# Patient Record
Sex: Female | Born: 1980 | Race: White | Hispanic: No | Marital: Single | Smoking: Never smoker
Health system: Southern US, Community
[De-identification: ages and names within clinical notes are randomized; demographics above are authoritative.]

## PROBLEM LIST (undated history)

## (undated) DIAGNOSIS — N200 Calculus of kidney: Secondary | ICD-10-CM

## (undated) DIAGNOSIS — T7840XA Allergy, unspecified, initial encounter: Secondary | ICD-10-CM

## (undated) DIAGNOSIS — N644 Mastodynia: Secondary | ICD-10-CM

## (undated) DIAGNOSIS — M797 Fibromyalgia: Secondary | ICD-10-CM

## (undated) DIAGNOSIS — F32A Depression, unspecified: Secondary | ICD-10-CM

## (undated) DIAGNOSIS — I82409 Acute embolism and thrombosis of unspecified deep veins of unspecified lower extremity: Secondary | ICD-10-CM

## (undated) DIAGNOSIS — G9050 Complex regional pain syndrome I, unspecified: Secondary | ICD-10-CM

## (undated) DIAGNOSIS — K859 Acute pancreatitis without necrosis or infection, unspecified: Secondary | ICD-10-CM

## (undated) DIAGNOSIS — F329 Major depressive disorder, single episode, unspecified: Secondary | ICD-10-CM

## (undated) DIAGNOSIS — M1990 Unspecified osteoarthritis, unspecified site: Secondary | ICD-10-CM

## (undated) DIAGNOSIS — F419 Anxiety disorder, unspecified: Secondary | ICD-10-CM

## (undated) DIAGNOSIS — R87612 Low grade squamous intraepithelial lesion on cytologic smear of cervix (LGSIL): Secondary | ICD-10-CM

## (undated) DIAGNOSIS — J45909 Unspecified asthma, uncomplicated: Secondary | ICD-10-CM

## (undated) DIAGNOSIS — K863 Pseudocyst of pancreas: Secondary | ICD-10-CM

## (undated) DIAGNOSIS — G709 Myoneural disorder, unspecified: Secondary | ICD-10-CM

## (undated) DIAGNOSIS — G43909 Migraine, unspecified, not intractable, without status migrainosus: Secondary | ICD-10-CM

## (undated) HISTORY — DX: Depression, unspecified: F32.A

## (undated) HISTORY — DX: Low grade squamous intraepithelial lesion on cytologic smear of cervix (LGSIL): R87.612

## (undated) HISTORY — PX: UPPER GASTROINTESTINAL ENDOSCOPY: SHX188

## (undated) HISTORY — DX: Myoneural disorder, unspecified: G70.9

## (undated) HISTORY — PX: OTHER SURGICAL HISTORY: SHX169

## (undated) HISTORY — PX: KNEE ARTHROSCOPY: SUR90

## (undated) HISTORY — PX: ERCP: SHX60

## (undated) HISTORY — PX: UPPER ESOPHAGEAL ENDOSCOPIC ULTRASOUND (EUS): SHX6562

## (undated) HISTORY — DX: Major depressive disorder, single episode, unspecified: F32.9

## (undated) HISTORY — DX: Allergy, unspecified, initial encounter: T78.40XA

## (undated) HISTORY — DX: Anxiety disorder, unspecified: F41.9

## (undated) HISTORY — PX: GASTROSTOMY-JEJEUNOSTOMY TUBE CHANGE/PLACEMENT: SHX1705

## (undated) HISTORY — DX: Migraine, unspecified, not intractable, without status migrainosus: G43.909

## (undated) HISTORY — DX: Unspecified asthma, uncomplicated: J45.909

## (undated) HISTORY — DX: Unspecified osteoarthritis, unspecified site: M19.90

## (undated) HISTORY — DX: Acute embolism and thrombosis of unspecified deep veins of unspecified lower extremity: I82.409

## (undated) HISTORY — PX: TONSILLECTOMY: SUR1361

---

## 2005-09-04 DIAGNOSIS — R87612 Low grade squamous intraepithelial lesion on cytologic smear of cervix (LGSIL): Secondary | ICD-10-CM

## 2005-09-04 HISTORY — DX: Low grade squamous intraepithelial lesion on cytologic smear of cervix (LGSIL): R87.612

## 2007-01-03 ENCOUNTER — Ambulatory Visit: Payer: Self-pay | Admitting: Otolaryngology

## 2007-01-25 ENCOUNTER — Emergency Department: Payer: Self-pay | Admitting: Emergency Medicine

## 2007-04-30 ENCOUNTER — Ambulatory Visit: Payer: Self-pay | Admitting: Internal Medicine

## 2007-07-12 ENCOUNTER — Other Ambulatory Visit: Payer: Self-pay

## 2007-07-12 ENCOUNTER — Emergency Department: Payer: Self-pay | Admitting: Unknown Physician Specialty

## 2007-08-27 ENCOUNTER — Ambulatory Visit: Payer: Self-pay | Admitting: Orthopaedic Surgery

## 2007-09-03 ENCOUNTER — Ambulatory Visit: Payer: Self-pay | Admitting: Orthopaedic Surgery

## 2007-10-30 ENCOUNTER — Ambulatory Visit: Payer: Self-pay | Admitting: Family Medicine

## 2007-11-03 ENCOUNTER — Ambulatory Visit: Payer: Self-pay | Admitting: Family Medicine

## 2007-12-04 ENCOUNTER — Ambulatory Visit: Payer: Self-pay | Admitting: Family Medicine

## 2008-01-03 ENCOUNTER — Ambulatory Visit: Payer: Self-pay | Admitting: Family Medicine

## 2008-02-03 ENCOUNTER — Ambulatory Visit: Payer: Self-pay | Admitting: Family Medicine

## 2008-02-19 ENCOUNTER — Ambulatory Visit: Payer: Self-pay | Admitting: Family Medicine

## 2008-03-04 ENCOUNTER — Ambulatory Visit: Payer: Self-pay | Admitting: Family Medicine

## 2008-03-04 ENCOUNTER — Ambulatory Visit: Payer: Self-pay | Admitting: Internal Medicine

## 2008-03-18 ENCOUNTER — Ambulatory Visit: Payer: Self-pay | Admitting: Internal Medicine

## 2008-04-04 ENCOUNTER — Ambulatory Visit: Payer: Self-pay | Admitting: Family Medicine

## 2008-04-04 ENCOUNTER — Ambulatory Visit: Payer: Self-pay | Admitting: Internal Medicine

## 2008-05-05 ENCOUNTER — Ambulatory Visit: Payer: Self-pay | Admitting: Internal Medicine

## 2008-06-21 ENCOUNTER — Ambulatory Visit: Payer: Self-pay | Admitting: Family Medicine

## 2008-08-24 ENCOUNTER — Ambulatory Visit: Payer: Self-pay | Admitting: Podiatry

## 2008-09-09 ENCOUNTER — Encounter: Payer: Self-pay | Admitting: Podiatry

## 2008-10-05 ENCOUNTER — Encounter: Payer: Self-pay | Admitting: Podiatry

## 2008-10-05 ENCOUNTER — Ambulatory Visit: Payer: Self-pay | Admitting: Internal Medicine

## 2008-10-13 ENCOUNTER — Ambulatory Visit: Payer: Self-pay | Admitting: Internal Medicine

## 2008-10-19 ENCOUNTER — Ambulatory Visit: Payer: Self-pay | Admitting: Unknown Physician Specialty

## 2008-11-02 ENCOUNTER — Ambulatory Visit: Payer: Self-pay | Admitting: Internal Medicine

## 2008-11-03 ENCOUNTER — Ambulatory Visit: Payer: Self-pay | Admitting: Rheumatology

## 2009-01-07 ENCOUNTER — Ambulatory Visit: Payer: Self-pay | Admitting: Neurology

## 2009-02-02 ENCOUNTER — Ambulatory Visit: Payer: Self-pay | Admitting: Internal Medicine

## 2009-02-19 ENCOUNTER — Ambulatory Visit: Payer: Self-pay | Admitting: Internal Medicine

## 2009-03-03 ENCOUNTER — Ambulatory Visit: Payer: Self-pay | Admitting: Specialist

## 2009-03-04 ENCOUNTER — Ambulatory Visit: Payer: Self-pay | Admitting: Internal Medicine

## 2009-05-05 ENCOUNTER — Ambulatory Visit: Payer: Self-pay | Admitting: Internal Medicine

## 2009-06-03 ENCOUNTER — Ambulatory Visit: Payer: Self-pay | Admitting: Internal Medicine

## 2009-06-04 ENCOUNTER — Ambulatory Visit: Payer: Self-pay | Admitting: Internal Medicine

## 2009-07-05 ENCOUNTER — Ambulatory Visit: Payer: Self-pay | Admitting: Internal Medicine

## 2009-08-04 ENCOUNTER — Ambulatory Visit: Payer: Self-pay | Admitting: Internal Medicine

## 2009-09-02 ENCOUNTER — Ambulatory Visit: Payer: Self-pay | Admitting: Internal Medicine

## 2009-09-04 ENCOUNTER — Ambulatory Visit: Payer: Self-pay | Admitting: Internal Medicine

## 2009-11-30 ENCOUNTER — Ambulatory Visit: Payer: Self-pay

## 2009-12-08 ENCOUNTER — Encounter: Payer: Self-pay | Admitting: Internal Medicine

## 2010-01-02 ENCOUNTER — Encounter: Payer: Self-pay | Admitting: Internal Medicine

## 2010-01-04 ENCOUNTER — Ambulatory Visit: Payer: Self-pay | Admitting: Internal Medicine

## 2010-02-02 ENCOUNTER — Encounter: Payer: Self-pay | Admitting: Internal Medicine

## 2010-03-04 ENCOUNTER — Ambulatory Visit: Payer: Self-pay | Admitting: Internal Medicine

## 2010-03-04 ENCOUNTER — Encounter: Payer: Self-pay | Admitting: Internal Medicine

## 2010-03-16 ENCOUNTER — Ambulatory Visit: Payer: Self-pay | Admitting: Internal Medicine

## 2010-04-04 ENCOUNTER — Ambulatory Visit: Payer: Self-pay | Admitting: Internal Medicine

## 2010-04-04 ENCOUNTER — Encounter: Payer: Self-pay | Admitting: Internal Medicine

## 2010-04-21 ENCOUNTER — Ambulatory Visit: Payer: Self-pay | Admitting: Internal Medicine

## 2010-05-02 ENCOUNTER — Encounter: Payer: Self-pay | Admitting: Internal Medicine

## 2010-05-05 ENCOUNTER — Encounter: Payer: Self-pay | Admitting: Internal Medicine

## 2010-06-02 ENCOUNTER — Ambulatory Visit: Payer: Self-pay | Admitting: Internal Medicine

## 2010-08-18 ENCOUNTER — Ambulatory Visit: Payer: Self-pay | Admitting: Internal Medicine

## 2010-10-04 ENCOUNTER — Encounter: Payer: Self-pay | Admitting: Internal Medicine

## 2010-10-05 ENCOUNTER — Encounter: Payer: Self-pay | Admitting: Internal Medicine

## 2010-10-19 ENCOUNTER — Ambulatory Visit: Payer: Self-pay | Admitting: Internal Medicine

## 2010-11-03 ENCOUNTER — Ambulatory Visit: Payer: Self-pay | Admitting: Internal Medicine

## 2010-11-03 ENCOUNTER — Encounter: Payer: Self-pay | Admitting: Internal Medicine

## 2011-01-16 ENCOUNTER — Ambulatory Visit: Payer: Self-pay | Admitting: Family Medicine

## 2011-02-01 ENCOUNTER — Ambulatory Visit: Payer: Self-pay | Admitting: Family

## 2011-02-10 ENCOUNTER — Ambulatory Visit: Payer: Self-pay | Admitting: Internal Medicine

## 2011-02-13 ENCOUNTER — Ambulatory Visit: Payer: Self-pay | Admitting: Internal Medicine

## 2011-03-08 ENCOUNTER — Ambulatory Visit: Payer: Self-pay | Admitting: Family Medicine

## 2011-05-17 ENCOUNTER — Ambulatory Visit: Payer: Self-pay | Admitting: Family Medicine

## 2011-09-18 DIAGNOSIS — M545 Low back pain, unspecified: Secondary | ICD-10-CM | POA: Insufficient documentation

## 2011-09-18 DIAGNOSIS — M797 Fibromyalgia: Secondary | ICD-10-CM | POA: Insufficient documentation

## 2011-10-02 ENCOUNTER — Ambulatory Visit: Payer: Self-pay | Admitting: Internal Medicine

## 2011-10-02 LAB — URINALYSIS, COMPLETE
Bilirubin,UR: NEGATIVE
Blood: NEGATIVE
Glucose,UR: NEGATIVE mg/dL (ref 0–75)
Leukocyte Esterase: NEGATIVE
Nitrite: NEGATIVE
Protein: NEGATIVE
RBC,UR: NONE SEEN /HPF (ref 0–5)
Specific Gravity: >=1.03 (ref 1.003–1.030)

## 2011-10-04 LAB — URINE CULTURE

## 2011-11-07 ENCOUNTER — Ambulatory Visit: Payer: Self-pay | Admitting: Family Medicine

## 2011-11-30 ENCOUNTER — Ambulatory Visit: Payer: Self-pay | Admitting: Internal Medicine

## 2011-11-30 LAB — CBC WITH DIFFERENTIAL/PLATELET
Basophil #: 0.1 10*3/uL (ref 0.0–0.1)
Basophil %: 0.5 %
HGB: 12.7 g/dL (ref 12.0–16.0)
Monocyte #: 0.6 10*3/uL (ref 0.0–0.7)
Platelet: 392 10*3/uL (ref 150–440)
RBC: 4.12 10*6/uL (ref 3.80–5.20)
RDW: 12.4 % (ref 11.5–14.5)

## 2011-11-30 LAB — CREATININE, SERUM
Creatinine: 0.71 mg/dL (ref 0.60–1.30)
EGFR (Non-African Amer.): >60

## 2011-12-01 DIAGNOSIS — M2550 Pain in unspecified joint: Secondary | ICD-10-CM | POA: Insufficient documentation

## 2012-09-12 ENCOUNTER — Emergency Department: Payer: Self-pay | Admitting: Emergency Medicine

## 2012-09-12 LAB — BASIC METABOLIC PANEL
Calcium, Total: 8.5 mg/dL (ref 8.5–10.1)
Co2: 19 mmol/L — ABNORMAL LOW (ref 21–32)
Creatinine: 0.67 mg/dL (ref 0.60–1.30)
EGFR (Non-African Amer.): >60
Glucose: 91 mg/dL (ref 65–99)
Osmolality: 279 (ref 275–301)
Potassium: 4.1 mmol/L (ref 3.5–5.1)

## 2012-09-12 LAB — CBC
HCT: 38.7 % (ref 35.0–47.0)
MCHC: 32.4 g/dL (ref 32.0–36.0)
MCV: 94 fL (ref 80–100)
RDW: 13.3 % (ref 11.5–14.5)

## 2012-09-17 ENCOUNTER — Emergency Department: Payer: Self-pay | Admitting: Emergency Medicine

## 2012-09-17 LAB — URINALYSIS, COMPLETE
Bacteria: NONE SEEN
Bilirubin,UR: NEGATIVE
Blood: NEGATIVE
Glucose,UR: NEGATIVE mg/dL (ref 0–75)
Hyaline Cast: 1
Nitrite: NEGATIVE
Protein: NEGATIVE
RBC,UR: 1 /HPF (ref 0–5)
Specific Gravity: 1.021 (ref 1.003–1.030)
Squamous Epithelial: 1

## 2012-09-17 LAB — COMPREHENSIVE METABOLIC PANEL
Albumin: 3.8 g/dL (ref 3.4–5.0)
Anion Gap: 9 (ref 7–16)
BUN: 13 mg/dL (ref 7–18)
Calcium, Total: 8.9 mg/dL (ref 8.5–10.1)
Chloride: 110 mmol/L — ABNORMAL HIGH (ref 98–107)
Co2: 20 mmol/L — ABNORMAL LOW (ref 21–32)
Osmolality: 277 (ref 275–301)
Potassium: 4 mmol/L (ref 3.5–5.1)
SGOT(AST): 7 U/L — ABNORMAL LOW (ref 15–37)
SGPT (ALT): 15 U/L (ref 12–78)
Sodium: 139 mmol/L (ref 136–145)
Total Protein: 7.9 g/dL (ref 6.4–8.2)

## 2012-09-17 LAB — LIPASE, BLOOD: Lipase: 119 U/L (ref 73–393)

## 2013-07-12 ENCOUNTER — Emergency Department: Payer: Self-pay | Admitting: Emergency Medicine

## 2013-07-12 LAB — URINALYSIS, COMPLETE
Bilirubin,UR: NEGATIVE
Blood: NEGATIVE
Glucose,UR: NEGATIVE mg/dL (ref 0–75)
Ketone: NEGATIVE
Protein: NEGATIVE
RBC,UR: 1 /HPF (ref 0–5)

## 2013-07-12 LAB — BASIC METABOLIC PANEL
BUN: 11 mg/dL (ref 7–18)
Calcium, Total: 8.7 mg/dL (ref 8.5–10.1)
Co2: 21 mmol/L (ref 21–32)
Creatinine: 0.68 mg/dL (ref 0.60–1.30)
EGFR (African American): >60
EGFR (Non-African Amer.): >60
Glucose: 92 mg/dL (ref 65–99)
Osmolality: 277 (ref 275–301)
Sodium: 139 mmol/L (ref 136–145)

## 2013-07-12 LAB — CBC
HCT: 38.2 % (ref 35.0–47.0)
MCH: 31.6 pg (ref 26.0–34.0)
Platelet: 325 10*3/uL (ref 150–440)
RDW: 13.7 % (ref 11.5–14.5)

## 2013-08-21 ENCOUNTER — Emergency Department: Payer: Self-pay | Admitting: Emergency Medicine

## 2013-08-28 ENCOUNTER — Emergency Department: Payer: Self-pay | Admitting: Internal Medicine

## 2013-09-18 ENCOUNTER — Emergency Department: Payer: Self-pay | Admitting: Emergency Medicine

## 2014-01-15 ENCOUNTER — Emergency Department: Payer: Self-pay | Admitting: Emergency Medicine

## 2014-01-15 LAB — BASIC METABOLIC PANEL
Anion Gap: 8 (ref 7–16)
BUN: 12 mg/dL (ref 7–18)
CALCIUM: 8.8 mg/dL (ref 8.5–10.1)
CHLORIDE: 107 mmol/L (ref 98–107)
Co2: 24 mmol/L (ref 21–32)
Creatinine: 0.72 mg/dL (ref 0.60–1.30)
EGFR (Non-African Amer.): >60
GLUCOSE: 117 mg/dL — AB (ref 65–99)
Osmolality: 278 (ref 275–301)
POTASSIUM: 4 mmol/L (ref 3.5–5.1)
Sodium: 139 mmol/L (ref 136–145)

## 2014-01-15 LAB — CBC
HCT: 36.7 % (ref 35.0–47.0)
HGB: 12.3 g/dL (ref 12.0–16.0)
MCH: 31.3 pg (ref 26.0–34.0)
MCHC: 33.5 g/dL (ref 32.0–36.0)
MCV: 93 fL (ref 80–100)
Platelet: 360 10*3/uL (ref 150–440)
RBC: 3.94 10*6/uL (ref 3.80–5.20)
RDW: 12.7 % (ref 11.5–14.5)
WBC: 16.7 10*3/uL — ABNORMAL HIGH (ref 3.6–11.0)

## 2014-01-15 LAB — TROPONIN I

## 2014-02-04 ENCOUNTER — Emergency Department: Payer: Self-pay | Admitting: Emergency Medicine

## 2014-06-11 ENCOUNTER — Ambulatory Visit: Payer: Self-pay

## 2014-09-03 ENCOUNTER — Emergency Department: Payer: Self-pay | Admitting: Emergency Medicine

## 2014-09-03 LAB — URINALYSIS, COMPLETE
BILIRUBIN, UR: NEGATIVE
BLOOD: NEGATIVE
Bacteria: NONE SEEN
Glucose,UR: NEGATIVE mg/dL (ref 0–75)
KETONE: NEGATIVE
LEUKOCYTE ESTERASE: NEGATIVE
NITRITE: NEGATIVE
Ph: 5 (ref 4.5–8.0)
Protein: 30
Specific Gravity: 1.026 (ref 1.003–1.030)
Squamous Epithelial: 6
WBC UR: 6 /HPF (ref 0–5)

## 2014-09-03 LAB — COMPREHENSIVE METABOLIC PANEL
ALK PHOS: 88 U/L
AST: 9 U/L — AB (ref 15–37)
Albumin: 4 g/dL (ref 3.4–5.0)
Anion Gap: 10 (ref 7–16)
BILIRUBIN TOTAL: 0.3 mg/dL (ref 0.2–1.0)
BUN: 14 mg/dL (ref 7–18)
CHLORIDE: 105 mmol/L (ref 98–107)
Calcium, Total: 8.4 mg/dL — ABNORMAL LOW (ref 8.5–10.1)
Co2: 23 mmol/L (ref 21–32)
Creatinine: 0.81 mg/dL (ref 0.60–1.30)
EGFR (African American): >60
EGFR (Non-African Amer.): >60
GLUCOSE: 108 mg/dL — AB (ref 65–99)
OSMOLALITY: 277 (ref 275–301)
Potassium: 3.8 mmol/L (ref 3.5–5.1)
SGPT (ALT): 20 U/L
Sodium: 138 mmol/L (ref 136–145)
Total Protein: 8.1 g/dL (ref 6.4–8.2)

## 2014-09-03 LAB — CBC WITH DIFFERENTIAL/PLATELET
BASOS PCT: 0.6 %
Basophil #: 0.1 10*3/uL (ref 0.0–0.1)
Eosinophil #: 0.3 10*3/uL (ref 0.0–0.7)
Eosinophil %: 1.9 %
HCT: 40.4 % (ref 35.0–47.0)
HGB: 13.3 g/dL (ref 12.0–16.0)
LYMPHS ABS: 3.7 10*3/uL — AB (ref 1.0–3.6)
LYMPHS PCT: 20.8 %
MCH: 31 pg (ref 26.0–34.0)
MCHC: 32.9 g/dL (ref 32.0–36.0)
MCV: 94 fL (ref 80–100)
MONOS PCT: 5.6 %
Monocyte #: 1 x10 3/mm — ABNORMAL HIGH (ref 0.2–0.9)
NEUTROS ABS: 12.7 10*3/uL — AB (ref 1.4–6.5)
Neutrophil %: 71.1 %
Platelet: 413 10*3/uL (ref 150–440)
RBC: 4.29 10*6/uL (ref 3.80–5.20)
RDW: 12.7 % (ref 11.5–14.5)
WBC: 17.8 10*3/uL — ABNORMAL HIGH (ref 3.6–11.0)

## 2014-09-03 LAB — PREGNANCY, URINE: PREGNANCY TEST, URINE: NEGATIVE m[IU]/mL

## 2014-09-03 LAB — LIPASE, BLOOD: Lipase: 172 U/L (ref 73–393)

## 2015-05-21 DIAGNOSIS — J45909 Unspecified asthma, uncomplicated: Secondary | ICD-10-CM | POA: Insufficient documentation

## 2015-07-20 ENCOUNTER — Other Ambulatory Visit: Payer: Self-pay | Admitting: Adult Health

## 2015-07-20 DIAGNOSIS — N644 Mastodynia: Secondary | ICD-10-CM

## 2015-07-23 ENCOUNTER — Ambulatory Visit
Admission: RE | Admit: 2015-07-23 | Discharge: 2015-07-23 | Disposition: A | Discharge status: Home / Self Care | Payer: Self-pay | Source: Ambulatory Visit | Attending: Family Medicine | Admitting: Family Medicine

## 2015-07-23 DIAGNOSIS — N644 Mastodynia: Secondary | ICD-10-CM

## 2015-07-23 HISTORY — DX: Mastodynia: N64.4

## 2015-08-25 ENCOUNTER — Emergency Department
Admission: EM | Admit: 2015-08-25 | Discharge: 2015-08-25 | Disposition: A | Discharge status: Home / Self Care | Payer: Self-pay | Attending: Emergency Medicine | Admitting: Emergency Medicine

## 2015-08-25 DIAGNOSIS — Z3202 Encounter for pregnancy test, result negative: Secondary | ICD-10-CM | POA: Insufficient documentation

## 2015-08-25 DIAGNOSIS — Z880 Allergy status to penicillin: Secondary | ICD-10-CM | POA: Insufficient documentation

## 2015-08-25 DIAGNOSIS — R197 Diarrhea, unspecified: Secondary | ICD-10-CM

## 2015-08-25 DIAGNOSIS — A084 Viral intestinal infection, unspecified: Secondary | ICD-10-CM | POA: Insufficient documentation

## 2015-08-25 LAB — URINALYSIS COMPLETE WITH MICROSCOPIC (ARMC ONLY)
BILIRUBIN URINE: NEGATIVE
Bacteria, UA: NONE SEEN
GLUCOSE, UA: NEGATIVE mg/dL
Hgb urine dipstick: NEGATIVE
Ketones, ur: NEGATIVE mg/dL
Nitrite: NEGATIVE
Protein, ur: NEGATIVE mg/dL
Specific Gravity, Urine: 1.024 (ref 1.005–1.030)
pH: 5 (ref 5.0–8.0)

## 2015-08-25 LAB — CBC
HCT: 38.6 % (ref 35.0–47.0)
Hemoglobin: 12.6 g/dL (ref 12.0–16.0)
MCH: 30.3 pg (ref 26.0–34.0)
MCHC: 32.7 g/dL (ref 32.0–36.0)
MCV: 92.6 fL (ref 80.0–100.0)
PLATELETS: 377 10*3/uL (ref 150–440)
RBC: 4.17 MIL/uL (ref 3.80–5.20)
RDW: 13.3 % (ref 11.5–14.5)
WBC: 14.3 10*3/uL — ABNORMAL HIGH (ref 3.6–11.0)

## 2015-08-25 LAB — COMPREHENSIVE METABOLIC PANEL
ALBUMIN: 4.1 g/dL (ref 3.5–5.0)
ALT: 17 U/L (ref 14–54)
AST: 15 U/L (ref 15–41)
Alkaline Phosphatase: 76 U/L (ref 38–126)
Anion gap: 5 (ref 5–15)
BUN: 13 mg/dL (ref 6–20)
CALCIUM: 9 mg/dL (ref 8.9–10.3)
CO2: 21 mmol/L — AB (ref 22–32)
CREATININE: 0.62 mg/dL (ref 0.44–1.00)
Chloride: 110 mmol/L (ref 101–111)
GFR calc non Af Amer: >60 mL/min (ref >60)
Glucose, Bld: 90 mg/dL (ref 65–99)
Potassium: 3.7 mmol/L (ref 3.5–5.1)
SODIUM: 136 mmol/L (ref 135–145)
Total Bilirubin: 0.3 mg/dL (ref 0.3–1.2)
Total Protein: 7.6 g/dL (ref 6.5–8.1)

## 2015-08-25 LAB — POCT PREGNANCY, URINE: PREG TEST UR: NEGATIVE

## 2015-08-25 LAB — LIPASE, BLOOD: LIPASE: 29 U/L (ref 11–51)

## 2015-08-25 MED ORDER — DICYCLOMINE HCL 20 MG PO TABS: 20.0000 mg | ORAL_TABLET | Freq: Three times a day (TID) | ORAL | Status: DC | PRN

## 2015-08-25 MED ORDER — ONDANSETRON 8 MG PO TBDP: 8.0000 mg | ORAL_TABLET | Freq: Three times a day (TID) | ORAL | Status: DC | PRN

## 2015-08-25 MED ORDER — RANITIDINE HCL 150 MG PO CAPS: 150.0000 mg | ORAL_CAPSULE | Freq: Two times a day (BID) | ORAL | Status: DC

## 2015-08-25 NOTE — ED Notes (Signed)
Pt c/o watery diarrhea since Saturday night with whole left side abd cramping.

## 2015-08-25 NOTE — Discharge Instructions (Signed)
Diarrhea Diarrhea is frequent loose and watery bowel movements. It can cause you to feel weak and dehydrated. Dehydration can cause you to become tired and thirsty, have a dry mouth, and have decreased urination that often is dark yellow. Diarrhea is a sign of another problem, most often an infection that will not last long. In most cases, diarrhea typically lasts 2-3 days. However, it can last longer if it is a sign of something more serious. It is important to treat your diarrhea as directed by your caregiver to lessen or prevent future episodes of diarrhea. CAUSES  Some common causes include:  Gastrointestinal infections caused by viruses, bacteria, or parasites.  Food poisoning or food allergies.  Certain medicines, such as antibiotics, chemotherapy, and laxatives.  Artificial sweeteners and fructose.  Digestive disorders. HOME CARE INSTRUCTIONS  Ensure adequate fluid intake (hydration): Have 1 cup (8 oz) of fluid for each diarrhea episode. Avoid fluids that contain simple sugars or sports drinks, fruit juices, whole milk products, and sodas. Your urine should be clear or pale yellow if you are drinking enough fluids. Hydrate with an oral rehydration solution that you can purchase at pharmacies, retail stores, and online. You can prepare an oral rehydration solution at home by mixing the following ingredients together:   - tsp table salt.   tsp baking soda.   tsp salt substitute containing potassium chloride.  1  tablespoons sugar.  1 L (34 oz) of water.  Certain foods and beverages may increase the speed at which food moves through the gastrointestinal (GI) tract. These foods and beverages should be avoided and include:  Caffeinated and alcoholic beverages.  High-fiber foods, such as raw fruits and vegetables, nuts, seeds, and whole grain breads and cereals.  Foods and beverages sweetened with sugar alcohols, such as xylitol, sorbitol, and mannitol.  Some foods may be well  tolerated and may help thicken stool including:  Starchy foods, such as rice, toast, pasta, low-sugar cereal, oatmeal, grits, baked potatoes, crackers, and bagels.  Bananas.  Applesauce.  Add probiotic-rich foods to help increase healthy bacteria in the GI tract, such as yogurt and fermented milk products.  Wash your hands well after each diarrhea episode.  Only take over-the-counter or prescription medicines as directed by your caregiver.  Take a warm bath to relieve any burning or pain from frequent diarrhea episodes. SEEK IMMEDIATE MEDICAL CARE IF:   You are unable to keep fluids down.  You have persistent vomiting.  You have blood in your stool, or your stools are black and tarry.  You do not urinate in 6-8 hours, or there is only a small amount of very dark urine.  You have abdominal pain that increases or localizes.  You have weakness, dizziness, confusion, or light-headedness.  You have a severe headache.  Your diarrhea gets worse or does not get better.  You have a fever or persistent symptoms for more than 2-3 days.  You have a fever and your symptoms suddenly get worse. MAKE SURE YOU:   Understand these instructions.  Will watch your condition.  Will get help right away if you are not doing well or get worse.   This information is not intended to replace advice given to you by your health care provider. Make sure you discuss any questions you have with your health care provider.   Document Released: 08/11/2002 Document Revised: 09/11/2014 Document Reviewed: 04/28/2012 Elsevier Interactive Patient Education 2016 [employer] Inc[employer]  Viral Gastroenteritis Viral gastroenteritis is also known as stomach flu. This  condition affects the stomach and intestinal tract. It can cause sudden diarrhea and vomiting. The illness typically lasts 3 to 8 days. Most people develop an immune response that eventually gets rid of the virus. While this natural response develops, the  virus can make you quite ill. CAUSES  Many different viruses can cause gastroenteritis, such as rotavirus or noroviruses. You can catch one of these viruses by consuming contaminated food or water. You may also catch a virus by sharing utensils or other personal items with an infected person or by touching a contaminated surface. SYMPTOMS  The most common symptoms are diarrhea and vomiting. These problems can cause a severe loss of body fluids (dehydration) and a body salt (electrolyte) imbalance. Other symptoms may include:  Fever.  Headache.  Fatigue.  Abdominal pain. DIAGNOSIS  Your caregiver can usually diagnose viral gastroenteritis based on your symptoms and a physical exam. A stool sample may also be taken to test for the presence of viruses or other infections. TREATMENT  This illness typically goes away on its own. Treatments are aimed at rehydration. The most serious cases of viral gastroenteritis involve vomiting so severely that you are not able to keep fluids down. In these cases, fluids must be given through an intravenous line (IV). HOME CARE INSTRUCTIONS   Drink enough fluids to keep your urine clear or pale yellow. Drink small amounts of fluids frequently and increase the amounts as tolerated.  Ask your caregiver for specific rehydration instructions.  Avoid:  Foods high in sugar.  Alcohol.  Carbonated drinks.  Tobacco.  Juice.  Caffeine drinks.  Extremely hot or cold fluids.  Fatty, greasy foods.  Too much intake of anything at one time.  Dairy products until 24 to 48 hours after diarrhea stops.  You may consume probiotics. Probiotics are active cultures of beneficial bacteria. They may lessen the amount and number of diarrheal stools in adults. Probiotics can be found in yogurt with active cultures and in supplements.  Wash your hands well to avoid spreading the virus.  Only take over-the-counter or prescription medicines for pain, discomfort, or  fever as directed by your caregiver. Do not give aspirin to children. Antidiarrheal medicines are not recommended.  Ask your caregiver if you should continue to take your regular prescribed and over-the-counter medicines.  Keep all follow-up appointments as directed by your caregiver. SEEK IMMEDIATE MEDICAL CARE IF:   You are unable to keep fluids down.  You do not urinate at least once every 6 to 8 hours.  You develop shortness of breath.  You notice blood in your stool or vomit. This may look like coffee grounds.  You have abdominal pain that increases or is concentrated in one small area (localized).  You have persistent vomiting or diarrhea.  You have a fever.  The patient is a child younger than 3 months, and he or she has a fever.  The patient is a child older than 3 months, and he or she has a fever and persistent symptoms.  The patient is a child older than 3 months, and he or she has a fever and symptoms suddenly get worse.  The patient is a baby, and he or she has no tears when crying. MAKE SURE YOU:   Understand these instructions.  Will watch your condition.  Will get help right away if you are not doing well or get worse.   This information is not intended to replace advice given to you by your health care provider. Make  sure you discuss any questions you have with your health care provider.   Document Released: 08/21/2005 Document Revised: 11/13/2011 Document Reviewed: 06/07/2011 Elsevier Interactive Patient Education [employer]

## 2015-08-25 NOTE — ED Provider Notes (Signed)
[facility] Emergency Department Provider Note  ____________________________________________  Time seen: 4:50 PM  I have reviewed the triage vital signs and the nursing notes.   HISTORY  Chief Complaint Diarrhea    HPI Kelsey Bell is a 34 y.o. female who complains of watery diarrhea for the past 5 days. Also generalized abdominal cramping worse on the left side. Has some nausea but no vomiting. No fevers chills chest pain shortness of breath. No significant travel or recent illness, no sick contacts. Has taken some antibiotics recently for an ear infection but last antibiotic use was more than a month ago. No recent hospitalizations.     Past Medical History  Diagnosis Date  . Breast pain present for several months    Bil LT >RT across of breasts     There are no active problems to display for this patient.    Past Surgical History  Procedure Laterality Date  . Tonsillectomy    . Knee arthroscopy       Current Outpatient Rx  Name  Route  Sig  Dispense  Refill  . dicyclomine (BENTYL) 20 MG tablet   Oral   Take 1 tablet (20 mg total) by mouth 3 (three) times daily as needed for spasms.   30 tablet   0   . ondansetron (ZOFRAN ODT) 8 MG disintegrating tablet   Oral   Take 1 tablet (8 mg total) by mouth every 8 (eight) hours as needed for nausea or vomiting.   20 tablet   0   . ranitidine (ZANTAC) 150 MG capsule   Oral   Take 1 capsule (150 mg total) by mouth 2 (two) times daily.   28 capsule   0      Allergies Esomeprazole; Hydroxychloroquine; Cefdinir; Prednisone; Amoxicillin; and Sulfa antibiotics   No family history on file.  Social History Social History  Substance Use Topics  . Smoking status: Never Smoker   . Smokeless tobacco: None  . Alcohol Use: No    Review of Systems  Constitutional:   No fever or chills. No weight changes Eyes:   No blurry vision or double vision.  ENT:   No sore  throat. Cardiovascular:   No chest pain. Respiratory:   No dyspnea or cough. Gastrointestinal:   Positive for abdominal pain with diarrhea. No vomiting.Marland Kitchen  No BRBPR or melena. Genitourinary:   Negative for dysuria, urinary retention, bloody urine, or difficulty urinating. Musculoskeletal:   Negative for back pain. No joint swelling or pain. Skin:   Negative for rash. Neurological:   Negative for headaches, focal weakness or numbness. Psychiatric:  No anxiety or depression.   Endocrine:  No hot/cold intolerance, changes in energy, or sleep difficulty.  10-point ROS otherwise negative.  ____________________________________________   PHYSICAL EXAM:  VITAL SIGNS: ED Triage Vitals  Enc Vitals Group     BP 08/25/15 1619 123/74 mmHg     Pulse Rate 08/25/15 1619 88     Resp 08/25/15 1619 18     Temp 08/25/15 1619 98.1 F (36.7 C)     Temp Source 08/25/15 1619 Oral     SpO2 08/25/15 1619 98 %     Weight 08/25/15 1619 280 lb (127.007 kg)     Height 08/25/15 1619  (1.753 m)     Head Cir --      Peak Flow --      Pain Score 08/25/15 1620 6     Pain Loc --  Pain Edu? --      Excl. in GC? --     Vital signs reviewed, nursing assessments reviewed.   Constitutional:   Alert and oriented. Well appearing and in no distress. Eyes:   No scleral icterus. No conjunctival pallor. PERRL. EOMI ENT   Head:   Normocephalic and atraumatic.   Nose:   No congestion/rhinnorhea. No septal hematoma   Mouth/Throat:   MMM, no pharyngeal erythema. No peritonsillar mass. No uvula shift.   Neck:   No stridor. No SubQ emphysema. No meningismus. Hematological/Lymphatic/Immunilogical:   No cervical lymphadenopathy. Cardiovascular:   RRR. Normal and symmetric distal pulses are present in all extremities. No murmurs, rubs, or gallops. Respiratory:   Normal respiratory effort without tachypnea nor retractions. Breath sounds are clear and equal bilaterally. No  wheezes/rales/rhonchi. Gastrointestinal:   Soft with generalized mild tenderness, nontender on the right side. No distention. There is no CVA tenderness.  No rebound, rigidity, or guarding. Genitourinary:   deferred Musculoskeletal:   Nontender with normal range of motion in all extremities. No joint effusions.  No lower extremity tenderness.  No edema. Neurologic:   Normal speech and language.  CN 2-10 normal. Motor grossly intact. No pronator drift.  Normal gait. No gross focal neurologic deficits are appreciated.  Skin:    Skin is warm, dry and intact. No rash noted.  No petechiae, purpura, or bullae. Psychiatric:   Mood and affect are normal. Speech and behavior are normal. Patient exhibits appropriate insight and judgment.  ____________________________________________    LABS (pertinent positives/negatives) (all labs ordered are listed, but only abnormal results are displayed) Labs Reviewed  COMPREHENSIVE METABOLIC PANEL - Abnormal; Notable for the following:    CO2 21 (*)    All other components within normal limits  CBC - Abnormal; Notable for the following:    WBC 14.3 (*)    All other components within normal limits  URINALYSIS COMPLETEWITH MICROSCOPIC ([facility] ONLY) - Abnormal; Notable for the following:    Color, Urine YELLOW (*)    APPearance CLEAR (*)    Leukocytes, UA TRACE (*)    Squamous Epithelial / LPF 0-5 (*)    All other components within normal limits  LIPASE, BLOOD  POC URINE PREG, ED  POCT PREGNANCY, URINE   ____________________________________________   EKG    ____________________________________________    RADIOLOGY    ____________________________________________   PROCEDURES   ____________________________________________   INITIAL IMPRESSION / ASSESSMENT AND PLAN / ED COURSE  Pertinent labs & imaging results that were available during my care of the patient were reviewed by me and considered in my medical decision making (see chart  for details).  Patient presents with watery diarrhea, well-appearing nontoxic. Low suspicion for cholecystitis or appendicitis. No evidence of perforation or obstruction. Low suspicion for C. difficile. We will provide supportive care with an acid, antiemetics, antispasmodic. Focus on oral hydration, patient is tolerating oral intake and has Pedialyte at home that she bought for this. We'll discharge, follow-up with primary care.     ____________________________________________   FINAL CLINICAL IMPRESSION(S) / ED DIAGNOSES  Final diagnoses:  Diarrhea of presumed infectious origin  Viral enteritis      Sharman CheekPhillip Britley Gashi, MD 08/25/15 1728

## 2015-09-05 HISTORY — PX: COLPOSCOPY: SHX161

## 2016-02-22 ENCOUNTER — Ambulatory Visit: Payer: Worker's Compensation | Attending: Podiatry | Admitting: Physical Therapy

## 2016-02-22 DIAGNOSIS — M6281 Muscle weakness (generalized): Secondary | ICD-10-CM | POA: Diagnosis present

## 2016-02-22 DIAGNOSIS — M79672 Pain in left foot: Secondary | ICD-10-CM | POA: Diagnosis not present

## 2016-02-22 DIAGNOSIS — R262 Difficulty in walking, not elsewhere classified: Secondary | ICD-10-CM | POA: Diagnosis present

## 2016-02-22 DIAGNOSIS — M25572 Pain in left ankle and joints of left foot: Secondary | ICD-10-CM | POA: Insufficient documentation

## 2016-02-23 ENCOUNTER — Encounter: Payer: Self-pay | Admitting: Physical Therapy

## 2016-02-23 NOTE — Therapy (Signed)
[facility] [address] [place], [place], [address] Phone: [phone]   Fax:  [phone]  Physical Therapy Evaluation  Patient Details  Name: Kelsey Bell MRN: [id number] Date of Birth: [birth year] Referring Provider: Dr. Fransisca Kaufmann  Encounter Date: 02/22/2016      PT End of Session - 02/23/16 0955    Visit Number 1   Number of Visits 6   Date for PT Re-Evaluation 03/09/16   PT Start Time 1003   PT Stop Time 1111   PT Time Calculation (min) 68 min   Activity Tolerance Patient limited by pain   Behavior During Therapy [facility] for tasks assessed/performed      Past Medical History  Diagnosis Date  . Breast pain present for several months    Bil LT >RT across of breasts    Past Surgical History  Procedure Laterality Date  . Tonsillectomy    . Knee arthroscopy      There were no vitals filed for this visit.       Subjective Assessment - 02/23/16 0934    Subjective Pt. 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  Pt. states accident happened on 01/20/16 and she continued to work but pain became worse requiring her to go to Urgent Care the next day.  Pt. states X-ray was negative for fracture but she has nerve damage.  Pt. reports pain in L foot "feels like constant poking".  MD f/u with Dr. Orland Jarred is next Wednesday.     Limitations Standing;Walking;House hold activities;Other (comment)   Patient Stated Goals Increase L ankle/toe AROM and pain-free mobility to promote return to work.     Currently in Pain? Yes   Pain Score 8    Pain Location Foot   Pain Orientation Left   Pain Descriptors / Indicators Constant;Burning;Radiating;Stabbing   Pain Type Acute pain   Pain Onset More than a month ago   Pain Frequency Constant   Aggravating Factors  everything            [facility] PT Assessment - 02/23/16 0001    Assessment   Medical Diagnosis L foot pain   Referring Provider  Dr. Fransisca Kaufmann   Onset Date/Surgical Date 01/20/16   Next MD Visit --  03/01/16   Prior Therapy Pt. has not had PT for L foot.    Restrictions   Weight Bearing Restrictions No       Manual tx.: L ankle/toe AA/PROM (all planes of movement)- 8 min.  STM to L foot (as tolerated).  Biofreeze to L foot after tx.           PT Education - 02/23/16 0953    Education provided Yes   Education Details See HEP.  Encouraged pt. to stay active and move L toes/ankle.  PT recommended pt. to bring both sneakers next tx. session.    Person(s) Educated Patient   Methods Explanation;Demonstration;Handout   Comprehension Verbalized understanding;Returned demonstration;Need further instruction             PT Long Term Goals - 02/23/16 1008    PT LONG TERM GOAL #1   Title Pt. I with HEP to increase L ankle/toe AROM to WNL as compared to R ankle to improve pain-free mobility.     Baseline difficulty attempting to measure L ankle/toe due to c/o severe pain (pt. became very labile).     Time 4   Period Weeks  Status New   PT LONG TERM GOAL #2   Title Pt. will increase LEFS to >40 out of 80 to improve pain-free mobility/ return to work.     Baseline LEFS: 17 out of 80.     Time 4   Period Weeks   Status New   PT LONG TERM GOAL #3   Title Pt. able to ambulate 10 minutes with normalized gait pattern and use of sneakers with no limitations to promote improved mobility.     Baseline L CAM boot with moderate antalgic gait/ increase c/o pain.     Time 4   Period Weeks   Status New   PT LONG TERM GOAL #4   Title Pt. able to return to work with no L ankle pain/limitations.     Baseline currently out on work comp.     Time 4   Period Weeks   Status New               Plan - 02/23/16 0956    Clinical Impression Statement Pt. is a 35 y/o female with c/o L foot/toe pain after gallon of tea fell on foot on 01/20/16.  Pt. reprots 8/10 L foot/toe pain currently at rest prior to tx. session and  6/10 pain at best (meds./ ice).  Pt. presents to PT with use of L CAM walking boot and limited step pattern/ L knee flexion due to pain and fear of increase pain.  Increase c/o L hip/SI discomfort due to use of walking boot with standing/walking tasks.   Pt. presents with no swelling or bruising in L foot.  Pt. very hypersensitive with light palpation to L toes/foot (all bony landmarks).  Pt. guarded with all aspects of L toe/ankle PROM and unable to properly measure at this time.  LEFS: 17 out of 80.  Pt. will benefit from short-term PT to increase pain-free L ankle/toe AROM to promote improved standing tolerance/walking without assistive device.      Rehab Potential Good   PT Frequency 2x / week   PT Duration 3 weeks   PT Treatment/Interventions ADLs/Self Care Home Management;Aquatic Therapy;Cryotherapy;Iontophoresis 4mg /ml Dexamethasone;Ultrasound;Patient/family education;Neuromuscular re-education;Balance training;Therapeutic exercise;Therapeutic activities;Functional mobility training;Stair training;Gait training;Manual techniques;Passive range of motion;Dry needling   PT Next Visit Plan Increase L toe/ ankle AROM (all planes).  Progress out of walking boot into sneaker.     PT Home Exercise Plan See handouts.     Consulted and Agree with Plan of Care Patient      Patient will benefit from skilled therapeutic intervention in order to improve the following deficits and impairments:  Abnormal gait, Decreased strength, Increased muscle spasms, Postural dysfunction, Impaired perceived functional ability, Decreased activity tolerance, Decreased mobility, Impaired flexibility, Obesity, Decreased range of motion, Decreased balance, Pain, Decreased endurance  Visit Diagnosis: Pain in left foot  Pain in left ankle and joints of left foot  Difficulty in walking, not elsewhere classified  Muscle weakness (generalized)     Problem List There are no active problems to display for this  patient.  Cammie McgeeMichael C Kizzi Overbey, PT, DPT # [phone]   02/23/2016, 10:18 AM  [facility] [address] [place], [place], [address] Phone: [phone]   Fax:  [phone]  Name: Kelsey Bell MRN: [id number] Date of Birth: 10/19/1980

## 2016-02-24 ENCOUNTER — Encounter: Payer: Self-pay | Admitting: Physical Therapy

## 2016-02-24 ENCOUNTER — Ambulatory Visit: Payer: Worker's Compensation | Admitting: Physical Therapy

## 2016-02-24 DIAGNOSIS — R262 Difficulty in walking, not elsewhere classified: Secondary | ICD-10-CM

## 2016-02-24 DIAGNOSIS — M79672 Pain in left foot: Secondary | ICD-10-CM

## 2016-02-24 DIAGNOSIS — M25572 Pain in left ankle and joints of left foot: Secondary | ICD-10-CM

## 2016-02-24 DIAGNOSIS — M6281 Muscle weakness (generalized): Secondary | ICD-10-CM

## 2016-02-24 NOTE — Therapy (Signed)
[place] [facility] [address] [place], [place], [address] Phone: [phone]   Fax:  [phone]  Physical Therapy Treatment  Patient Details  Name: Kelsey Bell MRN: [id number] Date of Birth: 10/17/1980 Referring Provider: Dr. Fransisca Kaufmannroxlet  Encounter Date: 02/24/2016      PT End of Session - 02/24/16 1312    Visit Number 2   Number of Visits 6   Date for PT Re-Evaluation 03/16/16   PT Start Time 1007   PT Stop Time 1101   PT Time Calculation (min) 54 min   Activity Tolerance Patient limited by pain   Behavior During Therapy [facility] for tasks assessed/performed      Past Medical History  Diagnosis Date  . Breast pain present for several months    Bil LT >RT across of breasts    Past Surgical History  Procedure Laterality Date  . Tonsillectomy    . Knee arthroscopy      There were no vitals filed for this visit.      Subjective Assessment - 02/24/16 1257    Subjective Pt. states she was really hurting in L foot after initial evaluaiton.  Pt. presents to PT today in walking boot and moderate antalgic gait pattern.  Pt. states L foot pain travels up to L knee.  Pt. scheduled to have 2nd opinion with [place] in [place] next Wednesday.     Limitations Standing;Walking;House hold activities;Other (comment)   Patient Stated Goals Increase L ankle/toe AROM and pain-free mobility to promote return to work.     Currently in Pain? Yes   Pain Score 8    Pain Location Foot   Pain Orientation Left   Pain Descriptors / Indicators Constant;Burning;Radiating;Stabbing       OBJECTIVE:  There.ex.:  Reviewed HEP.  Manual tx.: supine L ankle subtalar grade I-II mobs. 3x20 sec. (discomfort with light hand placement).  L ankle DF/PF/IV/EV AA/PROM 5x each in pain tolerable range with static holds as tolerated.  STM to L foot/lower leg (gentle).  Gait training: amb. Short distances in clinic/ //-bars with cuing to increase step pattern/ stance phase of  gait on L and consistent heel strike and toe off. (very pain limited and hesitant to wt. Bear on L foot with and without UE assist.  Ice to L foot/lower leg in supine position after tx. Session.      Pt response for medical necessity: benefits from skilled PT to increase L ankle/foot/toe ROM and strengthening to improve pain-free mobility.  Persistent c/o L foot pain t/o tx. Session and pt. Able to wear sneaker and walk short distances.          PT Education - 02/23/16 0953    Education provided Yes   Education Details See HEP.  Encouraged pt. to stay active and move L toes/ankle.  PT recommended pt. to bring both sneakers next tx. session.    Person(s) Educated Patient   Methods Explanation;Demonstration;Handout   Comprehension Verbalized understanding;Returned demonstration;Need further instruction             PT Long Term Goals - 02/23/16 1008    PT LONG TERM GOAL #1   Title Pt. I with HEP to increase L ankle/toe AROM to WNL as compared to R ankle to improve pain-free mobility.     Baseline difficulty attempting to measure L ankle/toe due to c/o severe pain (pt. became very labile).     Time 4   Period Weeks   Status New  PT LONG TERM GOAL #2   Title Pt. will increase LEFS to >40 out of 80 to improve pain-free mobility/ return to work.     Baseline LEFS: 17 out of 80.     Time 4   Period Weeks   Status New   PT LONG TERM GOAL #3   Title Pt. able to ambulate 10 minutes with normalized gait pattern and use of sneakers with no limitations to promote improved mobility.     Baseline L CAM boot with moderate antalgic gait/ increase c/o pain.     Time 4   Period Weeks   Status New   PT LONG TERM GOAL #4   Title Pt. able to return to work with no L ankle pain/limitations.     Baseline currently out on work comp.     Time 4   Period Weeks   Status New             Plan - 02/24/16 1313    Clinical Impression Statement L foot/ankle presents with no signs of swelling or  bruising.  PT provided pt. with a lot of positive motivaiton due to fear of moving L foot/toes in supine position.  Increase ankle DF/ toe ext. AROM but pt. remains guarded/ pain focused with any attempts to streches/ passively move L ankle/toes.  Pt. able to don/doff sneaker on L and ambulate short distances in //-bars working on consistent heel stirke/ toe/ step pattern.  Pt. c/o high levels of pain t/o tx. session.     Rehab Potential Good   PT Frequency 2x / week   PT Duration 3 weeks   PT Treatment/Interventions ADLs/Self Care Home Management;Aquatic Therapy;Cryotherapy;Iontophoresis 4mg /ml Dexamethasone;Ultrasound;Patient/family education;Neuromuscular re-education;Balance training;Therapeutic exercise;Therapeutic activities;Functional mobility training;Stair training;Gait training;Manual techniques;Passive range of motion;Dry needling   PT Next Visit Plan Increase L toe/ ankle AROM (all planes).  Progress out of walking boot into sneaker.     PT Home Exercise Plan See handouts.        Patient will benefit from skilled therapeutic intervention in order to improve the following deficits and impairments:  Abnormal gait, Decreased strength, Increased muscle spasms, Postural dysfunction, Impaired perceived functional ability, Decreased activity tolerance, Decreased mobility, Impaired flexibility, Obesity, Decreased range of motion, Decreased balance, Pain, Decreased endurance  Visit Diagnosis: Pain in left foot  Pain in left ankle and joints of left foot  Difficulty in walking, not elsewhere classified  Muscle weakness (generalized)     Problem List There are no active problems to display for this patient.  Cammie McgeeMichael C Sherk, PT, DPT # [phone]   02/24/2016, 4:28 PM  [place] [facility] [address] [place], [place], [address] Phone: [phone]   Fax:  [phone]  Name: Kelsey Bell MRN: [id number] Date of Birth: 12/10/1980

## 2016-02-29 ENCOUNTER — Ambulatory Visit: Payer: Worker's Compensation | Admitting: Physical Therapy

## 2016-03-01 ENCOUNTER — Encounter: Payer: Self-pay | Admitting: Physical Therapy

## 2016-03-01 ENCOUNTER — Ambulatory Visit: Payer: Worker's Compensation | Admitting: Physical Therapy

## 2016-03-01 DIAGNOSIS — M6281 Muscle weakness (generalized): Secondary | ICD-10-CM

## 2016-03-01 DIAGNOSIS — M79672 Pain in left foot: Secondary | ICD-10-CM | POA: Diagnosis not present

## 2016-03-01 DIAGNOSIS — M25572 Pain in left ankle and joints of left foot: Secondary | ICD-10-CM

## 2016-03-01 DIAGNOSIS — R262 Difficulty in walking, not elsewhere classified: Secondary | ICD-10-CM

## 2016-03-01 NOTE — Therapy (Signed)
[place] [facility] [address] [place], [place], [address] Phone: [phone]   Fax:  [phone]  Physical Therapy Treatment  Patient Details  Name: Kelsey Bell MRN: [id number] Date of Birth: 04-13-1981 Referring Provider: Dr. Glynis Smiles  Encounter Date: 03/01/2016      PT End of Session - 03/01/16 1037    Visit Number 3   Number of Visits 6   Date for PT Re-Evaluation 03/16/16   PT Start Time 0945   PT Stop Time 1038   PT Time Calculation (min) 53 min   Activity Tolerance Patient limited by pain   Behavior During Therapy [facility] for tasks assessed/performed      Past Medical History  Diagnosis Date  . Breast pain present for several months    Bil LT >RT across of breasts    Past Surgical History  Procedure Laterality Date  . Tonsillectomy    . Knee arthroscopy      There were no vitals filed for this visit.      Subjective Assessment - 03/01/16 1033    Subjective Pt states that she is suffering from kidney infection and is currently taking percocet for pain which is helping her some with foot pain. She states she is not wearing the boot around the house but continues to wear it when she goes out in the public.   Limitations Standing;Walking;House hold activities;Other (comment)   Patient Stated Goals Increase L ankle/toe AROM and pain-free mobility to promote return to work.     Currently in Pain? Yes   Pain Score 6    Pain Location Foot   Pain Orientation Left   Pain Descriptors / Indicators Constant;Burning;Stabbing   Pain Type Acute pain   Pain Onset More than a month ago   Pain Frequency Constant      OBJECTIVE: There.ex.: Reviewed HEP; discussed addition of theraband for resisted AROM.AROM MTPs x15, AROM L ankle DF/PF/IV/EV as tolerated x15. Pt c/o pain with all planes of movement with limited range most notably in EV. Manual tx.: L ankle DF/PF/IV/EV AA/PROM 15x each in pain tolerable range with static holds as  tolerated.Pt with inconsistent complaints of pain over 4/5 MET head, medial arch and dorsum of foot with static holds. STM to L anterior tib; pt with pain sx from foot to just distal to knee along anterior tib muscle belly. Ice to L foot/lower leg in supine position after tx. Session.    Pt response for medical necessity: benefits from skilled PT to increase L ankle/foot/toe ROM and strengthening to improve pain-free mobility. Persistent c/o L foot pain t/o tx. Session.       PT Long Term Goals - 02/23/16 1008    PT LONG TERM GOAL #1   Title Pt. I with HEP to increase L ankle/toe AROM to WNL as compared to R ankle to improve pain-free mobility.     Baseline difficulty attempting to measure L ankle/toe due to c/o severe pain (pt. became very labile).     Time 4   Period Weeks   Status New   PT LONG TERM GOAL #2   Title Pt. will increase LEFS to >40 out of 80 to improve pain-free mobility/ return to work.     Baseline LEFS: 17 out of 80.     Time 4   Period Weeks   Status New   PT LONG TERM GOAL #3   Title Pt. able to ambulate 10 minutes with normalized gait pattern and  use of sneakers with no limitations to promote improved mobility.     Baseline L CAM boot with moderate antalgic gait/ increase c/o pain.     Time 4   Period Weeks   Status New   PT LONG TERM GOAL #4   Title Pt. able to return to work with no L ankle pain/limitations.     Baseline currently out on work comp.     Time 4   Period Weeks   Status New           Plan - 03/01/16 1105    Clinical Impression Statement Pt continues to be extremely guarded in toe/foot PROM/AAROM/AROM. She is hypersensitive to [facility] of L anterior tibialis and generalized sensitivity over dorsum/plantar surfaces of L foot. Pt with spontaneous movement of MTPs 1-5 during PROM/AAROM of LLE.   Rehab Potential Good   PT Frequency 2x / week   PT Duration 3 weeks   PT Treatment/Interventions ADLs/Self Care Home Management;Aquatic  Therapy;Cryotherapy;Iontophoresis 64m/ml Dexamethasone;Ultrasound;Patient/family education;Neuromuscular re-education;Balance training;Therapeutic exercise;Therapeutic activities;Functional mobility training;Stair training;Gait training;Manual techniques;Passive range of motion;Dry needling   PT Next Visit Plan Increase L toe/ ankle AROM (all planes).  Progress out of walking boot into sneaker.     PT Home Exercise Plan See handouts.     Consulted and Agree with Plan of Care Patient      Patient will benefit from skilled therapeutic intervention in order to improve the following deficits and impairments:  Abnormal gait, Decreased strength, Increased muscle spasms, Postural dysfunction, Impaired perceived functional ability, Decreased activity tolerance, Decreased mobility, Impaired flexibility, Obesity, Decreased range of motion, Decreased balance, Pain, Decreased endurance  Visit Diagnosis: Pain in left foot  Pain in left ankle and joints of left foot  Difficulty in walking, not elsewhere classified  Muscle weakness (generalized)     Problem List There are no active problems to display for this patient.  MPura Spice PT, DPT # [id number] Memo SPT  03/01/2016, 12:38 PM  [place] [facility] [address]Phone: [phone]  Fax:  [phone] Name: Kelsey KABLEMRN: [id number] of Birth: 106/01/1981

## 2016-03-02 ENCOUNTER — Ambulatory Visit: Payer: Worker's Compensation | Admitting: Physical Therapy

## 2016-03-02 ENCOUNTER — Encounter: Payer: Self-pay | Admitting: Physical Therapy

## 2016-03-02 DIAGNOSIS — M25572 Pain in left ankle and joints of left foot: Secondary | ICD-10-CM

## 2016-03-02 DIAGNOSIS — M79672 Pain in left foot: Secondary | ICD-10-CM | POA: Diagnosis not present

## 2016-03-02 DIAGNOSIS — R262 Difficulty in walking, not elsewhere classified: Secondary | ICD-10-CM

## 2016-03-02 DIAGNOSIS — M6281 Muscle weakness (generalized): Secondary | ICD-10-CM

## 2016-03-02 NOTE — Therapy (Signed)
[place] [facility] [address] [place], [place], [address] Phone: [phone]   Fax:  [phone]  Physical Therapy Treatment  Patient Details  Name: Kelsey Bell MRN: [id number] Date of Birth: 12/08/1980 Referring Provider: Dr. Fransisca Kaufmannroxlet  Encounter Date: 03/02/2016      PT End of Session - 03/02/16 1638    Visit Number 4   Number of Visits 6   Date for PT Re-Evaluation 03/16/16   PT Start Time 1427   PT Stop Time 1530   PT Time Calculation (min) 63 min   Activity Tolerance Patient limited by pain   Behavior During Therapy [facility] for tasks assessed/performed      Past Medical History  Diagnosis Date  . Breast pain present for several months    Bil LT >RT across of breasts    Past Surgical History  Procedure Laterality Date  . Tonsillectomy    . Knee arthroscopy      There were no vitals filed for this visit.      Subjective Assessment - 03/02/16 1634    Subjective Pt reports that she saw new MD this morning. States he instructed her to wear cam boot and predicted 3-4 month healing time due to bone contusion. Pt states that she was excited over last PT visit with progress in MTP mobility.    Limitations Standing;Walking;House hold activities;Other (comment)   Patient Stated Goals Increase L ankle/toe AROM and pain-free mobility to promote return to work.     Currently in Pain? Yes   Pain Score 8    Pain Location Foot   Pain Orientation Left   Pain Descriptors / Indicators Constant;Burning;Stabbing      OBJECTIVE: There.ex.: Reviewed/progressed HEP.Performed resisted ankle ROM with theraband 1x15 all planes - pain limited. Pt with spontaneous MTP flexion most notable during dorsiflexion/eversion. Pt in // bars with LLE on blue air ex pad; performed medial/lateral and anterior/posterior weight shift with emphasis on increasing WB tolerance of LLE. Pt demonstrates decreased sensitivity to WB with continued practice. Gait  training: amb. Short distances in clinic/ //-bars with cuing to increase step pattern/ stance phase of gait on L and consistent heel strike and toe off. Pt requires use of UE assist on // bars to begin but progresses to no UE support with good affect and increasingly equal WB tolerance. Ice to L foot/lower leg in supine position after tx. Session.   Pt response for medical necessity: benefits from skilled PT to increase L ankle/foot/toe ROM and strengthening to improve pain-free mobility. Persistent c/o L foot pain throughout session but with decreasing frequency with cont. Exercise. Pt demonstrates increase in activity tolerance this session with progress in ankle ROM in dorsiflexion/plantarflexion.        PT Long Term Goals - 02/23/16 1008    PT LONG TERM GOAL #1   Title Pt. I with HEP to increase L ankle/toe AROM to WNL as compared to R ankle to improve pain-free mobility.     Baseline difficulty attempting to measure L ankle/toe due to c/o severe pain (pt. became very labile).     Time 4   Period Weeks   Status New   PT LONG TERM GOAL #2   Title Pt. will increase LEFS to >40 out of 80 to improve pain-free mobility/ return to work.     Baseline LEFS: 17 out of 80.     Time 4   Period Weeks   Status New   PT LONG TERM  GOAL #3   Title Pt. able to ambulate 10 minutes with normalized gait pattern and use of sneakers with no limitations to promote improved mobility.     Baseline L CAM boot with moderate antalgic gait/ increase c/o pain.     Time 4   Period Weeks   Status New   PT LONG TERM GOAL #4   Title Pt. able to return to work with no L ankle pain/limitations.     Baseline currently out on work comp.     Time 4   Period Weeks   Status New            Plan - 03/02/16 1639    Clinical Impression Statement Pt demonstrates improvement in ankle AROM following WB activity and gait training. She continues to c/o high intensity of pain with WB but is able to tolerate  bouts on air ex pad. Demonstrates increased ROM in ankle dorsiflexoin/plantarflexion with appear approx WFL while inversion/eversion still limited by 50%.    Rehab Potential Good   PT Frequency 2x / week   PT Duration 3 weeks   PT Treatment/Interventions ADLs/Self Care Home Management;Aquatic Therapy;Cryotherapy;Iontophoresis 4mg /ml Dexamethasone;Ultrasound;Patient/family education;Neuromuscular re-education;Balance training;Therapeutic exercise;Therapeutic activities;Functional mobility training;Stair training;Gait training;Manual techniques;Passive range of motion;Dry needling   PT Next Visit Plan Increase L toe/ ankle AROM (all planes).  Progress out of walking boot into sneaker.     PT Home Exercise Plan See handouts.     Consulted and Agree with Plan of Care Patient      Patient will benefit from skilled therapeutic intervention in order to improve the following deficits and impairments:  Abnormal gait, Decreased strength, Increased muscle spasms, Postural dysfunction, Impaired perceived functional ability, Decreased activity tolerance, Decreased mobility, Impaired flexibility, Obesity, Decreased range of motion, Decreased balance, Pain, Decreased endurance  Visit Diagnosis: Pain in left foot  Pain in left ankle and joints of left foot  Difficulty in walking, not elsewhere classified  Muscle weakness (generalized)     Problem List There are no active problems to display for this patient.  Cammie McgeeMichael C Sherk, PT, DPT # [phone]  Vernona RiegerLaura Gem Conkle SPT 03/02/2016, 4:42 PM  [place] [facility] [address] [place], [place], [address] Phone: [phone]   Fax:  [phone]  Name: Kelsey Bell MRN: [id number] Date of Birth: 03/03/1981

## 2016-03-06 ENCOUNTER — Ambulatory Visit: Payer: Worker's Compensation | Attending: Podiatry | Admitting: Physical Therapy

## 2016-03-06 ENCOUNTER — Encounter: Payer: Worker's Compensation | Admitting: Physical Therapy

## 2016-03-06 ENCOUNTER — Encounter: Payer: Self-pay | Admitting: Physical Therapy

## 2016-03-06 DIAGNOSIS — M79672 Pain in left foot: Secondary | ICD-10-CM

## 2016-03-06 DIAGNOSIS — R262 Difficulty in walking, not elsewhere classified: Secondary | ICD-10-CM | POA: Insufficient documentation

## 2016-03-06 DIAGNOSIS — M6281 Muscle weakness (generalized): Secondary | ICD-10-CM | POA: Diagnosis present

## 2016-03-06 DIAGNOSIS — M25572 Pain in left ankle and joints of left foot: Secondary | ICD-10-CM | POA: Insufficient documentation

## 2016-03-06 NOTE — Therapy (Signed)
[place] [facility] [address] [place], [place], [address] Phone: [phone]   Fax:  [phone]  Physical Therapy Treatment  Patient Details  Name: Kelsey MonsLauren B Bell MRN: [id number] Date of Birth: 03/20/1981 Referring Provider: Dr. Fransisca Kaufmannroxlet  Encounter Date: 03/06/2016      PT End of Session - 03/06/16 1641    Visit Number 5   Number of Visits 6   Date for PT Re-Evaluation 03/16/16   PT Start Time 1345   PT Stop Time 1441   PT Time Calculation (min) 56 min   Activity Tolerance Patient limited by pain   Behavior During Therapy [facility] for tasks assessed/performed      Past Medical History  Diagnosis Date  . Breast pain present for several months    Bil LT >RT across of breasts    Past Surgical History  Procedure Laterality Date  . Tonsillectomy    . Knee arthroscopy      There were no vitals filed for this visit.      Subjective Assessment - 03/06/16 1349    Subjective Pt reports an increase in R foot pain following Thursday PT session. She states that the pain cont. all weekend. Continuing to wear boot for outside amb.   Limitations Standing;Walking;House hold activities;Other (comment)   Patient Stated Goals Increase L ankle/toe AROM and pain-free mobility to promote return to work.     Currently in Pain? Yes   Pain Score 9    Pain Location Foot   Pain Orientation Left   Pain Descriptors / Indicators Constant;Burning;Stabbing   Pain Type Acute pain   Pain Onset More than a month ago      Objective: Manual tx: AP dorsiflexion mobilization grade II-III to pt tolerance; pt reports immediate relief following mob but with grade III grimaces consistently with pressure. There ex: AROM DF/PF/EV/IV, pt unable to tolerate manual resistance during today's session, with episodes of emotional lability with increase in exercise intensity. Pt demonstrates increased dorsiflexion ROM and tolerance with functional task consistently; with  decreased ROM associated with strict AROM focus. Focus on intrinsic foot musculature with bead activity. Pt able to manipulate 1in blocks/balls incorporating all planes of ankle AROM and MTP AROM with no c/o increased pain. Pt seated on mat table - weight bearing through LLE with functional AROM focus to progress toward standing tolerance.  Ice following tx for pain relief 10 minutes (no charge).   Pt response for medical necessity: Pt demonstrates deficits in ankle/MTP ROM limited by severe pain. She experiences increase in pain with weight bearing activity and is generally guarded, preferring to wear CAM boot for all activities secondary to fear of increase in pain. She will benefit from skilled PT to progress ROM to more functional range and progress toward safe, functional gait mechanics and ultimate return to work.        PT Long Term Goals - 02/23/16 1008    PT LONG TERM GOAL #1   Title Pt. I with HEP to increase L ankle/toe AROM to WNL as compared to R ankle to improve pain-free mobility.     Baseline difficulty attempting to measure L ankle/toe due to c/o severe pain (pt. became very labile).     Time 4   Period Weeks   Status New   PT LONG TERM GOAL #2   Title Pt. will increase LEFS to >40 out of 80 to improve pain-free mobility/ return to work.     Baseline LEFS: 17  out of 80.     Time 4   Period Weeks   Status New   PT LONG TERM GOAL #3   Title Pt. able to ambulate 10 minutes with normalized gait pattern and use of sneakers with no limitations to promote improved mobility.     Baseline L CAM boot with moderate antalgic gait/ increase c/o pain.     Time 4   Period Weeks   Status New   PT LONG TERM GOAL #4   Title Pt. able to return to work with no L ankle pain/limitations.     Baseline currently out on work comp.     Time 4   Period Weeks   Status New               Plan - 03/06/16 1642    Clinical Impression Statement Pt demonstrates increased anxiety over WB and  ambulation without CAM boot today due to increase in foot/MTP pain following last session. She is able to tolerate AROM with measurements today: R/L DF 4/(-22), PF 72/67, EV 22/5, IV 45/43/ Pt demonstrates increased tolerance of MTP AROM in flexion/extension with no c/o during instrinsic foot task.   Rehab Potential Good   PT Frequency 2x / week   PT Duration 3 weeks   PT Treatment/Interventions ADLs/Self Care Home Management;Aquatic Therapy;Cryotherapy;Iontophoresis 4mg /ml Dexamethasone;Ultrasound;Patient/family education;Neuromuscular re-education;Balance training;Therapeutic exercise;Therapeutic activities;Functional mobility training;Stair training;Gait training;Manual techniques;Passive range of motion;Dry needling   PT Next Visit Plan Increase L toe/ ankle AROM (all planes).  Progress out of walking boot into sneaker.     PT Home Exercise Plan See handouts.     Consulted and Agree with Plan of Care Patient      Patient will benefit from skilled therapeutic intervention in order to improve the following deficits and impairments:  Abnormal gait, Decreased strength, Increased muscle spasms, Postural dysfunction, Impaired perceived functional ability, Decreased activity tolerance, Decreased mobility, Impaired flexibility, Obesity, Decreased range of motion, Decreased balance, Pain, Decreased endurance  Visit Diagnosis: Pain in left foot  Pain in left ankle and joints of left foot  Difficulty in walking, not elsewhere classified  Muscle weakness (generalized)     Problem List There are no active problems to display for this patient.   Vernona RiegerLaura Aronda Burford SPT 03/06/2016, 4:47 PM  [place] [facility] [address] [place], [place], [address] Phone: [phone]   Fax:  [phone]  Name: Kelsey MonsLauren B Bell MRN: [id number] Date of Birth: 03/28/1981

## 2016-03-09 ENCOUNTER — Ambulatory Visit: Payer: Worker's Compensation

## 2016-03-09 ENCOUNTER — Encounter: Payer: Self-pay | Admitting: Physical Therapy

## 2016-03-09 DIAGNOSIS — R262 Difficulty in walking, not elsewhere classified: Secondary | ICD-10-CM

## 2016-03-09 DIAGNOSIS — M6281 Muscle weakness (generalized): Secondary | ICD-10-CM

## 2016-03-09 DIAGNOSIS — M79672 Pain in left foot: Secondary | ICD-10-CM

## 2016-03-09 DIAGNOSIS — M25572 Pain in left ankle and joints of left foot: Secondary | ICD-10-CM

## 2016-03-09 NOTE — Therapy (Signed)
[facility] [address] [place], [place], [address] Phone: [phone]   Fax:  [phone]  Physical Therapy Treatment/Discharge Summary  Patient Details  Name: Kelsey Bell MRN: [id number] Date of Birth: 1981/07/11 Referring Provider: Dr. Glynis Smiles  Encounter Date: 03/09/2016      PT End of Session - 03/09/16 1443    Visit Number 6   Number of Visits 6   Date for PT Re-Evaluation 03/16/16   PT Start Time 1300   PT Stop Time 1400   PT Time Calculation (min) 60 min   Activity Tolerance Patient limited by pain   Behavior During Therapy [facility] for tasks assessed/performed      Past Medical History  Diagnosis Date  . Breast pain present for several months    Bil LT >RT across of breasts    Past Surgical History  Procedure Laterality Date  . Tonsillectomy    . Knee arthroscopy      There were no vitals filed for this visit.      Subjective Assessment - 03/09/16 1307    Subjective Pt reports that she has been able to abd toes on L foot over the last two days. States that she has been walking inside the house not wearing CAM boot while walking with 8lb vaccumm. States that she will f/u with preferred MD on 7/17 to discuss progress.   Limitations Standing;Walking;House hold activities;Other (comment)   Patient Stated Goals Increase L ankle/toe AROM and pain-free mobility to promote return to work.     Currently in Pain? No/denies      Objective: There ex: Ankle AROM all planes x10 DF/PF/EV/IV. Pt states she has increased pain with EV and DF but tolerable. Resisted isometrics: DF/PF/EV/IV with mild pain reported in all directions at point of manual pressure . 4 - 6'' steps x4; pt with initial pain across dorsum of L foot; able to tolerate repeated trials. HEP review: with emphasis on indep mgmt; focus on AROM, resisted AROM and static balance. Gait: Pt ambulated for 10 minutes with mild c/o of burning in L foot and noted endurance  deficit. Demonstrates improved gait pattern with practice and minimal cueing for heel strike/toe off and knee flexion of affected LLE (pt amb as if still wearing CAM boot). Neuromuscular Re-ed: SLS 1 minute on RLE in // bars, SLS approx 20-30 sec on LLE in // bars with UE support as needed.  Pt response for medical necessity: Pt demonstrates vastly improved functional mobility at this time. She has benefited from skilled PT to progress pain tolerable AROM and progress toward amb outside of CAM boot and has demonstrated success with indep management of HEP program.         PT Education - 03/09/16 1442    Education provided Yes   Education Details See HEP. Encouraged start of walking program using gym shoes.   Person(s) Educated Patient   Methods Explanation;Handout   Comprehension Verbalized understanding             PT Long Term Goals - 03/09/16 1457    PT LONG TERM GOAL #1   Title Pt. I with HEP to increase L ankle/toe AROM to WNL as compared to R ankle to improve pain-free mobility.     Baseline difficulty attempting to measure L ankle/toe due to c/o severe pain (pt. became very labile).  7/6 DF (-4), PF (61), EV (23), IV (45)   Time 4   Period Weeks   Status  Partially Met   PT LONG TERM GOAL #2   Title Pt. will increase LEFS to >40 out of 80 to improve pain-free mobility/ return to work.     Baseline LEFS: 17 out of 80. 7/6 LEFS 31/80   Time 4   Period Weeks   Status Partially Met   PT LONG TERM GOAL #3   Title Pt. able to ambulate 10 minutes with normalized gait pattern and use of sneakers with no limitations to promote improved mobility.     Baseline L CAM boot with moderate antalgic gait/ increase c/o pain.     Time 4   Period Weeks   Status Achieved   PT LONG TERM GOAL #4   Title Pt. able to return to work with no L ankle pain/limitations.     Baseline currently out on work comp.     Time 4   Period Weeks   Status Partially Met               Plan -  03/09/16 1445    Clinical Impression Statement Pt demonstrates vast improvement from last session. She is able to ambulate wearing sneakers (no CAM boot) for 10 minutes with reports of "burning" in sole of foot towards end of ambulation trial but demonstrates improved gait pattern (with decrease antalgic gait with increase knee flexion/hip flexion - pt initial tendency with decreased stance on LLE and decreased knee flexion). AROM: DF (-4) PF (61) IV (45) EV 23 with pain at end range DF/EV. Single leg stance: 1 minute on RLE, 30 sec on LLE. Pts compliance with HEP AROM program and indoor walking without CAM boot has vastly improved pain and ROM deficit; pt will benefit from independent exercise program at this time to progress to back to work. She will f/u with MD on 7/17 to discuss further need for treatment.  Pt will be discharged at this time with instructions to continue HEP and progress walking out of boot. Pt instructed to call or return if she fails to continue making progress or physician has additional concerns.    Rehab Potential Good   PT Frequency 2x / week   PT Duration 3 weeks   PT Treatment/Interventions ADLs/Self Care Home Management;Aquatic Therapy;Cryotherapy;Iontophoresis 62m/ml Dexamethasone;Ultrasound;Patient/family education;Neuromuscular re-education;Balance training;Therapeutic exercise;Therapeutic activities;Functional mobility training;Stair training;Gait training;Manual techniques;Passive range of motion;Dry needling   PT Next Visit Plan Discharge   PT Home Exercise Plan See handouts.     Consulted and Agree with Plan of Care Patient      Patient will benefit from skilled therapeutic intervention in order to improve the following deficits and impairments:  Abnormal gait, Decreased strength, Increased muscle spasms, Postural dysfunction, Impaired perceived functional ability, Decreased activity tolerance, Decreased mobility, Impaired flexibility, Obesity, Decreased range of  motion, Decreased balance, Pain, Decreased endurance  Visit Diagnosis: Pain in left foot  Pain in left ankle and joints of left foot  Difficulty in walking, not elsewhere classified  Muscle weakness (generalized)     Problem List There are no active problems to display for this patient.  This entire session was performed under direct supervision and direction of a licensed therapist/therapist assistant . I have personally read, edited and approve of the note as written.   LMickel BaasBissing SPT Huprich,Jason DPT 03/09/2016, 4:49 PM  [facility] [place] [address]Phone: [phone]  Fax:  [phone] Name: LTEQUILA ROTTMANNMRN: [id number] of Birth: 104/11/1980

## 2016-10-25 ENCOUNTER — Telehealth: Payer: Self-pay | Admitting: Pharmacist

## 2016-10-25 NOTE — Telephone Encounter (Signed)
Symbicort PAP submitted to manufacturer today.

## 2016-11-03 ENCOUNTER — Telehealth: Payer: Self-pay | Admitting: Pharmacist

## 2016-11-03 NOTE — Telephone Encounter (Signed)
Faxed [employer] refill request for Prozac 40mg  Take 2 capsules (80mg ) by mouth every day at bedtime.

## 2016-11-30 ENCOUNTER — Telehealth: Payer: Self-pay | Admitting: Pharmacist

## 2016-11-30 NOTE — Telephone Encounter (Signed)
Prozac 40 mg Take 2 capsules, (80mg ) by mouth every day at bedtime-11/30/2016 Prozac - Faxed pages 8 & 9 of application after Dr. Cherylann RatelLateef resigned and expiration dates were updated. Per representative Arlys JohnBrian @ [employer] they needed this before they could complete processing.

## 2016-12-15 DIAGNOSIS — G43909 Migraine, unspecified, not intractable, without status migrainosus: Secondary | ICD-10-CM | POA: Insufficient documentation

## 2016-12-25 ENCOUNTER — Telehealth: Payer: Self-pay | Admitting: Pharmacist

## 2016-12-25 NOTE — Telephone Encounter (Signed)
12/25/16 Called [facility] for refill on Symbicort 160/4.5mcg.  ° °

## 2017-02-20 ENCOUNTER — Telehealth: Payer: Self-pay | Admitting: Pharmacist

## 2017-02-20 NOTE — Telephone Encounter (Signed)
02/20/17 Faxed Re Enrollment application to [employer] for Prozac 40mg  Take 2 capsules by mouth daily at bedtime, # [phone]

## 2017-02-27 ENCOUNTER — Emergency Department: Payer: Self-pay

## 2017-02-27 ENCOUNTER — Emergency Department
Admission: EM | Admit: 2017-02-27 | Discharge: 2017-02-27 | Disposition: A | Discharge status: Home / Self Care | Payer: Self-pay | Attending: Emergency Medicine | Admitting: Emergency Medicine

## 2017-02-27 ENCOUNTER — Encounter: Payer: Self-pay | Admitting: Emergency Medicine

## 2017-02-27 DIAGNOSIS — Y999 Unspecified external cause status: Secondary | ICD-10-CM | POA: Insufficient documentation

## 2017-02-27 DIAGNOSIS — W19XXXA Unspecified fall, initial encounter: Secondary | ICD-10-CM

## 2017-02-27 DIAGNOSIS — Z79899 Other long term (current) drug therapy: Secondary | ICD-10-CM | POA: Insufficient documentation

## 2017-02-27 DIAGNOSIS — W010XXA Fall on same level from slipping, tripping and stumbling without subsequent striking against object, initial encounter: Secondary | ICD-10-CM | POA: Insufficient documentation

## 2017-02-27 DIAGNOSIS — S60221A Contusion of right hand, initial encounter: Secondary | ICD-10-CM

## 2017-02-27 DIAGNOSIS — S20219A Contusion of unspecified front wall of thorax, initial encounter: Secondary | ICD-10-CM

## 2017-02-27 DIAGNOSIS — Y93K1 Activity, walking an animal: Secondary | ICD-10-CM | POA: Insufficient documentation

## 2017-02-27 DIAGNOSIS — S0081XA Abrasion of other part of head, initial encounter: Secondary | ICD-10-CM

## 2017-02-27 DIAGNOSIS — S8002XA Contusion of left knee, initial encounter: Secondary | ICD-10-CM

## 2017-02-27 DIAGNOSIS — S0083XA Contusion of other part of head, initial encounter: Secondary | ICD-10-CM

## 2017-02-27 DIAGNOSIS — S8001XA Contusion of right knee, initial encounter: Secondary | ICD-10-CM

## 2017-02-27 DIAGNOSIS — Y929 Unspecified place or not applicable: Secondary | ICD-10-CM | POA: Insufficient documentation

## 2017-02-27 DIAGNOSIS — S60512A Abrasion of left hand, initial encounter: Secondary | ICD-10-CM

## 2017-02-27 DIAGNOSIS — S60511A Abrasion of right hand, initial encounter: Secondary | ICD-10-CM

## 2017-02-27 MED ORDER — METHOCARBAMOL 500 MG PO TABS: 500.0000 mg | ORAL_TABLET | Freq: Four times a day (QID) | ORAL | 0 refills | Status: DC

## 2017-02-27 MED ORDER — MELOXICAM 15 MG PO TABS: 15.0000 mg | ORAL_TABLET | Freq: Every day | ORAL | 0 refills | Status: DC

## 2017-02-27 NOTE — ED Notes (Signed)
Pt returned from scans via wheelchair  

## 2017-02-27 NOTE — ED Provider Notes (Signed)
[facility] Emergency Department Provider Note  ____________________________________________  Time seen: Approximately 7:19 PM  I have reviewed the triage vital signs and the nursing notes.   HISTORY  Chief Complaint Fall    HPI Kelsey Bell is a 36 y.o. female who presents to emergency department complaining of headache, right-sided facial pain, blurry vision to the right eye, anterior chest wall pain, right hand pain, bilateral knee pain. Patient reports that she was assisting a neighbor with her 115 pound dog. She reports that the dog lunged forward while she had the leash and the hand causing her to fall forward striking her head and face on the concrete. Patient does not remember losing consciousness but states "I'm not totally sure." Patient endorses global headache with right eye blurriness. She states that it feels like "I just can't focus out of the right eye." Patient also reports sharp right-sided facial pain hurting from soap oral region into her maxillary region. Patient denies any difficulty breathing but does endorse chest wall pain. She denies any shortness of breath. She denies any abdominal pain or nausea or vomiting. Patient states that she tried to stop her fall with her hands. She is endorsing bilateral hand pain, worse on the right than left. She endorses abrasions to both hands. She is up-to-date on her tetanus shot. Patient also endorses bilateral knee pain from fall. She's been ambulatory. Patient has a walking boot to the left foot from a previous crush injury to the left foot approximately a year ago. No pain to the left foot at this time. Patient denies any radicular symptoms at this time. No neck or lower back pain.   Past Medical History:  Diagnosis Date  . Breast pain present for several months   Bil LT >RT across of breasts    There are no active problems to display for this patient.   Past Surgical History:  Procedure Laterality  Date  . KNEE ARTHROSCOPY    . TONSILLECTOMY      Prior to Admission medications   Medication Sig Start Date End Date Taking? Authorizing Provider  dicyclomine (BENTYL) 20 MG tablet Take 1 tablet (20 mg total) by mouth 3 (three) times daily as needed for spasms. Patient not taking: Reported on 02/23/2016 08/25/15   Sharman Cheek, MD  FLUoxetine (PROZAC) 40 MG capsule Take 80 mg by mouth daily.    [provider]  meloxicam (MOBIC) 15 MG tablet Take 1 tablet (15 mg total) by mouth daily. 02/27/17   Anis Cinelli, Delorise Royals, PA-C  methocarbamol (ROBAXIN) 500 MG tablet Take 1 tablet (500 mg total) by mouth 4 (four) times daily. 02/27/17   Simmie Garin, Delorise Royals, PA-C  montelukast (SINGULAIR) 10 MG tablet Take 10 mg by mouth at bedtime.    [provider]  ondansetron (ZOFRAN ODT) 8 MG disintegrating tablet Take 1 tablet (8 mg total) by mouth every 8 (eight) hours as needed for nausea or vomiting. Patient not taking: Reported on 02/23/2016 08/25/15   Sharman Cheek, MD  ranitidine (ZANTAC) 150 MG capsule Take 1 capsule (150 mg total) by mouth 2 (two) times daily. Patient not taking: Reported on 02/23/2016 08/25/15   Sharman Cheek, MD  senna (SENOKOT) 8.6 MG tablet Take 1 tablet by mouth daily.    [provider]  topiramate (TOPAMAX) 100 MG tablet Take 100 mg by mouth 2 (two) times daily.    [provider]  traMADol (ULTRAM) 50 MG tablet Take by mouth every 6 (six) hours as  needed.    [provider]    Allergies Esomeprazole; Hydroxychloroquine; Cefdinir; Prednisone; Amoxicillin; and Sulfa antibiotics  History reviewed. No pertinent family history.  Social History Social History  Substance Use Topics  . Smoking status: Never Smoker  . Smokeless tobacco: Never Used  . Alcohol use No     Review of Systems  Constitutional: No fever/chills Eyes: Positive for blurred/out of focus vision to the right eye. No discharge ENT: No upper  respiratory complaints. Cardiovascular: no chest pain. Respiratory: no cough. No SOB. Gastrointestinal: No abdominal pain.  No nausea, no vomiting.  No diarrhea.  No constipation. Musculoskeletal: Positive for right-sided facial pain. Positive for chest wall pain. Positive for bilateral hand pain. Positive for bilateral knee pain. Skin: Positive for abrasion to the right cheek, bilateral hands, bilateral knees Neurological: Positive for global headache but denies, focal weakness or numbness. 10-point ROS otherwise negative.  ____________________________________________   PHYSICAL EXAM:  VITAL SIGNS: ED Triage Vitals  Enc Vitals Group     BP 02/27/17 1849 (!) 114/56     Pulse Rate 02/27/17 1849 83     Resp 02/27/17 1849 18     Temp 02/27/17 1849 97.7 F (36.5 C)     Temp Source 02/27/17 1849 Oral     SpO2 02/27/17 1849 96 %     Weight 02/27/17 1849 260 lb (117.9 kg)     Height 02/27/17 1849 5\' 8"  (1.727 m)     Head Circumference --      Peak Flow --      Pain Score 02/27/17 1848 9     Pain Loc --      Pain Edu? --      Excl. in GC? --      Constitutional: Alert and oriented. Well appearing and in no acute distress. Eyes: Conjunctivae are normal. PERRL. EOMI. Head: Abrasion and edema noted to the right cheek. No gross deformities of the skull or face. Patient is nontender to palpation over the osseous structures of the skull. Patient is very tender to palpation along the zygomatic arch and inferior orbital region on the right side. No TMJ tenderness to palpation. No mandibular tenderness to palpation. No raccoon eyes. No battle signs. No serosanguineous fluid drainage in the ears or nares. On visualization, it appears the patient has right-sided facial droop. As she blinks, right eye does not fully close. ENT:      Ears:       Nose: No congestion/rhinnorhea.      Mouth/Throat: Mucous membranes are moist. No intraoral lacerations noted. No loose dentition. Neck: No stridor.  No  cervical spine tenderness to palpation.  Cardiovascular: Normal rate, regular rhythm. Normal S1 and S2.  Good peripheral circulation. Respiratory: Normal respiratory effort without tachypnea or retractions. Lungs CTAB. Good air entry to the bases with no decreased or absent breath sounds. Gastrointestinal: Bowel sounds 4 quadrants. Soft and nontender to palpation. No guarding or rigidity. No palpable masses. No distention.  Musculoskeletal: Full range of motion to all extremities. No gross deformities appreciated. No visible deformities to anterior chest wall upon inspection. Minor abrasion is noted to sternal region. No bleeding. Equal chest rise and fall. Patient is tender to palpation distal half of the sternum. No palpable abnormality. No crepitus. No paradoxical chest wall movement. Good underlying breath sounds bilaterally. No visible deformity of right hand. Minor abrasion noted to the thenar eminence. No bleeding at this time. No foreign body. Full range of motion all digits right hand. Full  range of motion to the wrist. Patient is very tender to palpation over the third, fourth, fifth metacarpal bones. No palpable abnormality. Range of motion all digits. Sensation intact all 5 digits. Cap refill intact all 5 digits. Radial pulse intact. Examination of the left hand reveals no acute deformity or edema. Full range of motion to the wrist and all digits of the left hand. Patient is nontender to palpation over the osseous structures of the wrist and hand. No palpable abnormality. Cap refill intact all 5 digits. Sensation intact all 5 digits. No gross deformity or gross edema noted to the right knee upon inspection. Minor abrasion is noted. No bleeding. No foreign body. Full range of motion to the knee. Patient is Mollie tender to palpation over the tibial tuberosity. No other tenderness to palpation. There is, valgus, Lachman's, McMurray's is negative. Dorsalis pedis pulse intact distally. Sensation intact  distally. Visualization of the left knee reveals no acute deformity or gross edema. Full range of motion to the knee. Patient has superficial abrasion noted to the left knee. No bleeding. No foreign body. Patient is nontender to palpation over the osseous structures of the knee. No palpable abnormality. Varus, valgus, Lachman's, McMurray's is negative. Dorsalis pedis pulse intact distally. Sensation intact distally. Neurologic:  Normal speech and language. No gross focal neurologic deficits are appreciated. Cranial nerves II through XII grossly intact. During discussion with patient, patient has facial droop, and complete closure of the right eye. On cranial nerve testing, patient has equal facial symmetry with smile, frown, closing of eyes. Skin:  Skin is warm, dry and intact. No rash noted. Abrasions as noted above. Psychiatric: Mood and affect are normal. Speech and behavior are normal. Patient exhibits appropriate insight and judgement.   ____________________________________________   LABS (all labs ordered are listed, but only abnormal results are displayed)  Labs Reviewed - No data to display ____________________________________________  EKG   ____________________________________________  RADIOLOGY Festus Barren Judithann Villamar, personally viewed and evaluated these images (plain radiographs) as part of my medical decision making, as well as reviewing the written report by the radiologist.  Dg Chest 2 View  Result Date: 02/27/2017 CLINICAL DATA:  36 year old who fell at home while doing something with her dog. Sternal chest pain, right hand pain and bilateral knee pain. Initial encounter. EXAM: CHEST  2 VIEW COMPARISON:  01/15/2014, 09/12/2012, 01/03/2007. FINDINGS: Cardiomediastinal silhouette unremarkable, unchanged. Lungs clear. Bronchovascular markings normal. Pulmonary vascularity normal. No visible pleural effusions. No pneumothorax. Visualized bony thorax intact. No interval change.  IMPRESSION: No acute cardiopulmonary disease.  Stable examination. Electronically Signed   By: Hulan Saas M.D.   On: 02/27/2017 20:16   Ct Head Wo Contrast  Result Date: 02/27/2017 CLINICAL DATA:  Pulled by dog and fell, hitting face on ground. EXAM: CT HEAD WITHOUT CONTRAST CT MAXILLOFACIAL WITHOUT CONTRAST CT CERVICAL SPINE WITHOUT CONTRAST TECHNIQUE: Multidetector CT imaging of the head, cervical spine, and maxillofacial structures were performed using the standard protocol without intravenous contrast. Multiplanar CT image reconstructions of the cervical spine and maxillofacial structures were also generated. COMPARISON:  Head CT 08/21/2013 FINDINGS: CT HEAD FINDINGS Brain: There is no evidence for acute hemorrhage, hydrocephalus, mass lesion, or abnormal extra-axial fluid collection. No definite CT evidence for acute infarction. Vascular: No hyperdense vessel or unexpected calcification. Skull: No evidence for fracture. No worrisome lytic or sclerotic lesion. Other: None. CT MAXILLOFACIAL FINDINGS Osseous: No fracture or mandibular dislocation. No destructive process. Orbits: Negative. No traumatic or inflammatory finding. Sinuses: Clear. Soft tissues:  Negative. CT CERVICAL SPINE FINDINGS Alignment: Straightening of normal cervical lordosis. No subluxation. Skull base and vertebrae: No acute fracture. No primary bone lesion or focal pathologic process. Soft tissues and spinal canal: No prevertebral fluid or swelling. No visible canal hematoma. Disc levels:  Preserved. Upper chest: Negative. Other: None. IMPRESSION: 1. Normal CT evaluation of the brain. 2. No evidence for acute traumatic bony abnormality in the maxillofacial bony anatomy. 3. No cervical spine fracture. Loss of cervical lordosis. This can be related to patient positioning, muscle spasm or soft tissue injury. Electronically Signed   By: Kennith Center M.D.   On: 02/27/2017 20:09   Ct Cervical Spine Wo Contrast  Result Date:  02/27/2017 CLINICAL DATA:  Pulled by dog and fell, hitting face on ground. EXAM: CT HEAD WITHOUT CONTRAST CT MAXILLOFACIAL WITHOUT CONTRAST CT CERVICAL SPINE WITHOUT CONTRAST TECHNIQUE: Multidetector CT imaging of the head, cervical spine, and maxillofacial structures were performed using the standard protocol without intravenous contrast. Multiplanar CT image reconstructions of the cervical spine and maxillofacial structures were also generated. COMPARISON:  Head CT 08/21/2013 FINDINGS: CT HEAD FINDINGS Brain: There is no evidence for acute hemorrhage, hydrocephalus, mass lesion, or abnormal extra-axial fluid collection. No definite CT evidence for acute infarction. Vascular: No hyperdense vessel or unexpected calcification. Skull: No evidence for fracture. No worrisome lytic or sclerotic lesion. Other: None. CT MAXILLOFACIAL FINDINGS Osseous: No fracture or mandibular dislocation. No destructive process. Orbits: Negative. No traumatic or inflammatory finding. Sinuses: Clear. Soft tissues: Negative. CT CERVICAL SPINE FINDINGS Alignment: Straightening of normal cervical lordosis. No subluxation. Skull base and vertebrae: No acute fracture. No primary bone lesion or focal pathologic process. Soft tissues and spinal canal: No prevertebral fluid or swelling. No visible canal hematoma. Disc levels:  Preserved. Upper chest: Negative. Other: None. IMPRESSION: 1. Normal CT evaluation of the brain. 2. No evidence for acute traumatic bony abnormality in the maxillofacial bony anatomy. 3. No cervical spine fracture. Loss of cervical lordosis. This can be related to patient positioning, muscle spasm or soft tissue injury. Electronically Signed   By: Kennith Center M.D.   On: 02/27/2017 20:09   Dg Knee Complete 4 Views Left  Result Date: 02/27/2017 CLINICAL DATA:  36 year old who fell at home while doing something with her dog. Sternal chest pain, right hand pain and bilateral knee pain. Initial encounter. EXAM: LEFT KNEE -  COMPLETE 4+ VIEW COMPARISON:  None. FINDINGS: No evidence of acute fracture or dislocation. Well-preserved joint spaces. Well-preserved bone mineral density. No intrinsic osseous abnormality. No visible joint effusion. IMPRESSION: Normal examination. Electronically Signed   By: Hulan Saas M.D.   On: 02/27/2017 20:17   Dg Knee Complete 4 Views Right  Result Date: 02/27/2017 CLINICAL DATA:  36 year old who fell at home while doing something with her dog. Sternal chest pain, right hand pain and bilateral knee pain. Initial encounter. EXAM: RIGHT KNEE - COMPLETE 4+ VIEW COMPARISON:  MRI right knee 10/19/2008. FINDINGS: No evidence of acute fracture or dislocation. Well-preserved joint spaces. Well-preserved bone mineral density. No intrinsic osseous abnormality. No visible joint effusion. IMPRESSION: Normal examination. Electronically Signed   By: Hulan Saas M.D.   On: 02/27/2017 20:18   Dg Hand Complete Right  Result Date: 02/27/2017 CLINICAL DATA:  36 year old who fell at home while doing something with her dog. Sternal chest pain, right hand pain and bilateral knee pain. Initial encounter. EXAM: RIGHT HAND - COMPLETE 3+ VIEW COMPARISON:  None. FINDINGS: No evidence of acute fracture or dislocation. Joint spaces  well preserved. Well-preserved bone mineral density. No intrinsic osseous abnormalities. IMPRESSION: Normal examination. Electronically Signed   By: Hulan Saas M.D.   On: 02/27/2017 20:17   Ct Maxillofacial Wo Contrast  Result Date: 02/27/2017 CLINICAL DATA:  Pulled by dog and fell, hitting face on ground. EXAM: CT HEAD WITHOUT CONTRAST CT MAXILLOFACIAL WITHOUT CONTRAST CT CERVICAL SPINE WITHOUT CONTRAST TECHNIQUE: Multidetector CT imaging of the head, cervical spine, and maxillofacial structures were performed using the standard protocol without intravenous contrast. Multiplanar CT image reconstructions of the cervical spine and maxillofacial structures were also generated.  COMPARISON:  Head CT 08/21/2013 FINDINGS: CT HEAD FINDINGS Brain: There is no evidence for acute hemorrhage, hydrocephalus, mass lesion, or abnormal extra-axial fluid collection. No definite CT evidence for acute infarction. Vascular: No hyperdense vessel or unexpected calcification. Skull: No evidence for fracture. No worrisome lytic or sclerotic lesion. Other: None. CT MAXILLOFACIAL FINDINGS Osseous: No fracture or mandibular dislocation. No destructive process. Orbits: Negative. No traumatic or inflammatory finding. Sinuses: Clear. Soft tissues: Negative. CT CERVICAL SPINE FINDINGS Alignment: Straightening of normal cervical lordosis. No subluxation. Skull base and vertebrae: No acute fracture. No primary bone lesion or focal pathologic process. Soft tissues and spinal canal: No prevertebral fluid or swelling. No visible canal hematoma. Disc levels:  Preserved. Upper chest: Negative. Other: None. IMPRESSION: 1. Normal CT evaluation of the brain. 2. No evidence for acute traumatic bony abnormality in the maxillofacial bony anatomy. 3. No cervical spine fracture. Loss of cervical lordosis. This can be related to patient positioning, muscle spasm or soft tissue injury. Electronically Signed   By: Kennith Center M.D.   On: 02/27/2017 20:09    ____________________________________________    PROCEDURES  Procedure(s) performed:    Procedures    Medications - No data to display   ____________________________________________   INITIAL IMPRESSION / ASSESSMENT AND PLAN / ED COURSE  Pertinent labs & imaging results that were available during my care of the patient were reviewed by me and considered in my medical decision making (see chart for details).  Review of the [place] CSRS was performed in accordance of the [facility] prior to dispensing any controlled drugs.     Patient's diagnosis is consistent with probably resulting in contusion of the face, contusion of the hands, knees, chest wall. Patient had  some mild facial droop on exam. CT scan reveals no acute intracranial osseous abnormality. Exams are reassuring. On reassessment, patient is neurologically intact, equal facial symmetry with swelling and frowning. It appears the patient has mild droop from inflammation/ecchymosis/contusion of the trigeminal and facial nerve in this region. No other concerning findings. At this time, no indication for further workup.. Patient will be discharged home with prescriptions for anti-inflammatory muscle relaxer for symptom control. Patient is to follow up with primary care as needed or otherwise directed. Patient is given ED precautions to return to the ED for any worsening or new symptoms.     ____________________________________________  FINAL CLINICAL IMPRESSION(S) / ED DIAGNOSES  Final diagnoses:  Fall, initial encounter  Contusion of face, initial encounter  Abrasion of face, initial encounter  Contusion of right hand, initial encounter  Abrasion of left hand, initial encounter  Abrasion of right hand, initial encounter  Contusion of left knee, initial encounter  Contusion of right knee, initial encounter  Contusion of chest wall, unspecified laterality, initial encounter      NEW MEDICATIONS STARTED DURING THIS VISIT:  New Prescriptions   MELOXICAM (MOBIC) 15 MG TABLET    Take 1 tablet (  15 mg total) by mouth daily.   METHOCARBAMOL (ROBAXIN) 500 MG TABLET    Take 1 tablet (500 mg total) by mouth 4 (four) times daily.        This chart was dictated using voice recognition software/Dragon. Despite best efforts to proofread, errors can occur which can change the meaning. Any change was purely unintentional.    Racheal Patches, PA-C 02/27/17 2133    Minna Antis, MD 02/27/17 [phone]

## 2017-02-27 NOTE — ED Triage Notes (Signed)
Pt reports neighbors dog got out and was helping get dog back in house.  Dog pulled and pt fell landing on face.  Abrasion noted to right cheek.  No LOC.  C/o pain to teeth.  Pain to both hands and both knees.  Pain to chest from landing on it; few faint scratches noted.  All pain worse when moving.  Denies nausea and vomiting. Feels like hard to focus with right eye.  Basically pt describes generalized pain.

## 2017-02-27 NOTE — ED Notes (Signed)
Pt ambulatory to toilet

## 2017-04-04 ENCOUNTER — Telehealth: Payer: Self-pay | Admitting: Pharmacist

## 2017-04-04 NOTE — Telephone Encounter (Signed)
04/04/17 Faxed Re Enrollment application to [employer] for Advair 250/50 Inhale 1 puff into the lungs every 12 hour, rinse mouth and spit after each use; & Ventolin HFA 90mcg Inhale 2 puffs into the lungs every 6 hours as needed for wheezing.[name]

## 2017-04-25 ENCOUNTER — Encounter: Payer: Self-pay | Admitting: Emergency Medicine

## 2017-04-25 ENCOUNTER — Inpatient Hospital Stay
Admission: EM | Admit: 2017-04-25 | Discharge: 2017-04-26 | DRG: 691 | Disposition: A | Discharge status: Home / Self Care | Payer: Self-pay | Attending: Internal Medicine | Admitting: Internal Medicine

## 2017-04-25 ENCOUNTER — Inpatient Hospital Stay: Payer: Self-pay

## 2017-04-25 ENCOUNTER — Emergency Department: Payer: Self-pay

## 2017-04-25 DIAGNOSIS — Z888 Allergy status to other drugs, medicaments and biological substances status: Secondary | ICD-10-CM

## 2017-04-25 DIAGNOSIS — Z791 Long term (current) use of non-steroidal anti-inflammatories (NSAID): Secondary | ICD-10-CM

## 2017-04-25 DIAGNOSIS — Z6841 Body Mass Index (BMI) 40.0 and over, adult: Secondary | ICD-10-CM

## 2017-04-25 DIAGNOSIS — Z87442 Personal history of urinary calculi: Secondary | ICD-10-CM

## 2017-04-25 DIAGNOSIS — Z882 Allergy status to sulfonamides status: Secondary | ICD-10-CM

## 2017-04-25 DIAGNOSIS — N132 Hydronephrosis with renal and ureteral calculous obstruction: Principal | ICD-10-CM | POA: Diagnosis present

## 2017-04-25 DIAGNOSIS — N200 Calculus of kidney: Secondary | ICD-10-CM

## 2017-04-25 DIAGNOSIS — Z881 Allergy status to other antibiotic agents status: Secondary | ICD-10-CM

## 2017-04-25 DIAGNOSIS — N201 Calculus of ureter: Secondary | ICD-10-CM

## 2017-04-25 DIAGNOSIS — E860 Dehydration: Secondary | ICD-10-CM | POA: Diagnosis present

## 2017-04-25 HISTORY — DX: Calculus of kidney: N20.0

## 2017-04-25 LAB — URINALYSIS, COMPLETE (UACMP) WITH MICROSCOPIC
BACTERIA UA: NONE SEEN
Bilirubin Urine: NEGATIVE
GLUCOSE, UA: NEGATIVE mg/dL
HGB URINE DIPSTICK: NEGATIVE
KETONES UR: NEGATIVE mg/dL
LEUKOCYTES UA: NEGATIVE
NITRITE: NEGATIVE
PROTEIN: NEGATIVE mg/dL
RBC / HPF: NONE SEEN RBC/hpf (ref 0–5)
Specific Gravity, Urine: 1.027 (ref 1.005–1.030)
pH: 5 (ref 5.0–8.0)

## 2017-04-25 LAB — BASIC METABOLIC PANEL
Anion gap: 11 (ref 5–15)
BUN: 22 mg/dL — AB (ref 6–20)
CO2: 22 mmol/L (ref 22–32)
CREATININE: 0.9 mg/dL (ref 0.44–1.00)
Calcium: 9.1 mg/dL (ref 8.9–10.3)
Chloride: 107 mmol/L (ref 101–111)
Glucose, Bld: 127 mg/dL — ABNORMAL HIGH (ref 65–99)
POTASSIUM: 3.5 mmol/L (ref 3.5–5.1)
SODIUM: 140 mmol/L (ref 135–145)

## 2017-04-25 LAB — POCT PREGNANCY, URINE: PREG TEST UR: NEGATIVE

## 2017-04-25 LAB — CBC
HCT: 34.8 % — ABNORMAL LOW (ref 35.0–47.0)
Hemoglobin: 11.9 g/dL — ABNORMAL LOW (ref 12.0–16.0)
MCH: 31.1 pg (ref 26.0–34.0)
MCHC: 34.3 g/dL (ref 32.0–36.0)
MCV: 90.6 fL (ref 80.0–100.0)
PLATELETS: 424 10*3/uL (ref 150–440)
RBC: 3.84 MIL/uL (ref 3.80–5.20)
RDW: 13.8 % (ref 11.5–14.5)
WBC: 18 10*3/uL — ABNORMAL HIGH (ref 3.6–11.0)

## 2017-04-25 MED ORDER — LEVOFLOXACIN IN D5W 750 MG/150ML IV SOLN
INTRAVENOUS | Status: AC
  Filled 2017-04-25: qty 150

## 2017-04-25 MED ORDER — HYDROMORPHONE HCL 1 MG/ML IJ SOLN
1.0000 mg | Freq: Once | INTRAMUSCULAR | Status: AC
  Administered 2017-04-25: 1 mg via INTRAVENOUS
  Filled 2017-04-25: qty 1

## 2017-04-25 MED ORDER — FLUOXETINE HCL 20 MG PO CAPS
80.0000 mg | ORAL_CAPSULE | Freq: Every day | ORAL | Status: DC
  Administered 2017-04-25: 80 mg via ORAL
  Filled 2017-04-25 (×2): qty 4

## 2017-04-25 MED ORDER — ONDANSETRON HCL 4 MG/2ML IJ SOLN
4.0000 mg | Freq: Once | INTRAMUSCULAR | Status: DC
  Filled 2017-04-25: qty 2

## 2017-04-25 MED ORDER — TAMSULOSIN HCL 0.4 MG PO CAPS
0.4000 mg | ORAL_CAPSULE | Freq: Every day | ORAL | Status: DC
  Administered 2017-04-25: 0.4 mg via ORAL
  Filled 2017-04-25: qty 1

## 2017-04-25 MED ORDER — ENOXAPARIN SODIUM 40 MG/0.4ML ~~LOC~~ SOLN: 40.0000 mg | Freq: Two times a day (BID) | SUBCUTANEOUS | Status: DC

## 2017-04-25 MED ORDER — TRAMADOL HCL 50 MG PO TABS: 50.0000 mg | ORAL_TABLET | Freq: Four times a day (QID) | ORAL | Status: DC | PRN

## 2017-04-25 MED ORDER — ENOXAPARIN SODIUM 40 MG/0.4ML ~~LOC~~ SOLN: 40.0000 mg | SUBCUTANEOUS | Status: DC

## 2017-04-25 MED ORDER — HYDROCODONE-ACETAMINOPHEN 5-325 MG PO TABS
1.0000 | ORAL_TABLET | Freq: Four times a day (QID) | ORAL | Status: DC | PRN
  Administered 2017-04-25 – 2017-04-26 (×4): 2 via ORAL
  Filled 2017-04-25 (×5): qty 2

## 2017-04-25 MED ORDER — LEVOFLOXACIN IN D5W 750 MG/150ML IV SOLN
750.0000 mg | Freq: Once | INTRAVENOUS | Status: AC
  Administered 2017-04-25: 750 mg via INTRAVENOUS

## 2017-04-25 MED ORDER — LEVOFLOXACIN IN D5W 750 MG/150ML IV SOLN
750.0000 mg | INTRAVENOUS | Status: DC
  Filled 2017-04-25 (×2): qty 150

## 2017-04-25 MED ORDER — FAMOTIDINE 20 MG PO TABS
10.0000 mg | ORAL_TABLET | Freq: Every day | ORAL | Status: DC
  Filled 2017-04-25: qty 1

## 2017-04-25 MED ORDER — MORPHINE SULFATE (PF) 2 MG/ML IV SOLN
2.0000 mg | INTRAVENOUS | Status: DC | PRN
  Administered 2017-04-25 – 2017-04-26 (×8): 2 mg via INTRAVENOUS
  Filled 2017-04-25 (×8): qty 1

## 2017-04-25 MED ORDER — ONDANSETRON HCL 4 MG PO TABS
4.0000 mg | ORAL_TABLET | Freq: Four times a day (QID) | ORAL | Status: DC | PRN
  Administered 2017-04-25: 4 mg via ORAL
  Filled 2017-04-25: qty 1

## 2017-04-25 MED ORDER — ACETAMINOPHEN 650 MG RE SUPP: 650.0000 mg | Freq: Four times a day (QID) | RECTAL | Status: DC | PRN

## 2017-04-25 MED ORDER — ONDANSETRON HCL 4 MG/2ML IJ SOLN
4.0000 mg | Freq: Four times a day (QID) | INTRAMUSCULAR | Status: DC | PRN
  Administered 2017-04-25 – 2017-04-26 (×2): 4 mg via INTRAVENOUS
  Filled 2017-04-25 (×2): qty 2

## 2017-04-25 MED ORDER — SODIUM CHLORIDE 0.9 % IV BOLUS (SEPSIS)
1000.0000 mL | Freq: Once | INTRAVENOUS | Status: AC
Start: 2017-04-25 — End: 2017-04-25
  Administered 2017-04-25: 1000 mL via INTRAVENOUS

## 2017-04-25 MED ORDER — MORPHINE SULFATE (PF) 4 MG/ML IV SOLN
4.0000 mg | Freq: Once | INTRAVENOUS | Status: DC
  Filled 2017-04-25: qty 1

## 2017-04-25 MED ORDER — MONTELUKAST SODIUM 10 MG PO TABS
10.0000 mg | ORAL_TABLET | Freq: Every day | ORAL | Status: DC
  Administered 2017-04-25: 10 mg via ORAL
  Filled 2017-04-25: qty 1

## 2017-04-25 MED ORDER — SODIUM CHLORIDE 0.9 % IV SOLN
INTRAVENOUS | Status: DC
  Administered 2017-04-25 – 2017-04-26 (×4): via INTRAVENOUS

## 2017-04-25 MED ORDER — FENTANYL CITRATE (PF) 100 MCG/2ML IJ SOLN
100.0000 ug | Freq: Once | INTRAMUSCULAR | Status: AC
  Administered 2017-04-25: 100 ug via INTRAVENOUS
  Filled 2017-04-25: qty 2

## 2017-04-25 MED ORDER — SALINE SPRAY 0.65 % NA SOLN
1.0000 | NASAL | Status: DC | PRN
  Administered 2017-04-25: 1 via NASAL
  Filled 2017-04-25: qty 44

## 2017-04-25 MED ORDER — PROMETHAZINE HCL 25 MG/ML IJ SOLN
12.5000 mg | Freq: Once | INTRAMUSCULAR | Status: AC
  Administered 2017-04-25: 12.5 mg via INTRAVENOUS
  Filled 2017-04-25: qty 1

## 2017-04-25 MED ORDER — TOPIRAMATE 25 MG PO TABS
250.0000 mg | ORAL_TABLET | Freq: Every day | ORAL | Status: DC
  Administered 2017-04-25: 250 mg via ORAL
  Filled 2017-04-25 (×2): qty 2

## 2017-04-25 MED ORDER — TOPIRAMATE 100 MG PO TABS
100.0000 mg | ORAL_TABLET | Freq: Two times a day (BID) | ORAL | Status: DC
  Filled 2017-04-25 (×2): qty 1

## 2017-04-25 MED ORDER — ACETAMINOPHEN 325 MG PO TABS: 650.0000 mg | ORAL_TABLET | Freq: Four times a day (QID) | ORAL | Status: DC | PRN

## 2017-04-25 NOTE — Progress Notes (Signed)
Anticoagulation monitoring(Lovenox):  35yo  female ordered Lovenox 40 mg Q24h  Filed Weights   04/25/17 0344 04/25/17 0654  Weight: 260 lb (117.9 kg) 279 lb 1.6 oz (126.6 kg)   Body mass index is 42.44 kg/m.    Lab Results  Component Value Date   CREATININE 0.90 04/25/2017   CREATININE 0.62 08/25/2015   CREATININE 0.81 09/03/2014   Estimated Creatinine Clearance: 121.4 mL/min (by C-G formula based on SCr of 0.9 mg/dL). Hemoglobin & Hematocrit     Component Value Date/Time   HGB 11.9 (L) 04/25/2017 0407   HGB 13.3 09/03/2014 2058   HCT 34.8 (L) 04/25/2017 0407   HCT 40.4 09/03/2014 2058     Per Protocol for Patient with estCrcl > 30 ml/min and BMI > 40, will transition to Lovenox 40 mg Q12h.

## 2017-04-25 NOTE — OR Nursing (Signed)
Patient is scheduled for Extracorporeal Shockwave Lithotripsy in the morning. One of the medication requirements for this treatment, according to [facility] is that the Lovenox must be discontinued 12 hours prior to the procedure.  She is scheduled to begin at 9:30 am on 04/26/17. The [facility] staff rarely allows for any discrepancy from this protocol. Left voice message on Dr. Assunta Gambles' office machine informing her of this information. Spoke with Kenney Houseman, RN , nurse taking care of this patient to explain the situation. Lovenox could be given but for safety purposes of the litho, it should not be given later than 8pm.  Thank you for taking care of this situation.

## 2017-04-25 NOTE — Progress Notes (Addendum)
Initial Nutrition Assessment  DOCUMENTATION CODES:   Morbid obesity  INTERVENTION:   Snacks   NUTRITION DIAGNOSIS:   Inadequate oral intake related to acute illness (kideney stones) as evidenced by meal completion < 50%.  GOAL:   Patient will meet greater than or equal to 90% of their needs  MONITOR:   PO intake, Labs, Weight trends, I & O's  REASON FOR ASSESSMENT:   Malnutrition Screening Tool    ASSESSMENT:   36 y.o. female admitted on 04/25/2017 with UTI/kidney stone.   Met with pt in room today. Pt reports good appetite and oral intake up until 2 days pta. Pt reports decreased appetite and oral intake for 2 days r/t nausea and vomiting. Pt reports that she has had stones before but she has never had them analyzed. Pt reports intentional recent wt loss; she has been doing "Herbal Life" to loose weight. Pt is lactose intolerant and is unable to eat soy products. She also reports that products made from whey upset her stomach. Pt is eating <50% of her meals in hospital so RD will order snacks to help pt meet estimated needs. Pt reports that she has been told to avoid all meats, soy products, peanuts, and cheese. RD educated pt regarding the importance of diet and kidney stones. RD first recommended to find out what kinds of stones she was developing so that she could better adjust her diet. Avoiding foods can cause pt to become malnourished and may possibly lead to deficiencies. Pt should consume foods in moderation. Weight loss would also be beneficial to pt as obesity is a risk factor for kidney stones. Per MD note pt to have lithotripsy tomorrow.     Medications reviewed and include: lovenox, pepcid, NaCl '@150ml' /hr, morphine, zofran   Labs reviewed: BUN 22(H) Wbc- 18.0(H)  Nutrition-Focused physical exam completed. Findings are no fat depletion, no muscle depletion, and no edema.   Diet Order:  Diet Heart Room service appropriate? Yes; Fluid consistency: Thin Diet NPO time  specified  Skin:  Reviewed, no issues  Last BM:  PTA  Height:   Ht Readings from Last 1 Encounters:  04/25/17 '5\' 8"'  (1.727 m)    Weight:   Wt Readings from Last 1 Encounters:  04/25/17 279 lb 1.6 oz (126.6 kg)    Ideal Body Weight:  63.6 kg  BMI:  Body mass index is 42.44 kg/m.  Estimated Nutritional Needs:   Kcal:  2000-2300kcal/day   Protein:  101-139g/day   Fluid:  >2.5L/day   EDUCATION NEEDS:   Education needs addressed  Koleen Distance MS, RD, LDN Pager #[phone] After Hours Pager: [phone]

## 2017-04-25 NOTE — ED Triage Notes (Signed)
Patient with complaint of right flank pain, nausea and urinary frequency that started at 19:00 last night. Patient states that she has taken hydrocodone and phenergan at home with no relief. Patient has a history of kidney stones.

## 2017-04-25 NOTE — Consult Note (Signed)
Urology Consult  I have been asked to see the patient by Dr. Juliene Pina, for evaluation and management of right ureteral stone.  Chief Complaint: right flank pain  History of Present Illness: Kelsey Bell is a 36 y.o. year old with a history of nephrolithiasis who presented overnight to the emergency room with severe right flank pain. CT scan showed a 5 mm right UVJ stone with moderate hydroureteronephrosis. In the emergency room, her pain was unable to be controlled and she was admitted for observation.  She denies any fevers or chills. She has experienced severe nausea with 1 episode of emesis. No dysuria or gross hematuria.  She reports that she has a personal history of stones. She is passed 2 stones in her lifetime, one approximately 2 weeks ago. Following this, her pain resolved but started again 2 days ago. It is since intensified.  She's never required surgical intervention for stones.  Past Medical History:  Diagnosis Date  . Breast pain present for several months   Bil LT >RT across of breasts  . Kidney stones     Past Surgical History:  Procedure Laterality Date  . KNEE ARTHROSCOPY    . TONSILLECTOMY      Home Medications:  Current Meds  Medication Sig  . Cranberry-Vitamin C-Probiotic (AZO CRANBERRY) 250-30 MG TABS Take by mouth.  Marland Kitchen FLUoxetine (PROZAC) 40 MG capsule Take 80 mg by mouth daily.  Marland Kitchen HYDROcodone-acetaminophen (NORCO/VICODIN) 5-325 MG tablet Take 1 tablet by mouth every 8 (eight) hours as needed.  Marland Kitchen Lysine HCl 1000 MG TABS Take by mouth.  . montelukast (SINGULAIR) 10 MG tablet Take 10 mg by mouth at bedtime.  . Multiple Vitamin (MULTI-VITAMINS) TABS Take 1 tablet by mouth daily.  Marland Kitchen senna (SENOKOT) 8.6 MG tablet Take 1 tablet by mouth daily.  Marland Kitchen topiramate (TOPAMAX) 50 MG tablet Take 250 mg by mouth daily.   . vitamin C (ASCORBIC ACID) 500 MG tablet Take 2 tablets by mouth daily.    Allergies:  Allergies  Allergen Reactions  . Esomeprazole  Anaphylaxis  . Hydroxychloroquine Rash  . Cefdinir Hives  . Prednisone Other (See Comments)    Muscle spasms  . Amoxicillin Rash  . Sulfa Antibiotics Rash    Family History  Problem Relation Age of Onset  . Adopted: Yes    Social History:  reports that she has never smoked. She has never used smokeless tobacco. She reports that she does not drink alcohol or use drugs.  ROS: A complete review of systems was performed.  All systems are negative except for pertinent findings as noted.  Physical Exam:  Vital signs in last 24 hours: Temp:  [97.5 F (36.4 C)-99.1 F (37.3 C)] 99.1 F (37.3 C) (08/22 1231) Pulse Rate:  [78-93] 93 (08/22 1231) Resp:  [18-20] 20 (08/22 1231) BP: (115-135)/(63-99) 128/80 (08/22 1231) SpO2:  [96 %-100 %] 99 % (08/22 1231) Weight:  [260 lb (117.9 kg)-279 lb 1.6 oz (126.6 kg)] 279 lb 1.6 oz (126.6 kg) (08/22 0654) Constitutional:  Alert and oriented, No acute distress HEENT: [place] AT, moist mucus membranes.  Trachea midline, no masses Cardiovascular: Regular rate and rhythm, no clubbing, cyanosis, or edema. Respiratory: Normal respiratory effort, lungs clear bilaterally GI: Abdomen is soft, tender in the right lower quadrant without rebound or guarding. No abdominal masses. GU: Moderate right CVA tenderness. Skin: No rashes, bruises or suspicious lesions Lymph: No cervical or inguinal adenopathy MSK: Tenderness of left ankle without swelling or bruising. Neurologic: Grossly  intact, no focal deficits, moving all 4 extremities Psychiatric: Normal mood and affect   Laboratory Data:   Recent Labs  04/25/17 0407  WBC 18.0*  HGB 11.9*  HCT 34.8*    Recent Labs  04/25/17 0407  NA 140  K 3.5  CL 107  CO2 22  GLUCOSE 127*  BUN 22*  CREATININE 0.90  CALCIUM 9.1   No results for input(s): LABPT, INR in the last 72 hours. No results for input(s): LABURIN in the last 72 hours. Results for orders placed or performed in visit on 10/02/11  Urine  culture     Status: None   Collection Time: 10/02/11  4:42 PM  Result Value Ref Range Status   Micro Text Report   Final       SOURCE: CLEAN CATCH    COMMENT                   MIXED BACTERIAL ORGANISMS   COMMENT                   RESULTS SUGGESTIVE OF CONTAMINATION   ANTIBIOTIC                                                         Radiologic Imaging: Dg Abd 1 View  Result Date: 04/25/2017 CLINICAL DATA:  Abdominal pain.  Ureteral stone. EXAM: ABDOMEN - 1 VIEW COMPARISON:  Abdominal and pelvic noncontrast CT scan dated April 25, 2017 FINDINGS: Patient was found to have a 4 mm right UVJ stone on today's CT scan. This is faintly visible on this KUB. No definite stones project elsewhere over the visualized portions of the right kidney or right ureter. No definite left-sided kidney or ureteral stones are observed. The bowel gas pattern is unremarkable. IMPRESSION: Calcification to the right of the tip of the coccyx compatible with a known right UVJ stone. It appears to measure approximately 3-4 mm on this study. Electronically Signed   By: David  [place] M.D.   On: 04/25/2017 09:40   Ct Renal Stone Study  Result Date: 04/25/2017 CLINICAL DATA:  Right flank pain.  Stone disease suspected. EXAM: CT ABDOMEN AND PELVIS WITHOUT CONTRAST TECHNIQUE: Multidetector CT imaging of the abdomen and pelvis was performed following the standard protocol without IV contrast. COMPARISON:  CT 09/04/2014 FINDINGS: Lower chest: The lung bases are clear. Hepatobiliary: The liver is prominent size spanning 19.6 cm cranial caudal. Borderline hepatic steatosis. Gallbladder physiologically distended, no calcified stone. No biliary dilatation. Pancreas: Fatty atrophy.  No ductal dilatation or inflammation. Spleen: Upper normal in size spanning 12.9 cm. Adrenals/Urinary Tract: Normal adrenal glands. Obstructing 5 x 5 mm stone in the right ureterovesicular junction with moderate hydroureteronephrosis. Minimal right  perinephric edema. No additional nonobstructing stones in either kidney. No left hydronephrosis. Urinary bladder is nondistended. Stomach/Bowel: Stomach is distended with ingested contents. Appendix appears normal. No evidence of bowel wall thickening, distention, or inflammatory changes. Vascular/Lymphatic: Normal caliber abdominal aorta. No abdominal or pelvic adenopathy. Reproductive: Uterus is normal in size, question soft tissue prominence in the region of the cervix/lower uterine segment. The left ovary is prominent size and contains a 3.7 cm cyst, possibly complex. Right ovary not confidently visualized. Other: No free air, free fluid, or intra-abdominal fluid collection. Musculoskeletal: There are no acute or suspicious osseous abnormalities.  IMPRESSION: 1. Obstructing 5 x 5 mm stone at the right ureterovesicular junction with moderate hydroureteronephrosis. 2. Enlarged left ovary containing a 3.7 cm cyst, possibly complex. Recommend nonemergent pelvic ultrasound characterization. Electronically Signed   By: Rubye Oaks M.D.   On: 04/25/2017 04:53    Impression/Plan:  36 year old female with a 5 mm UVJ stone admitted primarily for pain control. This point time, she appears comfortable in her bed but is requiring IV narcotics (dilaudid) for control.  1. Right UVJ stone Based on the size of her stone, she is an excellent chance of passing this medical expulsive therapy. Unfortunately, her pain is poorly controlled. As such, she would like to consider intervention. Options including ESWL versus ureteroscopy were discussed today at length. Risks and benefits as well as the efficacy region each were discussed. Should she has a ureteroscopy is greater than 90% chance of achieving a stone free rate in one procedure versus ESWL which is much lower. Despite this, she is more interested in ESWL due to her concern for ureteral stent.  Plan for IV fluids, flomax, strain urine overnight.  If stone fails to  pass spontaneously, will plan for ESWL for tomorrow.    Stone is visible on KUB today.  2. Right flank pain Continue IV narcotics as needed  Please avoid Toradol in the setting of planned lithotripsy tomorrow  3. Right hydronephrosis Secondary to #1  4. 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 On further discussion, she is been seen and evaluated by hematology at [facility] for this and is noted to be a benign chronic finding for her. As such, with a negative urinalysis and no other signs or symptoms of infection, this appears to be unrelated to the stone.  04/25/2017, 1:15 PM   --> patient was seen and evaluated earlier this morning around 8 AM. Vanna Scotland,  MD

## 2017-04-25 NOTE — Progress Notes (Signed)
Pharmacy Antibiotic Note  Kelsey Bell is a 36 y.o. female admitted on 04/25/2017 with a kidney stone. Pharmacy has been consulted for Levaquin dosing.  Plan: Continue Levaquin 750 mg IV q24h  CrCl: 121.4 ml/min  Height: 5\' 8"  (172.7 cm) Weight: 279 lb 1.6 oz (126.6 kg) IBW/kg (Calculated) : 63.9  Temp (24hrs), Avg:98 F (36.7 C), Min:97.5 F (36.4 C), Max:98.5 F (36.9 C)   Recent Labs Lab 04/25/17 0407  WBC 18.0*  CREATININE 0.90    Estimated Creatinine Clearance: 121.4 mL/min (by C-G formula based on SCr of 0.9 mg/dL).    Allergies  Allergen Reactions  . Esomeprazole Anaphylaxis  . Hydroxychloroquine Rash  . Cefdinir Hives  . Prednisone Other (See Comments)    Muscle spasms  . Amoxicillin Rash  . Sulfa Antibiotics Rash    Antimicrobials this admission: 8/22 Levaquin >>  Microbiology results: None  Thank you for allowing pharmacy to be a part of this patient's care.  Cleopatra Cedar  Pharmacy Resident  04/25/2017 11:24 AM

## 2017-04-25 NOTE — H&P (Signed)
[facility] [place] at [facility]   PATIENT NAME: Kelsey Bell    MR#:  [id number]  DATE OF BIRTH:  1981/05/21  DATE OF ADMISSION:  04/25/2017  PRIMARY CARE PHYSICIAN: Lynett Grimes, PA-C   REQUESTING/REFERRING PHYSICIAN:   CHIEF COMPLAINT:   Chief Complaint  Patient presents with  . Flank Pain    HISTORY OF PRESENT ILLNESS: Kelsey Bell  is a 36 y.o. female with a known history of Nephrolithiasis presented to the emergency room with right flank pain for the last couple of days. Patient has this right flank pain for 1 month on and off but intensified for the last few days. Patient in nature 8 out of 10 on a scale of 1-10. Patient also has some nausea and dry heaves. She was evaluated with a CT scan which showed a stone in the right UV junction with moderate hydronephrosis. Patient was given IV Dilaudid for pain in the emergency room. No complaints of any hematuria. No fever and chills.  PAST MEDICAL HISTORY:   Past Medical History:  Diagnosis Date  . Breast pain present for several months   Bil LT >RT across of breasts  . Kidney stones     PAST SURGICAL HISTORY: Past Surgical History:  Procedure Laterality Date  . KNEE ARTHROSCOPY    . TONSILLECTOMY      SOCIAL HISTORY:  Social History  Substance Use Topics  . Smoking status: Never Smoker  . Smokeless tobacco: Never Used  . Alcohol use No    FAMILY HISTORY:  Family History  Problem Relation Age of Onset  . Adopted: Yes    DRUG ALLERGIES:  Allergies  Allergen Reactions  . Esomeprazole Anaphylaxis  . Hydroxychloroquine Rash  . Cefdinir Hives  . Prednisone Other (See Comments)    Muscle spasms  . Amoxicillin Rash  . Sulfa Antibiotics Rash    REVIEW OF SYSTEMS:   CONSTITUTIONAL: No fever, fatigue or weakness.  EYES: No blurred or double vision.  EARS, NOSE, AND THROAT: No tinnitus or ear pain.  RESPIRATORY: No cough, shortness of breath, wheezing or hemoptysis.   CARDIOVASCULAR: No chest pain, orthopnea, edema.  GASTROINTESTINAL: No nausea, vomiting, diarrhea or abdominal pain.  GENITOURINARY: Has dysuria, no hematuria.  Has right flank pain ENDOCRINE: No polyuria, nocturia,  HEMATOLOGY: No anemia, easy bruising or bleeding SKIN: No rash or lesion. MUSCULOSKELETAL: No joint pain or arthritis.   NEUROLOGIC: No tingling, numbness, weakness.  PSYCHIATRY: No anxiety or depression.   MEDICATIONS AT HOME:  Prior to Admission medications   Medication Sig Start Date End Date Taking? Authorizing Provider  Cranberry-Vitamin C-Probiotic (AZO CRANBERRY) 250-30 MG TABS Take by mouth.   Yes [provider]  FLUoxetine (PROZAC) 40 MG capsule Take 80 mg by mouth daily.   Yes [provider]  HYDROcodone-acetaminophen (NORCO/VICODIN) 5-325 MG tablet Take 1 tablet by mouth every 8 (eight) hours as needed. 04/13/17  Yes [provider]  Lysine HCl 1000 MG TABS Take by mouth.   Yes [provider]  montelukast (SINGULAIR) 10 MG tablet Take 10 mg by mouth at bedtime.   Yes [provider]  Multiple Vitamin (MULTI-VITAMINS) TABS Take 1 tablet by mouth daily.   Yes [provider]  senna (SENOKOT) 8.6 MG tablet Take 1 tablet by mouth daily.   Yes [provider]  topiramate (TOPAMAX) 50 MG tablet Take 250 mg by mouth daily.    Yes [provider]  vitamin C (ASCORBIC ACID) 500 MG  tablet Take 2 tablets by mouth daily.   Yes [provider]  dicyclomine (BENTYL) 20 MG tablet Take 1 tablet (20 mg total) by mouth 3 (three) times daily as needed for spasms. Patient not taking: Reported on 02/23/2016 08/25/15   Sharman Cheek, MD  meloxicam (MOBIC) 15 MG tablet Take 1 tablet (15 mg total) by mouth daily. Patient not taking: Reported on 04/25/2017 02/27/17   Cuthriell, Delorise Royals, PA-C  methocarbamol (ROBAXIN) 500 MG tablet Take 1 tablet (500 mg total) by mouth 4 (four) times daily. Patient not  taking: Reported on 04/25/2017 02/27/17   Cuthriell, Delorise Royals, PA-C  ondansetron (ZOFRAN ODT) 8 MG disintegrating tablet Take 1 tablet (8 mg total) by mouth every 8 (eight) hours as needed for nausea or vomiting. Patient not taking: Reported on 02/23/2016 08/25/15   Sharman Cheek, MD  ranitidine (ZANTAC) 150 MG capsule Take 1 capsule (150 mg total) by mouth 2 (two) times daily. Patient not taking: Reported on 02/23/2016 08/25/15   Sharman Cheek, MD      PHYSICAL EXAMINATION:   VITAL SIGNS: Blood pressure (!) 115/99, pulse 91, temperature (!) 97.5 F (36.4 C), temperature source Oral, resp. rate 18, height 5' 8.5" (1.74 m), weight 117.9 kg (260 lb), last menstrual period 04/11/2017, SpO2 99 %.  GENERAL:  36 y.o.-year-old patient lying in the bed with no acute distress.  EYES: Pupils equal, round, reactive to light and accommodation. No scleral icterus. Extraocular muscles intact.  HEENT: Head atraumatic, normocephalic. Oropharynx and nasopharynx clear.  NECK:  Supple, no jugular venous distention. No thyroid enlargement, no tenderness.  LUNGS: Normal breath sounds bilaterally, no wheezing, rales,rhonchi or crepitation. No use of accessory muscles of respiration.  CARDIOVASCULAR: S1, S2 normal. No murmurs, rubs, or gallops.  ABDOMEN: Soft, tenderness right costo vertebral angle area, nondistended. Bowel sounds present. No organomegaly or mass.  EXTREMITIES: No pedal edema, cyanosis, or clubbing.  NEUROLOGIC: Cranial nerves II through XII are intact. Muscle strength 5/5 in all extremities. Sensation intact. Gait not checked.  PSYCHIATRIC: The patient is alert and oriented x 3.  SKIN: No obvious rash, lesion, or ulcer.   LABORATORY PANEL:   CBC  Recent Labs Lab 04/25/17 0407  WBC 18.0*  HGB 11.9*  HCT 34.8*  PLT 424  MCV 90.6  MCH 31.1  MCHC 34.3  RDW 13.8    ------------------------------------------------------------------------------------------------------------------  Chemistries   Recent Labs Lab 04/25/17 0407  NA 140  K 3.5  CL 107  CO2 22  GLUCOSE 127*  BUN 22*  CREATININE 0.90  CALCIUM 9.1   ------------------------------------------------------------------------------------------------------------------ estimated creatinine clearance is 117.6 mL/min (by C-G formula based on SCr of 0.9 mg/dL). ------------------------------------------------------------------------------------------------------------------ No results for input(s): TSH, T4TOTAL, T3FREE, THYROIDAB in the last 72 hours.  Invalid input(s): FREET3   Coagulation profile No results for input(s): INR, PROTIME in the last 168 hours. ------------------------------------------------------------------------------------------------------------------- No results for input(s): DDIMER in the last 72 hours. -------------------------------------------------------------------------------------------------------------------  Cardiac Enzymes No results for input(s): CKMB, TROPONINI, MYOGLOBIN in the last 168 hours.  Invalid input(s): CK ------------------------------------------------------------------------------------------------------------------ Invalid input(s): POCBNP  ---------------------------------------------------------------------------------------------------------------  Urinalysis    Component Value Date/Time   COLORURINE YELLOW (A) 04/25/2017 0407   APPEARANCEUR HAZY (A) 04/25/2017 0407   APPEARANCEUR Hazy 09/03/2014 2035   LABSPEC 1.027 04/25/2017 0407   LABSPEC 1.026 09/03/2014 2035   PHURINE 5.0 04/25/2017 0407   GLUCOSEU NEGATIVE 04/25/2017 0407   GLUCOSEU Negative 09/03/2014 2035   HGBUR NEGATIVE 04/25/2017 0407   BILIRUBINUR NEGATIVE 04/25/2017 0407   BILIRUBINUR Negative  09/03/2014 2035   KETONESUR NEGATIVE 04/25/2017 0407   PROTEINUR  NEGATIVE 04/25/2017 0407   NITRITE NEGATIVE 04/25/2017 0407   LEUKOCYTESUR NEGATIVE 04/25/2017 0407   LEUKOCYTESUR Negative 09/03/2014 2035     RADIOLOGY: Ct Renal Stone Study  Result Date: 04/25/2017 CLINICAL DATA:  Right flank pain.  Stone disease suspected. EXAM: CT ABDOMEN AND PELVIS WITHOUT CONTRAST TECHNIQUE: Multidetector CT imaging of the abdomen and pelvis was performed following the standard protocol without IV contrast. COMPARISON:  CT 09/04/2014 FINDINGS: Lower chest: The lung bases are clear. Hepatobiliary: The liver is prominent size spanning 19.6 cm cranial caudal. Borderline hepatic steatosis. Gallbladder physiologically distended, no calcified stone. No biliary dilatation. Pancreas: Fatty atrophy.  No ductal dilatation or inflammation. Spleen: Upper normal in size spanning 12.9 cm. Adrenals/Urinary Tract: Normal adrenal glands. Obstructing 5 x 5 mm stone in the right ureterovesicular junction with moderate hydroureteronephrosis. Minimal right perinephric edema. No additional nonobstructing stones in either kidney. No left hydronephrosis. Urinary bladder is nondistended. Stomach/Bowel: Stomach is distended with ingested contents. Appendix appears normal. No evidence of bowel wall thickening, distention, or inflammatory changes. Vascular/Lymphatic: Normal caliber abdominal aorta. No abdominal or pelvic adenopathy. Reproductive: Uterus is normal in size, question soft tissue prominence in the region of the cervix/lower uterine segment. The left ovary is prominent size and contains a 3.7 cm cyst, possibly complex. Right ovary not confidently visualized. Other: No free air, free fluid, or intra-abdominal fluid collection. Musculoskeletal: There are no acute or suspicious osseous abnormalities. IMPRESSION: 1. Obstructing 5 x 5 mm stone at the right ureterovesicular junction with moderate hydroureteronephrosis. 2. Enlarged left ovary containing a 3.7 cm cyst, possibly complex. Recommend  nonemergent pelvic ultrasound characterization. Electronically Signed   By: Rubye Oaks M.D.   On: 04/25/2017 04:53    EKG: Orders placed or performed in visit on 01/15/14  . EKG 12-Lead    IMPRESSION AND PLAN: 36 year old female patient with history of nephrolithiasis presented to the emergency room with right flank pain. Admitting diagnosis 1. Right ureteral stone with hydronephrosis 2. Right flank pain 3. Dehydration 4. Leukocytosis Treatment plan Admit patient to medical floor IV fluid hydration Pain management with IV Dilaudid Urology consultation Start patient on IV Levaquin antibiotic Follow-up WBC count  All the records are reviewed and case discussed with ED provider. Management plans discussed with the patient, family and they are in agreement.  CODE STATUS:FULL CODE Code Status History    This patient does not have a recorded code status. Please follow your organizational policy for patients in this situation.       TOTAL TIME TAKING CARE OF THIS PATIENT: 50 minutes.    Ihor Austin M.D on 04/25/2017 at 6:36 AM  Between 7am to 6pm - Pager - [phone]  After 6pm go to [email or web address] - password [email or web address] [facility]  [facility]  Office  [phone]  CC: Primary care physician; Lynett Grimes, PA-C

## 2017-04-25 NOTE — Progress Notes (Signed)
Patient was seen and evaluated. Agree with current plan as outlined by admitting M.D. Patient reports that plan is for lithotripsy tomorrow. She has been evaluated by urology. Continue IV fluids.

## 2017-04-25 NOTE — Progress Notes (Signed)
Pharmacy Antibiotic Note  Kelsey Bell is a 36 y.o. female admitted on 04/25/2017 with UTI/kidney stone.  Pharmacy has been consulted for Levaquin dosing.  Plan: Levaquin 750 mg IV daily ordered.  Height: 5' 8.5" (174 cm) Weight: 260 lb (117.9 kg) IBW/kg (Calculated) : 65.05  Temp (24hrs), Avg:97.5 F (36.4 C), Min:97.5 F (36.4 C), Max:97.5 F (36.4 C)   Recent Labs Lab 04/25/17 0407  WBC 18.0*  CREATININE 0.90    Estimated Creatinine Clearance: 117.6 mL/min (by C-G formula based on SCr of 0.9 mg/dL).    Allergies  Allergen Reactions  . Esomeprazole Anaphylaxis  . Hydroxychloroquine Rash  . Cefdinir Hives  . Prednisone Other (See Comments)    Muscle spasms  . Amoxicillin Rash  . Sulfa Antibiotics Rash    Antimicrobials this admission: Levofloxacin 8/22 >>    >>   Dose adjustments this admission:   Microbiology results: No micro      8/22 UA: (-) Thank you for allowing pharmacy to be a part of this patient'Bell care.  Kelsey Bell 04/25/2017 6:19 AM

## 2017-04-25 NOTE — ED Provider Notes (Signed)
[facility] Emergency Department Provider Note  ____________________________________________   First MD Initiated Contact with Patient 04/25/17 [phone]     (approximate)  I have reviewed the triage vital signs and the nursing notes.   HISTORY  Chief Complaint Flank Pain   HPI Kelsey Bell is a 36 y.o. female with a history of kidney stones was presenting with right flank pain that started at 7 PM yesterday. Says the pain is a 10 out of 10 and has been intermittent and sharp. She does complain of diffuse abdominal pain and nausea. Does not report any vaginal bleeding or discharge. Denies blood in her urine. Does not report any diarrhea. Does not report fever. Says that she has never had her stone disease confirmed via imaging.   Past Medical History:  Diagnosis Date  . Breast pain present for several months   Bil LT >RT across of breasts  . Kidney stones     There are no active problems to display for this patient.   Past Surgical History:  Procedure Laterality Date  . KNEE ARTHROSCOPY    . TONSILLECTOMY      Prior to Admission medications   Medication Sig Start Date End Date Taking? Authorizing Provider  dicyclomine (BENTYL) 20 MG tablet Take 1 tablet (20 mg total) by mouth 3 (three) times daily as needed for spasms. Patient not taking: Reported on 02/23/2016 08/25/15   Sharman Cheek, MD  FLUoxetine (PROZAC) 40 MG capsule Take 80 mg by mouth daily.    [provider]  meloxicam (MOBIC) 15 MG tablet Take 1 tablet (15 mg total) by mouth daily. 02/27/17   Cuthriell, Delorise Royals, PA-C  methocarbamol (ROBAXIN) 500 MG tablet Take 1 tablet (500 mg total) by mouth 4 (four) times daily. 02/27/17   Cuthriell, Delorise Royals, PA-C  montelukast (SINGULAIR) 10 MG tablet Take 10 mg by mouth at bedtime.    [provider]  ondansetron (ZOFRAN ODT) 8 MG disintegrating tablet Take 1 tablet (8 mg total) by mouth every 8 (eight) hours as needed for  nausea or vomiting. Patient not taking: Reported on 02/23/2016 08/25/15   Sharman Cheek, MD  ranitidine (ZANTAC) 150 MG capsule Take 1 capsule (150 mg total) by mouth 2 (two) times daily. Patient not taking: Reported on 02/23/2016 08/25/15   Sharman Cheek, MD  senna (SENOKOT) 8.6 MG tablet Take 1 tablet by mouth daily.    [provider]  topiramate (TOPAMAX) 100 MG tablet Take 100 mg by mouth 2 (two) times daily.    [provider]  traMADol (ULTRAM) 50 MG tablet Take by mouth every 6 (six) hours as needed.    [provider]    Allergies Esomeprazole; Hydroxychloroquine; Cefdinir; Prednisone; Amoxicillin; and Sulfa antibiotics  No family history on file.  Social History Social History  Substance Use Topics  . Smoking status: Never Smoker  . Smokeless tobacco: Never Used  . Alcohol use No    Review of Systems  Constitutional: No fever/chills Eyes: No visual changes. ENT: No sore throat. Cardiovascular: Denies chest pain. Respiratory: Denies shortness of breath. Gastrointestinal: no vomiting.  No diarrhea.  No constipation. Genitourinary: Negative for dysuria. Musculoskeletal: right sided low back pain. Skin: Negative for rash. Neurological: Negative for headaches, focal weakness or numbness.   ____________________________________________   PHYSICAL EXAM:  VITAL SIGNS: ED Triage Vitals  Enc Vitals Group     BP 04/25/17 0344 118/71     Pulse Rate 04/25/17 0343 86  Resp 04/25/17 0343 18     Temp 04/25/17 0343 (!) 97.5 F (36.4 C)     Temp Source 04/25/17 0343 Oral     SpO2 04/25/17 0343 96 %     Weight 04/25/17 0344 260 lb (117.9 kg)     Height 04/25/17 0344 5' 8.5" (1.74 m)     Head Circumference --      Peak Flow --      Pain Score 04/25/17 0343 10     Pain Loc --      Pain Edu? --      Excl. in GC? --     Constitutional: Alert and oriented. Appears uncomfortable Eyes: Conjunctivae are normal.  Head: Atraumatic. Nose:  No congestion/rhinnorhea. Mouth/Throat: Mucous membranes are moist.  Neck: No stridor.   Cardiovascular: Normal rate, regular rhythm. Grossly normal heart sounds.   Respiratory: Normal respiratory effort.  No retractions. Lungs CTAB. Gastrointestinal: Soft with mild-to-moderate diffuse tenderness to palpation. Negative Murphy sign. No distention.  Right-sided CVA tenderness to palpation.  Musculoskeletal: No lower extremity tenderness nor edema.  No joint effusions. Neurologic:  Normal speech and language. No gross focal neurologic deficits are appreciated. Skin:  Skin is warm, dry and intact. No rash noted. Psychiatric: Mood and affect are normal. Speech and behavior are normal.  ____________________________________________   LABS (all labs ordered are listed, but only abnormal results are displayed)  Labs Reviewed  URINALYSIS, COMPLETE (UACMP) WITH MICROSCOPIC - Abnormal; Notable for the following:       Result Value   Color, Urine YELLOW (*)    APPearance HAZY (*)    Squamous Epithelial / LPF 0-5 (*)    All other components within normal limits  CBC - Abnormal; Notable for the following:    WBC 18.0 (*)    Hemoglobin 11.9 (*)    HCT 34.8 (*)    All other components within normal limits  BASIC METABOLIC PANEL - Abnormal; Notable for the following:    Glucose, Bld 127 (*)    BUN 22 (*)    All other components within normal limits  POC URINE PREG, ED  POCT PREGNANCY, URINE   ____________________________________________  EKG   ____________________________________________  RADIOLOGY  5 mm right UVJ stone. Also with a left-sided ovarian cyst. ____________________________________________   PROCEDURES  Procedure(s) performed:   Procedures  Critical Care performed:   ____________________________________________   INITIAL IMPRESSION / ASSESSMENT AND PLAN / ED COURSE  Pertinent labs & imaging results that were available during my care of the patient were reviewed  by me and considered in my medical decision making (see chart for details).  Patient reports that her complex regional pain syndrome is located to the lower extremities and she does not of abdominal or back pain related to her CRP S.    ----------------------------------------- 5:57 AM on 04/25/2017 -----------------------------------------  Patient says that she is a persistent 8 out of 10 pain despite fentanyl and Dilaudid. She'll be admitted to the hospital for further pain control. Signed out to Dr.Pyreddy.  Patient is understanding of the diagnosis as well as the treatment plan and went to comply.  ____________________________________________   FINAL CLINICAL IMPRESSION(S) / ED DIAGNOSES  Kidney stone.    NEW MEDICATIONS STARTED DURING THIS VISIT:  New Prescriptions   No medications on file     Note:  This document was prepared using Dragon voice recognition software and may include unintentional dictation errors.     Myrna Blazer, MD 04/25/17 [phone]

## 2017-04-26 ENCOUNTER — Encounter
Admission: EM | Disposition: A | Discharge status: Home / Self Care | Payer: Self-pay | Source: Home / Self Care | Attending: Internal Medicine

## 2017-04-26 ENCOUNTER — Other Ambulatory Visit: Payer: Self-pay | Admitting: Radiology

## 2017-04-26 HISTORY — PX: EXTRACORPOREAL SHOCK WAVE LITHOTRIPSY: SHX1557

## 2017-04-26 LAB — BASIC METABOLIC PANEL
Anion gap: 7 (ref 5–15)
BUN: 14 mg/dL (ref 6–20)
CHLORIDE: 112 mmol/L — AB (ref 101–111)
CO2: 19 mmol/L — AB (ref 22–32)
Calcium: 8.3 mg/dL — ABNORMAL LOW (ref 8.9–10.3)
Creatinine, Ser: 1.13 mg/dL — ABNORMAL HIGH (ref 0.44–1.00)
GFR calc Af Amer: >60 mL/min (ref >60)
GFR calc non Af Amer: >60 mL/min (ref >60)
Glucose, Bld: 126 mg/dL — ABNORMAL HIGH (ref 65–99)
POTASSIUM: 3.7 mmol/L (ref 3.5–5.1)
SODIUM: 138 mmol/L (ref 135–145)

## 2017-04-26 LAB — CBC
HEMATOCRIT: 35.1 % (ref 35.0–47.0)
Hemoglobin: 11.7 g/dL — ABNORMAL LOW (ref 12.0–16.0)
MCH: 31.2 pg (ref 26.0–34.0)
MCHC: 33.3 g/dL (ref 32.0–36.0)
MCV: 93.8 fL (ref 80.0–100.0)
Platelets: 346 10*3/uL (ref 150–440)
RBC: 3.75 MIL/uL — AB (ref 3.80–5.20)
RDW: 13.8 % (ref 11.5–14.5)
WBC: 14.8 10*3/uL — AB (ref 3.6–11.0)

## 2017-04-26 SURGERY — LITHOTRIPSY, ESWL
Anesthesia: Moderate Sedation | Laterality: Right

## 2017-04-26 MED ORDER — DIPHENHYDRAMINE HCL 25 MG PO CAPS
25.0000 mg | ORAL_CAPSULE | Freq: Once | ORAL | Status: AC
  Administered 2017-04-26: 25 mg via ORAL

## 2017-04-26 MED ORDER — DIAZEPAM 5 MG PO TABS
10.0000 mg | ORAL_TABLET | Freq: Once | ORAL | Status: AC
  Administered 2017-04-26: 10 mg via ORAL

## 2017-04-26 MED ORDER — PROMETHAZINE HCL 25 MG/ML IJ SOLN: 25.0000 mg | Freq: Four times a day (QID) | INTRAMUSCULAR | Status: DC | PRN

## 2017-04-26 MED ORDER — DIAZEPAM 5 MG PO TABS
ORAL_TABLET | ORAL | Status: AC
  Administered 2017-04-26: 10 mg via ORAL
  Filled 2017-04-26: qty 2

## 2017-04-26 MED ORDER — TAMSULOSIN HCL 0.4 MG PO CAPS: 0.4000 mg | ORAL_CAPSULE | Freq: Every day | ORAL | 0 refills | Status: DC

## 2017-04-26 MED ORDER — DIPHENHYDRAMINE HCL 25 MG PO CAPS
ORAL_CAPSULE | ORAL | Status: AC
  Administered 2017-04-26: 25 mg via ORAL
  Filled 2017-04-26: qty 1

## 2017-04-26 MED ORDER — ONDANSETRON HCL 4 MG/2ML IJ SOLN: 4.0000 mg | Freq: Once | INTRAMUSCULAR | Status: DC

## 2017-04-26 MED ORDER — DOCUSATE SODIUM 100 MG PO CAPS: 100.0000 mg | ORAL_CAPSULE | Freq: Two times a day (BID) | ORAL | Status: DC

## 2017-04-26 MED ORDER — HYDROCODONE-ACETAMINOPHEN 5-325 MG PO TABS: 1.0000 | ORAL_TABLET | Freq: Four times a day (QID) | ORAL | 0 refills | Status: DC | PRN

## 2017-04-26 MED ORDER — POLYETHYLENE GLYCOL 3350 17 G PO PACK: 17.0000 g | PACK | Freq: Every day | ORAL | Status: DC

## 2017-04-26 NOTE — Discharge Summary (Signed)
[facility] - [place] [facility]   PATIENT NAME: Kelsey Bell    MR#:  [id number]  DATE OF BIRTH:  1980/12/22  DATE OF ADMISSION:  04/25/2017 ADMITTING PHYSICIAN: Ihor Austin, MD  DATE OF DISCHARGE: 04/26/2017  PRIMARY CARE PHYSICIAN: Lynett Grimes, PA-C    ADMISSION DIAGNOSIS:  Kidney stone [N20.0]  DISCHARGE DIAGNOSIS:  Active Problems:   Kidney stone   Ureteral stone   SECONDARY DIAGNOSIS:   Past Medical History:  Diagnosis Date  . Breast pain present for several months   Bil LT >RT across of breasts  . Kidney stones     HOSPITAL COURSE:   36 year old female with a history of kidney stones who presented with right-sided flank pain and found to have Obstructing 5 x 5 mm stone at the right ureterovesicular junction with moderate hydroureteronephrosis.  1. Obstructing 5 x 5 mm stone at the right ureterovesicular junction with moderate hydroureteronephrosis: Patient was evaluated by urology. She is status post lithotripsy. She had no signs of urinary tract infection. She will follow-up with urology in 1 week.     DISCHARGE CONDITIONS AND DIET:   Stable for discharge and regular diet  CONSULTS OBTAINED:  Treatment Team:  Vanna Scotland, MD  DRUG ALLERGIES:   Allergies  Allergen Reactions  . Esomeprazole Anaphylaxis  . Hydroxychloroquine Rash  . Cefdinir Hives  . Prednisone Other (See Comments)    Muscle spasms  . Amoxicillin Rash  . Sulfa Antibiotics Rash    DISCHARGE MEDICATIONS:   Current Discharge Medication List    START taking these medications   Details  tamsulosin (FLOMAX) 0.4 MG CAPS capsule Take 1 capsule (0.4 mg total) by mouth daily. Qty: 30 capsule, Refills: 0      CONTINUE these medications which have CHANGED   Details  HYDROcodone-acetaminophen (NORCO) 5-325 MG tablet Take 1 tablet by mouth every 6 (six) hours as needed for moderate pain. Qty: 15 tablet, Refills: 0      CONTINUE these medications which  have NOT CHANGED   Details  Cranberry-Vitamin C-Probiotic (AZO CRANBERRY) 250-30 MG TABS Take by mouth.    FLUoxetine (PROZAC) 40 MG capsule Take 80 mg by mouth daily.    Lysine HCl 1000 MG TABS Take by mouth.    montelukast (SINGULAIR) 10 MG tablet Take 10 mg by mouth at bedtime.    Multiple Vitamin (MULTI-VITAMINS) TABS Take 1 tablet by mouth daily.    senna (SENOKOT) 8.6 MG tablet Take 1 tablet by mouth daily.    topiramate (TOPAMAX) 50 MG tablet Take 250 mg by mouth daily.     vitamin C (ASCORBIC ACID) 500 MG tablet Take 2 tablets by mouth daily.      STOP taking these medications     dicyclomine (BENTYL) 20 MG tablet      meloxicam (MOBIC) 15 MG tablet      methocarbamol (ROBAXIN) 500 MG tablet      ondansetron (ZOFRAN ODT) 8 MG disintegrating tablet      ranitidine (ZANTAC) 150 MG capsule      traMADol (ULTRAM) 50 MG tablet           Today   CHIEF COMPLAINT:  Patient having flank pain this morning prior to lithotripsy   VITAL SIGNS:  Blood pressure (!) 119/57, pulse 81, temperature 98.3 F (36.8 C), temperature source Oral, resp. rate 20, height 5\' 8"  (1.727 m), weight 126.6 kg (279 lb 1.6 oz), last menstrual period 04/11/2017, SpO2 100 %.   REVIEW OF  SYSTEMS:  Review of Systems  Constitutional: Negative.  Negative for chills, fever and malaise/fatigue.  HENT: Negative.  Negative for ear discharge, ear pain, hearing loss, nosebleeds and sore throat.   Eyes: Negative.  Negative for blurred vision and pain.  Respiratory: Negative.  Negative for cough, hemoptysis, shortness of breath and wheezing.   Cardiovascular: Negative.  Negative for chest pain, palpitations and leg swelling.  Gastrointestinal: Negative.  Negative for abdominal pain, blood in stool, diarrhea, nausea and vomiting.  Genitourinary: Positive for flank pain. Negative for dysuria.  Musculoskeletal: Negative for back pain.  Skin: Negative.   Neurological: Negative for dizziness, tremors,  speech change, focal weakness, seizures and headaches.  Endo/Heme/Allergies: Negative.  Does not bruise/bleed easily.  Psychiatric/Behavioral: Negative.  Negative for depression, hallucinations and suicidal ideas.     PHYSICAL EXAMINATION:   GENERAL:  36 y.o.-year-old patient lying in the bed with no acute distress.  NECK:  Supple, no jugular venous distention. No thyroid enlargement, no tenderness.  LUNGS: Normal breath sounds bilaterally, no wheezing, rales,rhonchi  No use of accessory muscles of respiration.  CARDIOVASCULAR: S1, S2 normal. No murmurs, rubs, or gallops.  ABDOMEN: Soft, non-tender, non-distended. Bowel sounds present. No organomegaly or mass.  Positive right-sided flank pain EXTREMITIES: No pedal edema, cyanosis, or clubbing.  PSYCHIATRIC: The patient is alert and oriented x 3.  SKIN: No obvious rash, lesion, or ulcer.   DATA REVIEW:   CBC  Recent Labs Lab 04/26/17 0431  WBC 14.8*  HGB 11.7*  HCT 35.1  PLT 346    Chemistries   Recent Labs Lab 04/26/17 0431  NA 138  K 3.7  CL 112*  CO2 19*  GLUCOSE 126*  BUN 14  CREATININE 1.13*  CALCIUM 8.3*    Cardiac Enzymes No results for input(s): TROPONINI in the last 168 hours.  Microbiology Results  @MICRORSLT48 @  RADIOLOGY:  Dg Abd 1 View  Result Date: 04/25/2017 CLINICAL DATA:  Abdominal pain.  Ureteral stone. EXAM: ABDOMEN - 1 VIEW COMPARISON:  Abdominal and pelvic noncontrast CT scan dated April 25, 2017 FINDINGS: Patient was found to have a 4 mm right UVJ stone on today's CT scan. This is faintly visible on this KUB. No definite stones project elsewhere over the visualized portions of the right kidney or right ureter. No definite left-sided kidney or ureteral stones are observed. The bowel gas pattern is unremarkable. IMPRESSION: Calcification to the right of the tip of the coccyx compatible with a known right UVJ stone. It appears to measure approximately 3-4 mm on this study. Electronically  Signed   By: David  [place] M.D.   On: 04/25/2017 09:40   Ct Renal Stone Study  Result Date: 04/25/2017 CLINICAL DATA:  Right flank pain.  Stone disease suspected. EXAM: CT ABDOMEN AND PELVIS WITHOUT CONTRAST TECHNIQUE: Multidetector CT imaging of the abdomen and pelvis was performed following the standard protocol without IV contrast. COMPARISON:  CT 09/04/2014 FINDINGS: Lower chest: The lung bases are clear. Hepatobiliary: The liver is prominent size spanning 19.6 cm cranial caudal. Borderline hepatic steatosis. Gallbladder physiologically distended, no calcified stone. No biliary dilatation. Pancreas: Fatty atrophy.  No ductal dilatation or inflammation. Spleen: Upper normal in size spanning 12.9 cm. Adrenals/Urinary Tract: Normal adrenal glands. Obstructing 5 x 5 mm stone in the right ureterovesicular junction with moderate hydroureteronephrosis. Minimal right perinephric edema. No additional nonobstructing stones in either kidney. No left hydronephrosis. Urinary bladder is nondistended. Stomach/Bowel: Stomach is distended with ingested contents. Appendix appears normal. No evidence of bowel wall  thickening, distention, or inflammatory changes. Vascular/Lymphatic: Normal caliber abdominal aorta. No abdominal or pelvic adenopathy. Reproductive: Uterus is normal in size, question soft tissue prominence in the region of the cervix/lower uterine segment. The left ovary is prominent size and contains a 3.7 cm cyst, possibly complex. Right ovary not confidently visualized. Other: No free air, free fluid, or intra-abdominal fluid collection. Musculoskeletal: There are no acute or suspicious osseous abnormalities. IMPRESSION: 1. Obstructing 5 x 5 mm stone at the right ureterovesicular junction with moderate hydroureteronephrosis. 2. Enlarged left ovary containing a 3.7 cm cyst, possibly complex. Recommend nonemergent pelvic ultrasound characterization. Electronically Signed   By: Rubye Oaks M.D.   On:  04/25/2017 04:53      Current Discharge Medication List    START taking these medications   Details  tamsulosin (FLOMAX) 0.4 MG CAPS capsule Take 1 capsule (0.4 mg total) by mouth daily. Qty: 30 capsule, Refills: 0      CONTINUE these medications which have CHANGED   Details  HYDROcodone-acetaminophen (NORCO) 5-325 MG tablet Take 1 tablet by mouth every 6 (six) hours as needed for moderate pain. Qty: 15 tablet, Refills: 0      CONTINUE these medications which have NOT CHANGED   Details  Cranberry-Vitamin C-Probiotic (AZO CRANBERRY) 250-30 MG TABS Take by mouth.    FLUoxetine (PROZAC) 40 MG capsule Take 80 mg by mouth daily.    Lysine HCl 1000 MG TABS Take by mouth.    montelukast (SINGULAIR) 10 MG tablet Take 10 mg by mouth at bedtime.    Multiple Vitamin (MULTI-VITAMINS) TABS Take 1 tablet by mouth daily.    senna (SENOKOT) 8.6 MG tablet Take 1 tablet by mouth daily.    topiramate (TOPAMAX) 50 MG tablet Take 250 mg by mouth daily.     vitamin C (ASCORBIC ACID) 500 MG tablet Take 2 tablets by mouth daily.      STOP taking these medications     dicyclomine (BENTYL) 20 MG tablet      meloxicam (MOBIC) 15 MG tablet      methocarbamol (ROBAXIN) 500 MG tablet      ondansetron (ZOFRAN ODT) 8 MG disintegrating tablet      ranitidine (ZANTAC) 150 MG capsule      traMADol (ULTRAM) 50 MG tablet          Management plans discussed with the patient and she is in agreement. Stable for discharge home  Patient should follow up with pcp  CODE STATUS:     Code Status Orders        Start     Ordered   04/25/17 0647  Full code  Continuous     04/25/17 0646    Code Status History    Date Active Date Inactive Code Status Order ID Comments User Context   This patient has a current code status but no historical code status.      TOTAL TIME TAKING CARE OF THIS PATIENT: 37 minutes.    Note: This dictation was prepared with Dragon dictation along with smaller  phrase technology. Any transcriptional errors that result from this process are unintentional.  Demari Gales M.D on 04/26/2017 at 7:51 AM  Between 7am to 6pm - Pager - [phone] After 6pm go to [email or web address] - Social research officer, government  [facility]  Office  [phone]  CC: Primary care physician; Lynett Grimes, PA-C

## 2017-04-26 NOTE — Interval H&P Note (Signed)
History and Physical Interval Note:  04/26/2017 10:21 AM  Kelsey Bell  has presented today for surgery, with the diagnosis of Kidney stone  The various methods of treatment have been discussed with the patient and family. After consideration of risks, benefits and other options for treatment, the patient has consented to  Procedure(s): EXTRACORPOREAL SHOCK WAVE LITHOTRIPSY (ESWL) (Right) as a surgical intervention .  The patient's history has been reviewed, patient examined, no change in status, stable for surgery.  I have reviewed the patient's chart and labs.  Questions were answered to the patient's satisfaction.    Continues to have pain this AM.     Vanna Scotland

## 2017-04-26 NOTE — H&P (View-Only) (Signed)
Urology Consult  I have been asked to see the patient by Dr. Juliene Pina, for evaluation and management of right ureteral stone.  Chief Complaint: right flank pain  History of Present Illness: Kelsey Bell is a 36 y.o. year old with a history of nephrolithiasis who presented overnight to the emergency room with severe right flank pain. CT scan showed a 5 mm right UVJ stone with moderate hydroureteronephrosis. In the emergency room, her pain was unable to be controlled and she was admitted for observation.  She denies any fevers or chills. She has experienced severe nausea with 1 episode of emesis. No dysuria or gross hematuria.  She reports that she has a personal history of stones. She is passed 2 stones in her lifetime, one approximately 2 weeks ago. Following this, her pain resolved but started again 2 days ago. It is since intensified.  She's never required surgical intervention for stones.  Past Medical History:  Diagnosis Date  . Breast pain present for several months   Bil LT >RT across of breasts  . Kidney stones     Past Surgical History:  Procedure Laterality Date  . KNEE ARTHROSCOPY    . TONSILLECTOMY      Home Medications:  Current Meds  Medication Sig  . Cranberry-Vitamin C-Probiotic (AZO CRANBERRY) 250-30 MG TABS Take by mouth.  Marland Kitchen FLUoxetine (PROZAC) 40 MG capsule Take 80 mg by mouth daily.  Marland Kitchen HYDROcodone-acetaminophen (NORCO/VICODIN) 5-325 MG tablet Take 1 tablet by mouth every 8 (eight) hours as needed.  Marland Kitchen Lysine HCl 1000 MG TABS Take by mouth.  . montelukast (SINGULAIR) 10 MG tablet Take 10 mg by mouth at bedtime.  . Multiple Vitamin (MULTI-VITAMINS) TABS Take 1 tablet by mouth daily.  Marland Kitchen senna (SENOKOT) 8.6 MG tablet Take 1 tablet by mouth daily.  Marland Kitchen topiramate (TOPAMAX) 50 MG tablet Take 250 mg by mouth daily.   . vitamin C (ASCORBIC ACID) 500 MG tablet Take 2 tablets by mouth daily.    Allergies:  Allergies  Allergen Reactions  . Esomeprazole  Anaphylaxis  . Hydroxychloroquine Rash  . Cefdinir Hives  . Prednisone Other (See Comments)    Muscle spasms  . Amoxicillin Rash  . Sulfa Antibiotics Rash    Family History  Problem Relation Age of Onset  . Adopted: Yes    Social History:  reports that she has never smoked. She has never used smokeless tobacco. She reports that she does not drink alcohol or use drugs.  ROS: A complete review of systems was performed.  All systems are negative except for pertinent findings as noted.  Physical Exam:  Vital signs in last 24 hours: Temp:  [97.5 F (36.4 C)-99.1 F (37.3 C)] 99.1 F (37.3 C) (08/22 1231) Pulse Rate:  [78-93] 93 (08/22 1231) Resp:  [18-20] 20 (08/22 1231) BP: (115-135)/(63-99) 128/80 (08/22 1231) SpO2:  [96 %-100 %] 99 % (08/22 1231) Weight:  [260 lb (117.9 kg)-279 lb 1.6 oz (126.6 kg)] 279 lb 1.6 oz (126.6 kg) (08/22 0654) Constitutional:  Alert and oriented, No acute distress HEENT: [place] AT, moist mucus membranes.  Trachea midline, no masses Cardiovascular: Regular rate and rhythm, no clubbing, cyanosis, or edema. Respiratory: Normal respiratory effort, lungs clear bilaterally GI: Abdomen is soft, tender in the right lower quadrant without rebound or guarding. No abdominal masses. GU: Moderate right CVA tenderness. Skin: No rashes, bruises or suspicious lesions Lymph: No cervical or inguinal adenopathy MSK: Tenderness of left ankle without swelling or bruising. Neurologic: Grossly  intact, no focal deficits, moving all 4 extremities Psychiatric: Normal mood and affect   Laboratory Data:   Recent Labs  04/25/17 0407  WBC 18.0*  HGB 11.9*  HCT 34.8*    Recent Labs  04/25/17 0407  NA 140  K 3.5  CL 107  CO2 22  GLUCOSE 127*  BUN 22*  CREATININE 0.90  CALCIUM 9.1   No results for input(s): LABPT, INR in the last 72 hours. No results for input(s): LABURIN in the last 72 hours. Results for orders placed or performed in visit on 10/02/11  Urine  culture     Status: None   Collection Time: 10/02/11  4:42 PM  Result Value Ref Range Status   Micro Text Report   Final       SOURCE: CLEAN CATCH    COMMENT                   MIXED BACTERIAL ORGANISMS   COMMENT                   RESULTS SUGGESTIVE OF CONTAMINATION   ANTIBIOTIC                                                         Radiologic Imaging: Dg Abd 1 View  Result Date: 04/25/2017 CLINICAL DATA:  Abdominal pain.  Ureteral stone. EXAM: ABDOMEN - 1 VIEW COMPARISON:  Abdominal and pelvic noncontrast CT scan dated April 25, 2017 FINDINGS: Patient was found to have a 4 mm right UVJ stone on today's CT scan. This is faintly visible on this KUB. No definite stones project elsewhere over the visualized portions of the right kidney or right ureter. No definite left-sided kidney or ureteral stones are observed. The bowel gas pattern is unremarkable. IMPRESSION: Calcification to the right of the tip of the coccyx compatible with a known right UVJ stone. It appears to measure approximately 3-4 mm on this study. Electronically Signed   By: David  [place] M.D.   On: 04/25/2017 09:40   Ct Renal Stone Study  Result Date: 04/25/2017 CLINICAL DATA:  Right flank pain.  Stone disease suspected. EXAM: CT ABDOMEN AND PELVIS WITHOUT CONTRAST TECHNIQUE: Multidetector CT imaging of the abdomen and pelvis was performed following the standard protocol without IV contrast. COMPARISON:  CT 09/04/2014 FINDINGS: Lower chest: The lung bases are clear. Hepatobiliary: The liver is prominent size spanning 19.6 cm cranial caudal. Borderline hepatic steatosis. Gallbladder physiologically distended, no calcified stone. No biliary dilatation. Pancreas: Fatty atrophy.  No ductal dilatation or inflammation. Spleen: Upper normal in size spanning 12.9 cm. Adrenals/Urinary Tract: Normal adrenal glands. Obstructing 5 x 5 mm stone in the right ureterovesicular junction with moderate hydroureteronephrosis. Minimal right  perinephric edema. No additional nonobstructing stones in either kidney. No left hydronephrosis. Urinary bladder is nondistended. Stomach/Bowel: Stomach is distended with ingested contents. Appendix appears normal. No evidence of bowel wall thickening, distention, or inflammatory changes. Vascular/Lymphatic: Normal caliber abdominal aorta. No abdominal or pelvic adenopathy. Reproductive: Uterus is normal in size, question soft tissue prominence in the region of the cervix/lower uterine segment. The left ovary is prominent size and contains a 3.7 cm cyst, possibly complex. Right ovary not confidently visualized. Other: No free air, free fluid, or intra-abdominal fluid collection. Musculoskeletal: There are no acute or suspicious osseous abnormalities.  IMPRESSION: 1. Obstructing 5 x 5 mm stone at the right ureterovesicular junction with moderate hydroureteronephrosis. 2. Enlarged left ovary containing a 3.7 cm cyst, possibly complex. Recommend nonemergent pelvic ultrasound characterization. Electronically Signed   By: Rubye Oaks M.D.   On: 04/25/2017 04:53    Impression/Plan:  36 year old female with a 5 mm UVJ stone admitted primarily for pain control. This point time, she appears comfortable in her bed but is requiring IV narcotics (dilaudid) for control.  1. Right UVJ stone Based on the size of her stone, she is an excellent chance of passing this medical expulsive therapy. Unfortunately, her pain is poorly controlled. As such, she would like to consider intervention. Options including ESWL versus ureteroscopy were discussed today at length. Risks and benefits as well as the efficacy region each were discussed. Should she has a ureteroscopy is greater than 90% chance of achieving a stone free rate in one procedure versus ESWL which is much lower. Despite this, she is more interested in ESWL due to her concern for ureteral stent.  Plan for IV fluids, flomax, strain urine overnight.  If stone fails to  pass spontaneously, will plan for ESWL for tomorrow.    Stone is visible on KUB today.  2. Right flank pain Continue IV narcotics as needed  Please avoid Toradol in the setting of planned lithotripsy tomorrow  3. Right hydronephrosis Secondary to #1  4. 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 On further discussion, she is been seen and evaluated by hematology at [facility] for this and is noted to be a benign chronic finding for her. As such, with a negative urinalysis and no other signs or symptoms of infection, this appears to be unrelated to the stone.  04/25/2017, 1:15 PM   --> patient was seen and evaluated earlier this morning around 8 AM. Vanna Scotland,  MD

## 2017-04-26 NOTE — Progress Notes (Signed)
[facility] - [place] [facility]   PATIENT NAME: Kelsey Bell    MR#:  [id number]  DATE OF BIRTH:  27-Jul-1981  SUBJECTIVE:   Patient having flank pain.  REVIEW OF SYSTEMS:    Review of Systems  Constitutional: Negative for fever, chills weight loss HENT: Negative for ear pain, nosebleeds, congestion, facial swelling, rhinorrhea, neck pain, neck stiffness and ear discharge.   Respiratory: Negative for cough, shortness of breath, wheezing  Cardiovascular: Negative for chest pain, palpitations and leg swelling.  Gastrointestinal: Negative for heartburn, abdominal pain, vomiting, diarrhea or consitpation Positive flank pain Genitourinary: Negative for dysuria, urgency, frequency, hematuria Musculoskeletal: Negative for back pain or joint pain Neurological: Negative for dizziness, seizures, syncope, focal weakness,  numbness and headaches.  Hematological: Does not bruise/bleed easily.  Psychiatric/Behavioral: Negative for hallucinations, confusion, dysphoric mood    Tolerating Diet: npo      DRUG ALLERGIES:   Allergies  Allergen Reactions  . Esomeprazole Anaphylaxis  . Hydroxychloroquine Rash  . Cefdinir Hives  . Prednisone Other (See Comments)    Muscle spasms  . Amoxicillin Rash  . Sulfa Antibiotics Rash    VITALS:  Blood pressure (!) 119/57, pulse 81, temperature 98.3 F (36.8 C), temperature source Oral, resp. rate 20, height 5\' 8"  (1.727 m), weight 126.6 kg (279 lb 1.6 oz), last menstrual period 04/11/2017, SpO2 100 %.  PHYSICAL EXAMINATION:  Constitutional: Appears well-developed and well-nourished. No distress. HENT: Normocephalic. Marland Kitchen Oropharynx is clear and moist.  Eyes: Conjunctivae and EOM are normal. PERRLA, no scleral icterus.  Neck: Normal ROM. Neck supple. No JVD. No tracheal deviation. CVS: RRR, S1/S2 +, no murmurs, no gallops, no carotid bruit.  Pulmonary: Effort and breath sounds normal, no stridor, rhonchi, wheezes, rales.   Abdominal: Soft. BS +,  no distension, tenderness, rebound or guarding.  Musculoskeletal: Normal range of motion. No edema and no tenderness.  Neuro: Alert. CN 2-12 grossly intact. No focal deficits. Skin: Skin is warm and dry. No rash noted. Psychiatric: Normal mood and affect.   Back: CVA tenderness right side   LABORATORY PANEL:   CBC  Recent Labs Lab 04/26/17 0431  WBC 14.8*  HGB 11.7*  HCT 35.1  PLT 346   ------------------------------------------------------------------------------------------------------------------  Chemistries   Recent Labs Lab 04/26/17 0431  NA 138  K 3.7  CL 112*  CO2 19*  GLUCOSE 126*  BUN 14  CREATININE 1.13*  CALCIUM 8.3*   ------------------------------------------------------------------------------------------------------------------  Cardiac Enzymes No results for input(s): TROPONINI in the last 168 hours. ------------------------------------------------------------------------------------------------------------------  RADIOLOGY:  Dg Abd 1 View  Result Date: 04/25/2017 CLINICAL DATA:  Abdominal pain.  Ureteral stone. EXAM: ABDOMEN - 1 VIEW COMPARISON:  Abdominal and pelvic noncontrast CT scan dated April 25, 2017 FINDINGS: Patient was found to have a 4 mm right UVJ stone on today's CT scan. This is faintly visible on this KUB. No definite stones project elsewhere over the visualized portions of the right kidney or right ureter. No definite left-sided kidney or ureteral stones are observed. The bowel gas pattern is unremarkable. IMPRESSION: Calcification to the right of the tip of the coccyx compatible with a known right UVJ stone. It appears to measure approximately 3-4 mm on this study. Electronically Signed   By: David  [place] M.D.   On: 04/25/2017 09:40   Ct Renal Stone Study  Result Date: 04/25/2017 CLINICAL DATA:  Right flank pain.  Stone disease suspected. EXAM: CT ABDOMEN AND PELVIS WITHOUT CONTRAST TECHNIQUE:  Multidetector CT imaging of the abdomen  and pelvis was performed following the standard protocol without IV contrast. COMPARISON:  CT 09/04/2014 FINDINGS: Lower chest: The lung bases are clear. Hepatobiliary: The liver is prominent size spanning 19.6 cm cranial caudal. Borderline hepatic steatosis. Gallbladder physiologically distended, no calcified stone. No biliary dilatation. Pancreas: Fatty atrophy.  No ductal dilatation or inflammation. Spleen: Upper normal in size spanning 12.9 cm. Adrenals/Urinary Tract: Normal adrenal glands. Obstructing 5 x 5 mm stone in the right ureterovesicular junction with moderate hydroureteronephrosis. Minimal right perinephric edema. No additional nonobstructing stones in either kidney. No left hydronephrosis. Urinary bladder is nondistended. Stomach/Bowel: Stomach is distended with ingested contents. Appendix appears normal. No evidence of bowel wall thickening, distention, or inflammatory changes. Vascular/Lymphatic: Normal caliber abdominal aorta. No abdominal or pelvic adenopathy. Reproductive: Uterus is normal in size, question soft tissue prominence in the region of the cervix/lower uterine segment. The left ovary is prominent size and contains a 3.7 cm cyst, possibly complex. Right ovary not confidently visualized. Other: No free air, free fluid, or intra-abdominal fluid collection. Musculoskeletal: There are no acute or suspicious osseous abnormalities. IMPRESSION: 1. Obstructing 5 x 5 mm stone at the right ureterovesicular junction with moderate hydroureteronephrosis. 2. Enlarged left ovary containing a 3.7 cm cyst, possibly complex. Recommend nonemergent pelvic ultrasound characterization. Electronically Signed   By: Rubye Oaks M.D.   On: 04/25/2017 04:53     ASSESSMENT AND PLAN:   36 year old female with a history of kidney stones who presented with right-sided flank pain and found to have Obstructing 5 x 5 mm stone at the right ureterovesicular junction with  moderate hydroureteronephrosis.  1. Obstructing 5 x 5 mm stone at the right ureterovesicular junction with moderate hydroureteronephrosis: Patient was evaluated by urology.  She will undergo lithotripsy.  Patient does not need antibiotic as per my conversation this morning with Dr. Apolinar Junes.   Continue Flomax She will follow-up with urology in 1 week.      Management plans discussed with the patient and she is in agreement.  CODE STATUS: full  TOTAL TIME TAKING CARE OF THIS PATIENT: 30 minutes.     POSSIBLE D/C today, DEPENDING ON CLINICAL CONDITION.   Domnic Vantol M.D on 04/26/2017 at 7:53 AM  Between 7am to 6pm - Pager - [phone] After 6pm go to [email or web address] - password [email or web address] [facility]  [facility]  Office  [phone]  CC: Primary care physician; Lynett Grimes, PA-C  Note: This dictation was prepared with Dragon dictation along with smaller phrase technology. Any transcriptional errors that result from this process are unintentional.

## 2017-04-26 NOTE — Progress Notes (Signed)
Pt to be discharged per MD order. IV removed. Instructions reviewed with pt and all questions were answered. Scripts given to pt. Will be discharged in wheelchair.

## 2017-04-26 NOTE — Plan of Care (Signed)
Problem: Fluid Volume: Goal: Ability to maintain a balanced intake and output will improve Outcome: Not Progressing Patient is NPO.  Problem: Nutrition: Goal: Adequate nutrition will be maintained Outcome: Not Progressing Patient is NPO.   

## 2017-04-26 NOTE — OR Nursing (Signed)
2 C notified pt will be returning (10:40 am ) in approx 10 min.

## 2017-04-27 ENCOUNTER — Encounter: Payer: Self-pay | Admitting: Urology

## 2017-04-30 ENCOUNTER — Telehealth: Payer: Self-pay

## 2017-04-30 NOTE — Telephone Encounter (Signed)
Pt called stating she has developed a fever of 99 and is having severe pain. Reinforced with pt can take tylenol/ibuprofen/pain medication and apply heat to the area. Also reinforced with pt to continue to push fluids and take flomax to help pass the fragments. Pt voiced understanding.

## 2017-05-09 ENCOUNTER — Other Ambulatory Visit: Payer: Self-pay | Admitting: Urology

## 2017-05-09 ENCOUNTER — Telehealth: Payer: Self-pay | Admitting: Urology

## 2017-05-09 DIAGNOSIS — Z87442 Personal history of urinary calculi: Secondary | ICD-10-CM

## 2017-05-09 NOTE — Progress Notes (Signed)
05/10/2017 2:35 PM   Kelsey Bell 05/09/1981 [id number]  Referring provider: Lynett GrimesNelson, Sarah M, PA-C [address] [facility], [place] [id number]  Chief Complaint  Patient presents with  . New Patient (Initial Visit)    post ESWL seen through ER    HPI: Patient is a 36 year old Caucasian female who is status post right ESWL on 04/26/2017 for a right 5 mm UVJ stone.    Background history Kelsey MonsLauren B Bell is a 36 y.o. year old with a history of nephrolithiasis who presented overnight to the emergency room with severe right flank pain. CT scan showed a 5 mm right UVJ stone with moderate hydroureteronephrosis. In the emergency room, her pain was unable to be controlled and she was admitted for observation.  She denies any fevers or chills. She has experienced severe nausea with 1 episode of emesis. No dysuria or gross hematuria.  She reports that she has a personal history of stones. She is passed 2 stones in her lifetime, one approximately 2 weeks ago. Following this, her pain resolved but started again 2 days ago. It is since intensified.  She's never required surgical intervention for stones.  Her pain could not be controlled so she was admitted overnight and underwent ESWL the next day.  Since that time, she is having frequency, urgency, dysuria and nocturia   She is seeing blood on her tissue paper when she wipes.  Her UA is unremarkable.   KUB taken today demonstrates a right fragment in the region of the UVJ.      PMH: Past Medical History:  Diagnosis Date  . Breast pain present for several months   Bil LT >RT across of breasts  . Kidney stones     Surgical History: Past Surgical History:  Procedure Laterality Date  . EXTRACORPOREAL SHOCK WAVE LITHOTRIPSY Right 04/26/2017   Procedure: EXTRACORPOREAL SHOCK WAVE LITHOTRIPSY (ESWL);  Surgeon: Vanna ScotlandBrandon, Ashley, MD;  Location: [facility] ORS;  Service: Urology;  Laterality: Right;  . KNEE ARTHROSCOPY Right   . TONSILLECTOMY       Home Medications:  Allergies as of 05/10/2017      Reactions   Cefdinir Hives   Esomeprazole Anaphylaxis   Esomeprazole Magnesium Anaphylaxis   Hydroxychloroquine Rash, Shortness Of Breath   Nsaids Nausea Only, Other (See Comments)   Other: acid reflux   Sumatriptan Succinate Other (See Comments)   Cortisone Other (See Comments)   pain pain   Prednisone Other (See Comments)   Muscle spasms   Tapentadol Other (See Comments)   Body aches and pains, and itching. Swelling.   Amoxicillin Rash   Amoxicillin-pot Clavulanate Nausea And Vomiting, Other (See Comments)   GI symptoms and UTI   Doxycycline Rash   possible skin rash after stopping medication- rash resolved within minutes.    Sulfa Antibiotics Rash   Sulfasalazine Rash      Medication List       Accurate as of 05/10/17  2:35 PM. Always use your most recent med list.          acetaminophen 500 MG tablet Commonly known as:  TYLENOL Take by mouth.   ALPRAZolam 0.25 MG tablet Commonly known as:  XANAX Take by mouth.   AZO CRANBERRY 250-30 MG Tabs Take by mouth.   cyclobenzaprine 5 MG tablet Commonly known as:  FLEXERIL Take by mouth.   FLUoxetine 40 MG capsule Commonly known as:  PROZAC Take 80 mg by mouth daily.   HYDROcodone-acetaminophen 5-325 MG  tablet Commonly known as:  NORCO Take 1 tablet by mouth every 6 (six) hours as needed for moderate pain.   Lysine HCl 1000 MG Tabs Take by mouth.   montelukast 10 MG tablet Commonly known as:  SINGULAIR Take 10 mg by mouth at bedtime.   MULTI-VITAMINS Tabs Take 1 tablet by mouth daily.   senna 8.6 MG tablet Commonly known as:  SENOKOT Take 1 tablet by mouth daily.   sucralfate 1 g tablet Commonly known as:  CARAFATE Take one tablet twice daily with meals and before bedtime PRN. Do not take with you vitamin D.   tamsulosin 0.4 MG Caps capsule Commonly known as:  FLOMAX Take 1 capsule (0.4 mg total) by mouth daily.   topiramate 50 MG  tablet Commonly known as:  TOPAMAX Take 250 mg by mouth daily.   vitamin C 500 MG tablet Commonly known as:  ASCORBIC ACID Take 2 tablets by mouth daily.   Vitamin D3 5000 units Tabs Take by mouth.            Discharge Care Instructions        Start     Ordered   05/10/17 0000  Urinalysis, Complete     05/10/17 1347      Allergies:  Allergies  Allergen Reactions  . Cefdinir Hives  . Esomeprazole Anaphylaxis  . Esomeprazole Magnesium Anaphylaxis  . Hydroxychloroquine Rash and Shortness Of Breath  . Nsaids Nausea Only and Other (See Comments)    Other: acid reflux  . Sumatriptan Succinate Other (See Comments)  . Cortisone Other (See Comments)    pain pain  . Prednisone Other (See Comments)    Muscle spasms  . Tapentadol Other (See Comments)    Body aches and pains, and itching. Swelling.  . Amoxicillin Rash  . Amoxicillin-Pot Clavulanate Nausea And Vomiting and Other (See Comments)    GI symptoms and UTI  . Doxycycline Rash    possible skin rash after stopping medication- rash resolved within minutes.   . Sulfa Antibiotics Rash  . Sulfasalazine Rash    Family History: Family History  Problem Relation Age of Onset  . Adopted: Yes  . Kidney cancer Neg Hx   . Bladder Cancer Neg Hx     Social History:  reports that she has never smoked. She has never used smokeless tobacco. She reports that she does not drink alcohol or use drugs.  ROS: UROLOGY Frequent Urination?: Yes Hard to postpone urination?: Yes Burning/pain with urination?: Yes Get up at night to urinate?: Yes Leakage of urine?: No Urine stream starts and stops?: No Trouble starting stream?: No Do you have to strain to urinate?: No Blood in urine?: Yes Urinary tract infection?: Yes Sexually transmitted disease?: No Injury to kidneys or bladder?: No Painful intercourse?: No Weak stream?: No Currently pregnant?: No Vaginal bleeding?: No Last menstrual period?:  05/06/2017  Gastrointestinal Nausea?: Yes Vomiting?: No Indigestion/heartburn?: No Diarrhea?: Yes Constipation?: Yes  Constitutional Fever: Yes Night sweats?: No Weight loss?: Yes Fatigue?: Yes  Skin Skin rash/lesions?: No Itching?: No  Eyes Blurred vision?: No Double vision?: No  Ears/Nose/Throat Sore throat?: Yes Sinus problems?: No  Hematologic/Lymphatic Swollen glands?: No Easy bruising?: No  Cardiovascular Leg swelling?: Yes Chest pain?: No  Respiratory Cough?: No Shortness of breath?: No  Endocrine Excessive thirst?: No  Musculoskeletal Back pain?: Yes Joint pain?: No  Neurological Headaches?: No Dizziness?: No  Psychologic Depression?: Yes Anxiety?: Yes  Physical Exam: BP 139/86   Pulse (!) 108  Temp 98.3 F (36.8 C) (Oral)   Ht 5' 8.5" (1.74 m)   Wt 290 lb 6.4 oz (131.7 kg)   LMP 05/06/2017 Comment: neg. preg test per Melony Overly RN  BMI 43.51 kg/m   Constitutional: Well nourished. Alert and oriented, No acute distress. HEENT: [place] AT, moist mucus membranes. Trachea midline, no masses. Cardiovascular: No clubbing, cyanosis, or edema. Respiratory: Normal respiratory effort, no increased work of breathing. GI: Abdomen is soft, non tender, non distended, no abdominal masses. Liver and spleen not palpable.  No hernias appreciated.  Stool sample for occult testing is not indicated.   GU: No CVA tenderness.  No bladder fullness or masses.   Skin: No rashes, bruises or suspicious lesions. Lymph: No cervical or inguinal adenopathy. Neurologic: Grossly intact, no focal deficits, moving all 4 extremities. Psychiatric: Normal mood and affect.  Laboratory Data: Lab Results  Component Value Date   WBC 14.8 (H) 04/26/2017   HGB 11.7 (L) 04/26/2017   HCT 35.1 04/26/2017   MCV 93.8 04/26/2017   PLT 346 04/26/2017    Lab Results  Component Value Date   CREATININE 1.13 (H) 04/26/2017    Urinalysis Unremarkable.  See EPIC.   I have reviewed  the labs.   Pertinent Imaging: CLINICAL DATA:  Persistent flank pain following lithotripsy 2 weeks ago.  EXAM: ABDOMEN - 1 VIEW  COMPARISON:  Right thumb radiographs 04/25/2017. CT of the abdomen and pelvis 04/25/2017.  FINDINGS: Bowel gas pattern is normal. Lung bases are clear. Focal calcification in the right anatomic pelvis is compatible with the distal right UVJ stone. No other focal calcifications are present. The axial skeleton is unremarkable.  IMPRESSION: Persistent distal right UVJ stone.   Electronically Signed   By: Marin Roberts M.D.   On: 05/10/2017 14:23  I have independently reviewed the films.    Assessment & Plan:    1. Right ureteral stone  - KUB still demonstrates the stone  - stone sent for analysis - two tiny fragments passed  - s/p right ESWL  - Advised to contact our office or seek treatment in the ED if becomes febrile or pain/ vomiting are difficult control in order to arrange for emergent/urgent intervention  2. Right hydronephrosis  - obtain RUS to ensure the hydronephrosis has resolved once acute phase is over    Return in about 1 week (around 05/17/2017) for KUB and office visit.  These notes generated with voice recognition software. I apologize for typographical errors.  Michiel Cowboy, PA-C  [facility] [address] [place], [place] [phone]

## 2017-05-09 NOTE — Telephone Encounter (Signed)
Still having pain, no blood from hematuria she started her period, thinks she passed a small stone because it hurt really bad to pee.  Slight fever nothing worth taking her temp. She is drinking a lot .    Kelsey DusterMichelle

## 2017-05-10 ENCOUNTER — Ambulatory Visit
Admission: RE | Admit: 2017-05-10 | Discharge: 2017-05-10 | Disposition: A | Discharge status: Home / Self Care | Payer: Self-pay | Source: Ambulatory Visit | Attending: Urology | Admitting: Urology

## 2017-05-10 ENCOUNTER — Encounter: Payer: Self-pay | Admitting: Urology

## 2017-05-10 ENCOUNTER — Ambulatory Visit (INDEPENDENT_AMBULATORY_CARE_PROVIDER_SITE_OTHER): Payer: Self-pay | Admitting: Urology

## 2017-05-10 ENCOUNTER — Encounter: Payer: Self-pay | Admitting: Pharmacist

## 2017-05-10 VITALS — BP 139/86 | HR 108 | Temp 98.3°F | Ht 68.5 in | Wt 290.4 lb

## 2017-05-10 DIAGNOSIS — N201 Calculus of ureter: Secondary | ICD-10-CM | POA: Insufficient documentation

## 2017-05-10 DIAGNOSIS — N132 Hydronephrosis with renal and ureteral calculous obstruction: Secondary | ICD-10-CM

## 2017-05-10 DIAGNOSIS — R109 Unspecified abdominal pain: Secondary | ICD-10-CM | POA: Insufficient documentation

## 2017-05-10 DIAGNOSIS — R3129 Other microscopic hematuria: Secondary | ICD-10-CM

## 2017-05-10 DIAGNOSIS — Z87442 Personal history of urinary calculi: Secondary | ICD-10-CM

## 2017-05-10 LAB — URINALYSIS, COMPLETE
Bilirubin, UA: NEGATIVE
GLUCOSE, UA: NEGATIVE
Nitrite, UA: NEGATIVE
PROTEIN UA: NEGATIVE
SPEC GRAV UA: 1.02 (ref 1.005–1.030)
Urobilinogen, Ur: 0.2 mg/dL (ref 0.2–1.0)
pH, UA: 6 (ref 5.0–7.5)

## 2017-05-10 LAB — MICROSCOPIC EXAMINATION: RBC, UA: NONE SEEN /hpf (ref 0–?)

## 2017-05-11 ENCOUNTER — Other Ambulatory Visit: Payer: Self-pay | Admitting: Radiology

## 2017-05-11 ENCOUNTER — Telehealth: Payer: Self-pay | Admitting: Radiology

## 2017-05-11 DIAGNOSIS — N201 Calculus of ureter: Secondary | ICD-10-CM

## 2017-05-11 NOTE — Telephone Encounter (Signed)
Sounds good

## 2017-05-11 NOTE — Telephone Encounter (Signed)
Pt states a large stone was passed last night. Encouraged pt to keep follow up appt with KUB prior. Encouraged pt to bring stone to the appt. Pt voices understanding.

## 2017-05-14 ENCOUNTER — Telehealth: Payer: Self-pay

## 2017-05-14 NOTE — Telephone Encounter (Signed)
Patient called stating that she thinks she is passing another stone. She is having dysuria and urethritis , she is drinking a lot of water and utilizing Flomax and heating pad and wanted to know if she could try anything else to help pass the stone or help with her discomfort. Patient was told to try AZO OTC and see if this helps her discomfort and to bring a UA by the office tomorrow if her symptoms worsen.

## 2017-05-15 ENCOUNTER — Telehealth: Payer: Self-pay | Admitting: Pharmacist

## 2017-05-15 NOTE — Telephone Encounter (Signed)
05/15/17 Faxed [employer] application for renewal for Symbicort 160/4.5 mcg Inhale 2 puffs into the lungs two times a day.[name]

## 2017-05-16 NOTE — Progress Notes (Signed)
05/17/2017 2:46 PM   Kelsey Bell 21-Aug-1981 [id number]  Referring provider: Lynett Grimes, PA-C [address] [facility] [place], [place] [address]  Chief Complaint  Patient presents with  . Follow-up    1 week ureteral stone / hydronephrosis    HPI: Patient is a 36 year old Caucasian female who is status post right ESWL on 04/26/2017 for a right 5 mm UVJ stone who presents today for a two week follow up.    Background history Kelsey Bell is a 36 y.o. year old with a history of nephrolithiasis who presented overnight to the emergency room with severe right flank pain. CT scan showed a 5 mm right UVJ stone with moderate hydroureteronephrosis. In the emergency room, her pain was unable to be controlled and she was admitted for observation.  She denies any fevers or chills. She has experienced severe nausea with 1 episode of emesis. No dysuria or gross hematuria.  She reports that she has a personal history of stones. She is passed 2 stones in her lifetime, one approximately 2 weeks ago. Following this, her pain resolved but started again 2 days ago. It is since intensified.  She's never required surgical intervention for stones.  Her pain could not be controlled so she was admitted overnight and underwent ESWL the next day.  At her last visit, she was having frequency, urgency, dysuria and nocturia   She was seeing blood on her tissue paper when she wipes.  Her UA was unremarkable.   KUB taken 05/10/2017 demonstrates a right fragment in the region of the UVJ.    KUB taken 05/17/2017 noted interval passage of the right UVJ stone.  Today, she is experiencing frequency, dysuria, nocturia, straining to urinate, diarrhea and constipation. She has passed more fragments and brings them with her today.  She has not had fevers, chills, nausea or vomiting.  Her UA today is positive for 6-30 WBCs.    PMH: Past Medical History:  Diagnosis Date  . Breast pain present for several months   Bil LT >RT across of breasts  . Kidney stones     Surgical History: Past Surgical History:  Procedure Laterality Date  . EXTRACORPOREAL SHOCK WAVE LITHOTRIPSY Right 04/26/2017   Procedure: EXTRACORPOREAL SHOCK WAVE LITHOTRIPSY (ESWL);  Surgeon: Vanna Scotland, MD;  Location: [facility] ORS;  Service: Urology;  Laterality: Right;  . KNEE ARTHROSCOPY Right   . TONSILLECTOMY      Home Medications:  Allergies as of 05/17/2017      Reactions   Cefdinir Hives   Esomeprazole Anaphylaxis   Esomeprazole Magnesium Anaphylaxis   Hydroxychloroquine Rash, Shortness Of Breath   Nsaids Nausea Only, Other (See Comments)   Other: acid reflux   Sumatriptan Succinate Other (See Comments)   Cortisone Other (See Comments)   pain pain   Prednisone Other (See Comments)   Muscle spasms   Tapentadol Other (See Comments)   Body aches and pains, and itching. Swelling.   Amoxicillin Rash   Amoxicillin-pot Clavulanate Nausea And Vomiting, Other (See Comments)   GI symptoms and UTI   Doxycycline Rash   possible skin rash after stopping medication- rash resolved within minutes.    Sulfa Antibiotics Rash   Sulfasalazine Rash      Medication List       Accurate as of 05/17/17 11:59 PM. Always use your most recent med list.          acetaminophen 500 MG tablet Commonly known as:  TYLENOL Take  by mouth.   ALPRAZolam 0.25 MG tablet Commonly known as:  XANAX Take by mouth.   AZO CRANBERRY 250-30 MG Tabs Take by mouth.   cyclobenzaprine 5 MG tablet Commonly known as:  FLEXERIL Take by mouth.   FLUoxetine 40 MG capsule Commonly known as:  PROZAC Take 80 mg by mouth daily.   HYDROcodone-acetaminophen 5-325 MG tablet Commonly known as:  NORCO Take 1 tablet by mouth every 6 (six) hours as needed for moderate pain.   Lysine HCl 1000 MG Tabs Take by mouth.   montelukast 10 MG tablet Commonly known as:  SINGULAIR Take 10 mg by mouth at bedtime.   MULTI-VITAMINS Tabs Take 1 tablet by mouth  daily.   senna 8.6 MG tablet Commonly known as:  SENOKOT Take 1 tablet by mouth daily.   sucralfate 1 g tablet Commonly known as:  CARAFATE Take one tablet twice daily with meals and before bedtime PRN. Do not take with you vitamin D.   tamsulosin 0.4 MG Caps capsule Commonly known as:  FLOMAX Take 1 capsule (0.4 mg total) by mouth daily.   topiramate 50 MG tablet Commonly known as:  TOPAMAX Take 250 mg by mouth daily.   vitamin C 500 MG tablet Commonly known as:  ASCORBIC ACID Take 2 tablets by mouth daily.   Vitamin D3 5000 units Tabs Take by mouth.            Discharge Care Instructions        Start     Ordered   05/17/17 0000  US RENAL    Question Answer Comment  Reason for Exam (SYMPTOM  OR DIAGNOSIS REQUIRED) history of stone   Preferred imaging location?  [facility]      05/17/17 1347   05/17/17 0000  Urinalysis, Complete w Microscopic     05/17/17 1348   05/17/17 0000  tamsulosin (FLOMAX) 0.4 MG CAPS capsule  Daily    Question:  Supervising Provider  Answer:  Vanna ScotlandBRANDON, ASHLEY   05/17/17 1358      Allergies:  Allergies  Allergen Reactions  . Cefdinir Hives  . Esomeprazole Anaphylaxis  . Esomeprazole Magnesium Anaphylaxis  . Hydroxychloroquine Rash and Shortness Of Breath  . Nsaids Nausea Only and Other (See Comments)    Other: acid reflux  . Sumatriptan Succinate Other (See Comments)  . Cortisone Other (See Comments)    pain pain  . Prednisone Other (See Comments)    Muscle spasms  . Tapentadol Other (See Comments)    Body aches and pains, and itching. Swelling.  . Amoxicillin Rash  . Amoxicillin-Pot Clavulanate Nausea And Vomiting and Other (See Comments)    GI symptoms and UTI  . Doxycycline Rash    possible skin rash after stopping medication- rash resolved within minutes.   . Sulfa Antibiotics Rash  . Sulfasalazine Rash    Family History: Family History  Problem Relation Age of Onset  . Adopted: Yes  . Kidney cancer Neg Hx    . Bladder Cancer Neg Hx     Social History:  reports that she has never smoked. She has never used smokeless tobacco. She reports that she does not drink alcohol or use drugs.  ROS: UROLOGY Frequent Urination?: Yes Hard to postpone urination?: No Burning/pain with urination?: Yes Get up at night to urinate?: Yes Leakage of urine?: No Urine stream starts and stops?: No Trouble starting stream?: No Do you have to strain to urinate?: Yes Blood in urine?: No Urinary tract infection?: No Sexually transmitted  disease?: No Injury to kidneys or bladder?: No Painful intercourse?: No Weak stream?: No Currently pregnant?: No Vaginal bleeding?: No Last menstrual period?: n  Gastrointestinal Nausea?: No Vomiting?: No Indigestion/heartburn?: No Diarrhea?: Yes Constipation?: Yes  Constitutional Fever: No Night sweats?: No Weight loss?: No Fatigue?: No  Skin Skin rash/lesions?: No Itching?: No  Eyes Blurred vision?: No Double vision?: No  Ears/Nose/Throat Sore throat?: No Sinus problems?: No  Hematologic/Lymphatic Swollen glands?: No Easy bruising?: No  Cardiovascular Leg swelling?: Yes Chest pain?: No  Respiratory Cough?: No Shortness of breath?: No  Endocrine Excessive thirst?: No  Musculoskeletal Back pain?: Yes Joint pain?: No  Neurological Headaches?: Yes Dizziness?: No  Psychologic Depression?: Yes Anxiety?: Yes  Physical Exam: BP 113/76   Pulse 87   Ht 5' 8.5" (1.74 m)   Wt 294 lb 8 oz (133.6 kg)   LMP 05/06/2017 Comment: neg. preg test per Melony Overly RN  BMI 44.13 kg/m   Constitutional: Well nourished. Alert and oriented, No acute distress. HEENT: [place] AT, moist mucus membranes. Trachea midline, no masses. Cardiovascular: No clubbing, cyanosis, or edema. Respiratory: Normal respiratory effort, no increased work of breathing. Skin: No rashes, bruises or suspicious lesions. Lymph: No cervical or inguinal adenopathy. Neurologic: Grossly  intact, no focal deficits, moving all 4 extremities. Psychiatric: Normal mood and affect.  Laboratory Data: Lab Results  Component Value Date   WBC 14.8 (H) 04/26/2017   HGB 11.7 (L) 04/26/2017   HCT 35.1 04/26/2017   MCV 93.8 04/26/2017   PLT 346 04/26/2017    Lab Results  Component Value Date   CREATININE 1.13 (H) 04/26/2017      I have reviewed the labs.   Pertinent Imaging: CLINICAL DATA:  Hx of distal right UVJ stone. F/u films to see if stone has moved  EXAM: ABDOMEN - 1 VIEW  COMPARISON:  05/10/2017  FINDINGS: Small calculus previously noted in the right hemipelvis is no longer apparent, consistent with interval passage of the stone. No ureteral or renal calculi in her radiographically apparent. Visualized bowel gas pattern is nonobstructive.  IMPRESSION: Interval passage of right UVJ calculus.   Electronically Signed   By: Norva Pavlov M.D.   On: 05/17/2017 14:13 I have independently reviewed the films.    Assessment & Plan:    1. Right ureteral stone  - KUB demonstrates passage of the stone  - stone not sent for analysis due to financial concerns  - s/p right ESWL  - Advised to contact our office or seek treatment in the ED if becomes febrile or pain/ vomiting are difficult control in order to arrange for emergent/urgent intervention  2. Right hydronephrosis  - obtain RUS to ensure the hydronephrosis has resolved  3. Urinary frequency  - UA is suspicious for infection, will send for culture    Return for I will call patient with results.  These notes generated with voice recognition software. I apologize for typographical errors.  Michiel Cowboy, PA-C  [facility] [address] [place], [place] [address] [phone]

## 2017-05-17 ENCOUNTER — Ambulatory Visit
Admission: RE | Admit: 2017-05-17 | Discharge: 2017-05-17 | Disposition: A | Discharge status: Home / Self Care | Payer: Self-pay | Source: Ambulatory Visit | Attending: Urology | Admitting: Urology

## 2017-05-17 ENCOUNTER — Ambulatory Visit (INDEPENDENT_AMBULATORY_CARE_PROVIDER_SITE_OTHER): Payer: Self-pay | Admitting: Urology

## 2017-05-17 ENCOUNTER — Other Ambulatory Visit
Admission: RE | Admit: 2017-05-17 | Discharge: 2017-05-17 | Disposition: A | Discharge status: Home / Self Care | Payer: Self-pay | Source: Ambulatory Visit | Attending: Urology | Admitting: Urology

## 2017-05-17 ENCOUNTER — Encounter: Payer: Self-pay | Admitting: Urology

## 2017-05-17 VITALS — BP 113/76 | HR 87 | Ht 68.5 in | Wt 294.5 lb

## 2017-05-17 DIAGNOSIS — N201 Calculus of ureter: Secondary | ICD-10-CM | POA: Insufficient documentation

## 2017-05-17 DIAGNOSIS — R350 Frequency of micturition: Secondary | ICD-10-CM

## 2017-05-17 DIAGNOSIS — N132 Hydronephrosis with renal and ureteral calculous obstruction: Secondary | ICD-10-CM

## 2017-05-17 LAB — URINALYSIS, COMPLETE (UACMP) WITH MICROSCOPIC
BILIRUBIN URINE: NEGATIVE
Glucose, UA: NEGATIVE mg/dL
Hgb urine dipstick: NEGATIVE
Ketones, ur: NEGATIVE mg/dL
NITRITE: NEGATIVE
PH: 5.5 (ref 5.0–8.0)
Protein, ur: NEGATIVE mg/dL
Specific Gravity, Urine: 1.02 (ref 1.005–1.030)

## 2017-05-17 MED ORDER — TAMSULOSIN HCL 0.4 MG PO CAPS: 0.4000 mg | ORAL_CAPSULE | Freq: Every day | ORAL | 0 refills | Status: DC

## 2017-05-21 ENCOUNTER — Telehealth: Payer: Self-pay

## 2017-05-21 ENCOUNTER — Other Ambulatory Visit: Payer: Self-pay | Admitting: Urology

## 2017-05-21 DIAGNOSIS — N390 Urinary tract infection, site not specified: Secondary | ICD-10-CM

## 2017-05-21 DIAGNOSIS — N201 Calculus of ureter: Secondary | ICD-10-CM

## 2017-05-21 DIAGNOSIS — N200 Calculus of kidney: Secondary | ICD-10-CM

## 2017-05-21 NOTE — Telephone Encounter (Signed)
It is in the chart.  I also wanted her urine culture performed on the urine and I did hear Ramona call the Hospital lab and at this. I do not see the order for the results. Would you please investigate for me?

## 2017-05-21 NOTE — Telephone Encounter (Signed)
Pt called stating she was advised last week to take her urine to the main hospital for a u/a and cx. When looking in the chart I only see a u/a. Can you help?

## 2017-05-22 ENCOUNTER — Other Ambulatory Visit: Payer: Self-pay

## 2017-05-22 DIAGNOSIS — N200 Calculus of kidney: Secondary | ICD-10-CM

## 2017-05-22 NOTE — Telephone Encounter (Signed)
I need the patient to go to the hospital lab for an urine culture.

## 2017-05-22 NOTE — Telephone Encounter (Signed)
Pt came to [facility] for urine drop off. Pt elected to come to [facility] instead of going to hospital due to previous confusion.

## 2017-05-22 NOTE — Telephone Encounter (Signed)
Spoke with Kelsey Bell at lab in [place] who stated if a ucx was ordered then the cx would have been performed by them. [name] stated that [facility] only does u/a. [name] then stated that she does not have any ucx orders therefore she is positive it was not completed and urine was thrown out after 24hrs.

## 2017-05-25 ENCOUNTER — Telehealth: Payer: Self-pay

## 2017-05-25 LAB — CULTURE, URINE COMPREHENSIVE

## 2017-05-25 NOTE — Telephone Encounter (Signed)
-----   Message from Harle Battiest, PA-C sent at 05/25/2017  4:23 PM EDT ----- Please let Kelsey Bell know that her urine culture was negative.  She should have a RUS scheduled.

## 2017-05-25 NOTE — Telephone Encounter (Signed)
Spoke with pt in reference to -ucx and having RUS. Pt stated her RUS is scheduled for Tuesday.

## 2017-05-29 ENCOUNTER — Ambulatory Visit
Admission: RE | Admit: 2017-05-29 | Discharge: 2017-05-29 | Disposition: A | Discharge status: Home / Self Care | Payer: Self-pay | Source: Ambulatory Visit | Attending: Urology | Admitting: Urology

## 2017-05-29 DIAGNOSIS — N201 Calculus of ureter: Secondary | ICD-10-CM

## 2017-05-30 ENCOUNTER — Telehealth: Payer: Self-pay

## 2017-05-30 NOTE — Telephone Encounter (Signed)
I believe she saw an urologist in [place] for a 24 hour work up.  She can follow up with [place] or him.  If she would like to follow up with [place], I would like to have the results of the 24 hour work up.  That way, I can recommend a time frame for follow up.

## 2017-05-30 NOTE — Telephone Encounter (Signed)
Patient had 24 hour done at [facility], can view results in care everywhere date 05-10-17, 05-11-17

## 2017-05-30 NOTE — Telephone Encounter (Signed)
-----   Message from Harle Battiest, PA-C sent at 05/29/2017  5:14 PM EDT ----- Please let Glenna know that her RUS was normal.

## 2017-05-30 NOTE — Telephone Encounter (Signed)
We will need to see her on a yearly basis with KUB's.

## 2017-05-30 NOTE — Telephone Encounter (Signed)
Patient notified, she does not have a follow up scheduled does she need to schedule one?, please advise thanks

## 2017-06-01 ENCOUNTER — Other Ambulatory Visit: Payer: Self-pay | Admitting: Family Medicine

## 2017-06-01 DIAGNOSIS — N200 Calculus of kidney: Secondary | ICD-10-CM

## 2017-06-01 NOTE — Telephone Encounter (Signed)
Left message for patient to call back and schedule appointment for 1 year. The KUB order is in Epic.

## 2017-06-04 ENCOUNTER — Ambulatory Visit: Payer: Worker's Compensation | Admitting: Pharmacist

## 2017-06-04 ENCOUNTER — Encounter: Payer: Self-pay | Admitting: Pharmacist

## 2017-06-04 VITALS — Ht 68.5 in | Wt 285.0 lb

## 2017-06-04 DIAGNOSIS — Z79899 Other long term (current) drug therapy: Secondary | ICD-10-CM

## 2017-06-04 NOTE — Progress Notes (Signed)
Medication Management Clinic Visit Note  Patient: Kelsey Bell MRN: [id number] Date of Birth: 1980/11/02 PCP: Lynett Grimes, PA-C   [name] 36 y.o. female presents for a her annual medication therapy management visit with the pharmacist visit today.  LMP 05/06/2017 Comment: neg. preg test per Melony Overly RN  Patient Information   Past Medical History:  Diagnosis Date  . Allergy   . Anxiety   . Arthritis   . Asthma   . Breast pain present for several months   Bil LT >RT across of breasts  . Depression   . Kidney stones   . Migraine   . Neuromuscular disorder (HCC)    Complex regional pain syndrome and Fibromyalgia      Past Surgical History:  Procedure Laterality Date  . EXTRACORPOREAL SHOCK WAVE LITHOTRIPSY Right 04/26/2017   Procedure: EXTRACORPOREAL SHOCK WAVE LITHOTRIPSY (ESWL);  Surgeon: Vanna Scotland, MD;  Location: [facility] ORS;  Service: Urology;  Laterality: Right;  . KNEE ARTHROSCOPY Right   . TONSILLECTOMY       Family History  Problem Relation Age of Onset  . Adopted: Yes  . Alcohol abuse Mother   . Depression Mother   . Drug abuse Mother   . Mental illness Mother   . Alcohol abuse Father   . Arthritis Father   . Asthma Father   . COPD Father   . Depression Father   . Diabetes Father   . Drug abuse Father   . Early death Father   . Heart disease Father   . Hyperlipidemia Father   . Hypertension Father   . Kidney disease Father   . Stroke Father   . Diabetes Brother   . Depression Brother   . Kidney cancer Neg Hx   . Bladder Cancer Neg Hx     New Diagnoses (since last visit):   Family Support: Good     Current Exercise Habits: Home exercise routine  Exercise limited by: Other - see comments (pain)    History  Alcohol Use No      History  Smoking Status  . Never Smoker  Smokeless Tobacco  . Never Used      Health Maintenance  Topic Date Due  . HIV Screening  07/08/1995  . TETANUS/TDAP  07/08/1999  . PAP SMEAR   07/07/2001  . INFLUENZA VACCINE  04/04/2017     Prior to Admission medications   Medication Sig Start Date End Date Taking? Authorizing Provider  acetaminophen (TYLENOL) 500 MG tablet Take 1,000 mg by mouth every evening.    Yes [provider]  albuterol (PROVENTIL HFA;VENTOLIN HFA) 108 (90 Base) MCG/ACT inhaler Inhale 1 puff into the lungs as needed for wheezing or shortness of breath.   Yes Mertie Moores, MD  ALPRAZolam Prudy Feeler) 0.25 MG tablet Take 0.25 mg by mouth as needed for anxiety.   Yes [provider]  budesonide-formoterol (SYMBICORT) 160-4.5 MCG/ACT inhaler Inhale 2 puffs into the lungs 2 (two) times daily. Using as needed   Yes Mertie Moores, MD  calcium carbonate (OS-CAL) 600 MG TABS tablet Take 600 mg by mouth daily.   Yes [provider]  Cholecalciferol (VITAMIN D3) 5000 units TABS Take 10,000 Units by mouth daily.    Yes [provider]  cyclobenzaprine (FLEXERIL) 5 MG tablet Take by mouth as needed.  08/10/16  Yes [provider]  Diclofenac Sodium 1.5 % SOLN Place 1 mL onto the skin 3 (three) times daily as needed.  Enrigue Catena, MD  FLUoxetine (PROZAC) 40 MG capsule Take 80 mg by mouth daily.   Yes [provider]  ipratropium-albuterol (DUONEB) 0.5-2.5 (3) MG/3ML SOLN Take 3 mLs by nebulization as needed.   Yes Fleming, Herbon E, MD  Lidocaine-Menthol 4-4 % PTCH Apply 1 patch topically every 12 (twelve) hours.   Yes Sheran Luz, MD  montelukast (SINGULAIR) 10 MG tablet Take 10 mg by mouth at bedtime.   Yes [provider]  Multiple Vitamin (MULTI-VITAMINS) TABS Take 1 tablet by mouth daily.   Yes [provider]  sucralfate (CARAFATE) 1 g tablet Take one tablet twice daily with meals and before bedtime PRN. Do not take with you vitamin D. 12/13/16  Yes [provider]  tamsulosin (FLOMAX) 0.4 MG CAPS capsule Take 1 capsule (0.4 mg total) by mouth daily. 05/17/17  Yes McGowan,  Carollee Herter A, PA-C  topiramate (TOPAMAX) 50 MG tablet Take 250 mg by mouth at bedtime.    Yes [provider]  HYDROcodone-acetaminophen (NORCO) 5-325 MG tablet Take 1 tablet by mouth every 6 (six) hours as needed for moderate pain. 04/26/17   Adrian Saran, MD  promethazine (PHENERGAN) 25 MG tablet Take 1 tablet (25 mg total) by mouth every 6 (six) hours as needed for nausea or vomiting. 06/06/17   [name], MD    Health Maintenance/Date Completed  Last ED visit: 04/25/17 Last Visit to PCP: 01-09-17 Next Visit to PCP: as needed  Specialist Visit: [facility] Exam: 2016 Eye Exam: 2018 Prostate Exam: n/a Pelvic/PAP Exam: TBD Mammogram: 2017 DEXA: n/a Colonoscopy: n/a Flu Vaccine: Got sick with last flu shot Pneumonia Vaccine: no   Assessment and Plan: Compliance: Takes medications daily as prescribed. Patient did not bring her medication bottles but was well informed. Pain Management: Using terocin patches, cyclobenzaprine, topiramate and diclofenac drops. This is being managed through [facility]. Patient states she is not using the Norco. Kidney Stone/Urology: last episode 6-05/2017. Most recent x-ray clear of stones. Managed by Dr. Bartholome Bill at [facility]). May start on potassium citrate for stones. Currently on tamsulosin. Depression: Currently on Prozac. Asthma: Flare about 2 weeks ago, had to use Symbicort and albuterol. Can go days without any inhalers. Using montelukast daily. Uses fluticasone OTC, encouraged patient to ask Dr. Meredeth Ide to send Rx to [facility].  Jolonda Gomm K. Joelene Millin, PharmD [facility]-Pharmacy Operations Coordinator [phone]

## 2017-06-06 ENCOUNTER — Ambulatory Visit
Admission: EM | Admit: 2017-06-06 | Discharge: 2017-06-06 | Disposition: A | Discharge status: Home / Self Care | Payer: Self-pay | Attending: Family Medicine | Admitting: Family Medicine

## 2017-06-06 DIAGNOSIS — G43109 Migraine with aura, not intractable, without status migrainosus: Secondary | ICD-10-CM

## 2017-06-06 MED ORDER — ONDANSETRON 8 MG PO TBDP
8.0000 mg | ORAL_TABLET | Freq: Once | ORAL | Status: AC
  Administered 2017-06-06: 8 mg via ORAL

## 2017-06-06 MED ORDER — KETOROLAC TROMETHAMINE 60 MG/2ML IM SOLN
60.0000 mg | Freq: Once | INTRAMUSCULAR | Status: AC
  Administered 2017-06-06: 60 mg via INTRAMUSCULAR

## 2017-06-06 MED ORDER — PROMETHAZINE HCL 25 MG PO TABS: 25.0000 mg | ORAL_TABLET | Freq: Four times a day (QID) | ORAL | 0 refills | Status: DC | PRN

## 2017-06-06 NOTE — ED Triage Notes (Signed)
Patient complains of migraine that started last Friday. Patient states that she has a history of migraines and takes Topamax daily. Sensitive to light and sound.

## 2017-06-06 NOTE — ED Provider Notes (Signed)
[facility]    CSN: [id number] Arrival date & time: 06/06/17  1553     History   Chief Complaint Chief Complaint  Patient presents with  . Migraine    HPI Kelsey Bell is a 36 y.o. female.   The history is provided by the patient.  Migraine  This is a new problem. The current episode started more than 2 days ago. The problem occurs constantly. Associated symptoms include headaches. Pertinent negatives include no chest pain, no abdominal pain and no shortness of breath.    Past Medical History:  Diagnosis Date  . Allergy   . Anxiety   . Arthritis   . Asthma   . Breast pain present for several months   Bil LT >RT across of breasts  . Depression   . Kidney stones   . Migraine   . Neuromuscular disorder (HCC)    Complex regional pain syndrome and Fibromyalgia    Patient Active Problem List   Diagnosis Date Noted  . Kidney stone 04/25/2017  . Ureteral stone     Past Surgical History:  Procedure Laterality Date  . EXTRACORPOREAL SHOCK WAVE LITHOTRIPSY Right 04/26/2017   Procedure: EXTRACORPOREAL SHOCK WAVE LITHOTRIPSY (ESWL);  Surgeon: Vanna Scotland, MD;  Location: [facility] ORS;  Service: Urology;  Laterality: Right;  . KNEE ARTHROSCOPY Right   . TONSILLECTOMY      OB History    No data available       Home Medications    Prior to Admission medications   Medication Sig Start Date End Date Taking? Authorizing Provider  acetaminophen (TYLENOL) 500 MG tablet Take 1,000 mg by mouth every evening.    Yes [provider]  albuterol (PROVENTIL HFA;VENTOLIN HFA) 108 (90 Base) MCG/ACT inhaler Inhale 1 puff into the lungs as needed for wheezing or shortness of breath.   Yes Mertie Moores, MD  ALPRAZolam Prudy Feeler) 0.25 MG tablet Take 0.25 mg by mouth as needed for anxiety.   Yes [provider]  budesonide-formoterol (SYMBICORT) 160-4.5 MCG/ACT inhaler Inhale 2 puffs into the lungs 2 (two) times daily. Using as needed   Yes Mertie Moores, MD  calcium carbonate (OS-CAL) 600 MG TABS tablet Take 600 mg by mouth daily.   Yes [provider]  Cholecalciferol (VITAMIN D3) 5000 units TABS Take 10,000 Units by mouth daily.    Yes [provider]  cyclobenzaprine (FLEXERIL) 5 MG tablet Take by mouth as needed.  08/10/16  Yes [provider]  Diclofenac Sodium 1.5 % SOLN Place 1 mL onto the skin 3 (three) times daily as needed.   Enrigue Catena, MD  FLUoxetine (PROZAC) 40 MG capsule Take 80 mg by mouth daily.   Yes [provider]  HYDROcodone-acetaminophen (NORCO) 5-325 MG tablet Take 1 tablet by mouth every 6 (six) hours as needed for moderate pain. 04/26/17  Yes Mody, Sital, MD  ipratropium-albuterol (DUONEB) 0.5-2.5 (3) MG/3ML SOLN Take 3 mLs by nebulization as needed.   Yes Fleming, Herbon E, MD  Lidocaine-Menthol 4-4 % PTCH Apply 1 patch topically every 12 (twelve) hours.   Yes Sheran Luz, MD  montelukast (SINGULAIR) 10 MG tablet Take 10 mg by mouth at bedtime.   Yes [provider]  Multiple Vitamin (MULTI-VITAMINS) TABS Take 1 tablet by mouth daily.   Yes [provider]  sucralfate (CARAFATE) 1 g tablet Take one tablet twice daily with meals and before bedtime PRN. Do not take with you vitamin D. 12/13/16  Yes  [provider]  tamsulosin (FLOMAX) 0.4 MG CAPS capsule Take 1 capsule (0.4 mg total) by mouth daily. 05/17/17  Yes McGowan, Carollee Herter A, PA-C  topiramate (TOPAMAX) 50 MG tablet Take 250 mg by mouth at bedtime.    Yes [provider]  promethazine (PHENERGAN) 25 MG tablet Take 1 tablet (25 mg total) by mouth every 6 (six) hours as needed for nausea or vomiting. 06/06/17   [name], MD    Family History Family History  Problem Relation Age of Onset  . Adopted: Yes  . Alcohol abuse Mother   . Depression Mother   . Drug abuse Mother   . Mental illness Mother   . Alcohol abuse Father   . Arthritis Father   . Asthma Father   . COPD  Father   . Depression Father   . Diabetes Father   . Drug abuse Father   . Early death Father   . Heart disease Father   . Hyperlipidemia Father   . Hypertension Father   . Kidney disease Father   . Stroke Father   . Diabetes Brother   . Depression Brother   . Kidney cancer Neg Hx   . Bladder Cancer Neg Hx     Social History Social History  Substance Use Topics  . Smoking status: Never Smoker  . Smokeless tobacco: Never Used  . Alcohol use No     Allergies   Cefdinir; Esomeprazole; Esomeprazole magnesium; Gabapentin; Hydroxychloroquine; Nsaids; Sumatriptan succinate; Cortisone; Duloxetine; Prednisone; Tapentadol; Amoxicillin; Amoxicillin-pot clavulanate; Reglan [metoclopramide]; Sulfa antibiotics; and Sulfasalazine   Review of Systems Review of Systems  Respiratory: Negative for shortness of breath.   Cardiovascular: Negative for chest pain.  Gastrointestinal: Negative for abdominal pain.  Neurological: Positive for headaches.     Physical Exam Triage Vital Signs ED Triage Vitals  Enc Vitals Group     BP 06/06/17 1632 124/71     Pulse Rate 06/06/17 1632 94     Resp 06/06/17 1632 17     Temp 06/06/17 1632 98.3 F (36.8 C)     Temp Source 06/06/17 1632 Oral     SpO2 06/06/17 1632 100 %     Weight 06/06/17 1630 285 lb (129.3 kg)     Height 06/06/17 1630 5' 8.5" (1.74 m)     Head Circumference --      Peak Flow --      Pain Score 06/06/17 1630 8     Pain Loc --      Pain Edu? --      Excl. in GC? --    No data found.   Updated Vital Signs BP 124/71 (BP Location: Left Arm)   Pulse 94   Temp 98.3 F (36.8 C) (Oral)   Resp 17   Ht 5' 8.5" (1.74 m)   Wt 285 lb (129.3 kg)   LMP 05/30/2017   SpO2 100%   BMI 42.70 kg/m   Visual Acuity Right Eye Distance:   Left Eye Distance:   Bilateral Distance:    Right Eye Near:   Left Eye Near:    Bilateral Near:     Physical Exam  Constitutional: She is oriented to person, place, and time. She appears  well-developed and well-nourished. No distress.  HENT:  Head: Normocephalic and atraumatic.  Right Ear: Tympanic membrane and ear canal normal.  Left Ear: Tympanic membrane and ear canal normal.  Mouth/Throat: Mucous membranes are normal.  Eyes: Pupils are equal, round, and reactive to light. Conjunctivae  and EOM are normal. Right eye exhibits no discharge. Left eye exhibits no discharge. No scleral icterus.  Neck: Normal range of motion. Neck supple. No tracheal deviation present.  Cardiovascular: Normal rate, regular rhythm, normal heart sounds and intact distal pulses.   No murmur heard. Pulmonary/Chest: Effort normal and breath sounds normal. No stridor. No respiratory distress. She has no wheezes. She has no rales. She exhibits no tenderness.  Musculoskeletal: She exhibits no edema or tenderness.  Lymphadenopathy:    She has no cervical adenopathy.  Neurological: She is alert and oriented to person, place, and time. She has normal reflexes. She displays normal reflexes. No cranial nerve deficit or sensory deficit. She exhibits normal muscle tone. Coordination normal.  Skin: Skin is warm and dry. No rash noted. She is not diaphoretic. No erythema. No pallor.  Vitals reviewed.    UC Treatments / Results  Labs (all labs ordered are listed, but only abnormal results are displayed) Labs Reviewed - No data to display  EKG  EKG Interpretation None       Radiology No results found.  Procedures Procedures (including critical care time)  Medications Ordered in UC Medications  ketorolac (TORADOL) injection 60 mg (60 mg Intramuscular Given 06/06/17 1710)  ondansetron (ZOFRAN-ODT) disintegrating tablet 8 mg (8 mg Oral Given 06/06/17 1710)     Initial Impression / Assessment and Plan / UC Course  I have reviewed the triage vital signs and the nursing notes.  Pertinent labs & imaging results that were available during my care of the patient were reviewed by me and considered in my  medical decision making (see chart for details).       Final Clinical Impressions(s) / UC Diagnoses   Final diagnoses:  Migraine with aura and without status migrainosus, not intractable    New Prescriptions New Prescriptions   PROMETHAZINE (PHENERGAN) 25 MG TABLET    Take 1 tablet (25 mg total) by mouth every 6 (six) hours as needed for nausea or vomiting.   1. diagnosis reviewed with patient; patient given toradol  im x1 and zofran  po x 1 with improvement of symptoms 2. rx as per orders above; reviewed possible side effects, interactions, risks and benefits  3. Recommend supportive treatment with otc analgesics prn 4. Follow-up prn if symptoms worsen or don't improve  Controlled Substance Prescriptions [place] Controlled Substance Registry consulted? Not Applicable   [name], MD 06/06/17 [phone]

## 2017-06-07 ENCOUNTER — Telehealth: Payer: Self-pay | Admitting: Pharmacist

## 2017-06-07 NOTE — Telephone Encounter (Signed)
06/07/17 Called [facility] for refills-Advair 250/50 & Ventolin, they will ship on 06/10/17-allow 7-10 business days to receive from that date. [id number].Forde Radon

## 2017-06-14 ENCOUNTER — Encounter: Payer: Self-pay | Admitting: Obstetrics and Gynecology

## 2017-06-14 ENCOUNTER — Other Ambulatory Visit (INDEPENDENT_AMBULATORY_CARE_PROVIDER_SITE_OTHER): Payer: Self-pay

## 2017-06-14 ENCOUNTER — Ambulatory Visit (INDEPENDENT_AMBULATORY_CARE_PROVIDER_SITE_OTHER): Payer: Self-pay | Admitting: Obstetrics and Gynecology

## 2017-06-14 VITALS — BP 140/94 | HR 78 | Ht 68.5 in | Wt 294.0 lb

## 2017-06-14 DIAGNOSIS — N83202 Unspecified ovarian cyst, left side: Secondary | ICD-10-CM

## 2017-06-14 DIAGNOSIS — Z124 Encounter for screening for malignant neoplasm of cervix: Secondary | ICD-10-CM

## 2017-06-14 DIAGNOSIS — Z1151 Encounter for screening for human papillomavirus (HPV): Secondary | ICD-10-CM

## 2017-06-14 DIAGNOSIS — Z01419 Encounter for gynecological examination (general) (routine) without abnormal findings: Secondary | ICD-10-CM

## 2017-06-14 NOTE — Progress Notes (Signed)
PCP:  Lynett Grimes, PA-C   Chief Complaint  Patient presents with  . Gynecologic Exam    OVARIAN CYST LEFT OVARY FOUND IN ER; BREAST HURT ALL THE TIME     HPI:      Ms. Kelsey Bell is a 36 y.o. No obstetric history on file. who LMP was Patient's last menstrual period was 05/30/2017., presents today for her NP annual examination.  Her menses are regular every 28-30 days, lasting 4 days.  Dysmenorrhea mild, occurring first 1-2 days of flow. She does not have intermenstrual bleeding.  Sex activity: not sexually active.  Last Pap: not recently. Results were: no abnormalities  Per pt report. 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 Results were: normal. Still has bilat breast tenderness, no caffeine use.  There is no FH of breast cancer. There is no FH of ovarian cancer. The patient does do self-breast exams.  Tobacco use: The patient denies current or previous tobacco use. Alcohol use: none No drug use.  Exercise: not active  She does get adequate calcium and Vitamin D in her diet.  She has a hx of kidney stones and was noted to have a 3.7 cm LTO cyst on CT scan 8/18 in ED. She is due for repeat u/s. She still has some LLQ discomfort. Hx of ovar cysts in the past,    Past Medical History:  Diagnosis Date  . Allergy   . Anxiety   . Arthritis   . Asthma   . Breast pain present for several months   Bil LT >RT across of breasts  . Depression   . Kidney stones   . Migraine   . Neuromuscular disorder (HCC)    Complex regional pain syndrome and Fibromyalgia    Past Surgical History:  Procedure Laterality Date  . EXTRACORPOREAL SHOCK WAVE LITHOTRIPSY Right 04/26/2017   Procedure: EXTRACORPOREAL SHOCK WAVE LITHOTRIPSY (ESWL);  Surgeon: Vanna Scotland, MD;  Location: [facility] ORS;  Service: Urology;  Laterality: Right;  . KNEE ARTHROSCOPY Right   . TONSILLECTOMY      Family History  Problem Relation Age of  Onset  . Adopted: Yes  . Alcohol abuse Mother   . Depression Mother   . Drug abuse Mother   . Mental illness Mother   . Alcohol abuse Father   . Arthritis Father   . Asthma Father   . COPD Father   . Depression Father   . Diabetes Father   . Drug abuse Father   . Early death Father   . Heart disease Father   . Hyperlipidemia Father   . Hypertension Father   . Kidney disease Father   . Stroke Father   . Diabetes Brother   . Depression Brother   . Kidney cancer Neg Hx   . Bladder Cancer Neg Hx     Social History   Social History  . Marital status: Single    Spouse name: N/A  . Number of children: N/A  . Years of education: N/A   Occupational History  .  [employer]   Social History Main Topics  . Smoking status: Never Smoker  . Smokeless tobacco: Never Used  . Alcohol use No  . Drug use: No  . Sexual activity: No   Other Topics Concern  . Not on file   Social History Narrative  . No narrative on file    Current Meds  Medication Sig  . acetaminophen (TYLENOL) 500 MG tablet Take 1,000 mg by mouth every evening.   Marland Kitchen albuterol (PROVENTIL HFA;VENTOLIN HFA) 108 (90 Base) MCG/ACT inhaler Inhale 1 puff into the lungs as needed for wheezing or shortness of breath.  . ALPRAZolam (XANAX) 0.25 MG tablet Take 0.25 mg by mouth as needed for anxiety.  . budesonide-formoterol (SYMBICORT) 160-4.5 MCG/ACT inhaler Inhale 2 puffs into the lungs 2 (two) times daily. Using as needed  . calcium carbonate (OS-CAL) 600 MG TABS tablet Take 600 mg by mouth daily.  . Cholecalciferol (VITAMIN D3) 5000 units TABS Take 10,000 Units by mouth daily.   . cyclobenzaprine (FLEXERIL) 5 MG tablet Take by mouth as needed.   Marland Kitchen FLUoxetine (PROZAC) 40 MG capsule Take 80 mg by mouth daily.  . fluticasone (FLONASE) 50 MCG/ACT nasal spray Place 2 sprays into the nose 2 (two) times daily.  Marland Kitchen HYDROcodone-acetaminophen (NORCO) 5-325 MG tablet Take 1 tablet by mouth every 6 (six) hours as needed for moderate  pain.  Marland Kitchen ipratropium-albuterol (DUONEB) 0.5-2.5 (3) MG/3ML SOLN Take 3 mLs by nebulization as needed.  . Lidocaine-Menthol 4-4 % PTCH Apply 1 patch topically every 12 (twelve) hours.  . montelukast (SINGULAIR) 10 MG tablet Take 10 mg by mouth at bedtime.  . Multiple Vitamin (MULTI-VITAMINS) TABS Take 1 tablet by mouth daily.  . promethazine (PHENERGAN) 25 MG tablet Take 1 tablet (25 mg total) by mouth every 6 (six) hours as needed for nausea or vomiting.  . tamsulosin (FLOMAX) 0.4 MG CAPS capsule Take 1 capsule (0.4 mg total) by mouth daily.  Marland Kitchen topiramate (TOPAMAX) 50 MG tablet Take 250 mg by mouth at bedtime.      ROS:  Review of Systems  Constitutional: Negative for fatigue, fever and unexpected weight change.  Respiratory: Negative for cough, shortness of breath and wheezing.   Cardiovascular: Negative for chest pain, palpitations and leg swelling.  Gastrointestinal: Negative for blood in stool, constipation, diarrhea, nausea and vomiting.  Endocrine: Negative for cold intolerance, heat intolerance and polyuria.  Genitourinary: Negative for dyspareunia, dysuria, flank pain, frequency, genital sores, hematuria, menstrual problem, pelvic pain, urgency, vaginal bleeding, vaginal discharge and vaginal pain.  Musculoskeletal: Positive for arthralgias and joint swelling. Negative for back pain and myalgias.  Skin: Negative for rash.  Neurological: Positive for headaches. Negative for dizziness, syncope, light-headedness and numbness.  Hematological: Negative for adenopathy.  Psychiatric/Behavioral: Positive for dysphoric mood. Negative for agitation, confusion, sleep disturbance and suicidal ideas. The patient is nervous/anxious.      Objective: BP (!) 140/94   Pulse 78   Ht 5' 8.5" (1.74 m)   Wt 294 lb (133.4 kg)   LMP 05/30/2017   BMI 44.05 kg/m    Physical Exam  Constitutional: She is oriented to person, place, and time. She appears well-developed and well-nourished.    Genitourinary: Vagina normal and uterus normal. There is no rash or tenderness on the right labia. There is no rash or tenderness on the left labia. No erythema or tenderness in the vagina. No vaginal discharge found. Right adnexum does not display mass and does not display tenderness.  Left adnexum displays tenderness. Left adnexum does not display mass. Cervix does not exhibit motion tenderness or polyp. Uterus is not enlarged or tender.  Neck: Normal range of motion. No thyromegaly present.  Cardiovascular: Normal rate, regular rhythm and normal heart sounds.   No murmur heard. Pulmonary/Chest: Effort normal and breath sounds normal. Right breast exhibits no mass, no nipple discharge, no skin  change and no tenderness. Left breast exhibits no mass, no nipple discharge, no skin change and no tenderness.  Abdominal: Soft. There is no tenderness. There is no guarding.  Musculoskeletal: Normal range of motion.  Neurological: She is alert and oriented to person, place, and time. No cranial nerve deficit.  Psychiatric: She has a normal mood and affect. Her behavior is normal.  Vitals reviewed.   Results: GYN U/S--> EM=9.44 MM; NO FF IN CDS; RTO FOLLICLE, LTO FOLLICLE WITH 3.4 X 2.5 SIMPLE CYST   Assessment/Plan: Encounter for annual routine gynecological examination  Cervical cancer screening - Plan: IGP, Aptima HPV  Screening for HPV (human papillomavirus) - Plan: IGP, Aptima HPV  Cyst of left ovary - GYN u/s showed bilat follicles, simple LT ovar cyst. Reassurance. F/u prn. - Plan: US PELVIS TRANSVANGINAL NON-OB (TV ONLY)           GYN counsel breast self exam, adequate intake of calcium and vitamin D, diet and exercise     F/U  Return in about 1 year (around 06/14/2018).  Alicia B. Copland, PA-C 06/15/2017 2:12 PM

## 2017-06-17 ENCOUNTER — Ambulatory Visit
Admission: EM | Admit: 2017-06-17 | Discharge: 2017-06-17 | Disposition: A | Discharge status: Home / Self Care | Payer: Self-pay | Attending: Emergency Medicine | Admitting: Emergency Medicine

## 2017-06-17 DIAGNOSIS — R0981 Nasal congestion: Secondary | ICD-10-CM

## 2017-06-17 DIAGNOSIS — R5383 Other fatigue: Secondary | ICD-10-CM

## 2017-06-17 DIAGNOSIS — J029 Acute pharyngitis, unspecified: Secondary | ICD-10-CM

## 2017-06-17 DIAGNOSIS — J111 Influenza due to unidentified influenza virus with other respiratory manifestations: Secondary | ICD-10-CM

## 2017-06-17 DIAGNOSIS — R69 Illness, unspecified: Secondary | ICD-10-CM

## 2017-06-17 DIAGNOSIS — R05 Cough: Secondary | ICD-10-CM

## 2017-06-17 LAB — IGP, APTIMA HPV
HPV APTIMA: NEGATIVE
PAP SMEAR COMMENT: 0

## 2017-06-17 MED ORDER — OSELTAMIVIR PHOSPHATE 75 MG PO CAPS: 75.0000 mg | ORAL_CAPSULE | Freq: Two times a day (BID) | ORAL | 0 refills | Status: DC

## 2017-06-17 MED ORDER — BENZONATATE 200 MG PO CAPS: ORAL_CAPSULE | ORAL | 0 refills | Status: DC

## 2017-06-17 NOTE — ED Provider Notes (Signed)
[facility]    CSN: [id number] Arrival date & time: 06/17/17  1548     History   Chief Complaint Chief Complaint  Patient presents with  . Cough    HPI Kelsey Bell is a 36 y.o. female.   HPI  This a 36 year old female who presents with the very sudden onset of  symptoms on Friday 2 days prior to this visit. She has a cough that is largely nonproductive body aches chills she has not taken  temperature. She has not received her flu injection this year. She says she feels absolutely terrible.        Past Medical History:  Diagnosis Date  . Allergy   . Anxiety   . Arthritis   . Asthma   . Breast pain present for several months   Bil LT >RT across of breasts  . Depression   . Kidney stones   . Migraine   . Neuromuscular disorder (HCC)    Complex regional pain syndrome and Fibromyalgia    Patient Active Problem List   Diagnosis Date Noted  . Kidney stone 04/25/2017  . Ureteral stone     Past Surgical History:  Procedure Laterality Date  . EXTRACORPOREAL SHOCK WAVE LITHOTRIPSY Right 04/26/2017   Procedure: EXTRACORPOREAL SHOCK WAVE LITHOTRIPSY (ESWL);  Surgeon: Vanna Scotland, MD;  Location: [facility] ORS;  Service: Urology;  Laterality: Right;  . KNEE ARTHROSCOPY Right   . TONSILLECTOMY      OB History    Gravida Para Term Preterm AB Living   0 0 0 0 0 0   SAB TAB Ectopic Multiple Live Births   0 0 0 0 0       Home Medications    Prior to Admission medications   Medication Sig Start Date End Date Taking? Authorizing Provider  acetaminophen (TYLENOL) 500 MG tablet Take 1,000 mg by mouth every evening.    Yes [provider]  albuterol (PROVENTIL HFA;VENTOLIN HFA) 108 (90 Base) MCG/ACT inhaler Inhale 1 puff into the lungs as needed for wheezing or shortness of breath.   Yes Mertie Moores, MD  ALPRAZolam Prudy Feeler) 0.25 MG tablet Take 0.25 mg by mouth as needed for anxiety.   Yes [provider]  budesonide-formoterol  (SYMBICORT) 160-4.5 MCG/ACT inhaler Inhale 2 puffs into the lungs 2 (two) times daily. Using as needed   Yes Mertie Moores, MD  calcium carbonate (OS-CAL) 600 MG TABS tablet Take 600 mg by mouth daily.   Yes [provider]  Cholecalciferol (VITAMIN D3) 5000 units TABS Take 10,000 Units by mouth daily.    Yes [provider]  cyclobenzaprine (FLEXERIL) 5 MG tablet Take by mouth as needed.  08/10/16  Yes [provider]  FLUoxetine (PROZAC) 40 MG capsule Take 80 mg by mouth daily.   Yes [provider]  fluticasone (FLONASE) 50 MCG/ACT nasal spray Place 2 sprays into the nose 2 (two) times daily. 06/07/17  Yes [provider]  HYDROcodone-acetaminophen (NORCO) 5-325 MG tablet Take 1 tablet by mouth every 6 (six) hours as needed for moderate pain. 04/26/17  Yes Mody, Sital, MD  ipratropium-albuterol (DUONEB) 0.5-2.5 (3) MG/3ML SOLN Take 3 mLs by nebulization as needed.   Yes Fleming, Herbon E, MD  Lidocaine-Menthol 4-4 % PTCH Apply 1 patch topically every 12 (twelve) hours.   Yes Sheran Luz, MD  montelukast (SINGULAIR) 10 MG tablet Take 10 mg by mouth at bedtime.   Yes [provider]  Multiple Vitamin (MULTI-VITAMINS) TABS  Take 1 tablet by mouth daily.   Yes [provider]  promethazine (PHENERGAN) 25 MG tablet Take 1 tablet (25 mg total) by mouth every 6 (six) hours as needed for nausea or vomiting. 06/06/17  Yes [name], MD  tamsulosin (FLOMAX) 0.4 MG CAPS capsule Take 1 capsule (0.4 mg total) by mouth daily. 05/17/17  Yes McGowan, Carollee Herter A, PA-C  topiramate (TOPAMAX) 50 MG tablet Take 250 mg by mouth at bedtime.    Yes [provider]  benzonatate (TESSALON) 200 MG capsule Take one cap TID PRN cough 06/17/17   Lutricia Feil, PA-C  oseltamivir (TAMIFLU) 75 MG capsule Take 1 capsule (75 mg total) by mouth every 12 (twelve) hours. 06/17/17   Lutricia Feil, PA-C    Family History Family History  Problem  Relation Age of Onset  . Adopted: Yes  . Alcohol abuse Mother   . Depression Mother   . Drug abuse Mother   . Mental illness Mother   . Alcohol abuse Father   . Arthritis Father   . Asthma Father   . COPD Father   . Depression Father   . Diabetes Father   . Drug abuse Father   . Early death Father   . Heart disease Father   . Hyperlipidemia Father   . Hypertension Father   . Kidney disease Father   . Stroke Father   . Diabetes Brother   . Depression Brother   . Kidney cancer Neg Hx   . Bladder Cancer Neg Hx     Social History Social History  Substance Use Topics  . Smoking status: Never Smoker  . Smokeless tobacco: Never Used  . Alcohol use No     Allergies   Cefdinir; Esomeprazole; Esomeprazole magnesium; Gabapentin; Hydroxychloroquine; Tapentadol; Nsaids; Sumatriptan succinate; Cortisone; Duloxetine; Prednisone; Amoxicillin; Amoxicillin-pot clavulanate; Reglan [metoclopramide]; Sulfa antibiotics; and Sulfasalazine   Review of Systems Review of Systems  Constitutional: Positive for activity change, appetite change, chills and fatigue. Negative for fever.  HENT: Positive for congestion, rhinorrhea and sore throat.   Respiratory: Positive for cough.   All other systems reviewed and are negative.    Physical Exam Triage Vital Signs ED Triage Vitals  Enc Vitals Group     BP 06/17/17 1558 134/67     Pulse Rate 06/17/17 1558 (!) 107     Resp 06/17/17 1558 17     Temp 06/17/17 1558 98.1 F (36.7 C)     Temp Source 06/17/17 1558 Oral     SpO2 06/17/17 1558 100 %     Weight 06/17/17 1555 294 lb (133.4 kg)     Height 06/17/17 1555 5' 8.5" (1.74 m)     Head Circumference --      Peak Flow --      Pain Score 06/17/17 1557 7     Pain Loc --      Pain Edu? --      Excl. in GC? --    No data found.   Updated Vital Signs BP 134/67 (BP Location: Left Arm)   Pulse (!) 107   Temp 98.1 F (36.7 C) (Oral)   Resp 17   Ht 5' 8.5" (1.74 m)   Wt 294 lb (133.4 kg)    LMP 05/30/2017   SpO2 100%   BMI 44.05 kg/m   Visual Acuity Right Eye Distance:   Left Eye Distance:   Bilateral Distance:    Right Eye Near:   Left Eye Near:  Bilateral Near:     Physical Exam  Constitutional: She is oriented to person, place, and time. She appears well-developed and well-nourished. No distress.  HENT:  Head: Normocephalic.  Right Ear: External ear normal.  Left Ear: External ear normal.  Nose: Nose normal.  Mouth/Throat: Oropharynx is clear and moist. No oropharyngeal exudate.  Eyes: Pupils are equal, round, and reactive to light. Right eye exhibits no discharge. Left eye exhibits no discharge.  Neck: Normal range of motion.  Pulmonary/Chest: Effort normal and breath sounds normal. No respiratory distress.  Musculoskeletal: Normal range of motion.  Lymphadenopathy:    She has no cervical adenopathy.  Neurological: She is alert and oriented to person, place, and time.  Skin: Skin is warm and dry. She is not diaphoretic.  Psychiatric: She has a normal mood and affect. Her behavior is normal. Judgment and thought content normal.  Nursing note and vitals reviewed.    UC Treatments / Results  Labs (all labs ordered are listed, but only abnormal results are displayed) Labs Reviewed - No data to display  EKG  EKG Interpretation None       Radiology No results found.  Procedures Procedures (including critical care time)  Medications Ordered in UC Medications - No data to display   Initial Impression / Assessment and Plan / UC Course  I have reviewed the triage vital signs and the nursing notes.  Pertinent labs & imaging results that were available during my care of the patient were reviewed by me and considered in my medical decision making (see chart for details).     Plan: 1. Test/x-ray results and diagnosis reviewed with patient 2. rx as per orders; risks, benefits, potential side effects reviewed with patient 3. Recommend  supportive treatment with rest and fluids. Use Motrin or Tylenol for fever and body aches. Will provide Tessalon Perles for cough relief. The patient has opted to take Tamiflu. If she is not improving she should follow up in our clinic or with her primary care physician. 4. F/u prn if symptoms worsen or don't improve   Final Clinical Impressions(s) / UC Diagnoses   Final diagnoses:  Influenza-like illness    New Prescriptions Discharge Medication List as of 06/17/2017  4:16 PM       Controlled Substance Prescriptions  Controlled Substance Registry consulted? Not Applicable   Lutricia Feil, PA-C 06/17/17 1624

## 2017-06-17 NOTE — ED Triage Notes (Signed)
Patient complains of coughing, headache,ear pain sneezing, body aches x 2 days ago. Patient states that she is concerned that she may have the flu.

## 2017-06-21 ENCOUNTER — Telehealth: Payer: Self-pay

## 2017-06-21 NOTE — Telephone Encounter (Signed)
Pt saw results on MyChart and wants to be sure she understands them.  Please call.  [phone]  Pt aware pap is neg and HPV neg as well.

## 2017-07-02 ENCOUNTER — Telehealth: Payer: Self-pay | Admitting: Pharmacist

## 2017-07-02 NOTE — Telephone Encounter (Signed)
07/02/17 Faxed [employer] refill request for Prozac 40mg  Take 2 capsules (80mg ) by mouth daily at bedtime.[name]

## 2017-07-16 ENCOUNTER — Encounter: Payer: Self-pay | Admitting: Obstetrics and Gynecology

## 2017-07-18 ENCOUNTER — Ambulatory Visit: Payer: No Typology Code available for payment source | Attending: Family | Admitting: Physical Therapy

## 2017-07-18 DIAGNOSIS — R269 Unspecified abnormalities of gait and mobility: Secondary | ICD-10-CM

## 2017-07-18 DIAGNOSIS — G8929 Other chronic pain: Secondary | ICD-10-CM

## 2017-07-18 DIAGNOSIS — M25561 Pain in right knee: Secondary | ICD-10-CM | POA: Insufficient documentation

## 2017-07-18 DIAGNOSIS — M6281 Muscle weakness (generalized): Secondary | ICD-10-CM

## 2017-07-18 DIAGNOSIS — M25562 Pain in left knee: Secondary | ICD-10-CM | POA: Insufficient documentation

## 2017-07-18 DIAGNOSIS — M79672 Pain in left foot: Secondary | ICD-10-CM

## 2017-07-19 ENCOUNTER — Other Ambulatory Visit
Admission: RE | Admit: 2017-07-19 | Discharge: 2017-07-19 | Disposition: A | Discharge status: Home / Self Care | Payer: No Typology Code available for payment source | Source: Ambulatory Visit | Attending: Urology | Admitting: Urology

## 2017-07-19 DIAGNOSIS — N200 Calculus of kidney: Secondary | ICD-10-CM | POA: Insufficient documentation

## 2017-07-19 LAB — BASIC METABOLIC PANEL
Anion gap: 8 (ref 5–15)
BUN: 21 mg/dL — ABNORMAL HIGH (ref 6–20)
CALCIUM: 8.6 mg/dL — AB (ref 8.9–10.3)
CO2: 21 mmol/L — AB (ref 22–32)
CREATININE: 0.71 mg/dL (ref 0.44–1.00)
Chloride: 108 mmol/L (ref 101–111)
GFR calc non Af Amer: >60 mL/min (ref >60)
GLUCOSE: 119 mg/dL — AB (ref 65–99)
Potassium: 4.1 mmol/L (ref 3.5–5.1)
Sodium: 137 mmol/L (ref 135–145)

## 2017-07-20 ENCOUNTER — Encounter: Payer: Self-pay | Admitting: Physical Therapy

## 2017-07-20 NOTE — Therapy (Addendum)
[place] [facility] [address] [place], [place], [address] Phone: [phone]   Fax:  [phone]  Physical Therapy Evaluation  Patient Details  Name: Kelsey MonsLauren B Bell MRN: [id number] Date of Birth: 03/09/1981 Referring Provider: Beryle BeamsLisa Carnago, NP   Encounter Date: 07/18/2017    PT End of Session - 07/20/17 1401    Visit Number  1    Number of Visits  8    Date for PT Re-Evaluation  09/12/17    PT Start Time  1517    PT Stop Time  1615    PT Time Calculation (min)  58 min    Activity Tolerance  Patient limited by pain    Behavior During Therapy  [facility] for tasks assessed/performed     Past Medical History:  Diagnosis Date  . Allergy   . Anxiety   . Arthritis   . Asthma   . Breast pain present for several months   Bil LT >RT across of breasts  . Depression   . Kidney stones   . Migraine   . Neuromuscular disorder (HCC)    Complex regional pain syndrome and Fibromyalgia    Past Surgical History:  Procedure Laterality Date  . EXTRACORPOREAL SHOCK WAVE LITHOTRIPSY Right 04/26/2017   Procedure: EXTRACORPOREAL SHOCK WAVE LITHOTRIPSY (ESWL);  Surgeon: Vanna ScotlandBrandon, Ashley, MD;  Location: [facility] ORS;  Service: Urology;  Laterality: Right;  . KNEE ARTHROSCOPY Right   . TONSILLECTOMY      There were no vitals filed for this visit.     Pt. c/o persistent B knee pain and increase pain with prolonged standing/walking.  Pt. has been dealing with L foot pain due to work-related injury >1 year ago.  Pt. wearing a L ankle supportive brace.       See HEP.      Pt. is a 36 y/o female with chronic h/o B knee/ L ankle pain.  Pt. diagnosed with Fibromyalgia causing low back/ B hip discomfort with daily tasks and sleeping.  Pt. reports L ankle/foot pain was a work-related accident on 01/20/16.  L/R ankle figure 8: L 53 cm./ R 51 cm.  Pt. wearing heel lift in R shoe due to chronic LLD on R as compared to L.  Pt. states she is unable to tie L sneaker  due to swelling in L foot/ ankle.  R medial knee pain and tenderness over infrapatellar tendon.  R knee (+) valgus sign in supine/standing posture.  B hamstring/ hip flexor AROM WNL.  L/R ankle AROM:  DF: -21/6 deg., PF: 62/67 deg., IV: 30/46 deg., EV: -5/16 deg.  (pain limited with L ankle EV and DF)- pt. reports 12/10 L foot pain at worst with palpation/ DF.  Lumbar/ B hip AROM WFL.  B LE muscle strength grossly 4/5 MMT (pain limited with resisted knee extnesion/hip flexion).  Pt. ambulates with L antalgic gait pattern due to limited L ankle ROM but reports increase R medial knee pain with wt. bearing and step ups.  LEFS: 23 out of 80.  Pt. will benefit from skilled PT services to increase B LE muscle strength/ L ankle AROM to improve pain-free mobility/ gait.       PT Education - 07/31/17 0739    Education provided  Yes    Education Details  See handouts.      Person(s) Educated  Patient    Methods  Explanation;Demonstration;Handout    Comprehension  Verbalized understanding;Returned demonstration  PT Long Term Goals - 07/20/17 0817      PT LONG TERM GOAL #1   Title  Pt. I with HEP to increase L ankle AROM to WNL as compared to R ankle to improve pain-free mobility.      Baseline  L/R ankle AROM:  DF: -21/6 deg., PF: 62/67 deg., IV: 30/46 deg., EV: -5/16 deg.  (pain limited with L ankle EV and DF)- pt. reports 12/10 L foot pain at worst with palpation/ DF.    Time  8    Period  Weeks    Status  New    Target Date  09/12/17      PT LONG TERM GOAL #2   Title  Pt. will increase LEFS to >40 out of 80 to improve pain-free mobility/ return to work.      Baseline  LEFS: 23 out of 80 on 11/14    Time  8    Period  Weeks    Status  New    Target Date  09/12/17      PT LONG TERM GOAL #3   Title  Pt. will increase B hip flexor/ quad. strengthening to 4+/5 MMT to improve standing tolerance/ walking with more normalized gait pattern.     Baseline  B LE muscle strength grossly 4/5  MMT (pain limited).      Time  8    Period  Weeks    Status  New    Target Date  09/12/17      PT LONG TERM GOAL #4   Title  Pt. will ambulate 10 min. with normalized gait pattern with no increase c/o knee/foot pain.    Baseline  Pt. c/o >6/10 R knee/back/L foot pain with walking.  L antalgic gait pattern     Time  8    Period  Weeks    Status  New    Target Date  09/12/17        Patient will benefit from skilled therapeutic intervention in order to improve the following deficits and impairments:  Abnormal gait, Decreased strength, Increased muscle spasms, Postural dysfunction, Impaired perceived functional ability, Decreased activity tolerance, Decreased mobility, Impaired flexibility, Obesity, Decreased range of motion, Decreased balance, Pain, Decreased endurance  Visit Diagnosis: Chronic pain of left knee  Chronic pain of right knee  Pain in left foot  Gait difficulty  Muscle weakness (generalized)     Problem List Patient Active Problem List   Diagnosis Date Noted  . Kidney stone 04/25/2017  . Ureteral stone    Cammie McgeeMichael C Chantilly Linskey, PT, DPT # [id number] 07/20/2017, 8:27 AM  [place] [facility] [address] [place], [place], [address] Phone: [phone]   Fax:  [phone]  Name: Kelsey MonsLauren B Bell MRN: [id number] Date of Birth: 02/27/1981

## 2017-07-23 ENCOUNTER — Ambulatory Visit: Payer: Self-pay | Admitting: Obstetrics and Gynecology

## 2017-07-24 ENCOUNTER — Ambulatory Visit: Payer: No Typology Code available for payment source | Admitting: Physical Therapy

## 2017-07-24 DIAGNOSIS — R269 Unspecified abnormalities of gait and mobility: Secondary | ICD-10-CM

## 2017-07-24 DIAGNOSIS — M6281 Muscle weakness (generalized): Secondary | ICD-10-CM

## 2017-07-24 DIAGNOSIS — M79672 Pain in left foot: Secondary | ICD-10-CM

## 2017-07-24 DIAGNOSIS — M25562 Pain in left knee: Principal | ICD-10-CM

## 2017-07-24 DIAGNOSIS — M25561 Pain in right knee: Secondary | ICD-10-CM

## 2017-07-24 DIAGNOSIS — G8929 Other chronic pain: Secondary | ICD-10-CM

## 2017-07-25 ENCOUNTER — Encounter: Payer: Self-pay | Admitting: Physical Therapy

## 2017-07-25 NOTE — Therapy (Addendum)
[place] [facility] [address] [place], [place], [address] Phone: [phone]   Fax:  [phone]  Physical Therapy Treatment  Patient Details  Name: Kelsey Bell MRN: [id number] Date of Birth: 06/21/1981 Referring Provider: Beryle BeamsLisa Carnago, NP   Encounter Date: 07/24/2017    PT End of Session - 07/25/17 1401    Visit Number  2    Number of Visits  8    Date for PT Re-Evaluation  09/12/17    PT Start Time  1432    PT Stop Time  1529    PT Time Calculation (min)  57 min    Activity Tolerance  Patient limited by pain    Behavior During Therapy  [facility] for tasks assessed/performed     Past Medical History:  Diagnosis Date  . Allergy   . Anxiety   . Arthritis   . Asthma   . Breast pain present for several months   Bil LT >RT across of breasts  . Depression   . Kidney stones   . Migraine   . Neuromuscular disorder (HCC)    Complex regional pain syndrome and Fibromyalgia    Past Surgical History:  Procedure Laterality Date  . EXTRACORPOREAL SHOCK WAVE LITHOTRIPSY Right 04/26/2017   Procedure: EXTRACORPOREAL SHOCK WAVE LITHOTRIPSY (ESWL);  Surgeon: Vanna ScotlandBrandon, Ashley, MD;  Location: [facility]S;  Service: Urology;  Laterality: Right;  . KNEE ARTHROSCOPY Right   . TONSILLECTOMY      There were no vitals filed for this visit.      Pt. c/o R medial knee pain and tenderness currently.  Pt. states she has been compliant with HEP.  Pt. using L ankle brace today with shoe untied due to swelling in L foot.        Treatment:  Therex.:  Reviewed HEP.     Manual tx.:  Supine B hamstring/ hip stretching all planes with static holds as tolerated.  L ankle AA/PROM all planes to pain tolerable range.  L ankle subtalar mobs./ contract-relax gastroc stretches 4x each.  STM to R distal quad/ knee and L ankle.     Gait training:  Ambulate in PT clinic/ //-bars with focus on consistent R hip flexion/ step pattern and L heel strike without any  assistive device.  Pt. Benefits from use of R heel lift due to R knee valgus/ LLD.  Pt. Guarded with L ankle heel strike to toe off.  Discussed use of bracing/ SPC      R knee pain with supine quad sets and SAQ, focuing on proper patellar tracting.  Pt. has good technique with HEP.  Marked increase in L ankle active DF to neutral position today.  Pt. able to complete seated BAPS board with good L knee/ankle control and no increase c/o pain.  Mild L ankle swelling noted during PT tx. session.  Pt. encouraged to wean out of ankle bracing to promote increase ROM/ strengthening and improve overall swelling with daily tasks.       PT Education - 07/31/17 0739    Education provided  Yes    Education Details  See handouts.      Person(s) Educated  Patient    Methods  Explanation;Demonstration;Handout    Comprehension  Verbalized understanding;Returned demonstration          PT Long Term Goals - 07/20/17 0817      PT LONG TERM GOAL #1   Title  Pt. I with HEP to increase L ankle  AROM to WNL as compared to R ankle to improve pain-free mobility.      Baseline  L/R ankle AROM:  DF: -21/6 deg., PF: 62/67 deg., IV: 30/46 deg., EV: -5/16 deg.  (pain limited with L ankle EV and DF)- pt. reports 12/10 L foot pain at worst with palpation/ DF.    Time  8    Period  Weeks    Status  New    Target Date  09/12/17      PT LONG TERM GOAL #2   Title  Pt. will increase LEFS to >40 out of 80 to improve pain-free mobility/ return to work.      Baseline  LEFS: 23 out of 80 on 11/14    Time  8    Period  Weeks    Status  New    Target Date  09/12/17      PT LONG TERM GOAL #3   Title  Pt. will increase B hip flexor/ quad. strengthening to 4+/5 MMT to improve standing tolerance/ walking with more normalized gait pattern.     Baseline  B LE muscle strength grossly 4/5 MMT (pain limited).      Time  8    Period  Weeks    Status  New    Target Date  09/12/17      PT LONG TERM GOAL #4   Title  Pt. will  ambulate 10 min. with normalized gait pattern with no increase c/o knee/foot pain.    Baseline  Pt. c/o >6/10 R knee/back/L foot pain with walking.  L antalgic gait pattern     Time  8    Period  Weeks    Status  New    Target Date  09/12/17         Patient will benefit from skilled therapeutic intervention in order to improve the following deficits and impairments:  Abnormal gait, Decreased strength, Increased muscle spasms, Postural dysfunction, Impaired perceived functional ability, Decreased activity tolerance, Decreased mobility, Impaired flexibility, Obesity, Decreased range of motion, Decreased balance, Pain, Decreased endurance  Visit Diagnosis: Chronic pain of left knee  Chronic pain of right knee  Pain in left foot  Gait difficulty  Muscle weakness (generalized)     Problem List Patient Active Problem List   Diagnosis Date Noted  . Kidney stone 04/25/2017  . Ureteral stone    Cammie McgeeMichael C Abass Misener, PT, DPT # [id number] 07/31/2017, 9:21 AM  [place] [facility] [address] [place], [place], [address] Phone: [phone]   Fax:  [phone]  Name: Kelsey Bell MRN: [id number] Date of Birth: 09/27/1980

## 2017-07-31 ENCOUNTER — Ambulatory Visit: Payer: Worker's Compensation | Attending: Physical Medicine and Rehabilitation | Admitting: Physical Therapy

## 2017-07-31 DIAGNOSIS — M25561 Pain in right knee: Secondary | ICD-10-CM | POA: Insufficient documentation

## 2017-07-31 DIAGNOSIS — M79672 Pain in left foot: Secondary | ICD-10-CM | POA: Insufficient documentation

## 2017-07-31 DIAGNOSIS — R269 Unspecified abnormalities of gait and mobility: Secondary | ICD-10-CM | POA: Diagnosis present

## 2017-07-31 DIAGNOSIS — M6281 Muscle weakness (generalized): Secondary | ICD-10-CM | POA: Insufficient documentation

## 2017-07-31 DIAGNOSIS — G8929 Other chronic pain: Secondary | ICD-10-CM | POA: Diagnosis present

## 2017-07-31 NOTE — Addendum Note (Signed)
Addended by: Cammie McgeeSHERK, MICHAEL C on: 07/31/2017 08:34 AM   Modules accepted: Orders

## 2017-08-02 ENCOUNTER — Other Ambulatory Visit: Payer: Self-pay

## 2017-08-02 ENCOUNTER — Ambulatory Visit
Admission: EM | Admit: 2017-08-02 | Discharge: 2017-08-02 | Disposition: A | Discharge status: Home / Self Care | Payer: No Typology Code available for payment source | Attending: Family Medicine | Admitting: Family Medicine

## 2017-08-02 DIAGNOSIS — R21 Rash and other nonspecific skin eruption: Secondary | ICD-10-CM

## 2017-08-02 NOTE — ED Provider Notes (Signed)
[facility]    CSN: [id number] Arrival date & time: 08/02/17  1815   History   Chief Complaint Chief Complaint  Patient presents with  . Rash    HPI  36 year old female presents with rash.  Patient reports that she has an area of redness under her left eye.  Started today.  She states that it is stinging/burning and is also tender to palpation.  No known inciting factor.  She has applied Aveeno lotion, changed her contacts  without improvement.  She contacted her skin care professional and was encouraged to see the doctor.  No known exacerbating factors other than palpation.  No associated fevers or chills.  No new exposures or contacts.  No other complaints or concerns at this time.  Past Medical History:  Diagnosis Date  . Allergy   . Anxiety   . Arthritis   . Asthma   . Breast pain present for several months   Bil LT >RT across of breasts  . Depression   . Kidney stones   . Migraine   . Neuromuscular disorder (HCC)    Complex regional pain syndrome and Fibromyalgia   Patient Active Problem List   Diagnosis Date Noted  . Kidney stone 04/25/2017  . Ureteral stone    Past Surgical History:  Procedure Laterality Date  . EXTRACORPOREAL SHOCK WAVE LITHOTRIPSY Right 04/26/2017   Procedure: EXTRACORPOREAL SHOCK WAVE LITHOTRIPSY (ESWL);  Surgeon: Vanna ScotlandBrandon, Ashley, MD;  Location: [facility] ORS;  Service: Urology;  Laterality: Right;  . KNEE ARTHROSCOPY Right   . TONSILLECTOMY     OB History    Gravida Para Term Preterm AB Living   0 0 0 0 0 0   SAB TAB Ectopic Multiple Live Births   0 0 0 0 0       Home Medications    Prior to Admission medications   Medication Sig Start Date End Date Taking? Authorizing Provider  acetaminophen (TYLENOL) 500 MG tablet Take 1,000 mg by mouth every evening.     [provider]  albuterol (PROVENTIL HFA;VENTOLIN HFA) 108 (90 Base) MCG/ACT inhaler Inhale 1 puff into the lungs as needed for wheezing or shortness of  breath.    Mertie MooresFleming, Herbon E, MD  ALPRAZolam Prudy Feeler(XANAX) 0.25 MG tablet Take 0.25 mg by mouth as needed for anxiety.    [provider]  benzonatate (TESSALON) 200 MG capsule Take one cap TID PRN cough 06/17/17   Lutricia Feiloemer, William P, PA-C  budesonide-formoterol [facility](SYMBICORT) 160-4.5 MCG/ACT inhaler Inhale 2 puffs into the lungs 2 (two) times daily. Using as needed    Mertie MooresFleming, Herbon E, MD  calcium carbonate (OS-CAL) 600 MG TABS tablet Take 600 mg by mouth daily.    [provider]  Cholecalciferol (VITAMIN D3) 5000 units TABS Take 10,000 Units by mouth daily.     [provider]  cyclobenzaprine (FLEXERIL) 5 MG tablet Take by mouth as needed.  08/10/16   [provider]  FLUoxetine (PROZAC) 40 MG capsule Take 80 mg by mouth daily.    [provider]  fluticasone (FLONASE) 50 MCG/ACT nasal spray Place 2 sprays into the nose 2 (two) times daily. 06/07/17   [provider]  HYDROcodone-acetaminophen (NORCO) 5-325 MG tablet Take 1 tablet by mouth every 6 (six) hours as needed for moderate pain. 04/26/17   Adrian SaranMody, Sital, MD  ipratropium-albuterol (DUONEB) 0.5-2.5 (3) MG/3ML SOLN Take 3 mLs by nebulization as needed.    Mertie MooresFleming, Herbon E, MD  Lidocaine-Menthol 4-4 % PTCH  Apply 1 patch topically every 12 (twelve) hours.    Sheran Luz, MD  montelukast (SINGULAIR) 10 MG tablet Take 10 mg by mouth at bedtime.    [provider]  Multiple Vitamin (MULTI-VITAMINS) TABS Take 1 tablet by mouth daily.    [provider]  oseltamivir (TAMIFLU) 75 MG capsule Take 1 capsule (75 mg total) by mouth every 12 (twelve) hours. 06/17/17   Lutricia Feil, PA-C  promethazine (PHENERGAN) 25 MG tablet Take 1 tablet (25 mg total) by mouth every 6 (six) hours as needed for nausea or vomiting. 06/06/17   [name], MD  tamsulosin (FLOMAX) 0.4 MG CAPS capsule Take 1 capsule (0.4 mg total) by mouth daily. 05/17/17   Michiel Cowboy A, PA-C  topiramate (TOPAMAX) 50  MG tablet Take 250 mg by mouth at bedtime.     [provider]    Family History Family History  Adopted: Yes  Problem Relation Age of Onset  . Alcohol abuse Mother   . Depression Mother   . Drug abuse Mother   . Mental illness Mother   . Alcohol abuse Father   . Arthritis Father   . Asthma Father   . COPD Father   . Depression Father   . Diabetes Father   . Drug abuse Father   . Early death Father   . Heart disease Father   . Hyperlipidemia Father   . Hypertension Father   . Kidney disease Father   . Stroke Father   . Diabetes Brother   . Depression Brother   . Kidney cancer Neg Hx   . Bladder Cancer Neg Hx     Social History Social History   Tobacco Use  . Smoking status: Never Smoker  . Smokeless tobacco: Never Used  Substance Use Topics  . Alcohol use: No  . Drug use: No     Allergies   Cefdinir; Esomeprazole; Esomeprazole magnesium; Gabapentin; Hydroxychloroquine; Tapentadol; Nsaids; Sumatriptan succinate; Cortisone; Duloxetine; Prednisone; Amoxicillin; Amoxicillin-pot clavulanate; Reglan [metoclopramide]; Sulfa antibiotics; and Sulfasalazine   Review of Systems Review of Systems  Constitutional: Negative.   Skin: Positive for rash.       Redness.   Physical Exam Triage Vital Signs ED Triage Vitals  Enc Vitals Group     BP 08/02/17 1830 123/71     Pulse Rate 08/02/17 1830 86     Resp --      Temp 08/02/17 1830 98.1 F (36.7 C)     Temp Source 08/02/17 1830 Oral     SpO2 08/02/17 1830 99 %     Weight 08/02/17 1826 270 lb (122.5 kg)     Height 08/02/17 1826 5\' 8"  (1.727 m)     Head Circumference --      Peak Flow --      Pain Score 08/02/17 1826 6     Pain Loc --      Pain Edu? --      Excl. in GC? --    No data found.  Updated Vital Signs BP 123/71 (BP Location: Left Arm)   Pulse 86   Temp 98.1 F (36.7 C) (Oral)   Ht 5\' 8"  (1.727 m)   Wt 270 lb (122.5 kg)   SpO2 99%   BMI 41.05 kg/m   Physical Exam  Constitutional:  She is oriented to person, place, and time. She appears well-developed and well-nourished. No distress.  Eyes: Conjunctivae are normal. Right eye exhibits no discharge. Left eye exhibits no discharge.  Cardiovascular:  Normal rate and regular rhythm.  No murmur heard. Pulmonary/Chest: Effort normal and breath sounds normal. She has no wheezes. She has no rales.  Neurological: She is alert and oriented to person, place, and time.  Skin:  Small area of redness below the left eye.  No fluctuance.  No drainage.  No visible irritation.  Dry.  Psychiatric: She has a normal mood and affect. Her behavior is normal.  Vitals reviewed.   UC Treatments / Results  Labs (all labs ordered are listed, but only abnormal results are displayed) Labs Reviewed - No data to display  EKG  EKG Interpretation None       Radiology No results found.  Procedures Procedures (including critical care time)  Medications Ordered in UC Medications - No data to display   Initial Impression / Assessment and Plan / UC Course  I have reviewed the triage vital signs and the nursing notes.  Pertinent labs & imaging results that were available during my care of the patient were reviewed by me and considered in my medical decision making (see chart for details).     36 year old female presents with a small area of erythema below her left eye.  No evidence of periorbital or orbital cellulitis.  Well-appearing.  Appears benign.  Likely contact dermatitis.  Advised over-the-counter hydrocortisone.  Final Clinical Impressions(s) / UC Diagnoses   Final diagnoses:  Rash    ED Discharge Orders    None      Controlled Substance Prescriptions [place] Controlled Substance Registry consulted? Not Applicable   Tommie SamsCook, Feiga Nadel G, DO 08/02/17 [id number]

## 2017-08-02 NOTE — ED Triage Notes (Signed)
1 day hx red rash under left eye.  Stinging, burning and tender to touch.  No known events or injuries

## 2017-08-02 NOTE — Discharge Instructions (Signed)
Use a small amount of hydrocortisone 2 times daily as needed.  Do not use for more than 1 week.  Follow up with your primary if needed.  Take care  Dr. Adriana Simasook

## 2017-08-03 NOTE — Therapy (Addendum)
[facility] [address] [place], [place], [address] Phone: [phone]   Fax:  [phone]  Physical Therapy Evaluation  Patient Details  Name: Kelsey Bell MRN: [id number] Date of Birth: 01/24/1981 Referring Provider: Dr. Ethelene Halamos   Encounter Date: 07/31/2017    PT End of Session - 08/03/17 1221    Visit Number  1    Number of Visits  8    Date for PT Re-Evaluation  09/25/17    PT Start Time  1433    PT Stop Time  1529    PT Time Calculation (min)  56 min    Activity Tolerance  Patient limited by pain    Behavior During Therapy  [facility] for tasks assessed/performed      Past Medical History:  Diagnosis Date  . Allergy   . Anxiety   . Arthritis   . Asthma   . Breast pain present for several months   Bil LT >RT across of breasts  . Depression   . Kidney stones   . LGSIL on Pap smear of cervix 2007  . Migraine   . Neuromuscular disorder (HCC)    Complex regional pain syndrome and Fibromyalgia    Past Surgical History:  Procedure Laterality Date  . COLPOSCOPY  2017   neg bx  . EXTRACORPOREAL SHOCK WAVE LITHOTRIPSY Right 04/26/2017   Procedure: EXTRACORPOREAL SHOCK WAVE LITHOTRIPSY (ESWL);  Surgeon: Vanna ScotlandBrandon, Ashley, MD;  Location: [facility] ORS;  Service: Urology;  Laterality: Right;  . KNEE ARTHROSCOPY Right   . TONSILLECTOMY      There were no vitals filed for this visit.     Pt. reports chronic L foot pain since work related accident.  Pt. states she has persistent burning pain on top of L foot.  Pt. unable to tie shoe due to swelling/pain.          See HEP         Pt. is a 36 y/o female with chronic h/o B knee/ L ankle pain. Pt. reports L ankle/foot pain was a work-related accident on 01/20/16. L/R ankle figure 8: L 52 cm./ R 51 cm. (minimal dorsal foot swelling noted).  Pt. states she is unable to tie L sneaker due to swelling in L foot/ ankle.   Pt. wearing heel lift in R shoe due to chronic LLD on R as  compared to L.  R medial knee pain and tenderness over infrapatellar tendon. R knee (+) valgus sign in supine/standing posture. B hamstring/ hip flexor AROM WNL. L/R ankle AROM: DF: -11 (pain focused)/6 deg., PF: 62/67 deg., IV: 30/46 deg., EV: -5/16 deg. (pain limited with L ankle EV and DF)- pt. reports >10/10 L foot pain at worst with palpation/ DF.  Lumbar/ B hip AROM WFL.  B LE muscle strength grossly 4/5 MMT (pain limited with resisted knee extnesion/hip flexion).  Pt. ambulates with L antalgic gait pattern due to limited L ankle ROM but reports increase R medial knee pain/ crepitus with wt. bearing and step ups.   LEFS: 21 out of 80.   Pt. will benefit from skilled PT services to increase B LE muscle strength/ L ankle AROM to improve pain-free mobility/ gait.      PT Long Term Goals - 08/01/17 [id number]      PT LONG TERM GOAL #1   Title  Pt. I with HEP to increase L ankle AROM to WNL as compared to R ankle to improve pain-free mobility.  Baseline  L/R ankle AROM:  DF: -11/6 deg., PF: 62/67 deg., IV: 30/46 deg., EV: -5/16 deg.  (pain limited with L ankle EV and DF)- pt. reports 12/10 L foot pain at worst with palpation/ DF.    Time  8    Period  Weeks    Status  New    Target Date  09/25/17      PT LONG TERM GOAL #2   Title  Pt. will increase LEFS to >40 out of 80 to improve pain-free mobility/ return to work.      Baseline  LEFS: 21 out of 80 on 11/27    Time  8    Period  Weeks    Status  New    Target Date  09/25/17      PT LONG TERM GOAL #3   Title  Pt. will increase B hip flexor/ quad. strengthening to 4+/5 MMT to improve standing tolerance/ walking with more normalized gait pattern.     Baseline  B LE muscle strength grossly 4/5 MMT (pain limited).      Time  8    Period  Weeks    Status  New    Target Date  09/25/17      PT LONG TERM GOAL #4   Title  Pt. will ambulate 10 min. with normalized gait pattern with no increase c/o knee/foot pain.    Baseline  Pt. c/o >6/10 R  knee/back/L foot pain with walking.  L antalgic gait pattern     Time  8    Period  Weeks    Status  New    Target Date  09/25/17               Patient will benefit from skilled therapeutic intervention in order to improve the following deficits and impairments:  Abnormal gait, Decreased strength, Increased muscle spasms, Postural dysfunction, Impaired perceived functional ability, Decreased activity tolerance, Decreased mobility, Impaired flexibility, Obesity, Decreased range of motion, Decreased balance, Pain, Decreased endurance  Visit Diagnosis: Pain in left foot  Chronic pain of right knee  Gait difficulty  Muscle weakness (generalized)     Problem List Patient Active Problem List   Diagnosis Date Noted  . Kidney stone 04/25/2017  . Ureteral stone    Cammie McgeeMichael C Sherk, PT, DPT # [phone] 08/03/2017, 7:24 AM  [facility] [address] [place], [place], [address] Phone: [phone]   Fax:  [phone]  Name: Kelsey Bell MRN: [id number] Date of Birth: 01/12/1981

## 2017-08-04 ENCOUNTER — Telehealth: Payer: Self-pay | Admitting: *Deleted

## 2017-08-04 NOTE — Telephone Encounter (Signed)
Patient called with complaint of continue rash discomfort below her left eye. Patient reports burning at the rash site when she applies OTC hydrocortisone as directed by Dr Adriana Simasook and that the rash has not improved or become worse. After conferring with Dr Adriana Simasook I advised patient to stop using the hydrocortisone cream and follow up with her PCP if symptoms do not improved or follow up with [facility] or ED if symptoms suddenly become worse. Patient confirmed understanding of information.

## 2017-08-07 ENCOUNTER — Other Ambulatory Visit: Payer: Self-pay | Admitting: Urology

## 2017-08-07 ENCOUNTER — Ambulatory Visit
Admission: RE | Admit: 2017-08-07 | Discharge: 2017-08-07 | Disposition: A | Discharge status: Home / Self Care | Payer: No Typology Code available for payment source | Source: Ambulatory Visit | Attending: Urology | Admitting: Urology

## 2017-08-07 ENCOUNTER — Ambulatory Visit: Payer: Worker's Compensation | Attending: Physical Medicine and Rehabilitation | Admitting: Physical Therapy

## 2017-08-07 DIAGNOSIS — N200 Calculus of kidney: Secondary | ICD-10-CM

## 2017-08-07 DIAGNOSIS — R109 Unspecified abdominal pain: Secondary | ICD-10-CM | POA: Insufficient documentation

## 2017-08-07 DIAGNOSIS — M79672 Pain in left foot: Secondary | ICD-10-CM

## 2017-08-07 DIAGNOSIS — M25561 Pain in right knee: Secondary | ICD-10-CM | POA: Insufficient documentation

## 2017-08-07 DIAGNOSIS — Z87442 Personal history of urinary calculi: Secondary | ICD-10-CM | POA: Insufficient documentation

## 2017-08-07 DIAGNOSIS — G8929 Other chronic pain: Secondary | ICD-10-CM | POA: Diagnosis present

## 2017-08-07 DIAGNOSIS — R269 Unspecified abnormalities of gait and mobility: Secondary | ICD-10-CM | POA: Diagnosis present

## 2017-08-07 DIAGNOSIS — M6281 Muscle weakness (generalized): Secondary | ICD-10-CM

## 2017-08-08 ENCOUNTER — Encounter: Payer: Self-pay | Admitting: Obstetrics and Gynecology

## 2017-08-10 NOTE — Therapy (Addendum)
[facility] [address] [place], [place], [address] Phone: [phone]   Fax:  [phone]  Physical Therapy Treatment  Patient Details  Name: Kelsey Bell MRN: [id number] Date of Birth: 12/07/1980 Referring Provider: Dr. Ethelene Halamos   Encounter Date: 08/07/2017  PT End of Session - 08/10/17 1533    Visit Number  2    Number of Visits  8    Date for PT Re-Evaluation  09/25/17    PT Start Time  1434    PT Stop Time  1532    PT Time Calculation (min)  58 min    Activity Tolerance  Patient limited by pain    Behavior During Therapy  [facility] for tasks assessed/performed       Past Medical History:  Diagnosis Date  . Allergy   . Anxiety   . Arthritis   . Asthma   . Breast pain present for several months   Bil LT >RT across of breasts  . Depression   . Kidney stones   . LGSIL on Pap smear of cervix 2007  . Migraine   . Neuromuscular disorder (HCC)    Complex regional pain syndrome and Fibromyalgia    Past Surgical History:  Procedure Laterality Date  . COLPOSCOPY  2017   neg bx  . EXTRACORPOREAL SHOCK WAVE LITHOTRIPSY Right 04/26/2017   Procedure: EXTRACORPOREAL SHOCK WAVE LITHOTRIPSY (ESWL);  Surgeon: Vanna ScotlandBrandon, Ashley, MD;  Location: [facility] ORS;  Service: Urology;  Laterality: Right;  . KNEE ARTHROSCOPY Right   . TONSILLECTOMY      There were no vitals filed for this visit.    Pt. not wearing ankle brace today but has on compression stockings.  Pt. is not tying shoes due to increase pain/ swelling reported.           Treatment:  There.ex.:  Seated BAPS board (all planes).  GTB L/R ankle (all planes)- 20x each.  Step ups/ downs.  Tandem stance/ gait/ wt. Shifting in //-bars with no UE assist.  Manual isometrics to L foot 5x (min. To moderate resistance).   Reviewed HEP.  Manual tx.:  Supine L toe/ ankle stretches (all planes) static holds as tolerated.  Focused on toe extension.  STM from distal to proximal L foot/  lower leg in supine position.  No swelling on foot noted as compared to R.    Ice to L foot after tx.        Pts. pain in L foot ranges from 4-6/10 t/o tx. session.  PT encouraged pt. to increase toe extension during toe-off phase of gait.  Pt. progressing well with ankle stability ex. program/ tandem stance and gait.  No edema noted in L foot during stretches/ manual techniques.       PT Long Term Goals - 07/20/17 0817      PT LONG TERM GOAL #1   Title  Pt. I with HEP to increase L ankle AROM to WNL as compared to R ankle to improve pain-free mobility.      Baseline  L/R ankle AROM:  DF: -21/6 deg., PF: 62/67 deg., IV: 30/46 deg., EV: -5/16 deg.  (pain limited with L ankle EV and DF)- pt. reports 12/10 L foot pain at worst with palpation/ DF.    Time  8    Period  Weeks    Status  New    Target Date  09/12/17      PT LONG TERM GOAL #2   Title  Pt. will increase LEFS to >40 out of 80 to improve pain-free mobility/ return to work.      Baseline  LEFS: 23 out of 80 on 11/14    Time  8    Period  Weeks    Status  New    Target Date  09/12/17      PT LONG TERM GOAL #3   Title  Pt. will increase B hip flexor/ quad. strengthening to 4+/5 MMT to improve standing tolerance/ walking with more normalized gait pattern.     Baseline  B LE muscle strength grossly 4/5 MMT (pain limited).      Time  8    Period  Weeks    Status  New    Target Date  09/12/17      PT LONG TERM GOAL #4   Title  Pt. will ambulate 10 min. with normalized gait pattern with no increase c/o knee/foot pain.    Baseline  Pt. c/o >6/10 R knee/back/L foot pain with walking.  L antalgic gait pattern     Time  8    Period  Weeks    Status  New    Target Date  09/12/17            Plan - 08/10/17 1534    Clinical Presentation  Unstable    Clinical Decision Making  Moderate    Rehab Potential  Fair    PT Frequency  1x / week    PT Duration  8 weeks    PT Treatment/Interventions  ADLs/Self Care Home  Management;Aquatic Therapy;Cryotherapy;Iontophoresis 4mg /ml Dexamethasone;Ultrasound;Patient/family education;Neuromuscular re-education;Balance training;Therapeutic exercise;Therapeutic activities;Functional mobility training;Stair training;Gait training;Manual techniques;Passive range of motion;Dry needling    PT Next Visit Plan  Progress HEP/ strengthening ex.     PT Home Exercise Plan  See handouts.      Consulted and Agree with Plan of Care  Patient       Patient will benefit from skilled therapeutic intervention in order to improve the following deficits and impairments:  Abnormal gait, Decreased strength, Increased muscle spasms, Postural dysfunction, Impaired perceived functional ability, Decreased activity tolerance, Decreased mobility, Impaired flexibility, Obesity, Decreased range of motion, Decreased balance, Pain, Decreased endurance  Visit Diagnosis: Pain in left foot  Chronic pain of right knee  Gait difficulty  Muscle weakness (generalized)     Problem List Patient Active Problem List   Diagnosis Date Noted  . Kidney stone 04/25/2017  . Ureteral stone    Cammie McgeeMichael C Barnie Sopko, PT, DPT # [phone] 08/10/2017, 3:36 PM  [place] [facility] [address] [place], [place], [address] Phone: [phone]   Fax:  [phone]  Name: Kelsey Bell MRN: [id number] Date of Birth: 10/05/1980

## 2017-08-14 ENCOUNTER — Ambulatory Visit: Payer: Worker's Compensation | Admitting: Physical Therapy

## 2017-08-16 ENCOUNTER — Other Ambulatory Visit: Payer: Self-pay | Admitting: Urology

## 2017-08-16 DIAGNOSIS — N200 Calculus of kidney: Secondary | ICD-10-CM

## 2017-08-17 ENCOUNTER — Ambulatory Visit: Payer: Worker's Compensation | Admitting: Physical Therapy

## 2017-08-17 DIAGNOSIS — G8929 Other chronic pain: Secondary | ICD-10-CM

## 2017-08-17 DIAGNOSIS — M79672 Pain in left foot: Secondary | ICD-10-CM | POA: Diagnosis not present

## 2017-08-17 DIAGNOSIS — R269 Unspecified abnormalities of gait and mobility: Secondary | ICD-10-CM

## 2017-08-17 DIAGNOSIS — M25561 Pain in right knee: Secondary | ICD-10-CM

## 2017-08-17 DIAGNOSIS — M6281 Muscle weakness (generalized): Secondary | ICD-10-CM

## 2017-08-20 ENCOUNTER — Encounter: Payer: Self-pay | Admitting: Physical Therapy

## 2017-08-20 NOTE — Therapy (Addendum)
[place] [facility] [address] [place], [place], [address] Phone: [phone]   Fax:  [phone]  Physical Therapy Treatment  Patient Details  Name: Kelsey MonsLauren B Bell MRN: [id number] Date of Birth: 10/01/1980 Referring Provider: Dr. Ethelene Halamos   Encounter Date: 08/17/2017  PT End of Session - 08/20/17 1950    Visit Number  3    Number of Visits  8    Date for PT Re-Evaluation  09/25/17    PT Start Time  1106    PT Stop Time  1201    PT Time Calculation (min)  55 min    Activity Tolerance  Patient limited by pain    Behavior During Therapy  [facility] for tasks assessed/performed       Past Medical History:  Diagnosis Date  . Allergy   . Anxiety   . Arthritis   . Asthma   . Breast pain present for several months   Bil LT >RT across of breasts  . Depression   . Kidney stones   . LGSIL on Pap smear of cervix 2007  . Migraine   . Neuromuscular disorder (HCC)    Complex regional pain syndrome and Fibromyalgia    Past Surgical History:  Procedure Laterality Date  . COLPOSCOPY  2017   neg bx  . EXTRACORPOREAL SHOCK WAVE LITHOTRIPSY Right 04/26/2017   Procedure: EXTRACORPOREAL SHOCK WAVE LITHOTRIPSY (ESWL);  Surgeon: Vanna ScotlandBrandon, Ashley, MD;  Location: [facility] ORS;  Service: Urology;  Laterality: Right;  . KNEE ARTHROSCOPY Right   . TONSILLECTOMY      There were no vitals filed for this visit.    Pt. states "my foot hurts so bad today".  Pt. still refuses to lace shoes due to pain/ sensitivity.  Pt. reports she twisted her R ankle the other day (no swelling or redness noted).         Treatment:  There.ex.:  Scifit L6 10 min. Prostretch 4x each (UE assist in //-bars). BOSU step ups/ downs. Airex (NBOS/ tandem/ step ups (forward/ lateral). Resisted gait 2BTB 5x all 4-planes. Standing heel raises 20x. TG 6x (limited by R knee pain).   Manual tx.:  Supine L ankle stretches (all planes)-static holds. STM to dorsal/lateral aspect of L  foot.  Discussed desensitization of L foot/ ankle with STM.      No increase c/o L foot pain during higher level ankle stability/ balance ex.  R knee pain limits squats/ step ups/ TG machine.  Moderate crepitus noted in R knee and PT stopped TG ex. during tx. today.  Light UE assist with BOSU step ups for balance/ safety.     PT Long Term Goals - 07/20/17 0817      PT LONG TERM GOAL #1   Title  Pt. I with HEP to increase L ankle AROM to WNL as compared to R ankle to improve pain-free mobility.      Baseline  L/R ankle AROM:  DF: -21/6 deg., PF: 62/67 deg., IV: 30/46 deg., EV: -5/16 deg.  (pain limited with L ankle EV and DF)- pt. reports 12/10 L foot pain at worst with palpation/ DF.    Time  8    Period  Weeks    Status  New    Target Date  09/12/17      PT LONG TERM GOAL #2   Title  Pt. will increase LEFS to >40 out of 80 to improve pain-free mobility/ return to work.  Baseline  LEFS: 23 out of 80 on 11/14    Time  8    Period  Weeks    Status  New    Target Date  09/12/17      PT LONG TERM GOAL #3   Title  Pt. will increase B hip flexor/ quad. strengthening to 4+/5 MMT to improve standing tolerance/ walking with more normalized gait pattern.     Baseline  B LE muscle strength grossly 4/5 MMT (pain limited).      Time  8    Period  Weeks    Status  New    Target Date  09/12/17      PT LONG TERM GOAL #4   Title  Pt. will ambulate 10 min. with normalized gait pattern with no increase c/o knee/foot pain.    Baseline  Pt. c/o >6/10 R knee/back/L foot pain with walking.  L antalgic gait pattern     Time  8    Period  Weeks    Status  New    Target Date  09/12/17            Plan - 08/20/17 1951    Clinical Presentation  Unstable    Clinical Decision Making  Moderate    Rehab Potential  Fair    PT Frequency  1x / week    PT Duration  8 weeks    PT Treatment/Interventions  ADLs/Self Care Home Management;Aquatic Therapy;Cryotherapy;Iontophoresis 4mg /ml  Dexamethasone;Ultrasound;Patient/family education;Neuromuscular re-education;Balance training;Therapeutic exercise;Therapeutic activities;Functional mobility training;Stair training;Gait training;Manual techniques;Passive range of motion;Dry needling    PT Next Visit Plan  Progress HEP/ strengthening ex.     PT Home Exercise Plan  See handouts.         Patient will benefit from skilled therapeutic intervention in order to improve the following deficits and impairments:  Abnormal gait, Decreased strength, Increased muscle spasms, Postural dysfunction, Impaired perceived functional ability, Decreased activity tolerance, Decreased mobility, Impaired flexibility, Obesity, Decreased range of motion, Decreased balance, Pain, Decreased endurance  Visit Diagnosis: Pain in left foot  Chronic pain of right knee  Gait difficulty  Muscle weakness (generalized)     Problem List Patient Active Problem List   Diagnosis Date Noted  . Kidney stone 04/25/2017  . Ureteral stone    Cammie McgeeMichael C Keneshia Tena, PT, DPT # [id number] 08/20/2017, 7:53 PM  [place] [facility] [address] [place], [place], [address] Phone: [phone]   Fax:  [phone]  Name: Kelsey MonsLauren B Bell MRN: [id number] Date of Birth: 09/22/1980

## 2017-08-21 ENCOUNTER — Ambulatory Visit: Payer: Worker's Compensation | Admitting: Physical Therapy

## 2017-08-21 DIAGNOSIS — R269 Unspecified abnormalities of gait and mobility: Secondary | ICD-10-CM

## 2017-08-21 DIAGNOSIS — M25561 Pain in right knee: Secondary | ICD-10-CM

## 2017-08-21 DIAGNOSIS — M6281 Muscle weakness (generalized): Secondary | ICD-10-CM

## 2017-08-21 DIAGNOSIS — M79672 Pain in left foot: Secondary | ICD-10-CM | POA: Diagnosis not present

## 2017-08-21 DIAGNOSIS — G8929 Other chronic pain: Secondary | ICD-10-CM

## 2017-08-24 NOTE — Therapy (Addendum)
[place] [facility] [address] [place], [place], [address] Phone: [phone]   Fax:  [phone]  Physical Therapy Treatment  Patient Details  Name: Kelsey Bell MRN: [id number] Date of Birth: 04/10/1981 Referring Provider: Dr. Ethelene Halamos   Encounter Date: 08/21/2017    Past Medical History:  Diagnosis Date  . Allergy   . Anxiety   . Arthritis   . Asthma   . Breast pain present for several months   Bil LT >RT across of breasts  . Depression   . Kidney stones   . LGSIL on Pap smear of cervix 2007  . Migraine   . Neuromuscular disorder (HCC)    Complex regional pain syndrome and Fibromyalgia    Past Surgical History:  Procedure Laterality Date  . COLPOSCOPY  2017   neg bx  . EXTRACORPOREAL SHOCK WAVE LITHOTRIPSY Right 04/26/2017   Procedure: EXTRACORPOREAL SHOCK WAVE LITHOTRIPSY (ESWL);  Surgeon: Vanna ScotlandBrandon, Ashley, MD;  Location: [facility] ORS;  Service: Urology;  Laterality: Right;  . KNEE ARTHROSCOPY Right   . TONSILLECTOMY      There were no vitals filed for this visit.    Pt. states her R knee is hurting more that L foot.  Pt. entered PT with L shoe untied due to swelling.         Treatment:  There.ex.:  Standing prostretch 5x with 20 sec. Holds.  Tandem stance/ gait in //-bars with min. To no UE assist. TG knee flexion (partial flexion secondary to R knee pain).  Heel raises with eccentric muscle control 20x. Seated heel raises 20x.  Supine L ankle AROM/ alphabet/ isometrics all planes.   Scifit L5 10 min. (cuing to keep L heel on foot plate).  Discussed HEP.   Manual tx.:  Supine L ankle AA/PROM all planes of movement with static holds.  Gentle contract-relax technique to increase DF but pt. Pain focused/limited.  L ankle subtalar mobs. Grade II 3x20 sec.  STM to L dorsal aspect of foot (generalized tenderness/ pain limited).         PT educated the patient on nerve pain and the importance of stretching/  strengthening ex. program.  L ankle remains pain limited with DF/ stretching.  Generalized L lateral/ dorsal foot and ankle tenderness with light palpation.  No change to HEP at this time but PT encouraged pt. to consider returning to [employer].         PT Long Term Goals - 07/20/17 0817      PT LONG TERM GOAL #1   Title  Pt. I with HEP to increase L ankle AROM to WNL as compared to R ankle to improve pain-free mobility.      Baseline  L/R ankle AROM:  DF: -21/6 deg., PF: 62/67 deg., IV: 30/46 deg., EV: -5/16 deg.  (pain limited with L ankle EV and DF)- pt. reports 12/10 L foot pain at worst with palpation/ DF.    Time  8    Period  Weeks    Status  New    Target Date  09/12/17      PT LONG TERM GOAL #2   Title  Pt. will increase LEFS to >40 out of 80 to improve pain-free mobility/ return to work.      Baseline  LEFS: 23 out of 80 on 11/14    Time  8    Period  Weeks    Status  New    Target Date  09/12/17  PT LONG TERM GOAL #3   Title  Pt. will increase B hip flexor/ quad. strengthening to 4+/5 MMT to improve standing tolerance/ walking with more normalized gait pattern.     Baseline  B LE muscle strength grossly 4/5 MMT (pain limited).      Time  8    Period  Weeks    Status  New    Target Date  09/12/17      PT LONG TERM GOAL #4   Title  Pt. will ambulate 10 min. with normalized gait pattern with no increase c/o knee/foot pain.    Baseline  Pt. c/o >6/10 R knee/back/L foot pain with walking.  L antalgic gait pattern     Time  8    Period  Weeks    Status  New    Target Date  09/12/17              Patient will benefit from skilled therapeutic intervention in order to improve the following deficits and impairments:     Visit Diagnosis: Pain in left foot  Chronic pain of right knee  Gait difficulty  Muscle weakness (generalized)     Problem List Patient Active Problem List   Diagnosis Date Noted  . Kidney stone 04/25/2017  . Ureteral stone     Cammie McgeeMichael C Hannie Shoe, PT, DPT # [id number] 08/24/2017, 3:11 PM  [place] [facility] [address] [place], [place], [address] Phone: [phone]   Fax:  [phone]  Name: Kelsey Bell MRN: [id number] Date of Birth: 09/06/1980

## 2017-08-29 ENCOUNTER — Ambulatory Visit: Payer: Worker's Compensation | Admitting: Physical Therapy

## 2017-08-29 DIAGNOSIS — M79672 Pain in left foot: Secondary | ICD-10-CM | POA: Diagnosis not present

## 2017-08-29 DIAGNOSIS — R269 Unspecified abnormalities of gait and mobility: Secondary | ICD-10-CM

## 2017-08-29 DIAGNOSIS — M6281 Muscle weakness (generalized): Secondary | ICD-10-CM

## 2017-08-29 DIAGNOSIS — G8929 Other chronic pain: Secondary | ICD-10-CM

## 2017-08-29 DIAGNOSIS — M25561 Pain in right knee: Secondary | ICD-10-CM

## 2017-08-30 ENCOUNTER — Encounter: Payer: Self-pay | Admitting: Physical Therapy

## 2017-08-30 NOTE — Addendum Note (Signed)
Addended by: Cammie McgeeSHERK, Jaye Polidori C on: 08/30/2017 07:29 AM   Modules accepted: Orders

## 2017-08-30 NOTE — Therapy (Signed)
[place] [facility] [address] [place], [place], [address] Phone: [phone]   Fax:  [phone]  Physical Therapy Treatment  Patient Details  Name: Kelsey MonsLauren B Jablonsky MRN: [id number] Date of Birth: 09/03/1981 Referring Provider: Dr. Ethelene Halamos   Encounter Date: 08/29/2017  PT End of Session - 08/30/17 0923    Visit Number  4    Number of Visits  8    Date for PT Re-Evaluation  09/25/17    PT Start Time  1643    PT Stop Time  1739    PT Time Calculation (min)  56 min    Activity Tolerance  Patient limited by pain    Behavior During Therapy  [facility] for tasks assessed/performed       Past Medical History:  Diagnosis Date  . Allergy   . Anxiety   . Arthritis   . Asthma   . Breast pain present for several months   Bil LT >RT across of breasts  . Depression   . Kidney stones   . LGSIL on Pap smear of cervix 2007  . Migraine   . Neuromuscular disorder (HCC)    Complex regional pain syndrome and Fibromyalgia    Past Surgical History:  Procedure Laterality Date  . COLPOSCOPY  2017   neg bx  . EXTRACORPOREAL SHOCK WAVE LITHOTRIPSY Right 04/26/2017   Procedure: EXTRACORPOREAL SHOCK WAVE LITHOTRIPSY (ESWL);  Surgeon: Vanna ScotlandBrandon, Ashley, MD;  Location: [facility] ORS;  Service: Urology;  Laterality: Right;  . KNEE ARTHROSCOPY Right   . TONSILLECTOMY      There were no vitals filed for this visit.  Subjective Assessment - 08/30/17 0903    Subjective  Pt. entered PT with c/o significant L foot pain (6/10)- generalized pain on top/lateral aspect.  Pt. states she had a few good days but increase L foot pain today.  Pt. reports she is still unable to tie sneaker on L foot because it is too sensitive.      Pertinent History  pt. known well to PT clinic.      Limitations  Standing;Walking;House hold activities;Other (comment)    Patient Stated Goals  Decrease knee/foot pain.  Improve walking    Currently in Pain?  Yes    Pain Score  6     Pain Location   Foot    Pain Orientation  Left    Pain Descriptors / Indicators  Burning;Throbbing;Tender    Pain Type  Chronic pain    Pain Onset  More than a month ago             Treatment:  There.ex.:  Tandem stance/ gait in //-bars with min. To no UE assist. Seated/ standing heel raises in //-bars (difficulty reported with standing due to pain).  Supine L ankle AROM/ alphabet/ isometrics all planes.  Scifit L4-5 10 min. (cuing to keep L heel on foot plate).  Reviewed HEP.   Manual tx.:  Supine L ankle AA/PROM all planes of movement with static holds.  Attempted contract-relax technique to increase DF but pt. Pain focused/limited.  L ankle subtalar mobs. Grade II 3x20 sec.  STM to L dorsal aspect of foot (tender/ pain limited)-  No swelling or edema noted.   PT Long Term Goals - 08/01/17 [id number]      PT LONG TERM GOAL #1   Title  Pt. I with HEP to increase L ankle AROM to WNL as compared to R ankle to improve pain-free mobility.  Baseline  L/R ankle AROM:  DF: -11/6 deg., PF: 62/67 deg., IV: 30/46 deg., EV: -5/16 deg.  (pain limited with L ankle EV and DF)- pt. reports 12/10 L foot pain at worst with palpation/ DF.    Time  8    Period  Weeks    Status  New    Target Date  09/25/17      PT LONG TERM GOAL #2   Title  Pt. will increase LEFS to >40 out of 80 to improve pain-free mobility/ return to work.      Baseline  LEFS: 21 out of 80 on 11/27    Time  8    Period  Weeks    Status  New    Target Date  09/25/17      PT LONG TERM GOAL #3   Title  Pt. will increase B hip flexor/ quad. strengthening to 4+/5 MMT to improve standing tolerance/ walking with more normalized gait pattern.     Baseline  B LE muscle strength grossly 4/5 MMT (pain limited).      Time  8    Period  Weeks    Status  New    Target Date  09/25/17      PT LONG TERM GOAL #4   Title  Pt. will ambulate 10 min. with normalized gait pattern with no increase c/o knee/foot pain.    Baseline  Pt. c/o >6/10 R  knee/back/L foot pain with walking.  L antalgic gait pattern     Time  8    Period  Weeks    Status  New    Target Date  09/25/17          Plan - 08/30/17 1191    Clinical Impression Statement  Pt. entered PT with moderate L antalgic gait pattern with limited heel strike/toe off.  Pt. remains pain focused with L LE wt. bearing activities/ manual tx. to L ankle/foot/toes.  Limited L ankle AROM (in supine): -14 to 52 deg.   A/PROM: EV (12/22 deg.), IV (27/35 deg.).  Pt. presents with greater L ankle DF during use of Scifit/ gait pattern.  Pt. able to ambulate with improved gait pattern/ toe off at end of PT tx. session.  Limited STM to L ankle/metatarsals secondary to pain/ fear of pain.      Clinical Presentation  Unstable    Clinical Decision Making  Moderate    Rehab Potential  Fair    PT Frequency  1x / week    PT Duration  8 weeks    PT Treatment/Interventions  ADLs/Self Care Home Management;Aquatic Therapy;Cryotherapy;Iontophoresis 4mg /ml Dexamethasone;Ultrasound;Patient/family education;Neuromuscular re-education;Balance training;Therapeutic exercise;Therapeutic activities;Functional mobility training;Stair training;Gait training;Manual techniques;Passive range of motion;Dry needling    PT Next Visit Plan  Progress HEP/ strengthening ex.     PT Home Exercise Plan  See handouts.      Consulted and Agree with Plan of Care  Patient       Patient will benefit from skilled therapeutic intervention in order to improve the following deficits and impairments:  Abnormal gait, Decreased strength, Increased muscle spasms, Postural dysfunction, Impaired perceived functional ability, Decreased activity tolerance, Decreased mobility, Impaired flexibility, Obesity, Decreased range of motion, Decreased balance, Pain, Decreased endurance  Visit Diagnosis: Pain in left foot  Chronic pain of right knee  Gait difficulty  Muscle weakness (generalized)     Problem List Patient Active Problem  List   Diagnosis Date Noted  . Kidney stone 04/25/2017  . Ureteral stone  Cammie McgeeMichael C Ingra Rother, PT, DPT # [phone] 08/30/2017, 9:27 AM  [place] [facility] [address] [place], [place], [address] Phone: [phone]   Fax:  [phone]  Name: Kelsey MonsLauren B Shuster MRN: [id number] Date of Birth: 06/22/1981

## 2017-09-05 ENCOUNTER — Encounter: Payer: Worker's Compensation | Admitting: Physical Therapy

## 2017-09-07 ENCOUNTER — Encounter: Payer: Self-pay | Admitting: Physical Therapy

## 2017-09-07 ENCOUNTER — Telehealth: Payer: Self-pay | Admitting: Pharmacist

## 2017-09-07 ENCOUNTER — Ambulatory Visit: Payer: Worker's Compensation | Attending: Physical Medicine and Rehabilitation | Admitting: Physical Therapy

## 2017-09-07 DIAGNOSIS — G8929 Other chronic pain: Secondary | ICD-10-CM

## 2017-09-07 DIAGNOSIS — R269 Unspecified abnormalities of gait and mobility: Secondary | ICD-10-CM | POA: Diagnosis present

## 2017-09-07 DIAGNOSIS — M6281 Muscle weakness (generalized): Secondary | ICD-10-CM | POA: Diagnosis present

## 2017-09-07 DIAGNOSIS — M25561 Pain in right knee: Secondary | ICD-10-CM | POA: Diagnosis present

## 2017-09-07 DIAGNOSIS — M79672 Pain in left foot: Secondary | ICD-10-CM | POA: Diagnosis not present

## 2017-09-07 NOTE — Therapy (Signed)
[place] [facility] [place] [facility] [address] [place], [place], [address] Phone: [phone]   Fax:  [phone]  Physical Therapy Treatment  Patient Details  Name: Kelsey Bell MRN: [id number] Date of Birth: 05/13/1981 Referring Provider: Dr. Ethelene Halamos   Encounter Date: 09/07/2017  PT End of Session - 09/07/17 1405    Visit Number  5    Number of Visits  8    Date for PT Re-Evaluation  09/25/17    PT Start Time  1355    PT Stop Time  1452    PT Time Calculation (min)  57 min    Activity Tolerance  Patient limited by pain    Behavior During Therapy  [facility] for tasks assessed/performed       Past Medical History:  Diagnosis Date  . Allergy   . Anxiety   . Arthritis   . Asthma   . Breast pain present for several months   Bil LT >RT across of breasts  . Depression   . Kidney stones   . LGSIL on Pap smear of cervix 2007  . Migraine   . Neuromuscular disorder (HCC)    Complex regional pain syndrome and Fibromyalgia    Past Surgical History:  Procedure Laterality Date  . COLPOSCOPY  2017   neg bx  . EXTRACORPOREAL SHOCK WAVE LITHOTRIPSY Right 04/26/2017   Procedure: EXTRACORPOREAL SHOCK WAVE LITHOTRIPSY (ESWL);  Surgeon: Vanna ScotlandBrandon, Ashley, MD;  Location: [facility]S;  Service: Urology;  Laterality: Right;  . KNEE ARTHROSCOPY Right   . TONSILLECTOMY      There were no vitals filed for this visit.  Subjective Assessment - 09/07/17 1403    Subjective  Pt. saw Dr. Ethelene Halamos and has new PT orders/ paperwork.  Pt. reports L foot is hurting a lot today.  R knee is hurting more today (no reason given).      Pertinent History  pt. known well to PT clinic.      Limitations  Standing;Walking;House hold activities;Other (comment)    Patient Stated Goals  Decrease knee/foot pain.  Improve walking    Currently in Pain?  Yes    Pain Score  7     Pain Location  Foot    Pain Orientation  Left    Pain Descriptors / Indicators  Burning;Aching    Pain Type  Chronic  pain      Pt. Hasn't returned to [employer] due to busy helping friend over past week.  PT recommends pt. Return to gym based ex. Soon to promote better carryover/ generalized strengthening/ muscle endurance.      Treatment:  There.ex.:  Standing marching/ heel raises 20x (light to no UE assist).  Prostretch 4x with hold (UE assist for balance).  TG knee flexion/ heel raises 20x each (increase c/o low back discomfort on TG machine).  Supine L ankle AROM/esometrics all planes (as tolerated/ difficulty with hand placement). Scifit L4-5 10 min. (cuing to keep L heel on foot plate).  BOSU step ups/ downs (forward)- increase knee pain with lateral step ups.  Reviewed HEP.  Manual tx.:  Supine L ankle AA/PROM all planes of movement with static holds.  L ankle subtalar mobs. Grade II 3x20 sec.  STM to L dorsal aspect of foot (tender/ pain limited)-  No swelling or edema noted.   PT Long Term Goals - 08/01/17 [id number]      PT LONG TERM GOAL #1   Title  Pt. I with HEP to increase L  ankle AROM to WNL as compared to R ankle to improve pain-free mobility.      Baseline  L/R ankle AROM:  DF: -11/6 deg., PF: 62/67 deg., IV: 30/46 deg., EV: -5/16 deg.  (pain limited with L ankle EV and DF)- pt. reports 12/10 L foot pain at worst with palpation/ DF.    Time  8    Period  Weeks    Status  New    Target Date  09/25/17      PT LONG TERM GOAL #2   Title  Pt. will increase LEFS to >40 out of 80 to improve pain-free mobility/ return to work.      Baseline  LEFS: 21 out of 80 on 11/27    Time  8    Period  Weeks    Status  New    Target Date  09/25/17      PT LONG TERM GOAL #3   Title  Pt. will increase B hip flexor/ quad. strengthening to 4+/5 MMT to improve standing tolerance/ walking with more normalized gait pattern.     Baseline  B LE muscle strength grossly 4/5 MMT (pain limited).      Time  8    Period  Weeks    Status  New    Target Date  09/25/17      PT LONG TERM GOAL #4   Title  Pt.  will ambulate 10 min. with normalized gait pattern with no increase c/o knee/foot pain.    Baseline  Pt. c/o >6/10 R knee/back/L foot pain with walking.  L antalgic gait pattern     Time  8    Period  Weeks    Status  New    Target Date  09/25/17            Plan - 09/07/17 1405    Clinical Impression Statement  Marked improvement in L heel strike/ mid-stance/ toe off in //-bars with light UE assist.  Pt. continues to report that she is unable to tie L shoe due to pain/ tenderness.  No increase swelling or pitting edema noted in L ankle as compared to R.  Pt. reports increase pain with manual resistance/ isometrics in supine position.  PT encouraged pt. to remain compliant with HEP and massage to L foot to desensitize nerve endings/ decrease pain.      Clinical Presentation  Unstable    Clinical Decision Making  Moderate    Rehab Potential  Fair    PT Frequency  1x / week    PT Duration  8 weeks    PT Treatment/Interventions  ADLs/Self Care Home Management;Aquatic Therapy;Cryotherapy;Iontophoresis 4mg /ml Dexamethasone;Ultrasound;Patient/family education;Neuromuscular re-education;Balance training;Therapeutic exercise;Therapeutic activities;Functional mobility training;Stair training;Gait training;Manual techniques;Passive range of motion;Dry needling    PT Next Visit Plan  Progress HEP/ strengthening ex.     PT Home Exercise Plan  See handouts.      Consulted and Agree with Plan of Care  Patient       Patient will benefit from skilled therapeutic intervention in order to improve the following deficits and impairments:  Abnormal gait, Decreased strength, Increased muscle spasms, Postural dysfunction, Impaired perceived functional ability, Decreased activity tolerance, Decreased mobility, Impaired flexibility, Obesity, Decreased range of motion, Decreased balance, Pain, Decreased endurance  Visit Diagnosis: Pain in left foot  Chronic pain of right knee  Gait difficulty  Muscle  weakness (generalized)     Problem List Patient Active Problem List   Diagnosis Date Noted  . Kidney  stone 04/25/2017  . Ureteral stone    Cammie Mcgee, PT, DPT # [id number] 09/07/2017, 8:21 PM  [place] [facility] [address] [place], [place], [address] Phone: [phone]   Fax:  [phone]  Name: Kelsey Bell MRN: [id number] Date of Birth: 03/19/1981

## 2017-09-07 NOTE — Telephone Encounter (Signed)
09/07/17 Faxed scripts to [facility] for refills-Ventolin HFA Inhale 2 puffs into the lungs every 6 hours as needed for wheezing & Advair 250/50 Inhale 1 puff into the lungs every 12 hours, rinse mouth and spit after each use.[name]

## 2017-09-12 ENCOUNTER — Encounter: Payer: Self-pay | Admitting: Physical Therapy

## 2017-09-12 ENCOUNTER — Ambulatory Visit: Payer: Worker's Compensation | Admitting: Physical Therapy

## 2017-09-12 DIAGNOSIS — R269 Unspecified abnormalities of gait and mobility: Secondary | ICD-10-CM

## 2017-09-12 DIAGNOSIS — G8929 Other chronic pain: Secondary | ICD-10-CM

## 2017-09-12 DIAGNOSIS — M25561 Pain in right knee: Secondary | ICD-10-CM

## 2017-09-12 DIAGNOSIS — M79672 Pain in left foot: Secondary | ICD-10-CM | POA: Diagnosis not present

## 2017-09-12 DIAGNOSIS — M6281 Muscle weakness (generalized): Secondary | ICD-10-CM

## 2017-09-15 NOTE — Therapy (Signed)
[facility] [address] [place], [place], [address] Phone: [phone]   Fax:  [phone]  Physical Therapy Treatment  Patient Details  Name: Kelsey Bell MRN: [id number] Date of Birth: 1981-05-01 Referring Provider: Dr. Ethelene Hal   Encounter Date: 09/12/2017  PT End of Session - 09/15/17 1214    Visit Number  6    Number of Visits  8    Date for PT Re-Evaluation  09/25/17    PT Start Time  1416    PT Stop Time  1519    PT Time Calculation (min)  63 min    Activity Tolerance  Patient limited by pain    Behavior During Therapy  [facility] for tasks assessed/performed       Past Medical History:  Diagnosis Date  . Allergy   . Anxiety   . Arthritis   . Asthma   . Breast pain present for several months   Bil LT >RT across of breasts  . Depression   . Kidney stones   . LGSIL on Pap smear of cervix 2007  . Migraine   . Neuromuscular disorder (HCC)    Complex regional pain syndrome and Fibromyalgia    Past Surgical History:  Procedure Laterality Date  . COLPOSCOPY  2017   neg bx  . EXTRACORPOREAL SHOCK WAVE LITHOTRIPSY Right 04/26/2017   Procedure: EXTRACORPOREAL SHOCK WAVE LITHOTRIPSY (ESWL);  Surgeon: Vanna Scotland, MD;  Location: [facility] ORS;  Service: Urology;  Laterality: Right;  . KNEE ARTHROSCOPY Right   . TONSILLECTOMY      There were no vitals filed for this visit.    Pt. having a lot of pain in L foot today.  Pt. entered PT clinic with moderate L antalgic gait pattern with shoe untied.       Treatment:  There.ex.:Standing marching/ heel raises 20x (light to no UE assist).  Prostretch 4x with hold (UE assist for balance). Supine L ankle AROM/esometrics all planes (as tolerated/ difficulty with hand placement). Scifit L4-5 10 min. (cuing to keep L heel on foot plate).  BOSU step ups/ downs (forward)- increase knee pain with lateral step ups.  Airex forward/backwards wt. Shifting with light UE assist.  Airex step  ups/overs (forward/ lateral).    Manual tx.: Supine L ankle AA/PROM all planes of movement with static holds. L ankle subtalar mobs. Grade II 3x20 sec. STM to L dorsal aspect of foot (tender/ pain limited)- No swelling or edema noted.   Discussed use of pts. Personal TENS unit.      PT Long Term Goals - 08/01/17 6962      PT LONG TERM GOAL #1   Title  Pt. I with HEP to increase L ankle AROM to WNL as compared to R ankle to improve pain-free mobility.      Baseline  L/R ankle AROM:  DF: -11/6 deg., PF: 62/67 deg., IV: 30/46 deg., EV: -5/16 deg.  (pain limited with L ankle EV and DF)- pt. reports 12/10 L foot pain at worst with palpation/ DF.    Time  8    Period  Weeks    Status  New    Target Date  09/25/17      PT LONG TERM GOAL #2   Title  Pt. will increase LEFS to >40 out of 80 to improve pain-free mobility/ return to work.      Baseline  LEFS: 21 out of 80 on 11/27    Time  8  Period  Weeks    Status  New    Target Date  09/25/17      PT LONG TERM GOAL #3   Title  Pt. will increase B hip flexor/ quad. strengthening to 4+/5 MMT to improve standing tolerance/ walking with more normalized gait pattern.     Baseline  B LE muscle strength grossly 4/5 MMT (pain limited).      Time  8    Period  Weeks    Status  New    Target Date  09/25/17      PT LONG TERM GOAL #4   Title  Pt. will ambulate 10 min. with normalized gait pattern with no increase c/o knee/foot pain.    Baseline  Pt. c/o >6/10 R knee/back/L foot pain with walking.  L antalgic gait pattern     Time  8    Period  Weeks    Status  New    Target Date  09/25/17          Plan - 09/15/17 1215    Clinical Impression Statement  Pt. very pain focused during first half of PT tx. session.  Increase pain with light STM to L foot/lateral ankle.  Pt. has difficulty with passive toe extension in supine position.  No swelling noted in L foot and good dorsal pedal pulse.  PT encouraged pt. to avoid ankle splint and  boot and work on progressing through pain in safe environment.      Clinical Presentation  Unstable    Clinical Decision Making  High    Rehab Potential  Fair    PT Frequency  1x / week    PT Duration  8 weeks    PT Treatment/Interventions  ADLs/Self Care Home Management;Aquatic Therapy;Cryotherapy;Iontophoresis 4mg /ml Dexamethasone;Ultrasound;Patient/family education;Neuromuscular re-education;Balance training;Therapeutic exercise;Therapeutic activities;Functional mobility training;Stair training;Gait training;Manual techniques;Passive range of motion;Dry needling    PT Next Visit Plan  Progress HEP/ strengthening ex.   CHECK GOALS NEXT TX SESSION    PT Home Exercise Plan  See handouts.      Consulted and Agree with Plan of Care  Patient       Patient will benefit from skilled therapeutic intervention in order to improve the following deficits and impairments:  Abnormal gait, Decreased strength, Increased muscle spasms, Postural dysfunction, Impaired perceived functional ability, Decreased activity tolerance, Decreased mobility, Impaired flexibility, Obesity, Decreased range of motion, Decreased balance, Pain, Decreased endurance  Visit Diagnosis: Pain in left foot  Chronic pain of right knee  Gait difficulty  Muscle weakness (generalized)     Problem List Patient Active Problem List   Diagnosis Date Noted  . Kidney stone 04/25/2017  . Ureteral stone    Cammie McgeeMichael C Sherk, PT, DPT # [id number] 09/15/2017, 12:19 PM  [facility] [address] [place], [place], [address] Phone: [phone]   Fax:  [phone]  Name: Kelsey Bell MRN: [id number] Date of Birth: 01/15/1981

## 2017-09-20 ENCOUNTER — Ambulatory Visit: Payer: Worker's Compensation | Admitting: Physical Therapy

## 2017-09-26 ENCOUNTER — Ambulatory Visit: Payer: Worker's Compensation

## 2017-09-26 DIAGNOSIS — M79672 Pain in left foot: Secondary | ICD-10-CM | POA: Diagnosis not present

## 2017-09-26 DIAGNOSIS — R269 Unspecified abnormalities of gait and mobility: Secondary | ICD-10-CM

## 2017-09-26 DIAGNOSIS — M6281 Muscle weakness (generalized): Secondary | ICD-10-CM

## 2017-09-26 DIAGNOSIS — M25561 Pain in right knee: Secondary | ICD-10-CM

## 2017-09-26 DIAGNOSIS — G8929 Other chronic pain: Secondary | ICD-10-CM

## 2017-09-26 NOTE — Therapy (Signed)
[facility] [address] [place], [place], [address] Phone: [phone]   Fax:  [phone]  Physical Therapy Treatment  Patient Details  Name: Kelsey MonsLauren B Leinbach MRN: [id number] Date of Birth: 04/09/1981 Referring Provider: Dr. Ethelene Halamos   Encounter Date: 09/26/2017  PT End of Session - 09/26/17 1527    Visit Number  7    Number of Visits  14    Date for PT Re-Evaluation  11/07/17    PT Start Time  1530    PT Stop Time  1600    PT Time Calculation (min)  30 min    Activity Tolerance  Patient limited by pain    Behavior During Therapy  [facility] for [facility] assessed/performed       Past Medical History:  Diagnosis Date  . Allergy   . Anxiety   . Arthritis   . Asthma   . Breast pain present for several months   Bil LT >RT across of breasts  . Depression   . Kidney stones   . LGSIL on Pap smear of cervix 2007  . Migraine   . Neuromuscular disorder (HCC)    Complex regional pain syndrome and Fibromyalgia    Past Surgical History:  Procedure Laterality Date  . COLPOSCOPY  2017   neg bx  . EXTRACORPOREAL SHOCK WAVE LITHOTRIPSY Right 04/26/2017   Procedure: EXTRACORPOREAL SHOCK WAVE LITHOTRIPSY (ESWL);  Surgeon: Vanna ScotlandBrandon, Ashley, MD;  Location: [facility] ORS;  Service: Urology;  Laterality: Right;  . KNEE ARTHROSCOPY Right   . TONSILLECTOMY      There were no vitals filed for this visit.  Subjective Assessment - 09/26/17 1526    Subjective  Pt reports persistent L foot pain upon arrival. She has no specific questions or concerns. Has not been to clinic for therapy since 09/12/17.     Pertinent History  pt. known well to PT clinic.      Limitations  Standing;Walking;House hold activities;Other (comment)    Patient Stated Goals  Decrease knee/foot pain.  Improve walking    Currently in Pain?  Yes    Pain Score  6     Pain Location  Foot    Pain Orientation  Left    Pain Descriptors / Indicators  Burning;Stabbing    Pain Type  Chronic pain     Pain Onset  More than a month ago    Pain Frequency  Constant        [facility] PT Assessment - 09/26/17 1600      Observation/Other Assessments   Other Surveys   Other Surveys    Lower Extremity Functional Scale   36/80           There were no vitals filed for this visit.    Treatment:  There.ex.: Pt completed LEFS (unbilled); L ankle AROM measures obtained:  DF: -5/6 deg., PF: 62/67 deg., IV: 30/46 deg., EV: -5/16 deg.  (pain limited with L ankle EV and DF). Pt. reports 10+/10 L foot pain at worst Hip strength testing: 4/5 bilateral hip flexion, 4+/5 bilateral quads; Updated goals and discussed plan of care with patient; Supine L ankle AROM/isometrics all planes(as tolerated/ difficulty with hand placement).  Standing marching/ heel raises 20x(light to no UE assist).  SciFit L6 x 10 minutes (unbilled);                  PT Education - 09/26/17 1527    Education provided  Yes    Education Details  Plan of care and goals    Person(s) Educated  Patient    Methods  Explanation    Comprehension  Verbalized understanding          PT Long Term Goals - 09/26/17 1529      PT LONG TERM GOAL #1   Title  Pt. I with HEP to increase L ankle AROM to WNL as compared to R ankle to improve pain-free mobility.      Baseline  L/R ankle AROM:  DF: -5/6 deg., PF: 55/67 deg., IV: 29/46 deg., EV: -19/16 deg.  (pain limited with L ankle DF and inversion), pt. reports 10+/10 L foot pain at worst with palpation/ DF;    09/26/16 L/R ankle AROM:  DF: -5/6 deg., PF: 62/67 deg., IV: 30/46 deg., EV: -5/16 deg.  (pain limited with L ankle EV and DF)- pt. reports 10+/10 L foot pain at worst    Time  8    Period  Weeks    Status  On-going    Target Date  11/07/17      PT LONG TERM GOAL #2   Title  Pt. will increase LEFS to >40 out of 80 to improve pain-free mobility/ return to work.      Baseline  LEFS: 21 out of 80 on 11/27; 09/26/17: 36/80    Time  8    Period  Weeks     Status  On-going    Target Date  11/07/17      PT LONG TERM GOAL #3   Title  Pt. will increase B hip flexor/ quad. strengthening to 4+/5 MMT to improve standing tolerance/ walking with more normalized gait pattern.     Baseline  B LE muscle strength grossly 4/5 MMT (pain limited). 09/26/17: hip flexor 4/5, quad 4+/5     Time  8    Period  Weeks    Status  On-going    Target Date  11/07/17      PT LONG TERM GOAL #4   Title  Pt. will ambulate 10 min. with normalized gait pattern with no increase c/o knee/foot pain.    Baseline  Pt. c/o >6/10 R knee/back/L foot pain with walking.  L antalgic gait pattern; 09/26/17: unchanged    Time  8    Period  Weeks    Status  On-going    Target Date  11/07/17            Plan - 09/26/17 1529    Clinical Impression Statement  Pt arrived late for her appointment and took an extended time to come to the gym and fill out her LEFS questionnaire so session adjusted accordingly. She has not been in for therapy since 09/12/17. Readdressed goals on this date. Patients AROM of her L ankle has improved slightly since the initial evaluation, most notably in dorsiflexion. Her LEFS improved from 21/80 at initial evaluation to 36/80 today. She continues to report that pain reaches 10/10 at its worse. She continues to have significant pain with all walking. Bilateral hip flexors remain weak but quad strength is improved due to no limitations related to pain today. Pt encouraged to continue HEP. She will benefit from continued skilled PT services to address deficits in L ankle pain/mobility in order to return to full function at home and work.     Clinical Presentation  Unstable    Rehab Potential  Fair    PT Frequency  1x / week    PT Duration  6 weeks  PT Treatment/Interventions  ADLs/Self Care Home Management;Aquatic Therapy;Cryotherapy;Iontophoresis 4mg /ml Dexamethasone;Ultrasound;Patient/family education;Neuromuscular re-education;Balance training;Therapeutic  exercise;Therapeutic activities;Functional mobility training;Stair training;Gait training;Manual techniques;Passive range of motion;Dry needling    PT Next Visit Plan  Progress HEP/ strengthening ex.      PT Home Exercise Plan  See handouts.      Consulted and Agree with Plan of Care  Patient       Patient will benefit from skilled therapeutic intervention in order to improve the following deficits and impairments:  Abnormal gait, Decreased strength, Increased muscle spasms, Postural dysfunction, Impaired perceived functional ability, Decreased activity tolerance, Decreased mobility, Impaired flexibility, Obesity, Decreased range of motion, Decreased balance, Pain, Decreased endurance  Visit Diagnosis: Pain in left foot - Plan: PT plan of care cert/re-cert  Chronic pain of right knee - Plan: PT plan of care cert/re-cert  Gait difficulty - Plan: PT plan of care cert/re-cert  Muscle weakness (generalized) - Plan: PT plan of care cert/re-cert     Problem List Patient Active Problem List   Diagnosis Date Noted  . Kidney stone 04/25/2017  . Ureteral stone    Lynnea Maizes PT, DPT   Braydyn Schultes 09/26/2017, 4:59 PM  [place] [facility] [address] [place], [place], [address] Phone: [phone]   Fax:  [phone]  Name: GLENDELL SCHLOTTMAN MRN: [id number] Date of Birth: 1980/09/22

## 2017-09-28 ENCOUNTER — Telehealth: Payer: Self-pay | Admitting: Pharmacist

## 2017-09-28 NOTE — Telephone Encounter (Signed)
09/28/17 Printed [employer] refill request for Prozac 40mg  Take 2 capsules (80mg ) by mouth daily at bedtime, will take refill request to Dr. Rogers BlockerAhluwalia to sign.[name]

## 2017-10-03 ENCOUNTER — Ambulatory Visit: Payer: Worker's Compensation | Admitting: Physical Therapy

## 2017-10-03 DIAGNOSIS — R269 Unspecified abnormalities of gait and mobility: Secondary | ICD-10-CM

## 2017-10-03 DIAGNOSIS — M25561 Pain in right knee: Secondary | ICD-10-CM

## 2017-10-03 DIAGNOSIS — M79672 Pain in left foot: Secondary | ICD-10-CM

## 2017-10-03 DIAGNOSIS — M6281 Muscle weakness (generalized): Secondary | ICD-10-CM

## 2017-10-03 DIAGNOSIS — G8929 Other chronic pain: Secondary | ICD-10-CM

## 2017-10-04 ENCOUNTER — Encounter: Payer: Self-pay | Admitting: Physical Therapy

## 2017-10-04 NOTE — Therapy (Signed)
[facility] [address] [place], [place], [address] Phone: [phone]   Fax:  [phone]  Physical Therapy Treatment  Patient Details  Name: Kelsey Bell MRN: [id number] Date of Birth: 04-Sep-1981 Referring Provider: Dr. Ethelene Hal   Encounter Date: 10/03/2017  PT End of Session - 10/04/17 1327    Visit Number  8    Number of Visits  14    Date for PT Re-Evaluation  11/07/17    PT Start Time  1526    PT Stop Time  1631    PT Time Calculation (min)  65 min    Activity Tolerance  Patient limited by pain;Patient tolerated treatment well    Behavior During Therapy  [facility] for tasks assessed/performed       Past Medical History:  Diagnosis Date  . Allergy   . Anxiety   . Arthritis   . Asthma   . Breast pain present for several months   Bil LT >RT across of breasts  . Depression   . Kidney stones   . LGSIL on Pap smear of cervix 2007  . Migraine   . Neuromuscular disorder (HCC)    Complex regional pain syndrome and Fibromyalgia    Past Surgical History:  Procedure Laterality Date  . COLPOSCOPY  2017   neg bx  . EXTRACORPOREAL SHOCK WAVE LITHOTRIPSY Right 04/26/2017   Procedure: EXTRACORPOREAL SHOCK WAVE LITHOTRIPSY (ESWL);  Surgeon: Vanna Scotland, MD;  Location: [facility] ORS;  Service: Urology;  Laterality: Right;  . KNEE ARTHROSCOPY Right   . TONSILLECTOMY      There were no vitals filed for this visit.  Subjective Assessment - 10/04/17 1307    Subjective  Pt. had a f/u visit with Dr. Ethelene Hal and entered PT with new paperwork (diagnosis of CRPS of L foot).      Pertinent History  pt. known well to PT clinic.      Limitations  Standing;Walking;House hold activities;Other (comment)    Patient Stated Goals  Decrease knee/foot pain.  Improve walking    Currently in Pain?  Yes    Pain Score  5     Pain Location  Foot    Pain Orientation  Left    Pain Descriptors / Indicators  Stabbing;Burning         There.ex.:Standing  marching/ heel raises 20x(light to no UE assist). Seated/supine L ankle AROM/esometrics all planes.  Scifit L5 10 min. (cuing to keep L heel on foot plate).Airex (square/beam) forward/backwards/lateral wt. Shifting with light UE assist. Airex step ups/overs (forward/ lateral) 10x.  Attempted partial lunges with good ankle support but knee limited by pain with flexion (modified in //-bars).  Discussed Artist ex. Program.  Encouraged pt. To participate 2x/week.      Manual tx.: Supine L ankle AA/PROM all planes of movement with static holds. L ankle subtalar mobs. Grade II 3x20 sec. STM to L dorsal aspect of foot (tender/ pain limited)-   No swelling or redness noted.    PT Long Term Goals - 09/26/17 1529      PT LONG TERM GOAL #1   Title  Pt. I with HEP to increase L ankle AROM to WNL as compared to R ankle to improve pain-free mobility.      Baseline  L/R ankle AROM:  DF: -5/6 deg., PF: 55/67 deg., IV: 29/46 deg., EV: -19/16 deg.  (pain limited with L ankle DF and inversion), pt. reports 10+/10 L foot pain at worst with  palpation/ DF;    09/26/16 L/R ankle AROM:  DF: -5/6 deg., PF: 62/67 deg., IV: 30/46 deg., EV: -5/16 deg.  (pain limited with L ankle EV and DF)- pt. reports 10+/10 L foot pain at worst    Time  8    Period  Weeks    Status  On-going    Target Date  11/07/17      PT LONG TERM GOAL #2   Title  Pt. will increase LEFS to >40 out of 80 to improve pain-free mobility/ return to work.      Baseline  LEFS: 21 out of 80 on 11/27; 09/26/17: 36/80    Time  8    Period  Weeks    Status  On-going    Target Date  11/07/17      PT LONG TERM GOAL #3   Title  Pt. will increase B hip flexor/ quad. strengthening to 4+/5 MMT to improve standing tolerance/ walking with more normalized gait pattern.     Baseline  B LE muscle strength grossly 4/5 MMT (pain limited). 09/26/17: hip flexor 4/5, quad 4+/5     Time  8    Period  Weeks    Status  On-going    Target Date  11/07/17       PT LONG TERM GOAL #4   Title  Pt. will ambulate 10 min. with normalized gait pattern with no increase c/o knee/foot pain.    Baseline  Pt. c/o >6/10 R knee/back/L foot pain with walking.  L antalgic gait pattern; 09/26/17: unchanged    Time  8    Period  Weeks    Status  On-going    Target Date  11/07/17         Plan - 10/04/17 1328    Clinical Impression Statement  Marked increase in L DF AAROM after manual stretches to 6 deg.  Pt. continues to report increase L foot pain/ sensitivity with light touch by hand or even tied shoe.  No redness or swelling noted in L foot or lower leg.  Pt. progressing well with higher level ankle stability/ balance tasks with Airex beam/ stability ex.  L/R knee pain and crepitus limits lunge position and squats.      Clinical Presentation  Unstable    Clinical Decision Making  High    Rehab Potential  Fair    PT Frequency  1x / week    PT Duration  6 weeks    PT Treatment/Interventions  ADLs/Self Care Home Management;Aquatic Therapy;Cryotherapy;Iontophoresis 4mg /ml Dexamethasone;Ultrasound;Patient/family education;Neuromuscular re-education;Balance training;Therapeutic exercise;Therapeutic activities;Functional mobility training;Stair training;Gait training;Manual techniques;Passive range of motion;Dry needling    PT Next Visit Plan  Progress HEP/ strengthening ex.      PT Home Exercise Plan  See handouts.      Consulted and Agree with Plan of Care  Patient       Patient will benefit from skilled therapeutic intervention in order to improve the following deficits and impairments:  Abnormal gait, Decreased strength, Increased muscle spasms, Postural dysfunction, Impaired perceived functional ability, Decreased activity tolerance, Decreased mobility, Impaired flexibility, Obesity, Decreased range of motion, Decreased balance, Pain, Decreased endurance  Visit Diagnosis: Pain in left foot  Chronic pain of right knee  Gait difficulty  Muscle weakness  (generalized)     Problem List Patient Active Problem List   Diagnosis Date Noted  . Kidney stone 04/25/2017  . Ureteral stone    Cammie Mcgee, PT, DPT # [phone] 10/04/2017, 2:02 PM  [facility] [address] [place], [place], [address] Phone: [phone]   Fax:  [phone]  Name: Kelsey Bell MRN: [id number] Date of Birth: 03/12/1981

## 2017-10-10 ENCOUNTER — Ambulatory Visit: Payer: Worker's Compensation | Attending: Physical Medicine and Rehabilitation | Admitting: Physical Therapy

## 2017-10-10 DIAGNOSIS — R269 Unspecified abnormalities of gait and mobility: Secondary | ICD-10-CM | POA: Diagnosis present

## 2017-10-10 DIAGNOSIS — M25561 Pain in right knee: Secondary | ICD-10-CM | POA: Diagnosis present

## 2017-10-10 DIAGNOSIS — G8929 Other chronic pain: Secondary | ICD-10-CM | POA: Diagnosis present

## 2017-10-10 DIAGNOSIS — M79672 Pain in left foot: Secondary | ICD-10-CM

## 2017-10-10 DIAGNOSIS — M6281 Muscle weakness (generalized): Secondary | ICD-10-CM | POA: Insufficient documentation

## 2017-10-17 ENCOUNTER — Ambulatory Visit
Payer: No Typology Code available for payment source | Attending: Physical Medicine and Rehabilitation | Admitting: Physical Therapy

## 2017-10-17 DIAGNOSIS — M79672 Pain in left foot: Secondary | ICD-10-CM

## 2017-10-17 DIAGNOSIS — G8929 Other chronic pain: Secondary | ICD-10-CM

## 2017-10-17 DIAGNOSIS — M25561 Pain in right knee: Secondary | ICD-10-CM | POA: Insufficient documentation

## 2017-10-17 DIAGNOSIS — R269 Unspecified abnormalities of gait and mobility: Secondary | ICD-10-CM

## 2017-10-17 DIAGNOSIS — M6281 Muscle weakness (generalized): Secondary | ICD-10-CM

## 2017-10-18 NOTE — Therapy (Addendum)
[facility] [address] [place], [place], [address] Phone: [phone]   Fax:  [phone]  Physical Therapy Treatment  Patient Details  Name: Kelsey Bell MRN: [id number] Date of Birth: Oct 02, 1980 Referring Provider: Dr. Ethelene Hal   Encounter Date: 10/10/2017  PT End of Session - 10/18/17 1736    Visit Number  9    Number of Visits  14    Date for PT Re-Evaluation  11/07/17    PT Start Time  1514    PT Stop Time  1614    PT Time Calculation (min)  60 min    Activity Tolerance  Patient limited by pain;Patient tolerated treatment well    Behavior During Therapy  [facility] for tasks assessed/performed       Past Medical History:  Diagnosis Date  . Allergy   . Anxiety   . Arthritis   . Asthma   . Breast pain present for several months   Bil LT >RT across of breasts  . Depression   . Kidney stones   . LGSIL on Pap smear of cervix 2007  . Migraine   . Neuromuscular disorder (HCC)    Complex regional pain syndrome and Fibromyalgia    Past Surgical History:  Procedure Laterality Date  . COLPOSCOPY  2017   neg bx  . EXTRACORPOREAL SHOCK WAVE LITHOTRIPSY Right 04/26/2017   Procedure: EXTRACORPOREAL SHOCK WAVE LITHOTRIPSY (ESWL);  Surgeon: Vanna Scotland, MD;  Location: [facility] ORS;  Service: Urology;  Laterality: Right;  . KNEE ARTHROSCOPY Right   . TONSILLECTOMY      There were no vitals filed for this visit.  Subjective Assessment - 10/18/17 1735    Pertinent History  pt. known well to PT clinic.      Limitations  Standing;Walking;House hold activities;Other (comment)    Patient Stated Goals  Decrease knee/foot pain.  Improve walking    Currently in Pain?  Yes    Pain Score  5     Pain Location  Foot    Pain Orientation  Left    Pain Descriptors / Indicators  Aching;Burning    Pain Type  Chronic pain    Pain Onset  More than a month ago    Pain Frequency  Constant         Pt. applied pain patches to L foot  (top/bottom) prior to tx. session. Pt. reports 5/10 L foot pain prior to tx. and limping into PT with shoe untied.     Treatment:  There.ex.:Seated/supine L ankle AROM/esometrics all planes.   Scifit L6 10 min. (improved L foot/heel position). Airex (square/beam) forward/backwards/lateral wt. Shifting with light UE assist. Airex step ups/overs (forward/ lateral) 10x.   TG knee flexion 30x (no increase c/o knee pain).  Heel/toe raises 20x. Standing BOSU step ups/ wt. Shifting.     Manual tx.: Supine L ankle AA/PROM all planes of movement with static holds. L ankle subtalar mobs. Grade II 3x20 sec. STM to L dorsal aspect of foot (tender/ pain limited).  Desensitization technique.         PT is challenging pts. L ankle/LE muscle stability with BOSU/Airex tasks in //-bars for safety. Few rest breaks required but remains pain limited with hand placement on L foot due to sensitivity/ tenderness/ pain.      PT Long Term Goals - 09/26/17 1529      PT LONG TERM GOAL #1   Title  Pt. I with HEP to increase L ankle AROM  to WNL as compared to R ankle to improve pain-free mobility.      Baseline  L/R ankle AROM:  DF: -5/6 deg., PF: 55/67 deg., IV: 29/46 deg., EV: -19/16 deg.  (pain limited with L ankle DF and inversion), pt. reports 10+/10 L foot pain at worst with palpation/ DF;    09/26/16 L/R ankle AROM:  DF: -5/6 deg., PF: 62/67 deg., IV: 30/46 deg., EV: -5/16 deg.  (pain limited with L ankle EV and DF)- pt. reports 10+/10 L foot pain at worst    Time  8    Period  Weeks    Status  On-going    Target Date  11/07/17      PT LONG TERM GOAL #2   Title  Pt. will increase LEFS to >40 out of 80 to improve pain-free mobility/ return to work.      Baseline  LEFS: 21 out of 80 on 11/27; 09/26/17: 36/80    Time  8    Period  Weeks    Status  On-going    Target Date  11/07/17      PT LONG TERM GOAL #3   Title  Pt. will increase B hip flexor/ quad. strengthening to 4+/5 MMT to improve  standing tolerance/ walking with more normalized gait pattern.     Baseline  B LE muscle strength grossly 4/5 MMT (pain limited). 09/26/17: hip flexor 4/5, quad 4+/5     Time  8    Period  Weeks    Status  On-going    Target Date  11/07/17      PT LONG TERM GOAL #4   Title  Pt. will ambulate 10 min. with normalized gait pattern with no increase c/o knee/foot pain.    Baseline  Pt. c/o >6/10 R knee/back/L foot pain with walking.  L antalgic gait pattern; 09/26/17: unchanged    Time  8    Period  Weeks    Status  On-going    Target Date  11/07/17            Plan - 10/18/17 1736    Clinical Presentation  Unstable    Clinical Decision Making  High    Rehab Potential  Fair    PT Frequency  1x / week    PT Duration  6 weeks    PT Treatment/Interventions  ADLs/Self Care Home Management;Aquatic Therapy;Cryotherapy;Iontophoresis 4mg /ml Dexamethasone;Ultrasound;Patient/family education;Neuromuscular re-education;Balance training;Therapeutic exercise;Therapeutic activities;Functional mobility training;Stair training;Gait training;Manual techniques;Passive range of motion;Dry needling    PT Next Visit Plan  Progress HEP/ strengthening ex.      PT Home Exercise Plan  See handouts.         Patient will benefit from skilled therapeutic intervention in order to improve the following deficits and impairments:  Abnormal gait, Decreased strength, Increased muscle spasms, Postural dysfunction, Impaired perceived functional ability, Decreased activity tolerance, Decreased mobility, Impaired flexibility, Obesity, Decreased range of motion, Decreased balance, Pain, Decreased endurance  Visit Diagnosis: Pain in left foot  Chronic pain of right knee  Gait difficulty  Muscle weakness (generalized)     Problem List Patient Active Problem List   Diagnosis Date Noted  . Kidney stone 04/25/2017  . Ureteral stone    Cammie McgeeMichael C Sherk, PT, DPT # [phone] 10/18/2017, 5:37 PM  [place] [facility] [address] [place], [place], [address] Phone: [phone]   Fax:  [phone]  Name: Kelsey Bell MRN: [id number] Date of Birth: 05/09/1981

## 2017-10-19 ENCOUNTER — Encounter: Payer: Self-pay | Admitting: Physical Therapy

## 2017-10-19 ENCOUNTER — Telehealth: Payer: Self-pay | Admitting: Pharmacist

## 2017-10-19 NOTE — Therapy (Addendum)
[place] [facility] [address] [place], [place], [address] Phone: [phone]   Fax:  [phone]  Physical Therapy Treatment  Patient Details  Name: Kelsey Bell MRN: [id number] Date of Birth: Feb 07, 1981 Referring Provider: Dr. Ethelene Hal   Encounter Date: 10/17/2017  PT End of Session - 10/19/17 0958    Visit Number  10    Number of Visits  14    Date for PT Re-Evaluation  11/07/17    PT Start Time  1427    PT Stop Time  1529    PT Time Calculation (min)  62 min    Activity Tolerance  Patient limited by pain;Patient tolerated treatment well    Behavior During Therapy  [facility] for tasks assessed/performed       Past Medical History:  Diagnosis Date  . Allergy   . Anxiety   . Arthritis   . Asthma   . Breast pain present for several months   Bil LT >RT across of breasts  . Depression   . Kidney stones   . LGSIL on Pap smear of cervix 2007  . Migraine   . Neuromuscular disorder (HCC)    Complex regional pain syndrome and Fibromyalgia    Past Surgical History:  Procedure Laterality Date  . COLPOSCOPY  2017   neg bx  . EXTRACORPOREAL SHOCK WAVE LITHOTRIPSY Right 04/26/2017   Procedure: EXTRACORPOREAL SHOCK WAVE LITHOTRIPSY (ESWL);  Surgeon: Vanna Scotland, MD;  Location: [facility] ORS;  Service: Urology;  Laterality: Right;  . KNEE ARTHROSCOPY Right   . TONSILLECTOMY      There were no vitals filed for this visit.  Subjective Assessment - 10/19/17 0957    Pertinent History  pt. known well to PT clinic.      Limitations  Standing;Walking;House hold activities;Other (comment)    Patient Stated Goals  Decrease knee/foot pain.  Improve walking    Currently in Pain?  Yes    Pain Score  6     Pain Location  Foot    Pain Orientation  Left    Pain Descriptors / Indicators  Aching;Burning    Pain Type  Chronic pain       Pt. reports B knees are hurting today and L foot remains sensitive to touch.      There.ex.:Standing heel  raises/ toe lifts in //-bars 20x.  Added Airex. Seated/supine L ankle AROM/esometrics all planes (min. To mod. Manual isometrics).   Scifit L5 10 min. (cuing to keep L heel on foot plate). Airex (square/beam) forward/backwards/lateral wt. Shifting with light UE assist. Airex step ups/overs (forward/ lateral) 10x.   TG partial knee flexion/ heel raises/ gastroc stretches 20x each (pain tolerable) Standing wobble board wt. Shifting/ CW/ CCW.  Ice to L foot after tx.        Pt. presents with 8 deg. L ankle DF AROM prior to tx. session. Limited tolerance with STM to L foot (esp. lateral aspect) during desensitization technique and isometrics. Slight L antalgic gait pattern during gait in/out of PT gym. Pt. has remained compliant with [employer] ex. program. No change to currently HEP at this time.      PT Long Term Goals - 09/26/17 1529      PT LONG TERM GOAL #1   Title  Pt. I with HEP to increase L ankle AROM to WNL as compared to R ankle to improve pain-free mobility.      Baseline  L/R ankle AROM:  DF: -5/6 deg.,  PF: 55/67 deg., IV: 29/46 deg., EV: -19/16 deg.  (pain limited with L ankle DF and inversion), pt. reports 10+/10 L foot pain at worst with palpation/ DF;    09/26/16 L/R ankle AROM:  DF: -5/6 deg., PF: 62/67 deg., IV: 30/46 deg., EV: -5/16 deg.  (pain limited with L ankle EV and DF)- pt. reports 10+/10 L foot pain at worst    Time  8    Period  Weeks    Status  On-going    Target Date  11/07/17      PT LONG TERM GOAL #2   Title  Pt. will increase LEFS to >40 out of 80 to improve pain-free mobility/ return to work.      Baseline  LEFS: 21 out of 80 on 11/27; 09/26/17: 36/80    Time  8    Period  Weeks    Status  On-going    Target Date  11/07/17      PT LONG TERM GOAL #3   Title  Pt. will increase B hip flexor/ quad. strengthening to 4+/5 MMT to improve standing tolerance/ walking with more normalized gait pattern.     Baseline  B LE muscle strength grossly 4/5 MMT  (pain limited). 09/26/17: hip flexor 4/5, quad 4+/5     Time  8    Period  Weeks    Status  On-going    Target Date  11/07/17      PT LONG TERM GOAL #4   Title  Pt. will ambulate 10 min. with normalized gait pattern with no increase c/o knee/foot pain.    Baseline  Pt. c/o >6/10 R knee/back/L foot pain with walking.  L antalgic gait pattern; 09/26/17: unchanged    Time  8    Period  Weeks    Status  On-going    Target Date  11/07/17            Plan - 10/19/17 1001    Clinical Presentation  Unstable    Clinical Decision Making  High    Rehab Potential  Fair    PT Frequency  1x / week    PT Duration  6 weeks    PT Treatment/Interventions  ADLs/Self Care Home Management;Aquatic Therapy;Cryotherapy;Iontophoresis 4mg /ml Dexamethasone;Ultrasound;Patient/family education;Neuromuscular re-education;Balance training;Therapeutic exercise;Therapeutic activities;Functional mobility training;Stair training;Gait training;Manual techniques;Passive range of motion;Dry needling    PT Next Visit Plan  Progress HEP/ strengthening ex.         Patient will benefit from skilled therapeutic intervention in order to improve the following deficits and impairments:  Abnormal gait, Decreased strength, Increased muscle spasms, Postural dysfunction, Impaired perceived functional ability, Decreased activity tolerance, Decreased mobility, Impaired flexibility, Obesity, Decreased range of motion, Decreased balance, Pain, Decreased endurance  Visit Diagnosis: Pain in left foot  Chronic pain of right knee  Gait difficulty  Muscle weakness (generalized)     Problem List Patient Active Problem List   Diagnosis Date Noted  . Kidney stone 04/25/2017  . Ureteral stone    Cammie McgeeMichael C Advit Trethewey, PT, DPT # [phone] 10/19/2017, 10:02 AM  [place] [facility] [address] [place], [place], [address] Phone: [phone]   Fax:  [phone]  Name: Kelsey Bell MRN:  [id number] Date of Birth: 09/12/1980

## 2017-10-19 NOTE — Telephone Encounter (Signed)
10/19/17 Prozac 40mg  [facility]17 for Dr. Onnie GrahamAhulwalia to sign for this med, [facility] 10/18/17 and the forms have not been signed, saw Toniann FailWendy and she will get him to sign, and call us.[name]

## 2017-10-24 ENCOUNTER — Encounter: Payer: Self-pay | Admitting: Physical Therapy

## 2017-10-24 ENCOUNTER — Ambulatory Visit: Payer: Worker's Compensation | Admitting: Physical Therapy

## 2017-10-24 DIAGNOSIS — M25561 Pain in right knee: Secondary | ICD-10-CM

## 2017-10-24 DIAGNOSIS — M6281 Muscle weakness (generalized): Secondary | ICD-10-CM

## 2017-10-24 DIAGNOSIS — R269 Unspecified abnormalities of gait and mobility: Secondary | ICD-10-CM

## 2017-10-24 DIAGNOSIS — G8929 Other chronic pain: Secondary | ICD-10-CM

## 2017-10-24 DIAGNOSIS — M79672 Pain in left foot: Secondary | ICD-10-CM

## 2017-10-26 ENCOUNTER — Ambulatory Visit
Admission: RE | Admit: 2017-10-26 | Discharge: 2017-10-26 | Disposition: A | Discharge status: Home / Self Care | Payer: No Typology Code available for payment source | Source: Ambulatory Visit | Attending: Urology | Admitting: Urology

## 2017-10-26 DIAGNOSIS — N838 Other noninflammatory disorders of ovary, fallopian tube and broad ligament: Secondary | ICD-10-CM | POA: Insufficient documentation

## 2017-10-26 DIAGNOSIS — N200 Calculus of kidney: Secondary | ICD-10-CM

## 2017-10-26 NOTE — Therapy (Signed)
[facility] [facility] [address] [place], [place], [address] Phone: [phone]   Fax:  [phone]  Physical Therapy Treatment  Patient Details  Name: Cathe MonsLauren B Cizek MRN: [id number] Date of Birth: 06/27/1981 Referring Provider: Dr. Ethelene Halamos   Encounter Date: 10/24/2017  PT End of Session - 10/26/17 1614    Visit Number  11    Number of Visits  14    Date for PT Re-Evaluation  11/07/17    PT Start Time  1429    PT Stop Time  1535    PT Time Calculation (min)  66 min    Activity Tolerance  Patient limited by pain;Patient tolerated treatment well    Behavior During Therapy  [facility] for tasks assessed/performed       Past Medical History:  Diagnosis Date  . Allergy   . Anxiety   . Arthritis   . Asthma   . Breast pain present for several months   Bil LT >RT across of breasts  . Depression   . Kidney stones   . LGSIL on Pap smear of cervix 2007  . Migraine   . Neuromuscular disorder (HCC)    Complex regional pain syndrome and Fibromyalgia    Past Surgical History:  Procedure Laterality Date  . COLPOSCOPY  2017   neg bx  . EXTRACORPOREAL SHOCK WAVE LITHOTRIPSY Right 04/26/2017   Procedure: EXTRACORPOREAL SHOCK WAVE LITHOTRIPSY (ESWL);  Surgeon: Vanna ScotlandBrandon, Ashley, MD;  Location: [facility] ORS;  Service: Urology;  Laterality: Right;  . KNEE ARTHROSCOPY Right   . TONSILLECTOMY      There were no vitals filed for this visit.    Pt. c/o persistent L foot pain and entered PT with marked antalgic gait pattern. Pt. reports compliance with [employer] ex. program and has lost several more pounds.           Treatment:  There.ex.:Seated/supine L ankle AROM/esometrics all planes.  Scifit L6 10 min. (improved L foot/heel position). Airex (square/beam)forward/backwards/lateralwt. Shifting with light UE assist. Airex step ups/overs (forward/ lateral) 10x. TG knee flexion 30x (no increase c/o knee pain).  Heel/toe raises  20x. Standing BOSU step ups/ wt. Shifting.   Manual tx.: Supine L ankle AA/PROM all planes of movement with static holds. L ankle subtalar mobs. Grade II 3x20 sec. STM to L dorsal aspect of foot (tender/ pain limited).  Desensitization technique.          Pt. continues to demonstrate marked gains in L ankle DF/ PF and toe extension during gait pattern/ use of TG. Pt. remains pain focused with light touch/ palpation to L foot (generalized) during manual tx./ isometrics. Pt. able to complete TG knee flexion/ step ups/ wt. shifting ex. on BOSU today with no significant increase in knee pain. Good transition between ther.ex. tasks and pt. remains motivated to work hard with PT.      PT Long Term Goals - 09/26/17 1529      PT LONG TERM GOAL #1   Title  Pt. I with HEP to increase L ankle AROM to WNL as compared to R ankle to improve pain-free mobility.      Baseline  L/R ankle AROM:  DF: -5/6 deg., PF: 55/67 deg., IV: 29/46 deg., EV: -19/16 deg.  (pain limited with L ankle DF and inversion), pt. reports 10+/10 L foot pain at worst with palpation/ DF;    09/26/16 L/R ankle AROM:  DF: -5/6 deg., PF: 62/67 deg., IV: 30/46 deg., EV: -5/16 deg.  (  pain limited with L ankle EV and DF)- pt. reports 10+/10 L foot pain at worst    Time  8    Period  Weeks    Status  On-going    Target Date  11/07/17      PT LONG TERM GOAL #2   Title  Pt. will increase LEFS to >40 out of 80 to improve pain-free mobility/ return to work.      Baseline  LEFS: 21 out of 80 on 11/27; 09/26/17: 36/80    Time  8    Period  Weeks    Status  On-going    Target Date  11/07/17      PT LONG TERM GOAL #3   Title  Pt. will increase B hip flexor/ quad. strengthening to 4+/5 MMT to improve standing tolerance/ walking with more normalized gait pattern.     Baseline  B LE muscle strength grossly 4/5 MMT (pain limited). 09/26/17: hip flexor 4/5, quad 4+/5     Time  8    Period  Weeks    Status  On-going    Target Date   11/07/17      PT LONG TERM GOAL #4   Title  Pt. will ambulate 10 min. with normalized gait pattern with no increase c/o knee/foot pain.    Baseline  Pt. c/o >6/10 R knee/back/L foot pain with walking.  L antalgic gait pattern; 09/26/17: unchanged    Time  8    Period  Weeks    Status  On-going    Target Date  11/07/17            Plan - 10/26/17 1614    Clinical Impression Statement  Pt. continues to demonstrate marked gains in L ankle DF/ PF and toe extension during gait pattern/ use of TG.  Pt. remains pain focused with light touch/ palpation to L foot (generalized) during manual tx./ isometrics.  Pt. able to complete TG knee flexion/ step ups/ wt. shifting ex. on BOSU today with no significant increase in knee pain.  Good transition between ther.ex. tasks and pt. remains motivated to work hard with PT.       Clinical Presentation  Unstable    Clinical Decision Making  High    Rehab Potential  Fair    PT Frequency  1x / week    PT Duration  6 weeks    PT Treatment/Interventions  ADLs/Self Care Home Management;Aquatic Therapy;Cryotherapy;Iontophoresis 4mg /ml Dexamethasone;Ultrasound;Patient/family education;Neuromuscular re-education;Balance training;Therapeutic exercise;Therapeutic activities;Functional mobility training;Stair training;Gait training;Manual techniques;Passive range of motion;Dry needling    PT Next Visit Plan  Progress HEP/ strengthening ex.  REASSESS GOALS.      PT Home Exercise Plan  See handouts.         Patient will benefit from skilled therapeutic intervention in order to improve the following deficits and impairments:  Abnormal gait, Decreased strength, Increased muscle spasms, Postural dysfunction, Impaired perceived functional ability, Decreased activity tolerance, Decreased mobility, Impaired flexibility, Obesity, Decreased range of motion, Decreased balance, Pain, Decreased endurance  Visit Diagnosis: Pain in left foot  Chronic pain of right knee  Gait  difficulty  Muscle weakness (generalized)     Problem List Patient Active Problem List   Diagnosis Date Noted  . Kidney stone 04/25/2017  . Ureteral stone    Cammie Mcgee, PT, DPT # [phone] 10/26/2017, 4:21 PM  [place] [facility] [address] [place], [place], [address] Phone: [phone]   Fax:  [phone]  Name: IRMA ROULHAC MRN: [id number] Date of Birth: 1980/10/13

## 2017-10-31 ENCOUNTER — Ambulatory Visit: Payer: Worker's Compensation | Admitting: Physical Therapy

## 2017-10-31 ENCOUNTER — Encounter: Payer: Self-pay | Admitting: Physical Therapy

## 2017-10-31 DIAGNOSIS — M6281 Muscle weakness (generalized): Secondary | ICD-10-CM

## 2017-10-31 DIAGNOSIS — G8929 Other chronic pain: Secondary | ICD-10-CM

## 2017-10-31 DIAGNOSIS — M79672 Pain in left foot: Secondary | ICD-10-CM

## 2017-10-31 DIAGNOSIS — R269 Unspecified abnormalities of gait and mobility: Secondary | ICD-10-CM

## 2017-10-31 DIAGNOSIS — M25561 Pain in right knee: Secondary | ICD-10-CM

## 2017-11-01 ENCOUNTER — Encounter: Payer: Self-pay | Admitting: Obstetrics and Gynecology

## 2017-11-01 NOTE — Therapy (Addendum)
[place] [facility] [address] [place], [place], [address] Phone: [phone]   Fax:  [phone]  Physical Therapy Treatment  Patient Details  Name: Kelsey Bell MRN: [id number] Date of Birth: 08/07/1981 Referring Provider: Dr. Ethelene Halamos   Encounter Date: 10/31/2017    PT End of Session - 11/01/17 2029    Visit Number  12    Number of Visits  14    Date for PT Re-Evaluation  11/07/17    PT Start Time  1639    PT Stop Time  1731    PT Time Calculation (min)  52 min    Activity Tolerance  Patient limited by pain;Patient tolerated treatment well    Behavior During Therapy  [facility] for [facility] assessed/performed         Past Medical History:  Diagnosis Date  . Allergy   . Anxiety   . Arthritis   . Asthma   . Breast pain present for several months   Bil LT >RT across of breasts  . Depression   . Kidney stones   . LGSIL on Pap smear of cervix 2007  . Migraine   . Neuromuscular disorder (HCC)    Complex regional pain syndrome and Fibromyalgia    Past Surgical History:  Procedure Laterality Date  . COLPOSCOPY  2017   neg bx  . EXTRACORPOREAL SHOCK WAVE LITHOTRIPSY Right 04/26/2017   Procedure: EXTRACORPOREAL SHOCK WAVE LITHOTRIPSY (ESWL);  Surgeon: Vanna ScotlandBrandon, Ashley, MD;  Location: [facility] ORS;  Service: Urology;  Laterality: Right;  . KNEE ARTHROSCOPY Right   . TONSILLECTOMY      There were no vitals filed for this visit.    Pt. enters PT with moderate L antalgic gait pattern. Pt. reports she has to put pain patch on prior to starting PT today.        Treatment:  There.ex.:Seated/supine L ankle AROM/esometrics all planes.  Scifit L610 min. (improved L foot/heel position). Airex (square/beam)forward/backwards/lateralwt. Shifting with light UE assist. Airex step ups/overs (forward/ lateral) 10x. TG knee flexion 30x (no increase c/o knee pain). Heel/toe raises 20x. Standing BOSU step ups/ wt. Shifting (limit use of  hands).    Manual tx.: Supine L ankle AA/PROM all planes of movement with static holds. L ankle subtalar mobs. Grade II 3x20 sec. STM to L dorsal aspect of foot (tender/ pain limited).   Discussed HEP in depth with patient.            Generalized L foot tenderness with manual tx./ palpation with continued increase in active DF/PF. No redness or swelling present as compared to R foot. Pt. doing higher level L ankle stability tasks (BOSU/ wt. shifting) but remains pain focused.      PT Long Term Goals - 09/26/17 1529      PT LONG TERM GOAL #1   Title  Pt. I with HEP to increase L ankle AROM to WNL as compared to R ankle to improve pain-free mobility.      Baseline  L/R ankle AROM:  DF: -5/6 deg., PF: 55/67 deg., IV: 29/46 deg., EV: -19/16 deg.  (pain limited with L ankle DF and inversion), pt. reports 10+/10 L foot pain at worst with palpation/ DF;    09/26/16 L/R ankle AROM:  DF: -5/6 deg., PF: 62/67 deg., IV: 30/46 deg., EV: -5/16 deg.  (pain limited with L ankle EV and DF)- pt. reports 10+/10 L foot pain at worst    Time  8    Period  Weeks    Status  On-going    Target Date  11/07/17      PT LONG TERM GOAL #2   Title  Pt. will increase LEFS to >40 out of 80 to improve pain-free mobility/ return to work.      Baseline  LEFS: 21 out of 80 on 11/27; 09/26/17: 36/80    Time  8    Period  Weeks    Status  On-going    Target Date  11/07/17      PT LONG TERM GOAL #3   Title  Pt. will increase B hip flexor/ quad. strengthening to 4+/5 MMT to improve standing tolerance/ walking with more normalized gait pattern.     Baseline  B LE muscle strength grossly 4/5 MMT (pain limited). 09/26/17: hip flexor 4/5, quad 4+/5     Time  8    Period  Weeks    Status  On-going    Target Date  11/07/17      PT LONG TERM GOAL #4   Title  Pt. will ambulate 10 min. with normalized gait pattern with no increase c/o knee/foot pain.    Baseline  Pt. c/o >6/10 R knee/back/L foot pain with  walking.  L antalgic gait pattern; 09/26/17: unchanged    Time  8    Period  Weeks    Status  On-going    Target Date  11/07/17              Patient will benefit from skilled therapeutic intervention in order to improve the following deficits and impairments:  Abnormal gait, Decreased strength, Increased muscle spasms, Postural dysfunction, Impaired perceived functional ability, Decreased activity tolerance, Decreased mobility, Impaired flexibility, Obesity, Decreased range of motion, Decreased balance, Pain, Decreased endurance  Visit Diagnosis: Pain in left foot  Chronic pain of right knee  Gait difficulty  Muscle weakness (generalized)     Problem List Patient Active Problem List   Diagnosis Date Noted  . Kidney stone 04/25/2017  . Ureteral stone    Cammie Mcgee, PT, DPT # [phone] 11/07/2017, 12:34 PM  [place] [facility] [address] [place], [place], [address] Phone: [phone]   Fax:  [phone]  Name: Kelsey Bell MRN: [id number] Date of Birth: 06/25/1981

## 2017-11-02 ENCOUNTER — Other Ambulatory Visit: Payer: Self-pay | Admitting: Obstetrics and Gynecology

## 2017-11-02 DIAGNOSIS — M792 Neuralgia and neuritis, unspecified: Secondary | ICD-10-CM | POA: Insufficient documentation

## 2017-11-02 DIAGNOSIS — N83202 Unspecified ovarian cyst, left side: Secondary | ICD-10-CM

## 2017-11-02 NOTE — Progress Notes (Signed)
Persistent LTO enlargement on CT scan. Had LTO cyst 10/18 u/s.

## 2017-11-07 ENCOUNTER — Ambulatory Visit: Payer: Worker's Compensation | Attending: Physical Medicine and Rehabilitation | Admitting: Physical Therapy

## 2017-11-07 ENCOUNTER — Encounter: Payer: Self-pay | Admitting: Physical Therapy

## 2017-11-07 DIAGNOSIS — M79672 Pain in left foot: Secondary | ICD-10-CM

## 2017-11-07 DIAGNOSIS — M6281 Muscle weakness (generalized): Secondary | ICD-10-CM

## 2017-11-07 DIAGNOSIS — M25561 Pain in right knee: Secondary | ICD-10-CM | POA: Insufficient documentation

## 2017-11-07 DIAGNOSIS — R269 Unspecified abnormalities of gait and mobility: Secondary | ICD-10-CM

## 2017-11-07 DIAGNOSIS — G8929 Other chronic pain: Secondary | ICD-10-CM

## 2017-11-07 NOTE — Therapy (Signed)
[facility] [address] [place], [place], [address] Phone: [phone]   Fax:  [phone]  Physical Therapy Treatment  Patient Details  Name: Kelsey Bell MRN: [id number] Date of Birth: 03/25/1981 Referring Provider: Dr. Ethelene Halamos   Encounter Date: 11/07/2017    Treatment 13 of 14.  Recert: 11/07/17   Past Medical History:  Diagnosis Date  . Allergy   . Anxiety   . Arthritis   . Asthma   . Breast pain present for several months   Bil LT >RT across of breasts  . Depression   . Kidney stones   . LGSIL on Pap smear of cervix 2007  . Migraine   . Neuromuscular disorder (HCC)    Complex regional pain syndrome and Fibromyalgia    Past Surgical History:  Procedure Laterality Date  . COLPOSCOPY  2017   neg bx  . EXTRACORPOREAL SHOCK WAVE LITHOTRIPSY Right 04/26/2017   Procedure: EXTRACORPOREAL SHOCK WAVE LITHOTRIPSY (ESWL);  Surgeon: Vanna ScotlandBrandon, Ashley, MD;  Location: [facility] ORS;  Service: Urology;  Laterality: Right;  . KNEE ARTHROSCOPY Right   . TONSILLECTOMY      There were no vitals filed for this visit.    Pt. c/o significant pain in low back due to a kidney stone that needs to pass. Pt. reports drinking a lot of water to help pass the stone.      MH to L low back prior to manual tx./ during history taking.    There.ex.: Seated/supine L ankle AROM/esometrics all planes.  Scifit L610 min. (improved L foot/heel position). Airex (square/beam)forward/backwards/lateralwt. Shifting with light UE assist. Airex step ups/overs (forward/ lateral) 10x. TG knee flexion 30x (no increase c/o knee pain). Heel/toe raises 20x. Standing BOSU step ups/ wt. Shifting (limit use of hands).    Manual tx.: Supine L ankle AA/PROM all planes of movement with static holds. L ankle subtalar mobs. Grade II 3x20 sec. STM to L dorsal aspect of foot (tender/ pain limited).        Pt. limited by persistent back pain due to kidney  stone. Pt. has progressed well with L ankle AROM/ stability with varying c/o pain due to increase activity/ swelling. Generalized tenderness with light palpation along L tib. anterior/ lateral aspect of foot/ankle.     PT Long Term Goals - 09/26/17 1529      PT LONG TERM GOAL #1   Title  Pt. I with HEP to increase L ankle AROM to WNL as compared to R ankle to improve pain-free mobility.      Baseline  L/R ankle AROM:  DF: -5/6 deg., PF: 55/67 deg., IV: 29/46 deg., EV: -19/16 deg.  (pain limited with L ankle DF and inversion), pt. reports 10+/10 L foot pain at worst with palpation/ DF;    09/26/16 L/R ankle AROM:  DF: -5/6 deg., PF: 62/67 deg., IV: 30/46 deg., EV: -5/16 deg.  (pain limited with L ankle EV and DF)- pt. reports 10+/10 L foot pain at worst    Time  8    Period  Weeks    Status  On-going    Target Date  11/07/17      PT LONG TERM GOAL #2   Title  Pt. will increase LEFS to >40 out of 80 to improve pain-free mobility/ return to work.      Baseline  LEFS: 21 out of 80 on 11/27; 09/26/17: 36/80    Time  8    Period  Weeks  Status  On-going    Target Date  11/07/17      PT LONG TERM GOAL #3   Title  Pt. will increase B hip flexor/ quad. strengthening to 4+/5 MMT to improve standing tolerance/ walking with more normalized gait pattern.     Baseline  B LE muscle strength grossly 4/5 MMT (pain limited). 09/26/17: hip flexor 4/5, quad 4+/5     Time  8    Period  Weeks    Status  On-going    Target Date  11/07/17      PT LONG TERM GOAL #4   Title  Pt. will ambulate 10 min. with normalized gait pattern with no increase c/o knee/foot pain.    Baseline  Pt. c/o >6/10 R knee/back/L foot pain with walking.  L antalgic gait pattern; 09/26/17: unchanged    Time  8    Period  Weeks    Status  On-going    Target Date  11/07/17         Patient will benefit from skilled therapeutic intervention in order to improve the following deficits and impairments:  Abnormal gait, Decreased  strength, Increased muscle spasms, Postural dysfunction, Impaired perceived functional ability, Decreased activity tolerance, Decreased mobility, Impaired flexibility, Obesity, Decreased range of motion, Decreased balance, Pain, Decreased endurance  Visit Diagnosis: Pain in left foot  Chronic pain of right knee  Gait difficulty  Muscle weakness (generalized)     Problem List Patient Active Problem List   Diagnosis Date Noted  . Kidney stone 04/25/2017  . Ureteral stone    Cammie Mcgee, PT, DPT # [phone] 11/14/2017, 2:01 PM  [facility] [address] [place], [place], [address] Phone: [phone]   Fax:  [phone]  Name: Kelsey Bell MRN: [id number] Date of Birth: April 10, 1981

## 2017-11-08 ENCOUNTER — Ambulatory Visit (INDEPENDENT_AMBULATORY_CARE_PROVIDER_SITE_OTHER): Payer: Self-pay

## 2017-11-08 DIAGNOSIS — N83202 Unspecified ovarian cyst, left side: Secondary | ICD-10-CM

## 2017-11-09 ENCOUNTER — Telehealth: Payer: Self-pay | Admitting: Pharmacist

## 2017-11-09 ENCOUNTER — Telehealth: Payer: Self-pay | Admitting: Pharmacy Technician

## 2017-11-09 NOTE — Telephone Encounter (Signed)
11/09/2017 11:56:30 AM - Prozac form from provider office  11/09/17 Went to [place] on 11/08/17 to pick up Prozac forms, and this had been signed by provider, it was dropped off at their office 10/04/17.[name]

## 2017-11-09 NOTE — Telephone Encounter (Signed)
Patient failed to provide 2019 proof of income.  No additional medication assistance will be provided by [facility] without the required proof of income documentation.  Patient notified by letter.  Sherilyn DacostaBetty J. Markavious Micco Care Manager [facility]

## 2017-11-09 NOTE — Telephone Encounter (Signed)
11/09/2017 1:35:16 PM - Prozac refill  11/09/17 Faxed [employer] a refill request for Prozac 40mg  Take 2 capsules (80mg ) by mouth daily at bedtime.[name]

## 2017-11-14 ENCOUNTER — Ambulatory Visit: Payer: Self-pay | Attending: Physical Medicine and Rehabilitation | Admitting: Physical Therapy

## 2017-11-14 DIAGNOSIS — M79672 Pain in left foot: Secondary | ICD-10-CM | POA: Insufficient documentation

## 2017-11-14 DIAGNOSIS — M6281 Muscle weakness (generalized): Secondary | ICD-10-CM | POA: Insufficient documentation

## 2017-11-14 DIAGNOSIS — M25561 Pain in right knee: Secondary | ICD-10-CM | POA: Insufficient documentation

## 2017-11-14 DIAGNOSIS — R269 Unspecified abnormalities of gait and mobility: Secondary | ICD-10-CM | POA: Insufficient documentation

## 2017-11-14 DIAGNOSIS — G8929 Other chronic pain: Secondary | ICD-10-CM | POA: Insufficient documentation

## 2017-11-15 ENCOUNTER — Encounter: Payer: Self-pay | Admitting: Physical Therapy

## 2017-11-17 NOTE — Therapy (Signed)
[facility] [address] [place], [place], [address] Phone: [phone]   Fax:  [phone]  Physical Therapy Treatment  Patient Details  Name: Kelsey Bell MRN: [id number] Date of Birth: 05/24/1981 Referring Provider: Dr. Ethelene Halamos   Encounter Date: 11/14/2017  Treatment: 14 out of 18.  Recert: 12/12/17  Past Medical History:  Diagnosis Date  . Allergy   . Anxiety   . Arthritis   . Asthma   . Breast pain present for several months   Bil LT >RT across of breasts  . Depression   . Kidney stones   . LGSIL on Pap smear of cervix 2007  . Migraine   . Neuromuscular disorder (HCC)    Complex regional pain syndrome and Fibromyalgia    Past Surgical History:  Procedure Laterality Date  . COLPOSCOPY  2017   neg bx  . EXTRACORPOREAL SHOCK WAVE LITHOTRIPSY Right 04/26/2017   Procedure: EXTRACORPOREAL SHOCK WAVE LITHOTRIPSY (ESWL);  Surgeon: Vanna ScotlandBrandon, Ashley, MD;  Location: [facility] ORS;  Service: Urology;  Laterality: Right;  . KNEE ARTHROSCOPY Right   . TONSILLECTOMY      There were no vitals filed for this visit.      [facility] PT Assessment - 11/17/17 0001      Assessment   Medical Diagnosis  L foot neuropathic pain    Referring Provider  Dr. Ethelene Halamos    Onset Date/Surgical Date  01/20/16        Pt reports that she is going to [employer] later today. Reports some LLE knee pain. Foot is sensitive today.     SCIFIT x 10 minutes (unbilled)  Patient reported chronic pain in LLE foot, not improved with towel/manual stroking; patient indicated preference for TENS over ice/heat contrast.  TENS for pain management: Continuous mode, 25.5 mV, 4 electrodes with IFC centered on metatarsals 2-3, concurrent with manual treatment:  LLE Therapist-assisted gastroc/soleus stretch 3 x 30 sec ea LLE Ankle 4-way isometrics 5 sec hold x 10. Without TENS: BLE calf stretch on stairs 3 x 30 sec  Total gym level 20: Calf raises 3 x 10 Squats  2 x 10; patient complained of pain in LLE knee consistent with patellar maltracking, some improvement with patellar assist. Hyperextension of knees noted. Patient able to correct with cues.  Goals assessed; performance as recorded below.     Pt shows modest gains on goals with improvements in ROM, strength, and outcome measures. L foot remains sensitized, reduced with TENS but without carryover after treatment. Complaint of BLE knee pain with reduced bodyweight squats consistent with PFPS on L knee; hyperextension noted. Patient will benefit from additional skilled physical therapy to improve LLE biomechanics and reduce chronic pain for return to PLOF.       PT Long Term Goals - 11/14/17 1719      PT LONG TERM GOAL #1   Title  Pt. I with HEP to increase L ankle AROM to WNL as compared to R ankle to improve pain-free mobility.      Baseline  L/R ankle AROM:  DF: -5/6 deg., PF: 55/67 deg., IV: 29/46 deg., EV: -19/16 deg.  (pain limited with L ankle DF and inversion), pt. reports 10+/10 L foot pain at worst with palpation/ DF;    09/26/16 L/R ankle AROM:  DF: -5/6 deg., PF: 62/67 deg., IV: 30/46 deg., EV: -5/16 deg.  (pain limited with L ankle EV and DF)- pt. reports 10+/10 L foot pain at worst; 11/14/17 L/R ankle  AROM: DF: 4/6 deg., PF 64/67 deg., IV 30/46 deg., EV 4/16 deg., pt reports 6/10 pain    Time  4    Period  Weeks    Status  On-going    Target Date  12/12/17      PT LONG TERM GOAL #2   Title  Pt. will increase LEFS to >40 out of 80 to improve pain-free mobility/ return to work.      Baseline  LEFS: 21 out of 80 on 11/27; 09/26/17: 36/80; 11/14/17 37/80    Time  4    Period  Weeks    Status  On-going    Target Date  12/12/17      PT LONG TERM GOAL #3   Title  Pt. will increase B hip flexor/ quad. strengthening to 4+/5 MMT to improve standing tolerance/ walking with more normalized gait pattern.     Baseline  B LE muscle strength grossly 4/5 MMT (pain limited). 09/26/17: hip flexor  4/5, quad 4+/5 ; 11/14/17 hip flexor 4+/5, L quad 4/5, R quad 4+/5    Time  4    Period  Weeks    Status  On-going    Target Date  12/12/17      PT LONG TERM GOAL #4   Title  Pt. will ambulate 10 min. with normalized gait pattern with no increase c/o knee/foot pain.    Baseline  Pt. c/o >6/10 R knee/back/L foot pain with walking.  L antalgic gait pattern; 09/26/17: unchanged; 11/14/17 no antalgic gait but pt reports inability to walk 10 min pain-free    Time  4    Period  Weeks    Status  On-going    Target Date  12/12/17              Patient will benefit from skilled therapeutic intervention in order to improve the following deficits and impairments:  Abnormal gait, Decreased strength, Increased muscle spasms, Postural dysfunction, Impaired perceived functional ability, Decreased activity tolerance, Decreased mobility, Impaired flexibility, Obesity, Decreased range of motion, Decreased balance, Pain, Decreased endurance  Visit Diagnosis: Pain in left foot - Plan: PT plan of care cert/re-cert  Chronic pain of right knee - Plan: PT plan of care cert/re-cert  Gait difficulty - Plan: PT plan of care cert/re-cert  Muscle weakness (generalized) - Plan: PT plan of care cert/re-cert     Problem List Patient Active Problem List   Diagnosis Date Noted  . Kidney stone 04/25/2017  . Ureteral stone    Cammie Mcgee, PT, DPT # [phone] 11/17/2017, 10:27 AM  [place] [facility] [address] [place], [place], [address] Phone: [phone]   Fax:  [phone]  Name: Kelsey Bell MRN: [id number] Date of Birth: [birth year]

## 2017-11-21 ENCOUNTER — Ambulatory Visit: Payer: Self-pay | Admitting: Physical Therapy

## 2017-11-21 DIAGNOSIS — G8929 Other chronic pain: Secondary | ICD-10-CM

## 2017-11-21 DIAGNOSIS — M25561 Pain in right knee: Secondary | ICD-10-CM

## 2017-11-21 DIAGNOSIS — M6281 Muscle weakness (generalized): Secondary | ICD-10-CM

## 2017-11-21 DIAGNOSIS — M79672 Pain in left foot: Secondary | ICD-10-CM

## 2017-11-21 DIAGNOSIS — R269 Unspecified abnormalities of gait and mobility: Secondary | ICD-10-CM

## 2017-11-28 ENCOUNTER — Ambulatory Visit: Payer: Self-pay | Admitting: Physical Therapy

## 2017-11-28 DIAGNOSIS — R269 Unspecified abnormalities of gait and mobility: Secondary | ICD-10-CM

## 2017-11-28 DIAGNOSIS — M6281 Muscle weakness (generalized): Secondary | ICD-10-CM

## 2017-11-28 DIAGNOSIS — M79672 Pain in left foot: Secondary | ICD-10-CM

## 2017-11-28 NOTE — Therapy (Signed)
[place] [facility] [address] [place], [place], [address] Phone: [phone]   Fax:  [phone]  Physical Therapy Treatment  Patient Details  Name: Kelsey Bell MRN: [id number] Date of Birth: 04/23/1981 Referring Provider: Dr. Ethelene Halamos   Encounter Date: 11/21/2017  PT End of Session - 11/28/17 1111    Visit Number  15    Number of Visits  18    Date for PT Re-Evaluation  12/12/17    PT Start Time  1517    PT Stop Time  1620    PT Time Calculation (min)  63 min    Activity Tolerance  Patient limited by pain;Patient tolerated treatment well    Behavior During Therapy  [facility] for tasks assessed/performed       Past Medical History:  Diagnosis Date  . Allergy   . Anxiety   . Arthritis   . Asthma   . Breast pain present for several months   Bil LT >RT across of breasts  . Depression   . Kidney stones   . LGSIL on Pap smear of cervix 2007  . Migraine   . Neuromuscular disorder (HCC)    Complex regional pain syndrome and Fibromyalgia    Past Surgical History:  Procedure Laterality Date  . COLPOSCOPY  2017   neg bx  . EXTRACORPOREAL SHOCK WAVE LITHOTRIPSY Right 04/26/2017   Procedure: EXTRACORPOREAL SHOCK WAVE LITHOTRIPSY (ESWL);  Surgeon: Vanna ScotlandBrandon, Ashley, MD;  Location: [facility] ORS;  Service: Urology;  Laterality: Right;  . KNEE ARTHROSCOPY Right   . TONSILLECTOMY      There were no vitals filed for this visit.  Subjective Assessment - 11/28/17 1110    Subjective  Pt. states L foot is really hurting today.  Pt. has been going to [employer] on a regular schedule.      Pertinent History  pt. known well to PT clinic.      Limitations  Standing;Walking;House hold activities;Other (comment)    Patient Stated Goals  Decrease knee/foot pain.  Improve walking    Currently in Pain?  Yes    Pain Score  5     Pain Location  Foot    Pain Orientation  Left    Pain Descriptors / Indicators  Aching;Burning    Pain Type  Chronic pain        Ice to B knees prior to tx. Session (elevated position).  There.ex.:  Scifit L610 min. (improved L foot/heel position). Seated/supine L ankle AROM/esometrics all planes.  Airex (square/beam)forward/backwards/lateralwt. Shifting with light UE assist. Airex step ups/overs (forward/ lateral) 10x. TG knee flexion 30x (no increase c/o knee pain). Heel/toe raises 20x. Standing BOSU step ups/ wt. Shifting(limit use of hands).  Manual tx.: Supine L ankle AA/PROM all planes of movement with static holds. L ankle subtalar mobs. Grade II 3x20 sec. STM to L dorsal aspect of foot (tender/ pain limited).       PT Long Term Goals - 11/14/17 1719      PT LONG TERM GOAL #1   Title  Pt. I with HEP to increase L ankle AROM to WNL as compared to R ankle to improve pain-free mobility.      Baseline  L/R ankle AROM:  DF: -5/6 deg., PF: 55/67 deg., IV: 29/46 deg., EV: -19/16 deg.  (pain limited with L ankle DF and inversion), pt. reports 10+/10 L foot pain at worst with palpation/ DF;    09/26/16 L/R ankle AROM:  DF: -5/6 deg.,  PF: 62/67 deg., IV: 30/46 deg., EV: -5/16 deg.  (pain limited with L ankle EV and DF)- pt. reports 10+/10 L foot pain at worst; 11/14/17 L/R ankle AROM: DF: 4/6 deg., PF 64/67 deg., IV 30/46 deg., EV 4/16 deg., pt reports 6/10 pain    Time  4    Period  Weeks    Status  On-going    Target Date  12/12/17      PT LONG TERM GOAL #2   Title  Pt. will increase LEFS to >40 out of 80 to improve pain-free mobility/ return to work.      Baseline  LEFS: 21 out of 80 on 11/27; 09/26/17: 36/80; 11/14/17 37/80    Time  4    Period  Weeks    Status  On-going    Target Date  12/12/17      PT LONG TERM GOAL #3   Title  Pt. will increase B hip flexor/ quad. strengthening to 4+/5 MMT to improve standing tolerance/ walking with more normalized gait pattern.     Baseline  B LE muscle strength grossly 4/5 MMT (pain limited). 09/26/17: hip flexor 4/5, quad 4+/5 ; 11/14/17 hip  flexor 4+/5, L quad 4/5, R quad 4+/5    Time  4    Period  Weeks    Status  On-going    Target Date  12/12/17      PT LONG TERM GOAL #4   Title  Pt. will ambulate 10 min. with normalized gait pattern with no increase c/o knee/foot pain.    Baseline  Pt. c/o >6/10 R knee/back/L foot pain with walking.  L antalgic gait pattern; 09/26/17: unchanged; 11/14/17 no antalgic gait but pt reports inability to walk 10 min pain-free    Time  4    Period  Weeks    Status  On-going    Target Date  12/12/17         Plan - 11/28/17 1115    Clinical Impression Statement  Pt. remains pain focused with light touch/ brushing of L foot/ lower leg.  Pt. presents with good L ankle AROM and progressing well with ankle stability ex. program.  Pt. limited with lunges/ squats secondary to chronic h/o knee pain.  No changes to HEP at this time.      Clinical Presentation  Unstable    Clinical Decision Making  High    Rehab Potential  Fair    PT Frequency  1x / week    PT Duration  4 weeks    PT Treatment/Interventions  ADLs/Self Care Home Management;Aquatic Therapy;Cryotherapy;Iontophoresis 4mg /ml Dexamethasone;Ultrasound;Patient/family education;Neuromuscular re-education;Balance training;Therapeutic exercise;Therapeutic activities;Functional mobility training;Stair training;Gait training;Manual techniques;Passive range of motion;Dry needling    PT Next Visit Plan  Progress HEP / balance.   Discuss how brushing/ desensitization technique is going.      PT Home Exercise Plan  See handouts.      Consulted and Agree with Plan of Care  Patient       Patient will benefit from skilled therapeutic intervention in order to improve the following deficits and impairments:  Abnormal gait, Decreased strength, Increased muscle spasms, Postural dysfunction, Impaired perceived functional ability, Decreased activity tolerance, Decreased mobility, Impaired flexibility, Obesity, Decreased range of motion, Decreased balance, Pain,  Decreased endurance  Visit Diagnosis: Pain in left foot  Chronic pain of right knee  Gait difficulty  Muscle weakness (generalized)     Problem List Patient Active Problem List   Diagnosis Date Noted  . Kidney  stone 04/25/2017  . Ureteral stone    Cammie Mcgee, PT, DPT # [id number] 11/28/2017, 11:24 AM  [facility] [address] [place], [place], [address] Phone: [phone]   Fax:  [phone]  Name: Kelsey Bell MRN: [id number] Date of Birth: 24-Jan-1981

## 2017-11-28 NOTE — Therapy (Addendum)
[facility] [address]. [place], [place], [address] Phone: [phone]   Fax:  [phone]  Physical Therapy Treatment  Patient Details  Name: Kelsey Bell MRN: [id number] Date of Birth: [birth year] Referring Provider: Dr. Ethelene Hal   Encounter Date: 11/28/2017  PT End of Session - 11/30/17 1336    Visit Number  16    Number of Visits  18    Date for PT Re-Evaluation  12/12/17    PT Start Time  1511    PT Stop Time  1622    PT Time Calculation (min)  71 min    Activity Tolerance  Patient limited by pain;Patient tolerated treatment well    Behavior During Therapy  [facility] for tasks assessed/performed       Past Medical History:  Diagnosis Date  . Allergy   . Anxiety   . Arthritis   . Asthma   . Breast pain present for several months   Bil LT >RT across of breasts  . Depression   . Kidney stones   . LGSIL on Pap smear of cervix 2007  . Migraine   . Neuromuscular disorder (HCC)    Complex regional pain syndrome and Fibromyalgia    Past Surgical History:  Procedure Laterality Date  . COLPOSCOPY  2017   neg bx  . EXTRACORPOREAL SHOCK WAVE LITHOTRIPSY Right 04/26/2017   Procedure: EXTRACORPOREAL SHOCK WAVE LITHOTRIPSY (ESWL);  Surgeon: Vanna Scotland, MD;  Location: [facility] ORS;  Service: Urology;  Laterality: Right;  . KNEE ARTHROSCOPY Right   . TONSILLECTOMY      There were no vitals filed for this visit.  Subjective Assessment - 11/30/17 1334    Pertinent History  pt. known well to PT clinic.      Limitations  Standing;Walking;House hold activities;Other (comment)    Patient Stated Goals  Decrease knee/foot pain.  Improve walking    Currently in Pain?  Yes    Pain Score  5     Pain Location  Foot    Pain Orientation  Left    Pain Descriptors / Indicators  Aching;Burning       No new complaints. Pt. continues to maintain good compliance with [employer] ex. program and wt. loss.        Warm-up SCIFIT lv 6 x  10 min (unbilled)   Manual therapy: Desensitization to dorsal R foot, great toe with proximal strokes, washcloth x 4 min Therapist-assisted RLE Calf stretch 3 x 30 sec RLE Four-way ankle isometrics with 3 sec hold x 15 each - limited by occasional pain RLE Contract-relax calf stretch 3 x 30 sec RLE Navicular superior/inferior mobilization 3 bouts of 20 sec  Soft tissue mobilization to BLE adductors x 6 min  Adductor strech 3 x 30 sec - patient reported reduced pain, demonstrated less antalgic gait pattern following therapy  RLE calf stretch in standing 3 x 30 sec   Patient education on CRPS reinforced with emphasis on reducing pain focus to attenuate central sensitization and continuing to use brush, washcloth at home.  Patient instructed in pectoral stretch for reduced pain with gym activities        Pt. remains pain focused with light touch/ manual technique of L foot/ lower leg during tx. session. Pt. presents with improved L ankle AROM and progressing well with ankle stability ex. program. L ankle stability during higher level standing ther.ex.    PT Long Term Goals - 11/14/17 1719  PT LONG TERM GOAL #1   Title  Pt. I with HEP to increase L ankle AROM to WNL as compared to R ankle to improve pain-free mobility.      Baseline  L/R ankle AROM:  DF: -5/6 deg., PF: 55/67 deg., IV: 29/46 deg., EV: -19/16 deg.  (pain limited with L ankle DF and inversion), pt. reports 10+/10 L foot pain at worst with palpation/ DF;    09/26/16 L/R ankle AROM:  DF: -5/6 deg., PF: 62/67 deg., IV: 30/46 deg., EV: -5/16 deg.  (pain limited with L ankle EV and DF)- pt. reports 10+/10 L foot pain at worst; 11/14/17 L/R ankle AROM: DF: 4/6 deg., PF 64/67 deg., IV 30/46 deg., EV 4/16 deg., pt reports 6/10 pain    Time  4    Period  Weeks    Status  On-going    Target Date  12/12/17      PT LONG TERM GOAL #2   Title  Pt. will increase LEFS to >40 out of 80 to improve pain-free mobility/ return to work.       Baseline  LEFS: 21 out of 80 on 11/27; 09/26/17: 36/80; 11/14/17 37/80    Time  4    Period  Weeks    Status  On-going    Target Date  12/12/17      PT LONG TERM GOAL #3   Title  Pt. will increase B hip flexor/ quad. strengthening to 4+/5 MMT to improve standing tolerance/ walking with more normalized gait pattern.     Baseline  B LE muscle strength grossly 4/5 MMT (pain limited). 09/26/17: hip flexor 4/5, quad 4+/5 ; 11/14/17 hip flexor 4+/5, L quad 4/5, R quad 4+/5    Time  4    Period  Weeks    Status  On-going    Target Date  12/12/17      PT LONG TERM GOAL #4   Title  Pt. will ambulate 10 min. with normalized gait pattern with no increase c/o knee/foot pain.    Baseline  Pt. c/o >6/10 R knee/back/L foot pain with walking.  L antalgic gait pattern; 09/26/17: unchanged; 11/14/17 no antalgic gait but pt reports inability to walk 10 min pain-free    Time  4    Period  Weeks    Status  On-going    Target Date  12/12/17            Plan - 11/30/17 1334    Clinical Presentation  Unstable    Clinical Decision Making  High    Rehab Potential  Fair    PT Frequency  1x / week    PT Duration  4 weeks    PT Treatment/Interventions  ADLs/Self Care Home Management;Aquatic Therapy;Cryotherapy;Iontophoresis 4mg /ml Dexamethasone;Ultrasound;Patient/family education;Neuromuscular re-education;Balance training;Therapeutic exercise;Therapeutic activities;Functional mobility training;Stair training;Gait training;Manual techniques;Passive range of motion;Dry needling    PT Next Visit Plan  Progress HEP / balance.       PT Home Exercise Plan  See handouts.         Patient will benefit from skilled therapeutic intervention in order to improve the following deficits and impairments:  Abnormal gait, Decreased strength, Increased muscle spasms, Postural dysfunction, Impaired perceived functional ability, Decreased activity tolerance, Decreased mobility, Impaired flexibility, Obesity, Decreased range of  motion, Decreased balance, Pain, Decreased endurance  Visit Diagnosis: Pain in left foot  Gait difficulty  Muscle weakness (generalized)     Problem List Patient Active Problem List   Diagnosis Date Noted  .  Kidney stone 04/25/2017  . Ureteral stone      Cammie Mcgee, PT, DPT # [phone] Janee Morn, SPT 11/30/2017, 1:37 PM  [facility] [address] [place], [place], [address] Phone: [phone]   Fax:  [phone]  Name: Kelsey Bell MRN: [id number] Date of Birth: 19-Jul-1981

## 2017-11-29 ENCOUNTER — Telehealth: Payer: Self-pay | Admitting: Pharmacy Technician

## 2017-11-29 NOTE — Telephone Encounter (Signed)
Received updated proof of income.  Patient eligible to receive medication assistance at [facility] through 2019, as long as eligibility requirements continue to be met.  [facility]

## 2017-12-05 ENCOUNTER — Ambulatory Visit: Payer: Self-pay | Admitting: Physical Therapy

## 2017-12-05 ENCOUNTER — Other Ambulatory Visit: Payer: Self-pay

## 2017-12-05 ENCOUNTER — Emergency Department
Admission: EM | Admit: 2017-12-05 | Discharge: 2017-12-05 | Disposition: A | Discharge status: Home / Self Care | Payer: Self-pay | Attending: Emergency Medicine | Admitting: Emergency Medicine

## 2017-12-05 ENCOUNTER — Telehealth: Payer: Self-pay | Admitting: Pharmacist

## 2017-12-05 ENCOUNTER — Emergency Department: Payer: Self-pay

## 2017-12-05 DIAGNOSIS — J45909 Unspecified asthma, uncomplicated: Secondary | ICD-10-CM | POA: Insufficient documentation

## 2017-12-05 DIAGNOSIS — Z79899 Other long term (current) drug therapy: Secondary | ICD-10-CM | POA: Insufficient documentation

## 2017-12-05 DIAGNOSIS — N200 Calculus of kidney: Secondary | ICD-10-CM | POA: Insufficient documentation

## 2017-12-05 LAB — URINALYSIS, ROUTINE W REFLEX MICROSCOPIC
Bacteria, UA: NONE SEEN
Bilirubin Urine: NEGATIVE
Glucose, UA: NEGATIVE mg/dL
KETONES UR: 20 mg/dL — AB
Nitrite: NEGATIVE
PROTEIN: NEGATIVE mg/dL
Specific Gravity, Urine: 1.024 (ref 1.005–1.030)
pH: 5 (ref 5.0–8.0)

## 2017-12-05 LAB — POCT PREGNANCY, URINE: PREG TEST UR: NEGATIVE

## 2017-12-05 MED ORDER — OXYCODONE-ACETAMINOPHEN 7.5-325 MG PO TABS: 1.0000 | ORAL_TABLET | Freq: Four times a day (QID) | ORAL | 0 refills | Status: DC | PRN

## 2017-12-05 MED ORDER — ONDANSETRON 8 MG PO TBDP: 8.0000 mg | ORAL_TABLET | Freq: Once | ORAL | Status: DC

## 2017-12-05 MED ORDER — ONDANSETRON HCL 8 MG PO TABS: 8.0000 mg | ORAL_TABLET | Freq: Three times a day (TID) | ORAL | 0 refills | Status: DC | PRN

## 2017-12-05 MED ORDER — PROMETHAZINE HCL 25 MG PO TABS: 25.0000 mg | ORAL_TABLET | Freq: Four times a day (QID) | ORAL | 0 refills | Status: DC | PRN

## 2017-12-05 MED ORDER — OXYCODONE-ACETAMINOPHEN 5-325 MG PO TABS: 1.0000 | ORAL_TABLET | Freq: Once | ORAL | Status: DC

## 2017-12-05 NOTE — Telephone Encounter (Signed)
12/05/2017 10:08:36 AM - Advair 250/50 & Ventolin HFA refills  12/05/17 Called [facility] for refills on Advair 250/50 & Ventolin HFA, spoke with Darel HongJudy, she processed-I was unable to process online. Order# [id number].[name]

## 2017-12-05 NOTE — ED Triage Notes (Signed)
Pt reports that yesterday morning she woke up with left sided flank pain, she reports that [facility] but was not in his office. She reports that she is having nausea. She also reports that she was at the gym yesterday and felt that while working out she felt like she was going to pass out. Pt mucous membranes are pink moist and dry. She reports that she has a 5mm stone the last time and they had to do lithotripsy.

## 2017-12-05 NOTE — Discharge Instructions (Signed)
Increase water intake take medication as directed.

## 2017-12-05 NOTE — ED Notes (Signed)
Patient transported to CT, pt texting on phone as she is taken to CT.

## 2017-12-05 NOTE — ED Notes (Signed)
Pt taken to her POV in a wheelchair. All belongings are with her. VSS. NAD. RX given for her to fill because pt could not find a ride home. Discharge instructions, RX and follow up discussed.

## 2017-12-05 NOTE — ED Provider Notes (Signed)
[facility] Emergency Department Provider Note   ____________________________________________   First MD Initiated Contact with Patient 12/05/17 1353     (approximate)  I have reviewed the triage vital signs and the nursing notes.   HISTORY  Chief Complaint Back Pain and Nausea    HPI Kelsey Bell is a 37 y.o. female patient presents with left flank pain which occurred yesterday morning awakening.  Patient states there is nausea associated complaint.  Patient state has a history of a nonobstructing kidney stone in the left kidney which was seen on a previous CT scan 2 months ago.  Patient also states while at the gym yesterday she felt faint while performing physical training.  Patient described her pain as "aching".  Patient denies urinary, frequency,hesitancy or dysuria.  Patient denies vaginal discharge.  Patient denies fever.  Patient rates the pain as a 9/10.  No palliative measure except for Tylenol prior to arrival.  Past Medical History:  Diagnosis Date  . Allergy   . Anxiety   . Arthritis   . Asthma   . Breast pain present for several months   Bil LT >RT across of breasts  . Depression   . Kidney stones   . LGSIL on Pap smear of cervix 2007  . Migraine   . Neuromuscular disorder (HCC)    Complex regional pain syndrome and Fibromyalgia    Patient Active Problem List   Diagnosis Date Noted  . Kidney stone 04/25/2017  . Ureteral stone     Past Surgical History:  Procedure Laterality Date  . COLPOSCOPY  2017   neg bx  . EXTRACORPOREAL SHOCK WAVE LITHOTRIPSY Right 04/26/2017   Procedure: EXTRACORPOREAL SHOCK WAVE LITHOTRIPSY (ESWL);  Surgeon: Vanna Scotland, MD;  Location: [facility] ORS;  Service: Urology;  Laterality: Right;  . KNEE ARTHROSCOPY Right   . TONSILLECTOMY      Prior to Admission medications   Medication Sig Start Date End Date Taking? Authorizing Provider  acetaminophen (TYLENOL) 500 MG tablet Take 1,000 mg by mouth every  evening.     [provider]  albuterol (PROVENTIL HFA;VENTOLIN HFA) 108 (90 Base) MCG/ACT inhaler Inhale 1 puff into the lungs as needed for wheezing or shortness of breath.    Mertie Moores, MD  ALPRAZolam Prudy Feeler) 0.25 MG tablet Take 0.25 mg by mouth as needed for anxiety.    [provider]  benzonatate (TESSALON) 200 MG capsule Take one cap TID PRN cough 06/17/17   Lutricia Feil, PA-C  budesonide-formoterol [facility]) 160-4.5 MCG/ACT inhaler Inhale 2 puffs into the lungs 2 (two) times daily. Using as needed    Mertie Moores, MD  calcium carbonate (OS-CAL) 600 MG TABS tablet Take 600 mg by mouth daily.    [provider]  Cholecalciferol (VITAMIN D3) 5000 units TABS Take 10,000 Units by mouth daily.     [provider]  cyclobenzaprine (FLEXERIL) 5 MG tablet Take by mouth as needed.  08/10/16   [provider]  FLUoxetine (PROZAC) 40 MG capsule Take 80 mg by mouth daily.    [provider]  fluticasone (FLONASE) 50 MCG/ACT nasal spray Place 2 sprays into the nose 2 (two) times daily. 06/07/17   [provider]  HYDROcodone-acetaminophen (NORCO) 5-325 MG tablet Take 1 tablet by mouth every 6 (six) hours as needed for moderate pain. 04/26/17   Adrian Saran, MD  ipratropium-albuterol (DUONEB) 0.5-2.5 (3) MG/3ML SOLN Take 3 mLs by nebulization as needed.    Meredeth Ide,  Herbon E, MD  Lidocaine-Menthol 4-4 % PTCH Apply 1 patch topically every 12 (twelve) hours.    Sheran Luzamos, Richard, MD  montelukast (SINGULAIR) 10 MG tablet Take 10 mg by mouth at bedtime.    [provider]  Multiple Vitamin (MULTI-VITAMINS) TABS Take 1 tablet by mouth daily.    [provider]  ondansetron (ZOFRAN) 8 MG tablet Take 1 tablet (8 mg total) by mouth every 8 (eight) hours as needed for nausea or vomiting. 12/05/17   Joni ReiningSmith, Ronald K, PA-C  oseltamivir (TAMIFLU) 75 MG capsule Take 1 capsule (75 mg total) by mouth every 12 (twelve) hours. 06/17/17    Lutricia Feiloemer, William P, PA-C  oxyCODONE-acetaminophen (PERCOCET) 7.5-325 MG tablet Take 1 tablet by mouth every 6 (six) hours as needed for severe pain. 12/05/17   Joni ReiningSmith, Ronald K, PA-C  promethazine (PHENERGAN) 25 MG tablet Take 1 tablet (25 mg total) by mouth every 6 (six) hours as needed for nausea or vomiting. 06/06/17   Payton Mccallumonty, Orlando, MD  tamsulosin (FLOMAX) 0.4 MG CAPS capsule Take 1 capsule (0.4 mg total) by mouth daily. 05/17/17   Michiel CowboyMcGowan, Shannon A, PA-C  topiramate (TOPAMAX) 50 MG tablet Take 250 mg by mouth at bedtime.     [provider]    Allergies Cefdinir; Esomeprazole; Esomeprazole magnesium; Gabapentin; Hydroxychloroquine; Tapentadol; Nsaids; Sumatriptan succinate; Cortisone; Duloxetine; Prednisone; Amoxicillin; Amoxicillin-pot clavulanate; Reglan [metoclopramide]; Sulfa antibiotics; and Sulfasalazine  Family History  Adopted: Yes  Problem Relation Age of Onset  . Alcohol abuse Mother   . Depression Mother   . Drug abuse Mother   . Mental illness Mother   . Alcohol abuse Father   . Arthritis Father   . Asthma Father   . COPD Father   . Depression Father   . Diabetes Father   . Drug abuse Father   . Early death Father   . Heart disease Father   . Hyperlipidemia Father   . Hypertension Father   . Kidney disease Father   . Stroke Father   . Diabetes Brother   . Depression Brother   . Kidney cancer Neg Hx   . Bladder Cancer Neg Hx     Social History Social History   Tobacco Use  . Smoking status: Never Smoker  . Smokeless tobacco: Never Used  Substance Use Topics  . Alcohol use: No  . Drug use: No    Review of Systems Constitutional: No fever/chills Eyes: No visual changes. ENT: No sore throat. Cardiovascular: Denies chest pain. Respiratory: Denies shortness of breath. Gastrointestinal: No abdominal pain.  No nausea, no vomiting.  No diarrhea.  No constipation. Genitourinary: Negative for dysuria. Musculoskeletal: Left flank pain. Skin:  Negative for rash. Neurological: Negative for headaches, focal weakness or numbness. Psychiatric anxiety and depression: Allergic/Immunilogical: See extensive medication list for allergies. ____________________________________________   PHYSICAL EXAM:  VITAL SIGNS: ED Triage Vitals  Enc Vitals Group     BP 12/05/17 1434 128/73     Pulse Rate 12/05/17 1434 80     Resp 12/05/17 1434 20     Temp 12/05/17 1434 97.7 F (36.5 C)     Temp Source 12/05/17 1434 Oral     SpO2 12/05/17 1434 98 %     Weight 12/05/17 1435 270 lb (122.5 kg)     Height 12/05/17 1435 5\' 7"  (1.702 m)     Head Circumference --      Peak Flow --      Pain Score 12/05/17 1434 9  Pain Loc --      Pain Edu? --      Excl. in GC? --     Constitutional: Alert and oriented. Well appearing and in no acute distress.  Morbid obesity. Neck: No stridor.  Hematological/Lymphatic/Immunilogical: No cervical lymphadenopathy. Cardiovascular: Normal rate, regular rhythm. Grossly normal heart sounds.  Good peripheral circulation. Respiratory: Normal respiratory effort.  No retractions. Lungs CTAB. Gastrointestinal: Soft and nontender. No distention. No abdominal bruits.  Left CVA guarding with palpation.   Genitourinary: Deferred Musculoskeletal: No lower extremity tenderness nor edema.  No joint effusions. Neurologic:  Normal speech and language. No gross focal neurologic deficits are appreciated. No gait instability. Skin:  Skin is warm, dry and intact. No rash noted. Psychiatric: Mood and affect are normal. Speech and behavior are normal.  ____________________________________________   LABS (all labs ordered are listed, but only abnormal results are displayed)  Labs Reviewed  URINALYSIS, ROUTINE W REFLEX MICROSCOPIC - Abnormal; Notable for the following components:      Result Value   Color, Urine YELLOW (*)    APPearance HAZY (*)    Hgb urine dipstick MODERATE (*)    Ketones, ur 20 (*)    Leukocytes, UA TRACE  (*)    Squamous Epithelial / LPF 0-5 (*)    All other components within normal limits  POC URINE PREG, ED  POCT PREGNANCY, URINE   ____________________________________________  EKG   ____________________________________________  RADIOLOGY .  Radiologist 3 mm stone at the [facility].   Official radiology report(s): Ct Renal Stone Study  Result Date: 12/05/2017 CLINICAL DATA:  37 y/o  F; left-sided flank pain. EXAM: CT ABDOMEN AND PELVIS WITHOUT CONTRAST TECHNIQUE: Multidetector CT imaging of the abdomen and pelvis was performed following the standard protocol without IV contrast. COMPARISON:  10/26/2017 CT abdomen and pelvis. 06/14/2017 pelvic ultrasound. FINDINGS: Lower chest: No acute abnormality. Hepatobiliary: No focal liver abnormality is seen. No gallstones, gallbladder wall thickening, or biliary dilatation. Pancreas: Unremarkable. No pancreatic ductal dilatation or surrounding inflammatory changes. Spleen: Normal in size without focal abnormality. Adrenals/Urinary Tract: Mild left hydronephrosis with 3 mm stone at the left ureterovesicular junction. Punctate left kidney upper pole stone. Kidneys are otherwise unremarkable. Adrenal glands are normal. No right hydronephrosis. Stomach/Bowel: Stomach is within normal limits. Appendix appears normal. No evidence of bowel wall thickening, distention, or inflammatory changes. Vascular/Lymphatic: No significant vascular findings are present. No enlarged abdominal or pelvic lymph nodes. Reproductive: Normal uterus and right adnexa. Stable enlargement of left ovary. Other: No abdominal wall hernia or abnormality. No abdominopelvic ascites. Musculoskeletal: No acute or significant osseous findings. IMPRESSION: 1. Mild left hydronephrosis with 3 mm stone at the left ureterovesicular junction. 2. Punctate left kidney upper pole stone. 3. Stable enlargement of the left ovary. Follow-up ultrasound is recommended. Electronically Signed   By: Mitzi Hansen M.D.   On: 12/05/2017 15:15    ____________________________________________   PROCEDURES  Procedure(s) performed: None  Procedures  Critical Care performed: No  ____________________________________________   INITIAL IMPRESSION / ASSESSMENT AND PLAN / ED COURSE  As part of my medical decision making, I reviewed the following data within the electronic MEDICAL RECORD NUMBER    Left flank and left abdominal pain secondary to kidney stone.  Discussed CT results shortness stone is not at [facility].  Patient given discharge care instruction.  Patient given a prescription for Percocet and Zofran.  Patient advised to follow-up with urologist as needed.      ____________________________________________   FINAL CLINICAL IMPRESSION(S) /  ED DIAGNOSES  Final diagnoses:  Renal calculus, left     ED Discharge Orders        Ordered    oxyCODONE-acetaminophen (PERCOCET) 7.5-325 MG tablet  Every 6 hours PRN     12/05/17 1528    ondansetron (ZOFRAN) 8 MG tablet  Every 8 hours PRN     12/05/17 1528       Note:  This document was prepared using Dragon voice recognition software and may include unintentional dictation errors.    Joni Reining, PA-C 12/05/17 1532    Merrily Brittle, MD 12/05/17 [phone]

## 2017-12-12 ENCOUNTER — Ambulatory Visit
Payer: No Typology Code available for payment source | Attending: Physical Medicine and Rehabilitation | Admitting: Physical Therapy

## 2017-12-12 ENCOUNTER — Encounter: Payer: Self-pay | Admitting: Physical Therapy

## 2017-12-12 DIAGNOSIS — M79672 Pain in left foot: Secondary | ICD-10-CM | POA: Insufficient documentation

## 2017-12-12 DIAGNOSIS — M6281 Muscle weakness (generalized): Secondary | ICD-10-CM | POA: Insufficient documentation

## 2017-12-12 DIAGNOSIS — R269 Unspecified abnormalities of gait and mobility: Secondary | ICD-10-CM | POA: Insufficient documentation

## 2017-12-12 NOTE — Therapy (Addendum)
[facility] [address]. [place], [place], [address] Phone: [phone]   Fax:  [phone]  Physical Therapy Treatment  Patient Details  Name: Kelsey Bell MRN: [id number] Date of Birth: 1980/12/10 Referring Provider: Dr. Ethelene Hal   Encounter Date: 12/12/2017  PT End of Session - 12/12/17 1722    Visit Number  17    Number of Visits  18    Date for PT Re-Evaluation  12/12/17    PT Start Time  1345    PT Stop Time  1439    PT Time Calculation (min)  54 min    Activity Tolerance  Patient limited by pain;Patient tolerated treatment well    Behavior During Therapy  [facility] for tasks assessed/performed       Past Medical History:  Diagnosis Date  . Allergy   . Anxiety   . Arthritis   . Asthma   . Breast pain present for several months   Bil LT >RT across of breasts  . Depression   . Kidney stones   . LGSIL on Pap smear of cervix 2007  . Migraine   . Neuromuscular disorder (HCC)    Complex regional pain syndrome and Fibromyalgia    Past Surgical History:  Procedure Laterality Date  . COLPOSCOPY  2017   neg bx  . EXTRACORPOREAL SHOCK WAVE LITHOTRIPSY Right 04/26/2017   Procedure: EXTRACORPOREAL SHOCK WAVE LITHOTRIPSY (ESWL);  Surgeon: Vanna Scotland, MD;  Location: [facility] ORS;  Service: Urology;  Laterality: Right;  . KNEE ARTHROSCOPY Right   . TONSILLECTOMY      There were no vitals filed for this visit.  Subjective Assessment - 12/12/17 1715    Subjective  Patient presents with shoulder pain. Reports that foot pain worsened when medication discontinued to pass kidney stone; sensation of "spiders" reported, diminished with resumption of medication    Pertinent History  pt. known well to PT clinic.      Limitations  Standing;Walking;House hold activities;Other (comment)    Patient Stated Goals  Decrease knee/foot pain.  Improve walking    Currently in Pain?  Yes    Pain Score  7     Pain Location  Foot    Pain  Orientation  Left    Pain Descriptors / Indicators  Aching    Pain Type  Chronic pain    Pain Onset  More than a month ago    Pain Frequency  Constant    Multiple Pain Sites  Yes    Pain Location  Shoulder    Pain Orientation  Left    Pain Descriptors / Indicators  Aching    Pain Type  Acute pain       TREATMENT  Shoulder screen: Pain with abduction/flexion above 100 deg. but no restrictions in range. - Hawkins-Kennedy - Neer - Lift-off - Jobe Pt exquisitely tender at bicipital groove, muscle belly of supraspinatus Pain provoked with superior, anterior, inferior joint mobilizations Complains of feeling like shoulder will sublux at rest   Impression: presentation consistent with acute tendonitis in bicipital tendon, inflammation of supraspinatus with aggravation of symptoms due to central sensitization   Gait assessment: Patient demonstrates externally rotated LLE in gait but shows good weight acceptance and push-off over BLE  Manual therapy: Desensitization of LLE foot with manual/towel stroking x 3 min Triceps surae stretch 3 x 30 sec with concurrent posterior mobilization of talocrural joint  Therex: Rocking on inverted half-foam DF/PF x 20 Prostretch 3 x 30 sec  Sidelying shoulder external rotation (no resistance), added to HEP  Neuromuscular re-ed: BOSU step-ups over BLE 2 x 10 with min UE assist with therapist encouragement SLS 15 sec x 2; patient demonstrates reduced stability over LLE relative to RLE but is able to maintain SLS with min UE assist with therapist encouragement  Manual therapy: KT taping along biceps, over posterior and anterior deltoid to support shoulder, calm irritated tissues, decrease pain   PT Education - 12/12/17 1719    Education provided  Yes    Education Details  Shoulder screen, shoulder therex, gym program    Person(s) Educated  Patient    Methods  Explanation    Comprehension  Verbalized understanding          PT Long Term  Goals - 11/14/17 1719      PT LONG TERM GOAL #1   Title  Pt. I with HEP to increase L ankle AROM to WNL as compared to R ankle to improve pain-free mobility.      Baseline  L/R ankle AROM:  DF: -5/6 deg., PF: 55/67 deg., IV: 29/46 deg., EV: -19/16 deg.  (pain limited with L ankle DF and inversion), pt. reports 10+/10 L foot pain at worst with palpation/ DF;    09/26/16 L/R ankle AROM:  DF: -5/6 deg., PF: 62/67 deg., IV: 30/46 deg., EV: -5/16 deg.  (pain limited with L ankle EV and DF)- pt. reports 10+/10 L foot pain at worst; 11/14/17 L/R ankle AROM: DF: 4/6 deg., PF 64/67 deg., IV 30/46 deg., EV 4/16 deg., pt reports 6/10 pain    Time  4    Period  Weeks    Status  On-going    Target Date  12/12/17      PT LONG TERM GOAL #2   Title  Pt. will increase LEFS to >40 out of 80 to improve pain-free mobility/ return to work.      Baseline  LEFS: 21 out of 80 on 11/27; 09/26/17: 36/80; 11/14/17 37/80    Time  4    Period  Weeks    Status  On-going    Target Date  12/12/17      PT LONG TERM GOAL #3   Title  Pt. will increase B hip flexor/ quad. strengthening to 4+/5 MMT to improve standing tolerance/ walking with more normalized gait pattern.     Baseline  B LE muscle strength grossly 4/5 MMT (pain limited). 09/26/17: hip flexor 4/5, quad 4+/5 ; 11/14/17 hip flexor 4+/5, L quad 4/5, R quad 4+/5    Time  4    Period  Weeks    Status  On-going    Target Date  12/12/17      PT LONG TERM GOAL #4   Title  Pt. will ambulate 10 min. with normalized gait pattern with no increase c/o knee/foot pain.    Baseline  Pt. c/o >6/10 R knee/back/L foot pain with walking.  L antalgic gait pattern; 09/26/17: unchanged; 11/14/17 no antalgic gait but pt reports inability to walk 10 min pain-free    Time  4    Period  Weeks    Status  On-going    Target Date  12/12/17            Plan - 12/12/17 1723    Clinical Impression Statement  Shoulder screen suggests acute bicipital/supraspinatus tendonitis exacerbated by  central sensitization with possible mild rotator cuff involvement. LLE foot demonstrates reduced sensitivity. Ankle stability remains reduced relative to RLE with  SLS balance exercises but patient able to tolerate BOSU therex, SLS with minimal UE support. Patient will continue to benefit from skilled physical therapy to reduce sensitization of LLE foot and improve standing strength for return to PLOF.    Rehab Potential  Fair    PT Frequency  1x / week    PT Duration  4 weeks    PT Treatment/Interventions  ADLs/Self Care Home Management;Aquatic Therapy;Cryotherapy;Iontophoresis 4mg /ml Dexamethasone;Ultrasound;Patient/family education;Neuromuscular re-education;Balance training;Therapeutic exercise;Therapeutic activities;Functional mobility training;Stair training;Gait training;Manual techniques;Passive range of motion;Dry needling    PT Next Visit Plan  Progress HEP / balance.   RECERT NEXT TX SESSION (send to new MD)- see order (not Dr. Ethelene Hal).      PT Home Exercise Plan  See handouts.         Patient will benefit from skilled therapeutic intervention in order to improve the following deficits and impairments:  Abnormal gait, Decreased strength, Increased muscle spasms, Postural dysfunction, Impaired perceived functional ability, Decreased activity tolerance, Decreased mobility, Impaired flexibility, Obesity, Decreased range of motion, Decreased balance, Pain, Decreased endurance  Visit Diagnosis: Pain in left foot  Gait difficulty  Muscle weakness (generalized)     Problem List Patient Active Problem List   Diagnosis Date Noted  . Kidney stone 04/25/2017  . Ureteral stone    Cammie Mcgee, PT, DPT # [phone] 12/12/2017, 5:56 PM  [facility] [place] [address] [place], [place], [address] Phone: [phone]   Fax:  [phone]  Name: Kelsey Bell MRN: [id number] Date of Birth: [birth year]

## 2017-12-19 ENCOUNTER — Ambulatory Visit: Payer: No Typology Code available for payment source | Admitting: Physical Therapy

## 2017-12-19 DIAGNOSIS — M6281 Muscle weakness (generalized): Secondary | ICD-10-CM

## 2017-12-19 DIAGNOSIS — M79672 Pain in left foot: Secondary | ICD-10-CM

## 2017-12-19 DIAGNOSIS — R269 Unspecified abnormalities of gait and mobility: Secondary | ICD-10-CM

## 2017-12-19 NOTE — Therapy (Addendum)
[facility] [address] [place], [place], [address] Phone: [phone]   Fax:  [phone]  Physical Therapy Treatment  Patient Details  Name: Kelsey Bell MRN: [id number] Date of Birth: 06/03/1981 Referring Provider: Dr. Ethelene Halamos   Encounter Date: 12/19/2017  PT End of Session - 12/21/17 0843    Visit Number  18    Number of Visits  22    Date for PT Re-Evaluation  01/16/18    PT Start Time  1529    PT Stop Time  1559    PT Time Calculation (min)  30 min    Activity Tolerance  Patient limited by pain;Patient tolerated treatment well    Behavior During Therapy  [facility] for tasks assessed/performed       Past Medical History:  Diagnosis Date  . Allergy   . Anxiety   . Arthritis   . Asthma   . Breast pain present for several months   Bil LT >RT across of breasts  . Depression   . Kidney stones   . LGSIL on Pap smear of cervix 2007  . Migraine   . Neuromuscular disorder (HCC)    Complex regional pain syndrome and Fibromyalgia    Past Surgical History:  Procedure Laterality Date  . COLPOSCOPY  2017   neg bx  . EXTRACORPOREAL SHOCK WAVE LITHOTRIPSY Right 04/26/2017   Procedure: EXTRACORPOREAL SHOCK WAVE LITHOTRIPSY (ESWL);  Surgeon: Vanna ScotlandBrandon, Ashley, MD;  Location: [facility] ORS;  Service: Urology;  Laterality: Right;  . KNEE ARTHROSCOPY Right   . TONSILLECTOMY      There were no vitals filed for this visit.  Subjective Assessment - 12/19/17 1607    Subjective  Patient reports that ankle and BLE have been swollen. BLE knees are painful with gait. Ankle feels like it has "beestings". Shoulder pain has resolved.    Pertinent History  pt. known well to PT clinic.      Limitations  Standing;Walking;House hold activities;Other (comment)    Patient Stated Goals  Decrease knee/foot pain.  Improve walking    Pain Score  6     Pain Location  Foot    Pain Orientation  Left    Pain Descriptors / Indicators  Aching    Pain Type   Chronic pain    Pain Onset  More than a month ago       TREATMENT  Goals assessment: Ankle AROM (in deg) L/R DF: 0/6 PF: 65/70 IV: 36/40 EV: 16/14   LEFS 40/80 (goal >40)  Hip flexor/quad strength grossly 5/5 except LLE quads not tested secondary to knee pain.  Pt cannot ambulate for 10 min without increase in knee/foot pain.  Pt complains of "ankle stiffness"; talocrural distraction neutral and with bias in inversion/eversion 2 x 15 sec bouts each. Patient reports reduced stiffness following manual therapy, "I feel like this is what it needs."  Pt complains of BLE knee pain with knee flexion medial and inferior to patella. BLE patellas assessed and found to be tilted and rotated medially. Focal tightness/tenderness in adductors noted. Impression: patellar maltracking secondary to quad imbalance and tightness in adductors.  Lateral patellar mobilization x 4 min over BLE knees. Patient instructed in adductor towel stretch. Patient advised to continue using desensitization brush. Provided education regarding the impact of external stressors on perception of pain; negative impact of pain-focused thought.    PT Education - 12/21/17 0843    Education Details  HEP; plan of care  Person(s) Educated  Patient    Methods  Explanation    Comprehension  Verbalized understanding          PT Long Term Goals - 12/19/17 1715      PT LONG TERM GOAL #1   Title  Pt. I with HEP to increase L ankle AROM to WNL as compared to R ankle to improve pain-free mobility.      Baseline  L/R ankle AROM:  DF: -5/6 deg., PF: 55/67 deg., IV: 29/46 deg., EV: -19/16 deg.  (pain limited with L ankle DF and inversion), pt. reports 10+/10 L foot pain at worst with palpation/ DF;    09/26/16 L/R ankle AROM:  DF: -5/6 deg., PF: 62/67 deg., IV: 30/46 deg., EV: -5/16 deg.  (pain limited with L ankle EV and DF)- pt. reports 10+/10 L foot pain at worst; 11/14/17 L/R ankle AROM: DF: 4/6 deg., PF 64/67 deg., IV 30/46  deg., EV 4/16 deg., pt reports 6/10 pain; 12/19/17 L/R ankle AROM: DF 0/6 deg., PF 65/70 deg., IV 36/40 deg., EV 16/14 deg. Pt reports 6/10 pain.    Time  4    Period  Weeks    Status  On-going      PT LONG TERM GOAL #2   Title  Pt. will increase LEFS to >40 out of 80 to improve pain-free mobility/ return to work.      Baseline  LEFS: 21 out of 80 on 11/27; 09/26/17: 36/80; 11/14/17 37/80; 12/19/17 40/80    Time  4    Period  Weeks    Status  On-going      PT LONG TERM GOAL #3   Title  Pt. will increase B hip flexor/ quad. strengthening to 4+/5 MMT to improve standing tolerance/ walking with more normalized gait pattern.     Baseline  B LE muscle strength grossly 4/5 MMT (pain limited). 09/26/17: hip flexor 4/5, quad 4+/5 ; 11/14/17 hip flexor 4+/5, L quad 4/5, R quad 4+/5; 12/19/17 5/5 except L quad pain limited    Time  4    Period  Weeks    Status  On-going      PT LONG TERM GOAL #4   Title  Pt. will ambulate 10 min. with normalized gait pattern with no increase c/o knee/foot pain.    Baseline  Pt. c/o >6/10 R knee/back/L foot pain with walking.  L antalgic gait pattern; 09/26/17: unchanged; 11/14/17 no antalgic gait but pt reports inability to walk 10 min pain-free; 12/19/17 no antalgic gait but not able to walk 10 min pain-free    Time  4    Period  Weeks    Status  On-going            Plan - 12/21/17 0843    Clinical Impression Statement  LLE foot remains sensitized. LLE ankle AROM within 5 deg. of RLE. Patient reports pain with BLE knees with gait medial and inferior to patella. LLE patella medially tilted; focal tenderness on medial aspect of LLE thigh consistent with tightness in adductors. Pt responds well to patellar mobilizaiton and adductor stretch. Focused on desensitization of LLE foot. Patient demonstrates progress towards goals but will continue to benefit form skilled physical therapy to reduce knee and foot pain for return to PLOF.    Rehab Potential  Fair    PT Frequency   1x / week    PT Duration  4 weeks    PT Treatment/Interventions  ADLs/Self Care Home Management;Aquatic Therapy;Cryotherapy;Iontophoresis 4mg /ml Dexamethasone;Ultrasound;Patient/family  education;Neuromuscular re-education;Balance training;Therapeutic exercise;Therapeutic activities;Functional mobility training;Stair training;Gait training;Manual techniques;Passive range of motion;Dry needling    PT Next Visit Plan  Manual work for knee/adductors; HEP    PT Home Exercise Plan  See handouts.      Consulted and Agree with Plan of Care  Patient       Patient will benefit from skilled therapeutic intervention in order to improve the following deficits and impairments:  Abnormal gait, Decreased strength, Increased muscle spasms, Postural dysfunction, Impaired perceived functional ability, Decreased activity tolerance, Decreased mobility, Impaired flexibility, Obesity, Decreased range of motion, Decreased balance, Pain, Decreased endurance  Visit Diagnosis: Pain in left foot  Gait difficulty  Muscle weakness (generalized)     Problem List Patient Active Problem List   Diagnosis Date Noted  . Kidney stone 04/25/2017  . Ureteral stone    Janee Morn, SPT 12/19/2017, 5:53 PM   This entire session was performed under direct supervision and direction of a licensed therapist/therapist assistant . I have personally read, edited and approve of the note as written. Encarnacion Chu PT, DPT  [facility] [address] [place], [place], [address] Phone: [phone]   Fax:  [phone]  Name: Kelsey Bell MRN: [id number] Date of Birth: 10-26-1980

## 2017-12-21 ENCOUNTER — Encounter: Payer: Self-pay | Admitting: Physical Therapy

## 2017-12-26 ENCOUNTER — Encounter: Payer: No Typology Code available for payment source | Admitting: Physical Therapy

## 2017-12-28 ENCOUNTER — Ambulatory Visit: Payer: No Typology Code available for payment source | Admitting: Physical Therapy

## 2018-01-02 ENCOUNTER — Encounter: Payer: Self-pay | Admitting: Physical Therapy

## 2018-01-02 ENCOUNTER — Ambulatory Visit
Payer: No Typology Code available for payment source | Attending: Physical Medicine and Rehabilitation | Admitting: Physical Therapy

## 2018-01-02 DIAGNOSIS — M25562 Pain in left knee: Secondary | ICD-10-CM | POA: Insufficient documentation

## 2018-01-02 DIAGNOSIS — M25561 Pain in right knee: Secondary | ICD-10-CM | POA: Insufficient documentation

## 2018-01-02 DIAGNOSIS — G8929 Other chronic pain: Secondary | ICD-10-CM | POA: Insufficient documentation

## 2018-01-02 DIAGNOSIS — R262 Difficulty in walking, not elsewhere classified: Secondary | ICD-10-CM | POA: Insufficient documentation

## 2018-01-02 DIAGNOSIS — R269 Unspecified abnormalities of gait and mobility: Secondary | ICD-10-CM

## 2018-01-02 DIAGNOSIS — M79672 Pain in left foot: Secondary | ICD-10-CM | POA: Insufficient documentation

## 2018-01-02 DIAGNOSIS — M25572 Pain in left ankle and joints of left foot: Secondary | ICD-10-CM | POA: Insufficient documentation

## 2018-01-02 DIAGNOSIS — M6281 Muscle weakness (generalized): Secondary | ICD-10-CM | POA: Insufficient documentation

## 2018-01-02 NOTE — Therapy (Signed)
[place] [facility] [address] [place], [place], [address] Phone: [phone]   Fax:  [phone]  Physical Therapy Treatment  Patient Details  Name: Kelsey Bell MRN: [id number] Date of Birth: 10/29/1980 Referring Provider: Dr. Ethelene Hal   Encounter Date: 01/02/2018  PT End of Session - 01/02/18 1809    Visit Number  19    Number of Visits  22    Date for PT Re-Evaluation  01/16/18    PT Start Time  1531    PT Stop Time  1610    PT Time Calculation (min)  39 min    Activity Tolerance  Patient limited by pain;Patient tolerated treatment well    Behavior During Therapy  [facility] for tasks assessed/performed       Past Medical History:  Diagnosis Date  . Allergy   . Anxiety   . Arthritis   . Asthma   . Breast pain present for several months   Bil LT >RT across of breasts  . Depression   . Kidney stones   . LGSIL on Pap smear of cervix 2007  . Migraine   . Neuromuscular disorder (HCC)    Complex regional pain syndrome and Fibromyalgia    Past Surgical History:  Procedure Laterality Date  . COLPOSCOPY  2017   neg bx  . EXTRACORPOREAL SHOCK WAVE LITHOTRIPSY Right 04/26/2017   Procedure: EXTRACORPOREAL SHOCK WAVE LITHOTRIPSY (ESWL);  Surgeon: Vanna Scotland, MD;  Location: [facility] ORS;  Service: Urology;  Laterality: Right;  . KNEE ARTHROSCOPY Right   . TONSILLECTOMY      There were no vitals filed for this visit.  Subjective Assessment - 01/02/18 1647    Subjective  Patient has cut topamax dose in half while kidney stone medication is stabilizing with worsening of symptoms in L foot. Patient complains of pain in BLE knees and "SI" since moving boxes in attic earlier this week.    Pertinent History  pt. known well to PT clinic.      Limitations  Standing;Walking;House hold activities;Other (comment)    Patient Stated Goals  Decrease knee/foot pain.  Improve walking    Currently in Pain?  Yes    Pain Score  6     Pain Location  Foot     Pain Orientation  Left    Pain Descriptors / Indicators  Aching    Pain Type  Chronic pain    Pain Onset  More than a month ago    Pain Frequency  Constant    Multiple Pain Sites  Yes    Pain Score  5    Pain Location  Knee    Pain Orientation  Right;Left    Pain Descriptors / Indicators  Sharp    Pain Type  Chronic pain    Pain Score  6    Pain Location  Back    Pain Orientation  Posterior;Lower    Pain Descriptors / Indicators  Aching        TREATMENT  Ambulation 200 ft; patient demonstrates reduced hyperextension of knee and no antalgic gait but valgus collapse through knees remains an issue.  Supine Edema massage over ankle x 3 min Adductor stretch in supine 3 x 30 sec over BLE Patellar mobilizations over BLE all directions x 30 sec each HS stretch over LLE following complaint of cramping 2 x 30 sec   Prone Patient exquisitely TTP over B lumbar paraspinals, sacrum, iliac crest, PSIS. Gentle stretching to B QL 3  x 1 min   Therapeutic exercise: "Pelvic shotgun" - hooklying isometric hip adduction, abduction 2 x 5 sec each to open SI joint - resulted in improvement of symptoms to 1/10 pain. Supine SLR in ER over BLE 2 x 10 with nonworking leg in hookying to support low back - patient reported difficulty but was able to complete exercise Seated: Knee extension with ball between heels in hip external rotation to strengthen VMO 2 x 10 Knee extension with ball between knees in hip internal rotation 2 x 10   Ambulation 200 ft  Patient reported reduced knee pain with flexion following therapeutic exercises  Supine SLR in ER added to HEP    PT Long Term Goals - 12/19/17 1715      PT LONG TERM GOAL #1   Title  Pt. I with HEP to increase L ankle AROM to WNL as compared to R ankle to improve pain-free mobility.      Baseline  L/R ankle AROM:  DF: -5/6 deg., PF: 55/67 deg., IV: 29/46 deg., EV: -19/16 deg.  (pain limited with L ankle DF and inversion), pt. reports 10+/10  L foot pain at worst with palpation/ DF;    09/26/16 L/R ankle AROM:  DF: -5/6 deg., PF: 62/67 deg., IV: 30/46 deg., EV: -5/16 deg.  (pain limited with L ankle EV and DF)- pt. reports 10+/10 L foot pain at worst; 11/14/17 L/R ankle AROM: DF: 4/6 deg., PF 64/67 deg., IV 30/46 deg., EV 4/16 deg., pt reports 6/10 pain; 12/19/17 L/R ankle AROM: DF 0/6 deg., PF 65/70 deg., IV 36/40 deg., EV 16/14 deg. Pt reports 6/10 pain.    Time  4    Period  Weeks    Status  On-going      PT LONG TERM GOAL #2   Title  Pt. will increase LEFS to >40 out of 80 to improve pain-free mobility/ return to work.      Baseline  LEFS: 21 out of 80 on 11/27; 09/26/17: 36/80; 11/14/17 37/80; 12/19/17 40/80    Time  4    Period  Weeks    Status  On-going      PT LONG TERM GOAL #3   Title  Pt. will increase B hip flexor/ quad. strengthening to 4+/5 MMT to improve standing tolerance/ walking with more normalized gait pattern.     Baseline  B LE muscle strength grossly 4/5 MMT (pain limited). 09/26/17: hip flexor 4/5, quad 4+/5 ; 11/14/17 hip flexor 4+/5, L quad 4/5, R quad 4+/5; 12/19/17 5/5 except L quad pain limited    Time  4    Period  Weeks    Status  On-going      PT LONG TERM GOAL #4   Title  Pt. will ambulate 10 min. with normalized gait pattern with no increase c/o knee/foot pain.    Baseline  Pt. c/o >6/10 R knee/back/L foot pain with walking.  L antalgic gait pattern; 09/26/17: unchanged; 11/14/17 no antalgic gait but pt reports inability to walk 10 min pain-free; 12/19/17 no antalgic gait but not able to walk 10 min pain-free    Time  4    Period  Weeks    Status  On-going            Plan - 01/02/18 1811    Clinical Impression Statement  Patient demonstrates less sensitivity in LLE foot and ankle. Knee pain consistent with patellar tracking dysfunction; lateral patellar tilt observed in full extension over BLE. ITB demonstrates  mild tightness but palpation suggests quad imbalance with VMO weakness; patient responds  well to VMO strengthening exercises. Back/sacroilliac pain responds well to "pelvic shotgun" therex reducing pain to 1/10. Patient will continue to benefit from skilled physical therapy to reduce pain and return to PLOF.    Rehab Potential  Fair    PT Frequency  1x / week    PT Duration  4 weeks    PT Treatment/Interventions  ADLs/Self Care Home Management;Aquatic Therapy;Cryotherapy;Iontophoresis /ml Dexamethasone;Ultrasound;Patient/family education;Neuromuscular re-education;Balance training;Therapeutic exercise;Therapeutic activities;Functional mobility training;Stair training;Gait training;Manual techniques;Passive range of motion;Dry needling    PT Next Visit Plan  VMO strengthening; manual work on adductors; advance HEP; ankle stability therex    PT Home Exercise Plan  See handouts.      Consulted and Agree with Plan of Care  Patient       Patient will benefit from skilled therapeutic intervention in order to improve the following deficits and impairments:  Abnormal gait, Decreased strength, Increased muscle spasms, Postural dysfunction, Impaired perceived functional ability, Decreased activity tolerance, Decreased mobility, Impaired flexibility, Obesity, Decreased range of motion, Decreased balance, Pain, Decreased endurance  Visit Diagnosis: Pain in left foot  Gait difficulty  Muscle weakness (generalized)  Chronic pain of right knee  Chronic pain of left knee  Pain in left ankle and joints of left foot  Difficulty in walking, not elsewhere classified     Problem List Patient Active Problem List   Diagnosis Date Noted  . Kidney stone 04/25/2017  . Ureteral stone     Cammie Mcgee, PT, DPT # [phone] Janee Morn, SPT 01/02/2018, 6:29 PM  [place] [facility] [address] [place], [place], [address] Phone: [phone]   Fax:  [phone]  Name: Kelsey Bell MRN: [id number] Date of Birth: 04-29-1981

## 2018-01-09 ENCOUNTER — Ambulatory Visit: Payer: No Typology Code available for payment source | Admitting: Physical Therapy

## 2018-01-09 ENCOUNTER — Encounter: Payer: Self-pay | Admitting: Physical Therapy

## 2018-01-09 ENCOUNTER — Encounter: Payer: Self-pay | Admitting: Physical Medicine & Rehabilitation

## 2018-01-09 DIAGNOSIS — M25562 Pain in left knee: Secondary | ICD-10-CM

## 2018-01-09 DIAGNOSIS — M79672 Pain in left foot: Secondary | ICD-10-CM

## 2018-01-09 DIAGNOSIS — M6281 Muscle weakness (generalized): Secondary | ICD-10-CM

## 2018-01-09 DIAGNOSIS — G8929 Other chronic pain: Secondary | ICD-10-CM

## 2018-01-09 DIAGNOSIS — M25561 Pain in right knee: Secondary | ICD-10-CM

## 2018-01-09 DIAGNOSIS — R269 Unspecified abnormalities of gait and mobility: Secondary | ICD-10-CM

## 2018-01-09 NOTE — Therapy (Addendum)
[facility] [address] [place], [place], [address] Phone: [phone]   Fax:  [phone]  Physical Therapy Treatment  Patient Details  Name: Kelsey Bell MRN: [id number] Date of Birth: 1981/08/31 Referring Provider: Dr. Ethelene Hal   Encounter Date: 01/09/2018  PT End of Session - 01/09/18 1851    Visit Number  20    Number of Visits  22    Date for PT Re-Evaluation  01/16/18    PT Start Time  1521    PT Stop Time  1604    PT Time Calculation (min)  43 min    Activity Tolerance  Patient limited by pain;Patient tolerated treatment well    Behavior During Therapy  [facility] for tasks assessed/performed       Past Medical History:  Diagnosis Date  . Allergy   . Anxiety   . Arthritis   . Asthma   . Breast pain present for several months   Bil LT >RT across of breasts  . Depression   . Kidney stones   . LGSIL on Pap smear of cervix 2007  . Migraine   . Neuromuscular disorder (HCC)    Complex regional pain syndrome and Fibromyalgia    Past Surgical History:  Procedure Laterality Date  . COLPOSCOPY  2017   neg bx  . EXTRACORPOREAL SHOCK WAVE LITHOTRIPSY Right 04/26/2017   Procedure: EXTRACORPOREAL SHOCK WAVE LITHOTRIPSY (ESWL);  Surgeon: Vanna Scotland, MD;  Location: [facility] ORS;  Service: Urology;  Laterality: Right;  . KNEE ARTHROSCOPY Right   . TONSILLECTOMY      There were no vitals filed for this visit.  Subjective Assessment - 01/09/18 1849    Subjective  Pt has returned to normal topamax dose. Patient TTP over quads following extensive yard work over weekend.    Pertinent History  pt. known well to PT clinic.      Limitations  Standing;Walking;House hold activities;Other (comment)    Patient Stated Goals  Decrease knee/foot pain.  Improve walking    Currently in Pain?  Yes    Pain Score  5     Pain Location  Foot    Pain Orientation  Left    Pain Descriptors / Indicators  Aching    Pain Type  Chronic pain    Pain  Onset  More than a month ago    Pain Frequency  Constant    Multiple Pain Sites  No       TREATMENT  Supine Desensitization over foot x 3 min with proximal stroking, use of nails; patient responded well. Patient exquisitely tender to palpation over BLE quadriceps at musculotendinous junction Myofascial release to BLE quadriceps with focus on vastus lateralis x 15 min Adductor stretch in supine 3 x 30 sec over BLE Patellar mobilizations over BLE all directions x 30 sec each   Seated: Knee extension with ball between heels in hip external rotation to strengthen VMO 2 x 10 Knee extension with ball between knees in hip internal rotation 2 x 10  Supine SLR in ER over BLE 2 x 10 Quad stretch 3 x 30 sec over BLE   Scifit L6 10 min. At end of tx. session      PT Education - 01/09/18 1851    Education provided  Yes    Education Details  Quad stretch, appropriate activity limitations, pathophysiology of pain    Person(s) Educated  Patient    Methods  Explanation    Comprehension  Verbalized  understanding          PT Long Term Goals - 12/19/17 1715      PT LONG TERM GOAL #1   Title  Pt. I with HEP to increase L ankle AROM to WNL as compared to R ankle to improve pain-free mobility.      Baseline  L/R ankle AROM:  DF: -5/6 deg., PF: 55/67 deg., IV: 29/46 deg., EV: -19/16 deg.  (pain limited with L ankle DF and inversion), pt. reports 10+/10 L foot pain at worst with palpation/ DF;    09/26/16 L/R ankle AROM:  DF: -5/6 deg., PF: 62/67 deg., IV: 30/46 deg., EV: -5/16 deg.  (pain limited with L ankle EV and DF)- pt. reports 10+/10 L foot pain at worst; 11/14/17 L/R ankle AROM: DF: 4/6 deg., PF 64/67 deg., IV 30/46 deg., EV 4/16 deg., pt reports 6/10 pain; 12/19/17 L/R ankle AROM: DF 0/6 deg., PF 65/70 deg., IV 36/40 deg., EV 16/14 deg. Pt reports 6/10 pain.    Time  4    Period  Weeks    Status  On-going      PT LONG TERM GOAL #2   Title  Pt. will increase LEFS to >40 out of 80 to  improve pain-free mobility/ return to work.      Baseline  LEFS: 21 out of 80 on 11/27; 09/26/17: 36/80; 11/14/17 37/80; 12/19/17 40/80    Time  4    Period  Weeks    Status  On-going      PT LONG TERM GOAL #3   Title  Pt. will increase B hip flexor/ quad. strengthening to 4+/5 MMT to improve standing tolerance/ walking with more normalized gait pattern.     Baseline  B LE muscle strength grossly 4/5 MMT (pain limited). 09/26/17: hip flexor 4/5, quad 4+/5 ; 11/14/17 hip flexor 4+/5, L quad 4/5, R quad 4+/5; 12/19/17 5/5 except L quad pain limited    Time  4    Period  Weeks    Status  On-going      PT LONG TERM GOAL #4   Title  Pt. will ambulate 10 min. with normalized gait pattern with no increase c/o knee/foot pain.    Baseline  Pt. c/o >6/10 R knee/back/L foot pain with walking.  L antalgic gait pattern; 09/26/17: unchanged; 11/14/17 no antalgic gait but pt reports inability to walk 10 min pain-free; 12/19/17 no antalgic gait but not able to walk 10 min pain-free    Time  4    Period  Weeks    Status  On-going            Plan - 01/09/18 1853    Clinical Impression Statement  Patient reports tenderness to palpation over BLE quadriceps, adductors at the musculotendinous junction. Suggests overworking of quadriceps following yard work this weekend. Responds well to manual therapy, stretching, and therex. Foot sensitization remains but appears to PT to be somewhat reduced with pt able to tolerate increased stimulus without response. Patient will continue to benefit from skilled physical therapy to reduce pain and return to PLOF.    Rehab Potential  Fair    PT Frequency  1x / week    PT Duration  4 weeks    PT Treatment/Interventions  ADLs/Self Care Home Management;Aquatic Therapy;Cryotherapy;Iontophoresis /ml Dexamethasone;Ultrasound;Patient/family education;Neuromuscular re-education;Balance training;Therapeutic exercise;Therapeutic activities;Functional mobility training;Stair  training;Gait training;Manual techniques;Passive range of motion;Dry needling    PT Next Visit Plan  VMO strengthening; manual work on adductors; advance HEP; ankle  stability therex.  CHECK GOALS NEXT TX/ END OF CERT.      PT Home Exercise Plan  See handouts.      Consulted and Agree with Plan of Care  Patient       Patient will benefit from skilled therapeutic intervention in order to improve the following deficits and impairments:  Abnormal gait, Decreased strength, Increased muscle spasms, Postural dysfunction, Impaired perceived functional ability, Decreased activity tolerance, Decreased mobility, Impaired flexibility, Obesity, Decreased range of motion, Decreased balance, Pain, Decreased endurance  Visit Diagnosis: Pain in left foot  Gait difficulty  Muscle weakness (generalized)  Chronic pain of right knee  Chronic pain of left knee     Problem List Patient Active Problem List   Diagnosis Date Noted  . Kidney stone 04/25/2017  . Ureteral stone    Cammie Mcgee, PT, DPT # [id number] Janee Morn, SPT 01/10/2018, 7:39 AM  [place] [facility] [address] [place], [place], [address] Phone: [phone]   Fax:  [phone]  Name: Kelsey Bell MRN: [id number] Date of Birth: 1981/08/23

## 2018-01-16 ENCOUNTER — Ambulatory Visit: Payer: No Typology Code available for payment source | Admitting: Physical Therapy

## 2018-01-16 ENCOUNTER — Encounter: Payer: Self-pay | Admitting: Physical Therapy

## 2018-01-16 DIAGNOSIS — M25572 Pain in left ankle and joints of left foot: Secondary | ICD-10-CM

## 2018-01-16 DIAGNOSIS — R269 Unspecified abnormalities of gait and mobility: Secondary | ICD-10-CM

## 2018-01-16 DIAGNOSIS — M25561 Pain in right knee: Secondary | ICD-10-CM

## 2018-01-16 DIAGNOSIS — G8929 Other chronic pain: Secondary | ICD-10-CM

## 2018-01-16 DIAGNOSIS — M6281 Muscle weakness (generalized): Secondary | ICD-10-CM

## 2018-01-16 DIAGNOSIS — M79672 Pain in left foot: Secondary | ICD-10-CM

## 2018-01-16 DIAGNOSIS — M25562 Pain in left knee: Secondary | ICD-10-CM

## 2018-01-16 NOTE — Therapy (Addendum)
[place] [facility] [address] [place], [place], [address] Phone: [phone]   Fax:  [phone]  Physical Therapy Treatment  Patient Details  Name: Kelsey Bell MRN: [id number] Date of Birth: 17-Dec-1980 Referring Provider: Dr. Nelva Bush   Encounter Date: 01/16/2018  PT End of Session - 01/16/18 1814    Visit Number  21    Number of Visits  25    Date for PT Re-Evaluation  03/13/18    PT Start Time  1432    PT Stop Time  1523    PT Time Calculation (min)  51 min    Activity Tolerance  Patient tolerated treatment well    Behavior During Therapy  [facility] for tasks assessed/performed       Past Medical History:  Diagnosis Date  . Allergy   . Anxiety   . Arthritis   . Asthma   . Breast pain present for several months   Bil LT >RT across of breasts  . Depression   . Kidney stones   . LGSIL on Pap smear of cervix 2007  . Migraine   . Neuromuscular disorder ([place])    Complex regional pain syndrome and Fibromyalgia    Past Surgical History:  Procedure Laterality Date  . COLPOSCOPY  2017   neg bx  . EXTRACORPOREAL SHOCK WAVE LITHOTRIPSY Right 04/26/2017   Procedure: EXTRACORPOREAL SHOCK WAVE LITHOTRIPSY (ESWL);  Surgeon: Hollice Espy, MD;  Location: [facility] ORS;  Service: Urology;  Laterality: Right;  . KNEE ARTHROSCOPY Right   . TONSILLECTOMY      There were no vitals filed for this visit.  Subjective Assessment - 01/16/18 1812    Subjective  Patient reports not going to gym following various other stressors. Pain in foot diminishing slightly with brushing. No pain in L knee, reduced pain in R knee.    Pertinent History  pt. known well to PT clinic.      Limitations  Standing;Walking;House hold activities;Other (comment)    Patient Stated Goals  Decrease knee/foot pain.  Improve walking    Currently in Pain?  Yes    Pain Score  5     Pain Location  Foot    Pain Orientation  Left    Pain Descriptors / Indicators  Aching    Pain  Type  Chronic pain    Pain Onset  More than a month ago    Pain Frequency  Constant    Multiple Pain Sites  Yes    Pain Score  4    Pain Location  Knee    Pain Orientation  Right    Pain Descriptors / Indicators  Sharp    Pain Type  Chronic pain       TREATMENT  Warm-up: SCIFIT lv 6 x 10 min (unbiled)  Manual therapy: Desensitization over foot x 34mn with broad proximal-to-distal stroking, use of nails, towel; patient responded well reporting "weird" crawling sensation but less pain and hypersensitivity.  Patient reported pain in lateral L ankle, feeling "tight" in plantar surface. Talocrural distraction 3 x 30 sec with inversion moment to gap lateral ankle Myofascial release to intrinsic foot musculature (over plantar surface) x 5 min Calcaneal mobilization grade II-III 3 bouts of 20 sec Patient reported resolution of pain and demonstrated good ROM in long-sitting.  Assessed BLE patellae: LLE patella demonstrates mobility WNL; RLE patella rotated laterally BLE adductor stretch 2 x 30 sec - cues not to externally rotate at hip  Therapeutic Exercise: Bridging  3 x 10 with lift under RLE to reduce load, improve activation through LLE. Last set with arms crossed across chest to increase load, stabilization through glutes. Added to HEP.  Patient reported reduction of pain in RLE knee following treatment.         PT Education - 01/16/18 1813    Education provided  Yes    Education Details  HEP    Person(s) Educated  Patient    Methods  Explanation;Verbal cues;Tactile cues    Comprehension  Verbalized understanding;Verbal cues required;Tactile cues required          PT Long Term Goals - 01/16/18 2046      PT LONG TERM GOAL #1   Title  Pt. I with HEP to increase L ankle AROM to WNL as compared to R ankle to improve pain-free mobility.      Baseline  B ankle AROM WFL (pain focus with L ankle).      Time  4    Period  Weeks    Status  Achieved    Target Date   01/16/18      PT LONG TERM GOAL #2   Title  Pt. will increase LEFS to >40 out of 80 to improve pain-free mobility/ return to work.      Baseline  LEFS: 21 out of 80 on 11/27; 09/26/17: 36/80; 11/14/17 37/80; 12/19/17 40/80.   On 5/15: 34 (varying fluctuations do to pain).      Time  8    Period  Weeks    Status  Not Met    Target Date  03/13/18      PT LONG TERM GOAL #3   Title  Pt. will increase B hip flexor/ quad. strengthening to 4+/5 MMT to improve standing tolerance/ walking with more normalized gait pattern.     Baseline  B LE muscle strength grossly 4/5 MMT (pain limited). 09/26/17: hip flexor 4/5, quad 4+/5 ; 11/14/17 hip flexor 4+/5, L quad 4/5, R quad 4+/5; 12/19/17 5/5 except L quad pain limited    Time  8    Period  Weeks    Status  Partially Met    Target Date  03/13/18      PT LONG TERM GOAL #4   Title  Pt. will ambulate 10 min. with normalized gait pattern with no increase c/o knee/foot pain.    Baseline  Marked improvement in gait pattern but pain remains.     Time  8    Period  Weeks    Status  Partially Met    Target Date  03/13/18            Plan - 01/16/18 2042    Clinical Impression Statement  Pt. continues to show slow but consistent progress with L foot stability/ pain-free mobility.  Generalized tenderness in L foot and pt. has benefited from use of brushing/ desensitization technique.  Pt. ambulates with more normalilzed gait pattern and fluctuations in L foot pain with daily activity.  Pt. will benefit from continued compliance of [employer] ex. program and decreasing PT tx. frequency to biweekly.      Clinical Presentation  Evolving    Clinical Decision Making  Moderate    Rehab Potential  Fair    PT Frequency  Biweekly    PT Duration  8 weeks    PT Treatment/Interventions  ADLs/Self Care Home Management;Aquatic Therapy;Cryotherapy;Iontophoresis 95m/ml Dexamethasone;Ultrasound;Patient/family education;Neuromuscular re-education;Balance  training;Therapeutic exercise;Therapeutic activities;Functional mobility training;Stair training;Gait training;Manual techniques;Passive range of motion;Dry needling  PT Next Visit Plan  VMO strengthening; manual work on adductors; advance HEP; ankle stability therex.        Patient will benefit from skilled therapeutic intervention in order to improve the following deficits and impairments:  Abnormal gait, Decreased strength, Increased muscle spasms, Postural dysfunction, Impaired perceived functional ability, Decreased activity tolerance, Decreased mobility, Impaired flexibility, Obesity, Decreased range of motion, Decreased balance, Pain, Decreased endurance  Visit Diagnosis: Pain in left foot  Gait difficulty  Muscle weakness (generalized)  Chronic pain of right knee  Chronic pain of left knee  Pain in left ankle and joints of left foot     Problem List Patient Active Problem List   Diagnosis Date Noted  . Kidney stone 04/25/2017  . Ureteral stone    Pura Spice, PT, DPT # [id number] Virgia Land, SPT 01/16/2018, 8:51 PM  [place] [facility] [address] [place], [place], [address] Phone: [phone]   Fax:  [phone]  Name: Kelsey Bell MRN: [id number] Date of Birth: [birth year]

## 2018-01-30 ENCOUNTER — Encounter: Payer: Self-pay | Admitting: Physical Therapy

## 2018-02-01 ENCOUNTER — Ambulatory Visit: Payer: Self-pay | Admitting: Physical Medicine & Rehabilitation

## 2018-02-02 ENCOUNTER — Ambulatory Visit (INDEPENDENT_AMBULATORY_CARE_PROVIDER_SITE_OTHER): Payer: No Typology Code available for payment source

## 2018-02-02 ENCOUNTER — Ambulatory Visit
Admission: EM | Admit: 2018-02-02 | Discharge: 2018-02-02 | Disposition: A | Discharge status: Home / Self Care | Payer: No Typology Code available for payment source | Attending: Emergency Medicine | Admitting: Emergency Medicine

## 2018-02-02 ENCOUNTER — Other Ambulatory Visit: Payer: Self-pay

## 2018-02-02 DIAGNOSIS — W01198A Fall on same level from slipping, tripping and stumbling with subsequent striking against other object, initial encounter: Secondary | ICD-10-CM

## 2018-02-02 DIAGNOSIS — M79672 Pain in left foot: Secondary | ICD-10-CM

## 2018-02-02 DIAGNOSIS — S90122A Contusion of left lesser toe(s) without damage to nail, initial encounter: Secondary | ICD-10-CM

## 2018-02-02 DIAGNOSIS — M79675 Pain in left toe(s): Secondary | ICD-10-CM

## 2018-02-02 HISTORY — DX: Complex regional pain syndrome I, unspecified: G90.50

## 2018-02-02 NOTE — ED Provider Notes (Signed)
[facility]    CSN: [id number] Arrival date & time: 02/02/18  1021     History   Chief Complaint Chief Complaint  Patient presents with  . Foot Pain    HPI Kelsey Bell is a 37 y.o. female.   Presents to the urgent care facility for evaluation of left foot pain.  She states she tripped and jammed her left fifth digit as she was walking up concrete steps.  She is uncertain if she jammed it against the concrete step or the suitcase.  This occurred yesterday.  She is having 8 out of 10 pain.  She is taken Advil 400 mg.  She is able to ambulate but with a limp.  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  Has been making good progress with CRPS with physical therapy.  Patient denies any other injury during her fall.  HPI  Past Medical History:  Diagnosis Date  . Allergy   . Anxiety   . Arthritis   . Asthma   . Breast pain present for several months   Bil LT >RT across of breasts  . CRPS (complex regional pain syndrome type I)   . Depression   . Kidney stones   . LGSIL on Pap smear of cervix 2007  . Migraine   . Neuromuscular disorder (HCC)    Complex regional pain syndrome and Fibromyalgia    Patient Active Problem List   Diagnosis Date Noted  . Kidney stone 04/25/2017  . Ureteral stone     Past Surgical History:  Procedure Laterality Date  . COLPOSCOPY  2017   neg bx  . EXTRACORPOREAL SHOCK WAVE LITHOTRIPSY Right 04/26/2017   Procedure: EXTRACORPOREAL SHOCK WAVE LITHOTRIPSY (ESWL);  Surgeon: Vanna Scotland, MD;  Location: [facility] ORS;  Service: Urology;  Laterality: Right;  . KNEE ARTHROSCOPY Right   . TONSILLECTOMY      OB History    Gravida  0   Para  0   Term  0   Preterm  0   AB  0   Living  0     SAB  0   TAB  0   Ectopic  0   Multiple  0   Live Births  0            Home Medications    Prior to Admission medications   Medication Sig Start Date End Date Taking? Authorizing Provider    acetaminophen (TYLENOL) 500 MG tablet Take 1,000 mg by mouth every evening.     [provider]  albuterol (PROVENTIL HFA;VENTOLIN HFA) 108 (90 Base) MCG/ACT inhaler Inhale 1 puff into the lungs as needed for wheezing or shortness of breath.    Mertie Moores, MD  ALPRAZolam Prudy Feeler) 0.25 MG tablet Take 0.25 mg by mouth as needed for anxiety.    [provider]  benzonatate (TESSALON) 200 MG capsule Take one cap TID PRN cough 06/17/17   Lutricia Feil, PA-C  budesonide-formoterol [facility]) 160-4.5 MCG/ACT inhaler Inhale 2 puffs into the lungs 2 (two) times daily. Using as needed    Mertie Moores, MD  calcium carbonate (OS-CAL) 600 MG TABS tablet Take 600 mg by mouth daily.    [provider]  Cholecalciferol (VITAMIN D3) 5000 units TABS Take 10,000 Units by mouth daily.     [provider]  cyclobenzaprine (FLEXERIL) 5 MG tablet Take by mouth as needed.  08/10/16   [provider]  FLUoxetine (PROZAC) 40 MG capsule Take 80 mg by mouth daily.    [provider]  fluticasone (FLONASE) 50 MCG/ACT nasal spray Place 2 sprays into the nose 2 (two) times daily. 06/07/17   [provider]  HYDROcodone-acetaminophen (NORCO) 5-325 MG tablet Take 1 tablet by mouth every 6 (six) hours as needed for moderate pain. 04/26/17   Adrian Saran, MD  ipratropium-albuterol (DUONEB) 0.5-2.5 (3) MG/3ML SOLN Take 3 mLs by nebulization as needed.    Fleming, Herbon E, MD  Lidocaine-Menthol 4-4 % PTCH Apply 1 patch topically every 12 (twelve) hours.    Sheran Luz, MD  montelukast (SINGULAIR) 10 MG tablet Take 10 mg by mouth at bedtime.    [provider]  Multiple Vitamin (MULTI-VITAMINS) TABS Take 1 tablet by mouth daily.    [provider]  ondansetron (ZOFRAN) 8 MG tablet Take 1 tablet (8 mg total) by mouth every 8 (eight) hours as needed for nausea or vomiting. 12/05/17   Joni Reining, PA-C  oseltamivir (TAMIFLU) 75 MG capsule  Take 1 capsule (75 mg total) by mouth every 12 (twelve) hours. 06/17/17   Lutricia Feil, PA-C  oxyCODONE-acetaminophen (PERCOCET) 7.5-325 MG tablet Take 1 tablet by mouth every 6 (six) hours as needed for severe pain. 12/05/17   Joni Reining, PA-C  oxyCODONE-acetaminophen (PERCOCET) 7.5-325 MG tablet Take 1 tablet by mouth every 6 (six) hours as needed for severe pain. 12/05/17   Joni Reining, PA-C  promethazine (PHENERGAN) 25 MG tablet Take 1 tablet (25 mg total) by mouth every 6 (six) hours as needed for nausea or vomiting. 06/06/17   [name], MD  promethazine (PHENERGAN) 25 MG tablet Take 1 tablet (25 mg total) by mouth every 6 (six) hours as needed for up to 12 days for nausea or vomiting. 12/05/17 12/17/17  Joni Reining, PA-C  tamsulosin (FLOMAX) 0.4 MG CAPS capsule Take 1 capsule (0.4 mg total) by mouth daily. 05/17/17   Michiel Cowboy A, PA-C  topiramate (TOPAMAX) 50 MG tablet Take 250 mg by mouth at bedtime.     [provider]    Family History Family History  Adopted: Yes  Problem Relation Age of Onset  . Alcohol abuse Mother   . Depression Mother   . Drug abuse Mother   . Mental illness Mother   . Alcohol abuse Father   . Arthritis Father   . Asthma Father   . COPD Father   . Depression Father   . Diabetes Father   . Drug abuse Father   . Early death Father   . Heart disease Father   . Hyperlipidemia Father   . Hypertension Father   . Kidney disease Father   . Stroke Father   . Diabetes Brother   . Depression Brother   . Kidney cancer Neg Hx   . Bladder Cancer Neg Hx     Social History Social History   Tobacco Use  . Smoking status: Never Smoker  . Smokeless tobacco: Never Used  Substance Use Topics  . Alcohol use: No  . Drug use: No     Allergies   Cefdinir; Esomeprazole; Esomeprazole magnesium; Gabapentin; Hydroxychloroquine; Tapentadol; Nsaids; Sumatriptan succinate; Cortisone; Duloxetine; Prednisone; Amoxicillin; Amoxicillin-pot  clavulanate; Reglan [metoclopramide]; Sulfa antibiotics; and Sulfasalazine   Review of Systems Review of Systems  Constitutional: Negative for fever.  Musculoskeletal: Positive for arthralgias, gait problem and joint swelling. Negative for myalgias.  Skin: Negative for rash and wound.  Neurological:  Negative for dizziness and headaches.     Physical Exam Triage Vital Signs ED Triage Vitals  Enc Vitals Group     BP 02/02/18 1033 124/61     Pulse Rate 02/02/18 1033 100     Resp 02/02/18 1033 18     Temp 02/02/18 1033 98.1 F (36.7 C)     Temp Source 02/02/18 1033 Oral     SpO2 02/02/18 1033 100 %     Weight 02/02/18 1034 270 lb (122.5 kg)     Height 02/02/18 1034 5' 7.5" (1.715 m)     Head Circumference --      Peak Flow --      Pain Score 02/02/18 1031 8     Pain Loc --      Pain Edu? --      Excl. in GC? --    No data found.  Updated Vital Signs BP 124/61 (BP Location: Right Arm)   Pulse 100   Temp 98.1 F (36.7 C) (Oral)   Resp 18   Ht 5' 7.5" (1.715 m)   Wt 270 lb (122.5 kg)   LMP 01/14/2018   SpO2 100%   BMI 41.66 kg/m   Visual Acuity Right Eye Distance:   Left Eye Distance:   Bilateral Distance:    Right Eye Near:   Left Eye Near:    Bilateral Near:     Physical Exam  Constitutional: She is oriented to person, place, and time. She appears well-developed and well-nourished.  HENT:  Head: Normocephalic and atraumatic.  Eyes: Conjunctivae are normal.  Neck: Normal range of motion.  Cardiovascular: Normal rate.  Pulmonary/Chest: Effort normal. No respiratory distress.  Musculoskeletal: Normal range of motion.  Left foot shows ecchymosis and mild swelling along the fifth toe.  There is no skin breakdown noted.  She is tender palpation along the fifth digit.  Mild other diffuse tenderness throughout the foot.  No skin breakdown noted.  No warmth or redness.  Sensation is intact.  Neurological: She is alert and oriented to person, place, and time.    Skin: Skin is warm. No rash noted.  Psychiatric: She has a normal mood and affect. Her behavior is normal. Thought content normal.     UC Treatments / Results  Labs (all labs ordered are listed, but only abnormal results are displayed) Labs Reviewed - No data to display  EKG None  Radiology Dg Foot Complete Left  Result Date: 02/02/2018 CLINICAL DATA:  37 year old female with lateral foot pain after dropping a suitcase on her foot and twisting the foot. EXAM: LEFT FOOT - COMPLETE 3+ VIEW COMPARISON:  None. FINDINGS: There is no evidence of fracture or dislocation. There is no evidence of arthropathy or other focal bone abnormality. Soft tissues are unremarkable. Small os peroneus and os navicularis noted incidentally. IMPRESSION: Negative. Electronically Signed   By: Malachy Moan M.D.   On: 02/02/2018 10:50    Procedures Procedures (including critical care time)  Medications Ordered in UC Medications - No data to display  Initial Impression / Assessment and Plan / UC Course  I have reviewed the triage vital signs and the nursing notes.  Pertinent labs & imaging results that were available during my care of the patient were reviewed by me and considered in my medical decision making (see chart for details).     37 year old female with contusion to the left fifth toe.  She will use crutches, postop shoe which were given today.  She will rest  ice and elevate.  She can begin bearing weight when she is able to ambulate without a limp.  She will alternate Tylenol and ibuprofen as needed for pain.  She will follow-up with podiatrist or PCP in 1 week if no improvement. Final Clinical Impressions(s) / UC Diagnoses   Final diagnoses:  Contusion of fifth toe of left foot, initial encounter     Discharge Instructions     Please rest ice and elevate the left foot.  Follow-up with podiatrist or primary care provider in 1 week if no improvement of left fifth toe pain.  Alternate  Tylenol and ibuprofen as needed for pain.  Apply ice 20 minutes every hour.  You may bear weight as tolerated but you may need to crutches over the next few days.   ED Prescriptions    None       Evon SlackGaines, Thomas C, [place] JerseyPA-C 02/02/18 1105

## 2018-02-02 NOTE — ED Triage Notes (Signed)
Pt reports yesterday her left foot got caught between suitcase and concrete step. Left little toe bruised. Pain 8/10

## 2018-02-02 NOTE — Discharge Instructions (Addendum)
Please rest ice and elevate the left foot.  Follow-up with podiatrist or primary care provider in 1 week if no improvement of left fifth toe pain.  Alternate Tylenol and ibuprofen as needed for pain.  Apply ice 20 minutes every hour.  You may bear weight as tolerated but you may need to crutches over the next few days.

## 2018-02-04 ENCOUNTER — Ambulatory Visit
Payer: No Typology Code available for payment source | Attending: Physical Medicine and Rehabilitation | Admitting: Physical Therapy

## 2018-02-04 DIAGNOSIS — M25572 Pain in left ankle and joints of left foot: Secondary | ICD-10-CM | POA: Insufficient documentation

## 2018-02-04 DIAGNOSIS — G8929 Other chronic pain: Secondary | ICD-10-CM | POA: Insufficient documentation

## 2018-02-04 DIAGNOSIS — M79672 Pain in left foot: Secondary | ICD-10-CM

## 2018-02-04 DIAGNOSIS — M25562 Pain in left knee: Secondary | ICD-10-CM | POA: Insufficient documentation

## 2018-02-04 DIAGNOSIS — R269 Unspecified abnormalities of gait and mobility: Secondary | ICD-10-CM

## 2018-02-04 DIAGNOSIS — M25561 Pain in right knee: Secondary | ICD-10-CM | POA: Insufficient documentation

## 2018-02-04 DIAGNOSIS — M6281 Muscle weakness (generalized): Secondary | ICD-10-CM | POA: Insufficient documentation

## 2018-02-06 ENCOUNTER — Encounter: Payer: Self-pay | Admitting: Physical Therapy

## 2018-02-08 NOTE — Therapy (Addendum)
[facility] [address] [place], [place], [address] Phone: [phone]   Fax:  [phone]  Physical Therapy Treatment  Patient Details  Name: Kelsey Bell MRN: [id number] Date of Birth: 1981-03-22 Referring Provider: Dr. Nelva Bush   Encounter Date: 02/04/2018  PT End of Session - 02/13/18 1035    Visit Number  22    Number of Visits  25    Date for PT Re-Evaluation  03/13/18    PT Start Time  1443    PT Stop Time  1612    PT Time Calculation (min)  58 min    Activity Tolerance  Patient tolerated treatment well    Behavior During Therapy  [facility] for tasks assessed/performed       Past Medical History:  Diagnosis Date  . Allergy   . Anxiety   . Arthritis   . Asthma   . Breast pain present for several months   Bil LT >RT across of breasts  . CRPS (complex regional pain syndrome type I)   . Depression   . Kidney stones   . LGSIL on Pap smear of cervix 2007  . Migraine   . Neuromuscular disorder ([place])    Complex regional pain syndrome and Fibromyalgia    Past Surgical History:  Procedure Laterality Date  . COLPOSCOPY  2017   neg bx  . EXTRACORPOREAL SHOCK WAVE LITHOTRIPSY Right 04/26/2017   Procedure: EXTRACORPOREAL SHOCK WAVE LITHOTRIPSY (ESWL);  Surgeon: Hollice Espy, MD;  Location: [facility] ORS;  Service: Urology;  Laterality: Right;  . KNEE ARTHROSCOPY Right   . TONSILLECTOMY      There were no vitals filed for this visit.  Subjective Assessment - 02/13/18 0853    Subjective  Pt. entered PT with L walking boot due to hair line fracture of distal 5th digit/ metatarsal.      Pertinent History  pt. known well to PT clinic.      Limitations  Standing;Walking;House hold activities;Other (comment)    Patient Stated Goals  Decrease knee/foot pain.  Improve walking    Currently in Pain?  Yes    Pain Score  8     Pain Location  Foot    Pain Orientation  Left    Pain Descriptors / Indicators  Aching    Pain Type  Chronic  pain    Pain Radiating Towards  L 5th metatarsal/digit brusing and pain.  Pt. reports hairline fracture.          Warm-up:  SCIFIT lv 6 x 10 min (unbiled)  Manual therapy:  Supine L talocrural distraction 3 x 30 sec with inversion moment to gap lateral ankle Myofascial release to intrinsic foot musculature (over plantar surface) x 5 min Calcaneal mobilization grade II-III 3 bouts of 20 sec PROM of L ankle (all planes)/ 2nd to 5th digit (flexion/ extension).  No ROM on 5th digit secondary to recent injury.   Reviewed HEP.      PT Long Term Goals - 01/16/18 2046      PT LONG TERM GOAL #1   Title  Pt. I with HEP to increase L ankle AROM to WNL as compared to R ankle to improve pain-free mobility.      Baseline  B ankle AROM WFL (pain focus with L ankle).      Time  4    Period  Weeks    Status  Achieved    Target Date  01/16/18  PT LONG TERM GOAL #2   Title  Pt. will increase LEFS to >40 out of 80 to improve pain-free mobility/ return to work.      Baseline  LEFS: 21 out of 80 on 11/27; 09/26/17: 36/80; 11/14/17 37/80; 12/19/17 40/80.   On 5/15: 34 (varying fluctuations do to pain).      Time  8    Period  Weeks    Status  Not Met    Target Date  03/13/18      PT LONG TERM GOAL #3   Title  Pt. will increase B hip flexor/ quad. strengthening to 4+/5 MMT to improve standing tolerance/ walking with more normalized gait pattern.     Baseline  B LE muscle strength grossly 4/5 MMT (pain limited). 09/26/17: hip flexor 4/5, quad 4+/5 ; 11/14/17 hip flexor 4+/5, L quad 4/5, R quad 4+/5; 12/19/17 5/5 except L quad pain limited    Time  8    Period  Weeks    Status  Partially Met    Target Date  03/13/18      PT LONG TERM GOAL #4   Title  Pt. will ambulate 10 min. with normalized gait pattern with no increase c/o knee/foot pain.    Baseline  Marked improvement in gait pattern but pain remains.     Time  8    Period  Weeks    Status  Partially Met    Target Date  03/13/18             Plan - 02/13/18 1036    Clinical Impression Statement  PT tx. and progression of of L ankle/lower stability limited today due to recent 5th digit swelling/ bruising/ pain.  PT focused on manual stretches of L ankle/ 1st to 4th digits.  Pt. ambulates with moderate L antalgic gait with use of walking shoe secondary to rigidity of sole/ pain in 5th digit.        Clinical Presentation  Evolving    Clinical Decision Making  Moderate    Rehab Potential  Fair    PT Frequency  Biweekly    PT Duration  8 weeks    PT Treatment/Interventions  ADLs/Self Care Home Management;Aquatic Therapy;Cryotherapy;Iontophoresis 67m/ml Dexamethasone;Ultrasound;Patient/family education;Neuromuscular re-education;Balance training;Therapeutic exercise;Therapeutic activities;Functional mobility training;Stair training;Gait training;Manual techniques;Passive range of motion;Dry needling    PT Next Visit Plan  VMO strengthening; manual work on adductors; advance HEP; ankle stability therex.    REASSESS L 5th digit.     PT Home Exercise Plan  See handouts.      Consulted and Agree with Plan of Care  Patient       Patient will benefit from skilled therapeutic intervention in order to improve the following deficits and impairments:  Abnormal gait, Decreased strength, Increased muscle spasms, Postural dysfunction, Impaired perceived functional ability, Decreased activity tolerance, Decreased mobility, Impaired flexibility, Obesity, Decreased range of motion, Decreased balance, Pain, Decreased endurance  Visit Diagnosis: Pain in left foot  Gait difficulty  Muscle weakness (generalized)  Chronic pain of right knee     Problem List Patient Active Problem List   Diagnosis Date Noted  . Kidney stone 04/25/2017  . Ureteral stone    MPura Spice PT, DPT # [id number]/08/2018, 12:59 PM  [facility] [address]Phone: [phone]   Fax:  [phone] Name: Kelsey AVINOMRN: [id number] of Birth: [birth year]

## 2018-02-13 ENCOUNTER — Encounter: Payer: Self-pay | Admitting: Physical Therapy

## 2018-02-13 ENCOUNTER — Ambulatory Visit: Payer: No Typology Code available for payment source | Admitting: Physical Therapy

## 2018-02-13 DIAGNOSIS — R269 Unspecified abnormalities of gait and mobility: Secondary | ICD-10-CM

## 2018-02-13 DIAGNOSIS — M25561 Pain in right knee: Secondary | ICD-10-CM

## 2018-02-13 DIAGNOSIS — G8929 Other chronic pain: Secondary | ICD-10-CM

## 2018-02-13 DIAGNOSIS — M79672 Pain in left foot: Secondary | ICD-10-CM

## 2018-02-13 DIAGNOSIS — M6281 Muscle weakness (generalized): Secondary | ICD-10-CM

## 2018-02-14 NOTE — Therapy (Signed)
[facility] [address] [place], [place], [address] Phone: [phone]   Fax:  [phone]  Physical Therapy Treatment  Patient Details  Name: Kelsey Bell MRN: [id number] Date of Birth: 1981/01/16 Referring Provider: Dr. Nelva Bush   Encounter Date: 02/13/2018  PT End of Session - 02/14/18 1432    Visit Number  23    Number of Visits  25    Date for PT Re-Evaluation  03/13/18    PT Start Time  1529    PT Stop Time  1642    PT Time Calculation (min)  73 min    Activity Tolerance  Patient tolerated treatment well    Behavior During Therapy  [facility] for tasks assessed/performed       Past Medical History:  Diagnosis Date  . Allergy   . Anxiety   . Arthritis   . Asthma   . Breast pain present for several months   Bil LT >RT across of breasts  . CRPS (complex regional pain syndrome type I)   . Depression   . Kidney stones   . LGSIL on Pap smear of cervix 2007  . Migraine   . Neuromuscular disorder ([place])    Complex regional pain syndrome and Fibromyalgia    Past Surgical History:  Procedure Laterality Date  . COLPOSCOPY  2017   neg bx  . EXTRACORPOREAL SHOCK WAVE LITHOTRIPSY Right 04/26/2017   Procedure: EXTRACORPOREAL SHOCK WAVE LITHOTRIPSY (ESWL);  Surgeon: Hollice Espy, MD;  Location: [facility] ORS;  Service: Urology;  Laterality: Right;  . KNEE ARTHROSCOPY Right   . TONSILLECTOMY      There were no vitals filed for this visit.  Subjective Assessment - 02/14/18 1417    Subjective  Pt reports to physical therapy without brace. Pt reports reduction in swelling over L foot/ankle and increase in activity over past week but ankle remains "stiff". Pt is disappointed with "setback" and eager to maintain prior gains. Reports that dorsomedial foot sensation is "weird" but slightly improved from last visit with marked reduction in "spiders crawling" sensation; reports slow improvements with desensitization home program. Reports  reduced pain in R knee with focus on knee stability during gait and recent vacation.    Pertinent History  pt. known well to PT clinic.      Limitations  Standing;Walking;House hold activities;Other (comment)    Patient Stated Goals  Decrease knee/foot pain.  Improve walking    Currently in Pain?  Yes    Pain Score  5     Pain Location  Foot    Pain Orientation  Left    Pain Descriptors / Indicators  Aching    Pain Type  Chronic pain         TREATMENT SCIFIT lv 6 x 10 min (unbilled) Assessment: mild antalgic gait with reduced stance time over L foot. Pt demonstrates reduced knee hyperextension bilaterally compared to earlier treatment sessions suggesting maintenance of quadriceps strength and neuromuscular control despite convalescence. Neuromuscular Re-ed: SLS over BLE with UE overhead 1 x 10 sec SLS over BLE on airex foam 1 x 10 sec - patient complained of pain in lateral L foot. Assessment: Pt demonstrates marginally reduced ROM consistent with complaint of stiffness and period of immobilization following recent fx. Pt reports "bruising" pain along 5th ray of L foot. Pt demonstrates minimal edema, no pitting. Manual therapy: Stroking over dorsal and lateral L foot for desensitization x 3 min Grade II-III posterior talocrural mobilization with concurrent stretch  into dorsiflexion 2 x 30 sec over BLE Therex: L ankle four-way isometrics x 10 with 3 sec hold Neuromuscular Re-ed: Rocking into ankle DF 3 x 20 - therapist cues to facilitate ankle strategy instead of hip strategy Romberg ball toss over BLE 2 x 10 Step-ups onto airex foam over BLE x 20, therapist cues for maintaining arch height Tandem gait across foam balance beam x 6 lengths of // bars Sidestepping across foam balance beam x 6 lengths of // bars Squats with red theraband around knees x 6 to prompt improved knee alignment and glute med activation; therapist cues to maintain knee alignment Pt reported pain in R knee, R  hip, R hamstrings. Impression: compensations present in movement patterns following acute injury to L foot have resulted in acute increased load over the RLE. Advised pt to avoid LE strengthening and allow healing in L foot to avoid complications due to central sensitization in LLE or overtraining phenomena in RLE. Manual therapy: Myofascial release and trigger point release in R glute med, R biceps femoris proximal to knee x 6 min 90-90 RLE Hamstring stretch 3 x 30 sec Pt instructed in use of tennis ball to perform myofascial release   PT Education - 02/14/18 1432    Education provided  Yes    Education Details  Activity limitations, tennis ball self-massage, HS stretches    Person(s) Educated  Patient    Methods  Explanation;Demonstration    Comprehension  Verbalized understanding;Returned demonstration          PT Long Term Goals - 01/16/18 2046      PT LONG TERM GOAL #1   Title  Pt. I with HEP to increase L ankle AROM to WNL as compared to R ankle to improve pain-free mobility.      Baseline  B ankle AROM WFL (pain focus with L ankle).      Time  4    Period  Weeks    Status  Achieved    Target Date  01/16/18      PT LONG TERM GOAL #2   Title  Pt. will increase LEFS to >40 out of 80 to improve pain-free mobility/ return to work.      Baseline  LEFS: 21 out of 80 on 11/27; 09/26/17: 36/80; 11/14/17 37/80; 12/19/17 40/80.   On 5/15: 34 (varying fluctuations do to pain).      Time  8    Period  Weeks    Status  Not Met    Target Date  03/13/18      PT LONG TERM GOAL #3   Title  Pt. will increase B hip flexor/ quad. strengthening to 4+/5 MMT to improve standing tolerance/ walking with more normalized gait pattern.     Baseline  B LE muscle strength grossly 4/5 MMT (pain limited). 09/26/17: hip flexor 4/5, quad 4+/5 ; 11/14/17 hip flexor 4+/5, L quad 4/5, R quad 4+/5; 12/19/17 5/5 except L quad pain limited    Time  8    Period  Weeks    Status  Partially Met    Target Date   03/13/18      PT LONG TERM GOAL #4   Title  Pt. will ambulate 10 min. with normalized gait pattern with no increase c/o knee/foot pain.    Baseline  Marked improvement in gait pattern but pain remains.     Time  8    Period  Weeks    Status  Partially Met  Target Date  03/13/18            Plan - 02/14/18 1432    Clinical Impression Statement  Pt demonstrates minimal loss in movement quality, strength, ROM, and abnormal sensations relative to last session with excellent maintenance of knee stability via appropriate quadriceps and gluteal activation; minimal tendency towards knee hyperextension with gait and functional activities. Treatment limited by pain in lateral L foot secondary to fx previously noted and avoidance of potential exacerbation of CRP symptoms. Muscle tightness along lateral RLE consistent with increased weight-bearing over RLE secondary to acute LLE injury and continued pain with weight bearing. Pt demonstrates good understanding of activity limitations and accords with therapist's stated desire to allow lateral L foot recovery before continuing with rigorous BLE stability therapeutic exercise. No exacerbation of CRP symptoms with possible mild improvement. Patient will continue to benefit from skilled physical therapy to reduce pain in L foot and recover PLOF.     Clinical Presentation  Evolving    Clinical Decision Making  Moderate    Rehab Potential  Fair    PT Frequency  Biweekly    PT Duration  8 weeks    PT Treatment/Interventions  ADLs/Self Care Home Management;Aquatic Therapy;Cryotherapy;Iontophoresis 28m/ml Dexamethasone;Ultrasound;Patient/family education;Neuromuscular re-education;Balance training;Therapeutic exercise;Therapeutic activities;Functional mobility training;Stair training;Gait training;Manual techniques;Passive range of motion;Dry needling    PT Next Visit Plan  Resume ankle, hip, knee stability therex as tol. Advance HEP.    PT Home Exercise Plan   See handouts.      Consulted and Agree with Plan of Care  Patient       Patient will benefit from skilled therapeutic intervention in order to improve the following deficits and impairments:  Abnormal gait, Decreased strength, Increased muscle spasms, Postural dysfunction, Impaired perceived functional ability, Decreased activity tolerance, Decreased mobility, Impaired flexibility, Obesity, Decreased range of motion, Decreased balance, Pain, Decreased endurance  Visit Diagnosis: Pain in left foot  Gait difficulty  Muscle weakness (generalized)  Chronic pain of right knee     Problem List Patient Active Problem List   Diagnosis Date Noted  . Kidney stone 04/25/2017  . Ureteral stone    MPura Spice PT, DPT # [id number] Land SPT 02/14/2018, 2:35 PM   [facility] [place] [address]Phone: [phone]  Fax:  [phone] Name: Kelsey TAVESMRN: [id number] of Birth: 1Jan 14, 1982

## 2018-02-25 ENCOUNTER — Encounter: Payer: Self-pay | Admitting: Physical Therapy

## 2018-02-25 ENCOUNTER — Ambulatory Visit: Payer: No Typology Code available for payment source | Admitting: Physical Therapy

## 2018-02-25 DIAGNOSIS — G8929 Other chronic pain: Secondary | ICD-10-CM

## 2018-02-25 DIAGNOSIS — R269 Unspecified abnormalities of gait and mobility: Secondary | ICD-10-CM

## 2018-02-25 DIAGNOSIS — M79672 Pain in left foot: Secondary | ICD-10-CM

## 2018-02-25 DIAGNOSIS — M6281 Muscle weakness (generalized): Secondary | ICD-10-CM

## 2018-02-25 DIAGNOSIS — M25561 Pain in right knee: Secondary | ICD-10-CM

## 2018-02-25 NOTE — Therapy (Signed)
[facility] [address] [place], [place], [address] Phone: [phone]   Fax:  [phone]  Physical Therapy Treatment  Patient Details  Name: Kelsey Bell MRN: [id number] Date of Birth: [birth year] Referring Provider: Dr. Nelva Bush   Encounter Date: 02/25/2018  PT End of Session - 02/25/18 1606    Visit Number  24    Number of Visits  25    Date for PT Re-Evaluation  03/13/18    PT Start Time  1409    PT Stop Time  1512    PT Time Calculation (min)  63 min    Activity Tolerance  Patient tolerated treatment well;Patient limited by pain    Behavior During Therapy  [facility] for tasks assessed/performed       Past Medical History:  Diagnosis Date  . Allergy   . Anxiety   . Arthritis   . Asthma   . Breast pain present for several months   Bil LT >RT across of breasts  . CRPS (complex regional pain syndrome type I)   . Depression   . Kidney stones   . LGSIL on Pap smear of cervix 2007  . Migraine   . Neuromuscular disorder ([place])    Complex regional pain syndrome and Fibromyalgia    Past Surgical History:  Procedure Laterality Date  . COLPOSCOPY  2017   neg bx  . EXTRACORPOREAL SHOCK WAVE LITHOTRIPSY Right 04/26/2017   Procedure: EXTRACORPOREAL SHOCK WAVE LITHOTRIPSY (ESWL);  Surgeon: Hollice Espy, MD;  Location: [facility] ORS;  Service: Urology;  Laterality: Right;  . KNEE ARTHROSCOPY Right   . TONSILLECTOMY      There were no vitals filed for this visit.  Subjective Assessment - 02/25/18 1602    Subjective  Swelling reduced since last visit but residual swelling apparent in LLE foot/ankle. No complaints of stiffness. "Deep pain", "bruising" in L foot on 5th metatarsal with WB. "Spiders" sensation has spread up lower leg to "below the knee". Pt reports low back and knee pain since lifting dogs (97 lb) last week for vet visit.    Pertinent History  pt. known well to PT clinic.      Limitations  Standing;Walking;House hold  activities;Other (comment)    Patient Stated Goals  Decrease knee/foot pain.  Improve walking    Currently in Pain?  Yes    Pain Score  5     Pain Location  Ankle    Pain Orientation  Left    Pain Descriptors / Indicators  Aching    Pain Type  Chronic pain    Pain Onset  More than a month ago    Pain Frequency  Constant    Multiple Pain Sites  Yes    Pain Score  4    Pain Location  Back    Pain Orientation  Right;Left;Lower    Pain Descriptors / Indicators  Aching    Pain Type  Acute pain        TREATMENT  Manual: Gentle stretch of B paraspinals, QL, abdominal mm. X 2 min Myofascial release to B paraspinals x 6 min Neuromuscular re-education via prone BLE SLR against therapist resistance 2 x 10 sec  Pt reports no pain in low back following manual therapy  Assessment: left ankle/foot shows mild swelling. Sand flea bites demonstrate less improvement than expected relative to pt's last visit 12 days ago. Continuing deep pain in lateral L foot suggests possible slowed healing. No other symptoms of trophic changes  observed. Pt remains hypersensitive to mild nociceptive stimuli such as fingernails but reports trying not to focus on it.  Neuromuscular re-ed: SLS with UE overhead over BLE 2 x 10 sec hold - one LOB to R over L foot BLE step-ups to SLS with UE overhead 2 x 15 - pt complained of L foot pain over "baby toe". Knee hyperextension noted with challenge BLE step-ups to SLS on blue-side BOSU x 8 - pt complained of L foot pain and R knee pain, discontinued. Therapist notes that ankle stability demands increased secondary to poor stabilization at hip.  Therapeutic exercise: BLE hip hikes 12 x 2 - pt noted fatigue. Knee hyperextension noted [employer] gym reduced BW squats 3 x 12 - therapist cues to "spiral" force for improved gluteal activation, avoid knee hyperextension. Pt able to perform with cues.  Manual: Myofascial release to BLE quads, gluteus medius to facilitate recovery x  6 min SKTC glute stretch with cues to "pull knee to opposite shoulder" to increase stretch on hip abductors 2 x 30 sec  Pt instructed in prognosis for LLE CRPS.  Pt instructed in HEP: step-ups into SLS, hip hikes (from level surface), hip hikes (on stairs for strengthening throughout range). Handout provided.       PT Education - 02/25/18 1605    Education provided  Yes    Education Details  HEP    Person(s) Educated  Patient    Methods  Explanation;Demonstration;Handout    Comprehension  Verbalized understanding;Returned demonstration          PT Long Term Goals - 01/16/18 2046      PT LONG TERM GOAL #1   Title  Pt. I with HEP to increase L ankle AROM to WNL as compared to R ankle to improve pain-free mobility.      Baseline  B ankle AROM WFL (pain focus with L ankle).      Time  4    Period  Weeks    Status  Achieved    Target Date  01/16/18      PT LONG TERM GOAL #2   Title  Pt. will increase LEFS to >40 out of 80 to improve pain-free mobility/ return to work.      Baseline  LEFS: 21 out of 80 on 11/27; 09/26/17: 36/80; 11/14/17 37/80; 12/19/17 40/80.   On 5/15: 34 (varying fluctuations do to pain).      Time  8    Period  Weeks    Status  Not Met    Target Date  03/13/18      PT LONG TERM GOAL #3   Title  Pt. will increase B hip flexor/ quad. strengthening to 4+/5 MMT to improve standing tolerance/ walking with more normalized gait pattern.     Baseline  B LE muscle strength grossly 4/5 MMT (pain limited). 09/26/17: hip flexor 4/5, quad 4+/5 ; 11/14/17 hip flexor 4+/5, L quad 4/5, R quad 4+/5; 12/19/17 5/5 except L quad pain limited    Time  8    Period  Weeks    Status  Partially Met    Target Date  03/13/18      PT LONG TERM GOAL #4   Title  Pt. will ambulate 10 min. with normalized gait pattern with no increase c/o knee/foot pain.    Baseline  Marked improvement in gait pattern but pain remains.     Time  8    Period  Weeks    Status  Partially Met  Target  Date  03/13/18            Plan - 02/25/18 1606    Clinical Impression Statement  LBP abolished with manual stretches and myofascial release; reviewed safe lifting techniques with pt with some practice. Pt demonstrates even use of BLE but relapse into habitual knee hyperextension with dual task or challenging balance activities. Glute med weakness noted in SLS resulting in knee pain when ankle stability is challenged. Pt responded well to hip strengthening therex although ankle stability therex still limited by pain secondary to fx noted earlier. Patient will continue to benefit from skilled physical therapy to improve hip and ankle stability in BLE for reduced pain and return to PLOF.    Rehab Potential  Fair    PT Frequency  Biweekly    PT Duration  8 weeks    PT Treatment/Interventions  ADLs/Self Care Home Management;Aquatic Therapy;Cryotherapy;Iontophoresis 46m/ml Dexamethasone;Ultrasound;Patient/family education;Neuromuscular re-education;Balance training;Therapeutic exercise;Therapeutic activities;Functional mobility training;Stair training;Gait training;Manual techniques;Passive range of motion;Dry needling    PT Next Visit Plan  Advance HEP, d/c plan    PT Home Exercise Plan  See handouts.      Consulted and Agree with Plan of Care  Patient       Patient will benefit from skilled therapeutic intervention in order to improve the following deficits and impairments:  Abnormal gait, Decreased strength, Increased muscle spasms, Postural dysfunction, Impaired perceived functional ability, Decreased activity tolerance, Decreased mobility, Impaired flexibility, Obesity, Decreased range of motion, Decreased balance, Pain, Decreased endurance  Visit Diagnosis: Pain in left foot  Gait difficulty  Muscle weakness (generalized)  Chronic pain of right knee     Problem List Patient Active Problem List   Diagnosis Date Noted  . Kidney stone 04/25/2017  . Ureteral stone    MPura Spice PT, DPT # [id number] Land SPT 02/25/2018, 5:20 PM  [facility] [address]Phone: [phone]  Fax:  [phone] Name: Kelsey DUMANMRN: [id number] of Birth: 110/24/1982

## 2018-02-27 ENCOUNTER — Ambulatory Visit: Payer: No Typology Code available for payment source | Admitting: Physical Therapy

## 2018-02-27 ENCOUNTER — Encounter: Payer: Self-pay | Admitting: Physical Therapy

## 2018-02-27 DIAGNOSIS — G8929 Other chronic pain: Secondary | ICD-10-CM

## 2018-02-27 DIAGNOSIS — R269 Unspecified abnormalities of gait and mobility: Secondary | ICD-10-CM

## 2018-02-27 DIAGNOSIS — M6281 Muscle weakness (generalized): Secondary | ICD-10-CM

## 2018-02-27 DIAGNOSIS — M79672 Pain in left foot: Secondary | ICD-10-CM

## 2018-02-27 DIAGNOSIS — M25561 Pain in right knee: Secondary | ICD-10-CM

## 2018-02-27 NOTE — Therapy (Addendum)
[facility] [address] [place], [place], [address] Phone: [phone]   Fax:  [phone]  Physical Therapy Treatment  Patient Details  Name: Kelsey Bell MRN: [id number] Date of Birth: 1980-11-10 Referring Provider: Dr. Nelva Bush   Encounter Date: 02/27/2018  PT End of Session - 02/27/18 1706    Visit Number  25    Number of Visits  25    Date for PT Re-Evaluation  03/13/18    PT Start Time  2440    PT Stop Time  1625    PT Time Calculation (min)  68 min    Activity Tolerance  Patient limited by pain    Behavior During Therapy  [facility] for tasks assessed/performed       Past Medical History:  Diagnosis Date  . Allergy   . Anxiety   . Arthritis   . Asthma   . Breast pain present for several months   Bil LT >RT across of breasts  . CRPS (complex regional pain syndrome type I)   . Depression   . Kidney stones   . LGSIL on Pap smear of cervix 2007  . Migraine   . Neuromuscular disorder ([place])    Complex regional pain syndrome and Fibromyalgia    Past Surgical History:  Procedure Laterality Date  . COLPOSCOPY  2017   neg bx  . EXTRACORPOREAL SHOCK WAVE LITHOTRIPSY Right 04/26/2017   Procedure: EXTRACORPOREAL SHOCK WAVE LITHOTRIPSY (ESWL);  Surgeon: Hollice Espy, MD;  Location: [facility] ORS;  Service: Urology;  Laterality: Right;  . KNEE ARTHROSCOPY Right   . TONSILLECTOMY      There were no vitals filed for this visit.  Subjective Assessment - 02/27/18 1659    Subjective  Pt presents with antalgic gait, acutely tender over L thigh. Says she was sore following last session, ran some errands. "Could not get out of bed" yesterday with pain in L thigh and R knee but knee pain has since resolved.    Pertinent History  pt. known well to PT clinic.      Limitations  Standing;Walking;House hold activities;Other (comment)    Patient Stated Goals  Decrease knee/foot pain.  Improve walking    Currently in Pain?  Yes    Pain Score   5     Pain Location  Ankle    Pain Orientation  Left    Pain Descriptors / Indicators  Aching    Pain Type  Chronic pain    Pain Onset  More than a month ago    Pain Score  8    Pain Location  Hip    Pain Orientation  Anterior    Pain Descriptors / Indicators  Aching    Pain Type  Acute pain         TREATMENT  Gentle myofascial release along anterior L thigh, lateral hip x 10 min - pt exquisitely tender to palpation especially along sartorius Gentle stretching to hip adductors, quadriceps - discontinued due to pt complaints of pain Gentle myofascial release along L paraspinals, quadratus lumborum x 4 min  Pt able to ambulate with normal gait following manual therapy with reduced pain.  Marching in parallel bars x 40 steps Alternating SLS balance with 5 sec holds x 20 Alternating SLS on airex foam with 5 sec holds x 10 - discontinued with pt complaint of pain in anterior thigh. Pt demonstrates improved ankle strategy since last session with one instance of LOB to R over L  foot.  Moist heat to anterior L thigh with concurrent myofascial release to focal tightness at R anterior thigh at musculotendinous junction of R quadriceps, R gluteus medius x 14 min  Hip hikes x 10 Re-printed and reviewed HEP with pt.  Pt reported reduced but continuing soreness at conclusion of treatment session. Advised to reduce activity to pain-free level and allow 1-2 days for resolution of acute exacerbation before resuming normal activities.   PT Education - 02/27/18 1706    Education provided  Yes    Education Details  HEP, activity limitations to reduce pain    Person(s) Educated  Patient    Methods  Explanation;Handout;Demonstration    Comprehension  Verbalized understanding;Returned demonstration          PT Long Term Goals - 01/16/18 2046      PT LONG TERM GOAL #1   Title  Pt. I with HEP to increase L ankle AROM to WNL as compared to R ankle to improve pain-free mobility.      Baseline   B ankle AROM WFL (pain focus with L ankle).      Time  4    Period  Weeks    Status  Achieved    Target Date  01/16/18      PT LONG TERM GOAL #2   Title  Pt. will increase LEFS to >40 out of 80 to improve pain-free mobility/ return to work.      Baseline  LEFS: 21 out of 80 on 11/27; 09/26/17: 36/80; 11/14/17 37/80; 12/19/17 40/80.   On 5/15: 34 (varying fluctuations do to pain).      Time  8    Period  Weeks    Status  Not Met    Target Date  03/13/18      PT LONG TERM GOAL #3   Title  Pt. will increase B hip flexor/ quad. strengthening to 4+/5 MMT to improve standing tolerance/ walking with more normalized gait pattern.     Baseline  B LE muscle strength grossly 4/5 MMT (pain limited). 09/26/17: hip flexor 4/5, quad 4+/5 ; 11/14/17 hip flexor 4+/5, L quad 4/5, R quad 4+/5; 12/19/17 5/5 except L quad pain limited    Time  8    Period  Weeks    Status  Partially Met    Target Date  03/13/18      PT LONG TERM GOAL #4   Title  Pt. will ambulate 10 min. with normalized gait pattern with no increase c/o knee/foot pain.    Baseline  Marked improvement in gait pattern but pain remains.     Time  8    Period  Weeks    Status  Partially Met    Target Date  03/13/18            Plan - 02/27/18 1707    Clinical Impression Statement  Pt presents with antalgic gait today and is hyperirritable and exquisitely tender to palpation over L sartorius, quadriceps. R thigh musculature demonstrates focal tightness at the musculotendinous junction without exquisite tenderness. Therapist suspects exacerbation of delayed onset muscle soreness secondary to central sensitization phenomena following strenuous activity during last session. Pt responds well to gentle manual therapy but pain exacerbated with stretching. Able to tolerate light neuromuscular rehabilitation focusing on L ankle strategies for balance. Pt advised to limit activity according to pain until resolution of symptoms. Pt will continue to  benefit from skilled physical therapy to improve hip and ankle stability in BLE for  reduced pain and return to PLOF.    Rehab Potential  Fair    PT Frequency  Biweekly    PT Duration  8 weeks    PT Treatment/Interventions  ADLs/Self Care Home Management;Aquatic Therapy;Cryotherapy;Iontophoresis 39m/ml Dexamethasone;Ultrasound;Patient/family education;Neuromuscular re-education;Balance training;Therapeutic exercise;Therapeutic activities;Functional mobility training;Stair training;Gait training;Manual techniques;Passive range of motion;Dry needling    PT Next Visit Plan  RECERT/D/C next tx. session .     PT Home Exercise Plan  See handouts.      Consulted and Agree with Plan of Care  Patient       Patient will benefit from skilled therapeutic intervention in order to improve the following deficits and impairments:  Abnormal gait, Decreased strength, Increased muscle spasms, Postural dysfunction, Impaired perceived functional ability, Decreased activity tolerance, Decreased mobility, Impaired flexibility, Obesity, Decreased range of motion, Decreased balance, Pain, Decreased endurance  Visit Diagnosis: Pain in left foot  Gait difficulty  Muscle weakness (generalized)  Chronic pain of right knee     Problem List Patient Active Problem List   Diagnosis Date Noted  . Kidney stone 04/25/2017  . Ureteral stone    MPura Spice PT, DPT # [id number] Land SPT 02/28/2018, 11:42 AM  [facility] [place] [address]Phone: [phone]  Fax:  [phone] Name: Kelsey DEANEMRN: [id number] of Birth: 1Aug 16, 1982

## 2018-03-04 ENCOUNTER — Other Ambulatory Visit: Payer: Self-pay

## 2018-03-04 ENCOUNTER — Encounter: Payer: No Typology Code available for payment source | Attending: Physical Medicine & Rehabilitation

## 2018-03-04 ENCOUNTER — Ambulatory Visit (HOSPITAL_BASED_OUTPATIENT_CLINIC_OR_DEPARTMENT_OTHER): Payer: No Typology Code available for payment source | Admitting: Physical Medicine & Rehabilitation

## 2018-03-04 ENCOUNTER — Encounter: Payer: Self-pay | Admitting: Physical Medicine & Rehabilitation

## 2018-03-04 VITALS — BP 128/84 | HR 83 | Ht 68.0 in | Wt 298.6 lb

## 2018-03-04 DIAGNOSIS — Z823 Family history of stroke: Secondary | ICD-10-CM | POA: Insufficient documentation

## 2018-03-04 DIAGNOSIS — Z841 Family history of disorders of kidney and ureter: Secondary | ICD-10-CM | POA: Insufficient documentation

## 2018-03-04 DIAGNOSIS — Z825 Family history of asthma and other chronic lower respiratory diseases: Secondary | ICD-10-CM | POA: Insufficient documentation

## 2018-03-04 DIAGNOSIS — Z833 Family history of diabetes mellitus: Secondary | ICD-10-CM | POA: Insufficient documentation

## 2018-03-04 DIAGNOSIS — Z8349 Family history of other endocrine, nutritional and metabolic diseases: Secondary | ICD-10-CM | POA: Insufficient documentation

## 2018-03-04 DIAGNOSIS — Z813 Family history of other psychoactive substance abuse and dependence: Secondary | ICD-10-CM | POA: Insufficient documentation

## 2018-03-04 DIAGNOSIS — Z881 Allergy status to other antibiotic agents status: Secondary | ICD-10-CM | POA: Insufficient documentation

## 2018-03-04 DIAGNOSIS — G8929 Other chronic pain: Secondary | ICD-10-CM | POA: Insufficient documentation

## 2018-03-04 DIAGNOSIS — Z8249 Family history of ischemic heart disease and other diseases of the circulatory system: Secondary | ICD-10-CM | POA: Insufficient documentation

## 2018-03-04 DIAGNOSIS — J45909 Unspecified asthma, uncomplicated: Secondary | ICD-10-CM | POA: Insufficient documentation

## 2018-03-04 DIAGNOSIS — G90522 Complex regional pain syndrome I of left lower limb: Secondary | ICD-10-CM

## 2018-03-04 DIAGNOSIS — Z8052 Family history of malignant neoplasm of bladder: Secondary | ICD-10-CM | POA: Insufficient documentation

## 2018-03-04 DIAGNOSIS — Z818 Family history of other mental and behavioral disorders: Secondary | ICD-10-CM | POA: Insufficient documentation

## 2018-03-04 DIAGNOSIS — Z8261 Family history of arthritis: Secondary | ICD-10-CM | POA: Insufficient documentation

## 2018-03-04 DIAGNOSIS — M79672 Pain in left foot: Secondary | ICD-10-CM | POA: Insufficient documentation

## 2018-03-04 DIAGNOSIS — Z885 Allergy status to narcotic agent status: Secondary | ICD-10-CM | POA: Insufficient documentation

## 2018-03-04 DIAGNOSIS — M797 Fibromyalgia: Secondary | ICD-10-CM

## 2018-03-04 DIAGNOSIS — Z9889 Other specified postprocedural states: Secondary | ICD-10-CM | POA: Insufficient documentation

## 2018-03-04 MED ORDER — DICLOFENAC SODIUM 1 % TD GEL: 2.0000 g | Freq: Four times a day (QID) | TRANSDERMAL | 2 refills | Status: DC

## 2018-03-04 NOTE — Progress Notes (Signed)
Subjective:    Patient ID: Kelsey Bell, female    DOB: 08/12/1981, 37 y.o.   MRN: [id number]  HPI CC: 37 year old female with prior history of fibromyalgia syndrome with irritable bowel who has a chief complaint of left foot pain syndrome  WC injury 2017 dropped gallon of ice tea on Left foot Seen by Podiatry, negative xrays Seen by PMR Dr Ethelene Halamos- diagnosed CRPS  Left foot pain associated with numbness and a feeling of constant bee stings. Patient states that she has intermittent swelling in the left lower extremity but none today.  01/31/18 left little toe fracture, seen at urgent care  Was going to [employer] 3-4 days per wk prior to toe fracture Did core strengthening as well with machines Topamax 250mg  for headache prophyllaxis  Tried gabapentin for fibro caused swelling  Allergies to Cymbalta (sweating),  Nucynta (swelling in LLE)  Sees Psychiatry rx with Prozac, patient reports that Dr. Horald Chestnutamo's was planning to start Effexor but could not take this in addition to her Prozac. Patient had other topical agents including topical lidocaine as well as a mixture of topical diclofenac and menthol.  [facility]  Pain Inventory Average Pain 7 Pain Right Now 8 My pain is sharp, burning, dull, stabbing, tingling and aching  In the last 24 hours, has pain interfered with the following? General activity 8 Relation with others 8 Enjoyment of life 8 What TIME of day is your pain at its worst? all Sleep (in general) Fair  Pain is worse with: walking, sitting, inactivity, standing and some activites Pain improves with: rest, therapy/exercise, pacing activities, medication and TENS Relief from Meds: 3  Mobility walk without assistance how many minutes can you walk? depends on the day ability to climb steps?  no do you drive?  yes  Function not employed: date last employed  n/a  Neuro/Psych tingling depression anxiety  Prior Studies Any changes since last visit?  no  Physicians involved in your care Any changes since last visit?  no   Family History  Adopted: Yes  Problem Relation Age of Onset  . Alcohol abuse Mother   . Depression Mother   . Drug abuse Mother   . Mental illness Mother   . Alcohol abuse Father   . Arthritis Father   . Asthma Father   . COPD Father   . Depression Father   . Diabetes Father   . Drug abuse Father   . Early death Father   . Heart disease Father   . Hyperlipidemia Father   . Hypertension Father   . Kidney disease Father   . Stroke Father   . Diabetes Brother   . Depression Brother   . Kidney cancer Neg Hx   . Bladder Cancer Neg Hx    Social History   Socioeconomic History  . Marital status: Single    Spouse name: Not on file  . Number of children: Not on file  . Years of education: Not on file  . Highest education level: Not on file  Occupational History    Employer: [employer]  Social Needs  . Financial resource strain: Not on file  . Food insecurity:    Worry: Not on file    Inability: Not on file  . Transportation needs:    Medical: Not on file    Non-medical: Not on file  Tobacco Use  . Smoking status: Never Smoker  . Smokeless tobacco: Never Used  Substance and Sexual Activity  . Alcohol use: No  . Drug use: No  . Sexual activity: Never    Birth control/protection: None  Lifestyle  . Physical activity:    Days per week: Not on file    Minutes per session: Not on file  . Stress: Not on file  Relationships  . Social connections:    Talks on phone: Not on file    Gets together: Not on file    Attends religious service: Not on file    Active member of club or organization: Not on file    Attends meetings of clubs or organizations: Not on file    Relationship status: Not on file  Other Topics Concern  . Not on file  Social History Narrative  . Not on file   Past Surgical  History:  Procedure Laterality Date  . COLPOSCOPY  2017   neg bx  . EXTRACORPOREAL SHOCK WAVE LITHOTRIPSY Right 04/26/2017   Procedure: EXTRACORPOREAL SHOCK WAVE LITHOTRIPSY (ESWL);  Surgeon: Vanna Scotland, MD;  Location: [facility] ORS;  Service: Urology;  Laterality: Right;  . KNEE ARTHROSCOPY Right   . TONSILLECTOMY     Past Medical History:  Diagnosis Date  . Allergy   . Anxiety   . Arthritis   . Asthma   . Breast pain present for several months   Bil LT >RT across of breasts  . CRPS (complex regional pain syndrome type I)   . Depression   . Kidney stones   . LGSIL on Pap smear of cervix 2007  . Migraine   . Neuromuscular disorder ([facility])    Complex regional pain syndrome and Fibromyalgia   Ht 5\' 8"  (1.727 m)   Wt 298 lb 9.6 oz (135.4 kg)   BMI 45.40 kg/m   Opioid Risk Score:   Fall Risk Score:  `1  Depression screen PHQ 2/9  No flowsheet data found.  Review of Systems  Unable to perform ROS: Acuity of condition  Constitutional: Negative.   HENT: Negative.   Eyes: Negative.   Respiratory: Negative.   Cardiovascular: Negative.   Gastrointestinal: Negative.   Endocrine: Negative.   Genitourinary: Negative.   Musculoskeletal: Negative.   Skin: Negative.   Allergic/Immunologic: Negative.   Neurological: Negative.   Hematological: Negative.   Psychiatric/Behavioral: Negative.   All other systems reviewed and are negative.      Objective:   Physical Exam  Constitutional: She is oriented to person, place, and time. She appears well-developed and well-nourished. No distress.  HENT:  Head: Normocephalic and atraumatic.  Right Ear: External ear normal.  Left Ear: External ear normal.  Eyes: Pupils are equal, round, and reactive to light. Conjunctivae and EOM are normal.  Neck: Normal range of motion. Neck supple.  Cardiovascular: Normal rate, regular rhythm, normal heart sounds and intact distal pulses.  No murmur heard. Pulmonary/Chest: Effort normal and breath  sounds normal. No stridor. No respiratory distress.  Abdominal: Soft. Bowel sounds are normal. She exhibits no distension. There is no tenderness.  Neurological: She is alert and oriented to person, place, and time.  Skin: Skin is warm and dry. She is not diaphoretic.  Psychiatric: She has a normal mood and affect.  Nursing note and vitals reviewed.    Right temp 77deg F, Left 1st MTP 77.3 deg F Mild erythema Left foot No swelling Hypersensitive to light pressure at Left MTPs, great toe, and med/lat malleolus     Assessment & Plan:  1.  Chronic left foot pain  after trauma approximately 2 years ago.  The patient meets minimal [place] criteria for reflex sympathetic dystrophy i.e. CRPS type I. She has been relatively well managed with topical agents.  At this point I would not recommend a lumbar sympathetic block. We discussed a regimen similar to that which she obtained at the office of Dr. Horald Chestnut. This would include diclofenac 1% gel applied 4 times per day to the foot in between these applications I would recommend Biofreeze. In addition she can purchase over-the-counter lidocaine patch 4% which can be applied when she has more severe pain.  Patient states that this is relatively  Infrequent  She will continue her outpatient therapy.   2.  Chronic pain with adjustment disorder.  She has worked relatively well with Dr. Gibson Ramp.  She would prefer not to commute to [facility] for visits and is wondering whether there is really anything new to do she has learned relaxation techniques.  We discussed that we do have a neuropsychologist at this office that can work with her as needed.  At this time I will not make referral but she will call if she would like to see him. Effexor may be a good treatment option for her however she may have sweating related to the increased norepinephrine just as she experience with Cymbalta as well as with Nucynta

## 2018-03-04 NOTE — Patient Instructions (Signed)
Please pick up Biofreeze and Lidocaine patches at pharmacy ( no Rx needed)

## 2018-03-05 DIAGNOSIS — R609 Edema, unspecified: Secondary | ICD-10-CM | POA: Insufficient documentation

## 2018-03-05 DIAGNOSIS — R600 Localized edema: Secondary | ICD-10-CM | POA: Insufficient documentation

## 2018-03-06 ENCOUNTER — Ambulatory Visit
Payer: No Typology Code available for payment source | Attending: Physical Medicine and Rehabilitation | Admitting: Physical Therapy

## 2018-03-06 ENCOUNTER — Encounter: Payer: Self-pay | Admitting: Physical Therapy

## 2018-03-06 DIAGNOSIS — M25572 Pain in left ankle and joints of left foot: Secondary | ICD-10-CM | POA: Insufficient documentation

## 2018-03-06 DIAGNOSIS — R262 Difficulty in walking, not elsewhere classified: Secondary | ICD-10-CM

## 2018-03-06 DIAGNOSIS — R269 Unspecified abnormalities of gait and mobility: Secondary | ICD-10-CM | POA: Insufficient documentation

## 2018-03-06 DIAGNOSIS — M25562 Pain in left knee: Secondary | ICD-10-CM | POA: Insufficient documentation

## 2018-03-06 DIAGNOSIS — M25561 Pain in right knee: Secondary | ICD-10-CM | POA: Insufficient documentation

## 2018-03-06 DIAGNOSIS — G8929 Other chronic pain: Secondary | ICD-10-CM

## 2018-03-06 DIAGNOSIS — M6281 Muscle weakness (generalized): Secondary | ICD-10-CM | POA: Insufficient documentation

## 2018-03-06 DIAGNOSIS — M79672 Pain in left foot: Secondary | ICD-10-CM

## 2018-03-06 NOTE — Therapy (Addendum)
[facility] [address] [place], [place], [address] Phone: [phone]   Fax:  [phone]  Physical Therapy Treatment  Patient Details  Name: Kelsey Bell MRN: [id number] Date of Birth: 12/09/1980 Referring Provider: Dr. Ethelene Halamos   Encounter Date: 03/06/2018  PT End of Session - 03/06/18 1742    Visit Number  26    Number of Visits  26    Date for PT Re-Evaluation  03/13/18    PT Start Time  1538    PT Stop Time  1644    PT Time Calculation (min)  66 min    Activity Tolerance  Patient limited by pain    Behavior During Therapy  [facility] for tasks assessed/performed       Past Medical History:  Diagnosis Date  . Allergy   . Anxiety   . Arthritis   . Asthma   . Breast pain present for several months   Bil LT >RT across of breasts  . CRPS (complex regional pain syndrome type I)   . Depression   . Kidney stones   . LGSIL on Pap smear of cervix 2007  . Migraine   . Neuromuscular disorder ([facility])    Complex regional pain syndrome and Fibromyalgia    Past Surgical History:  Procedure Laterality Date  . COLPOSCOPY  2017   neg bx  . EXTRACORPOREAL SHOCK WAVE LITHOTRIPSY Right 04/26/2017   Procedure: EXTRACORPOREAL SHOCK WAVE LITHOTRIPSY (ESWL);  Surgeon: Vanna ScotlandBrandon, Ashley, MD;  Location: [facility] ORS;  Service: Urology;  Laterality: Right;  . KNEE ARTHROSCOPY Right   . TONSILLECTOMY      There were no vitals filed for this visit.  Subjective Assessment - 03/06/18 1739    Subjective  Pt reports no activity over last week due to pet illnesses and stress. Still slightly sore from last week's session in L quad. Some R knee pain. Pt seeks to discontinue Topamax due to side effects (kidney stones) and will seek MD's advice. Reports success in managing pain with oral CBD.    Pertinent History  pt. known well to PT clinic.      Limitations  Standing;Walking;House hold activities;Other (comment)    Patient Stated Goals  Decrease knee/foot  pain.  Improve walking    Currently in Pain?  Yes    Pain Score  4     Pain Location  Ankle    Pain Orientation  Left    Pain Descriptors / Indicators  Aching    Pain Type  Chronic pain    Pain Onset  More than a month ago    Pain Frequency  Constant    Multiple Pain Sites  Yes    Pain Score  4    Pain Location  Knee    Pain Orientation  Right    Pain Descriptors / Indicators  Sharp;Stabbing    Pain Type  Chronic pain       TREATMENT  Warm-up: SCIFIT lv 5 x 10 min (unbilled)  Myofascial release to B quads, lateral hip, adductors at musculotendinous junction x 6 min for pain control. Foam rolling to B ITB and gluteal musculature with reduced weight bearing x 4 min for pain control, improvement of focal tissue tension ITB and adductor stretches 2 x 30 sec ea. Pt demonstrates ability to perform stretches correctly without PT assistance or cueing.  Hip abd strength assessed: 4-/5 sidelying. Sidelying hip abd B 1 x 10 to improve lateral hip strength - therapist cues  for correct technique (leg alignment) Standing hip abd B 2 x 10 to improve lateral hip strength - therapist cues for correct technique (leg alignment) B SLS 3 x 10 sec on firm ground, with eyes closed, foam, with UE perturbations and 1# medicine ball - pt demonstrates excellent B ankle strategies  Step-ups B 2 x 10 - pt demonstrates good strategies without knee deviation but mild impairments with eccentric step-down  Pt demonstrates achievement of all goals with ability to walk 10+ min without increased pain, 5/5 strength in BLE in hip flexion and knee ext/flex,   LEFS: 50/80; pt notes "quite a bit of difficulty" with stairs; SPT notes poor eccentric control descending and poor gluteal recruitment ascending contributing to increased force over patella and concordant knee pain  Pt instructed in eccentric quad taps from 3-in surface B x 10  Discussed pt goals at length and reviewed HEP  D/c to gym program      PT  Education - 03/06/18 1741    Education provided  Yes    Education Details  importance of reducing stress; d/c to gym program    Person(s) Educated  Patient    Methods  Explanation;Demonstration    Comprehension  Verbalized understanding;Returned demonstration          PT Long Term Goals - 03/06/18 1751      PT LONG TERM GOAL #1   Title  Pt. I with HEP to increase L ankle AROM to WNL as compared to R ankle to improve pain-free mobility.      Baseline  B ankle AROM WFL (pain focus with L ankle).      Time  4    Period  Weeks    Status  Achieved      PT LONG TERM GOAL #2   Title  Pt. will increase LEFS to >40 out of 80 to improve pain-free mobility/ return to work.      Baseline  LEFS: 21 out of 80 on 11/27; 09/26/17: 36/80; 11/14/17 37/80; 12/19/17 40/80.   On 5/15: 34 (varying fluctuations do to pain).  On 7/3: 50/80    Time  8    Period  Weeks    Status  Achieved      PT LONG TERM GOAL #3   Title  Pt. will increase B hip flexor/ quad. strengthening to 4+/5 MMT to improve standing tolerance/ walking with more normalized gait pattern.     Baseline  B LE muscle strength grossly 4/5 MMT (pain limited). 09/26/17: hip flexor 4/5, quad 4+/5 ; 11/14/17 hip flexor 4+/5, L quad 4/5, R quad 4+/5; 12/19/17 5/5 except L quad pain limited; on 7/3 5/5 in hip flex, quad    Time  8    Period  Weeks    Status  Achieved      PT LONG TERM GOAL #4   Title  Pt. will ambulate 10 min. with normalized gait pattern with no increase c/o knee/foot pain.    Baseline  Marked improvement in gait pattern but pain remains. On 7/3: pt able to walk 10 mins with no increased pain.    Time  8    Period  Weeks    Status  Achieved            Plan - 03/06/18 1743    Clinical Impression Statement  Pt reports with negative affect due to stress, pet illnesses, disputes with ex. Demonstrates good L ankle control on firm and complaint surfaces with SLS therex. Slightly  reduced neuropathic pain through dorsomedial  surface. Mild focal tightness throughout quads, adductors, ITB, and gluteal musculature secondary to weakness, responds well to rolling, stretching, and manual; intermittent knee pain on stairs and with exercise due to mild quad strength imbalance and moderate diminished eccentric control in tandem with hip weakness; all exacerbated by recent inactivity secondary to fracture. Pt has nonetheless achieved all goals and demonstrates good comprehension of hip/quad strengthening via gym program [employer]) and ability to self-manage progression. Additional skilled physical therapy is not indicated at this time. d/c to gym program.    Clinical Presentation  Evolving    Clinical Decision Making  Moderate    Rehab Potential  Fair    PT Frequency  Biweekly    PT Duration  8 weeks    PT Treatment/Interventions  ADLs/Self Care Home Management;Aquatic Therapy;Cryotherapy;Iontophoresis 4mg /ml Dexamethasone;Ultrasound;Patient/family education;Neuromuscular re-education;Balance training;Therapeutic exercise;Therapeutic activities;Functional mobility training;Stair training;Gait training;Manual techniques;Passive range of motion;Dry needling    PT Next Visit Plan  d/c to gym program    PT Home Exercise Plan  eccentric quads; sidelying hip abd    Consulted and Agree with Plan of Care  Patient       Patient will benefit from skilled therapeutic intervention in order to improve the following deficits and impairments:  Abnormal gait, Decreased strength, Increased muscle spasms, Postural dysfunction, Impaired perceived functional ability, Decreased activity tolerance, Decreased mobility, Impaired flexibility, Obesity, Decreased range of motion, Decreased balance, Pain, Decreased endurance  Visit Diagnosis: Pain in left foot  Gait difficulty  Muscle weakness (generalized)  Chronic pain of right knee  Chronic pain of left knee  Pain in left ankle and joints of left foot  Difficulty in walking, not  elsewhere classified     Problem List Patient Active Problem List   Diagnosis Date Noted  . Kidney stone 04/25/2017  . Ureteral stone    Cammie Mcgee, PT, DPT # [id number] Janee Morn, SPT 03/07/2018, 9:27 PM  [facility] [address] [place], [place], [address] Phone: [phone]   Fax:  [phone]  Name: Kelsey Bell MRN: [id number] Date of Birth: 22-Nov-1980

## 2018-04-09 ENCOUNTER — Encounter: Payer: Self-pay | Admitting: Physical Medicine & Rehabilitation

## 2018-04-16 ENCOUNTER — Ambulatory Visit
Admission: RE | Admit: 2018-04-16 | Discharge: 2018-04-16 | Disposition: A | Discharge status: Home / Self Care | Payer: No Typology Code available for payment source | Source: Ambulatory Visit | Attending: Urology | Admitting: Urology

## 2018-04-16 ENCOUNTER — Other Ambulatory Visit: Payer: Self-pay | Admitting: Urology

## 2018-04-16 DIAGNOSIS — Z87442 Personal history of urinary calculi: Secondary | ICD-10-CM | POA: Insufficient documentation

## 2018-04-16 DIAGNOSIS — N200 Calculus of kidney: Secondary | ICD-10-CM

## 2018-04-16 DIAGNOSIS — R109 Unspecified abdominal pain: Secondary | ICD-10-CM | POA: Insufficient documentation

## 2018-05-08 ENCOUNTER — Encounter: Payer: Self-pay | Admitting: Emergency Medicine

## 2018-05-08 ENCOUNTER — Ambulatory Visit
Admission: EM | Admit: 2018-05-08 | Discharge: 2018-05-08 | Disposition: A | Discharge status: Home / Self Care | Payer: No Typology Code available for payment source | Attending: Family Medicine | Admitting: Family Medicine

## 2018-05-08 ENCOUNTER — Ambulatory Visit (INDEPENDENT_AMBULATORY_CARE_PROVIDER_SITE_OTHER): Payer: No Typology Code available for payment source

## 2018-05-08 ENCOUNTER — Other Ambulatory Visit: Payer: Self-pay

## 2018-05-08 DIAGNOSIS — M25561 Pain in right knee: Secondary | ICD-10-CM

## 2018-05-08 MED ORDER — MELOXICAM 15 MG PO TABS: 15.0000 mg | ORAL_TABLET | Freq: Every day | ORAL | 0 refills | Status: DC | PRN

## 2018-05-08 MED ORDER — OXYCODONE-ACETAMINOPHEN 5-325 MG PO TABS: 1.0000 | ORAL_TABLET | Freq: Four times a day (QID) | ORAL | 0 refills | Status: DC | PRN

## 2018-05-08 NOTE — ED Triage Notes (Signed)
Patient c/o right knee pain that started last night. She stated she was standing on her knee and she felt a pop and now has bruising on her knee and very painful to walk.

## 2018-05-08 NOTE — ED Provider Notes (Signed)
[facility] ____________________________________________  Time seen: Approximately 10:13 AM  I have reviewed the triage vital signs and the nursing notes.   HISTORY  Chief Complaint Knee Pain  HPI Kelsey Bell is a 37 y.o. female presented for evaluation of right knee pain since yesterday.  Patient states that she has a history of osteoarthritis with previous arthroscopic with shaving to right knee but has not had any issues for the last several years.  States it always cracks and pops though.  States she recently was at the beach and had increased walking up sand dunes this past weekend and unsure if that aggravated something.  States yesterday she was standing in her kitchen suddenly felt and heard a pop to her right knee with associated pain.  States that she has been having painful to weight-bear since.  States pain is severe.  States pain occasionally radiates from need to up thigh.  Occasional tingling sensation to her toes but no loss of sensation.  Denies any actual fall or direct trauma.  No alleviating measures attempted prior to arrival.  States pain is worse with range of motion and direct palpation.  Denies other aggravating alleviating factors.  Able to weight-bear.  Does have crutches at home but has not yet used.  No previous history of blood clots.  Not on oral contraceptives.  Recent 4-hour beach trip, no other immobilization.  No history of cancer.  Non-smoker.  Continues to eat and drink well.  Denies other aggravating alleviating factors.  Denies chest pain, shortness of breath, abdominal pain, or rash. Denies recent sickness. Denies recent antibiotic use.   Clent Jacks, PA-C: PCP Denies pregnancy.   Past Medical History:  Diagnosis Date  . Allergy   . Anxiety   . Arthritis   . Asthma   . Breast pain present for several months   Bil LT >RT across of breasts  . CRPS (complex regional pain syndrome type I)   . Depression   . Kidney stones   .  LGSIL on Pap smear of cervix 2007  . Migraine   . Neuromuscular disorder (HCC)    Complex regional pain syndrome and Fibromyalgia    Patient Active Problem List   Diagnosis Date Noted  . Kidney stone 04/25/2017  . Ureteral stone     Past Surgical History:  Procedure Laterality Date  . COLPOSCOPY  2017   neg bx  . EXTRACORPOREAL SHOCK WAVE LITHOTRIPSY Right 04/26/2017   Procedure: EXTRACORPOREAL SHOCK WAVE LITHOTRIPSY (ESWL);  Surgeon: Vanna Scotland, MD;  Location: [facility] ORS;  Service: Urology;  Laterality: Right;  . KNEE ARTHROSCOPY Right   . TONSILLECTOMY       No current facility-administered medications for this encounter.   Current Outpatient Medications:  .  acetaminophen (TYLENOL) 500 MG tablet, Take 1,000 mg by mouth every evening. , Disp: , Rfl:  .  albuterol (PROVENTIL HFA;VENTOLIN HFA) 108 (90 Base) MCG/ACT inhaler, Inhale 1 puff into the lungs as needed for wheezing or shortness of breath., Disp: , Rfl:  .  ALPRAZolam (XANAX) 0.25 MG tablet, Take 0.25 mg by mouth as needed for anxiety., Disp: , Rfl:  .  budesonide-formoterol (SYMBICORT) 160-4.5 MCG/ACT inhaler, Inhale 2 puffs into the lungs 2 (two) times daily. Using as needed, Disp: , Rfl:  .  Cholecalciferol (VITAMIN D3) 5000 units TABS, Take 10,000 Units by mouth daily. , Disp: , Rfl:  .  FLUoxetine (PROZAC) 40 MG capsule, Take 80 mg by mouth daily., Disp: , Rfl:  .  fluticasone (FLONASE) 50 MCG/ACT nasal spray, Place 2 sprays into the nose 2 (two) times daily., Disp: , Rfl:  .  ipratropium-albuterol (DUONEB) 0.5-2.5 (3) MG/3ML SOLN, Take 3 mLs by nebulization as needed., Disp: , Rfl:  .  Lidocaine-Menthol 4-4 % PTCH, Apply 1 patch topically every 12 (twelve) hours., Disp: , Rfl:  .  montelukast (SINGULAIR) 10 MG tablet, Take 10 mg by mouth at bedtime., Disp: , Rfl:  .  Multiple Vitamin (MULTI-VITAMINS) TABS, Take 1 tablet by mouth daily., Disp: , Rfl:  .  ondansetron (ZOFRAN) 8 MG tablet, Take 1 tablet (8 mg total)  by mouth every 8 (eight) hours as needed for nausea or vomiting., Disp: 20 tablet, Rfl: 0 .  tamsulosin (FLOMAX) 0.4 MG CAPS capsule, Take 1 capsule (0.4 mg total) by mouth daily., Disp: 30 capsule, Rfl: 0 .  calcium carbonate (OS-CAL) 600 MG TABS tablet, Take 600 mg by mouth daily., Disp: , Rfl:  .  meloxicam (MOBIC) 15 MG tablet, Take 1 tablet (15 mg total) by mouth daily as needed., Disp: 10 tablet, Rfl: 0 .  oxyCODONE-acetaminophen (PERCOCET/ROXICET) 5-325 MG tablet, Take 1 tablet by mouth every 6 (six) hours as needed for severe pain. Do not drive while taking as can cause drowsiness., Disp: 6 tablet, Rfl: 0  Allergies Cefdinir; Esomeprazole; Esomeprazole magnesium; Gabapentin; Hydroxychloroquine; Tapentadol; Nsaids; Sumatriptan succinate; Cortisone; Duloxetine; Prednisone; Amoxicillin; Amoxicillin-pot clavulanate; Reglan [metoclopramide]; Sulfa antibiotics; and Sulfasalazine  Family History  Adopted: Yes  Problem Relation Age of Onset  . Alcohol abuse Mother   . Depression Mother   . Drug abuse Mother   . Mental illness Mother   . Alcohol abuse Father   . Arthritis Father   . Asthma Father   . COPD Father   . Depression Father   . Diabetes Father   . Drug abuse Father   . Early death Father   . Heart disease Father   . Hyperlipidemia Father   . Hypertension Father   . Kidney disease Father   . Stroke Father   . Diabetes Brother   . Depression Brother   . Kidney cancer Neg Hx   . Bladder Cancer Neg Hx     Social History Social History   Tobacco Use  . Smoking status: Never Smoker  . Smokeless tobacco: Never Used  Substance Use Topics  . Alcohol use: No  . Drug use: No    Review of Systems Constitutional: No fever/chills Cardiovascular: Denies chest pain. Respiratory: Denies shortness of breath. Gastrointestinal: No abdominal pain.   Musculoskeletal: Negative for back pain. As above.  Skin: Negative for  rash.   ____________________________________________   PHYSICAL EXAM:  VITAL SIGNS: ED Triage Vitals  Enc Vitals Group     BP 05/08/18 0931 (!) 146/97     Pulse Rate 05/08/18 0931 78     Resp 05/08/18 0931 16     Temp 05/08/18 0931 98.5 F (36.9 C)     Temp Source 05/08/18 0931 Oral     SpO2 05/08/18 0931 99 %     Weight 05/08/18 0931 260 lb (117.9 kg)     Height 05/08/18 0931 5\' 8"  (1.727 m)     Head Circumference --      Peak Flow --      Pain Score 05/08/18 0930 8     Pain Loc --      Pain Edu? --      Excl. in GC? --     Constitutional: Alert and oriented. Well  appearing and in no acute distress. ENT      Head: Normocephalic and atraumatic. Cardiovascular: Normal rate, regular rhythm. Grossly normal heart sounds.  Good peripheral circulation. Respiratory: Normal respiratory effort without tachypnea nor retractions. Breath sounds are clear and equal bilaterally. No wheezes, rales, rhonchi. Musculoskeletal:   Bilateral pedal pulses equal and easily palpated. Except: Right anterior lateral knee diffuse moderate tenderness to direct palpation, mild pain with anterior drawer test, pain with posterior drawer test, mild pain with medial and lateral stress, pain with knee extension but able to fully extend, no pain with knee flexion, mild tenderness along IT band, and minimal posterior proximal calf tenderness, no obvious knee edema, no obvious edema to right lower extremity, reports slight paresthesia to right distal toe, normal capillary refill.  Gait not tested due to pain. Neurologic:  Normal speech and language. No gross focal neurologic deficits are appreciated. Speech is normal.  Skin:  Skin is warm, dry and intact. No rash noted. Psychiatric: Mood and affect are normal. Speech and behavior are normal. Patient exhibits appropriate insight and judgment   ___________________________________________   LABS (all labs ordered are listed, but only abnormal results are  displayed)  Labs Reviewed - No data to display  RADIOLOGY  Dg Knee Complete 4 Views Right  Result Date: 05/08/2018 CLINICAL DATA:  Knee pain. Heard a pop in the right knee this morning. Unable to stand. Initial encounter. EXAM: RIGHT KNEE - COMPLETE 4+ VIEW COMPARISON:  02/27/2017 FINDINGS: There is no evidence of fracture, dislocation, or knee joint effusion. Mild medial compartment joint space narrowing is noted. The soft tissues are unremarkable. IMPRESSION: No acute osseous abnormality. Electronically Signed   By: Sebastian Ache M.D.   On: 05/08/2018 11:12   ____________________________________________   PROCEDURES Procedures   INITIAL IMPRESSION / ASSESSMENT AND PLAN / ED COURSE  Pertinent labs & imaging results that were available during my care of the patient were reviewed by me and considered in my medical decision making (see chart for details).  Well-appearing patient.  No acute distress.  Presented for evaluation of right knee pain post popping when standing on the yesterday.  No direct trauma.  History of issues to see me in the past.  Patient with diffuse tenderness to right knee as well as calf and thigh, no significant risk factors for DVT, discussed evaluation of ultrasound at this time.  Patient declined ultrasound and verbalized understanding.  Right knee x-ray evaluated.  Right knee x-ray as above, reviewed by myself, no acute osseous abnormality.  Concern for ligamentous meniscal injury.  Patient reporting diffuse pain with any movement.  Knee immobilizer applied for support, continued use of crutches at home.  Patient states that there are some NSAID she can tolerate, stating that she can tolerate Mobic fine, Rx for Mobic given.  Quantity 6 Percocet given as needed for breakthrough pain. No chronic controlled substance database reviewed. Discussed indication, risks and benefits of medications with patient.  Follow-up with orthopedic.   Discussed follow up and return  parameters including no resolution or any worsening concerns. Patient verbalized understanding and agreed to plan.   ____________________________________________   FINAL CLINICAL IMPRESSION(S) / ED DIAGNOSES  Final diagnoses:  Acute pain of right knee     ED Discharge Orders         Ordered    meloxicam (MOBIC) 15 MG tablet  Daily PRN     05/08/18 1115    oxyCODONE-acetaminophen (PERCOCET/ROXICET) 5-325 MG tablet  Every 6 hours PRN  05/08/18 1115           Note: This dictation was prepared with Dragon dictation along with smaller phrase technology. Any transcriptional errors that result from this process are unintentional.         Renford Dills, NP 05/08/18 1147

## 2018-05-08 NOTE — Discharge Instructions (Addendum)
Take medication as prescribed. Rest. Drink plenty of fluids.  Apply ice and elevate.  Use knee immobilizer and crutches for support.  Follow-up with orthopedic in 3 to 5 days as discussed.  See above to call today to schedule.  Follow up with your primary care physician this week as needed. Return to Urgent care for new or worsening concerns.

## 2018-05-16 ENCOUNTER — Telehealth: Payer: Self-pay

## 2018-05-16 NOTE — Telephone Encounter (Signed)
Noted. Spoke with patient and [facility].-Aelyn Stanaland V Blayden Conwell, RMA

## 2018-05-16 NOTE — Telephone Encounter (Signed)
We discussed this case face to face.

## 2018-05-16 NOTE — Telephone Encounter (Signed)
Copied from CRM [phone]. Topic: Quick Communication - See Telephone Encounter >> May 16, 2018 12:14 PM Herby AbrahamJohnson, Shiquita C wrote: CRM for notification. See Telephone encounter for: 05/16/18.  Pt called in to schedule a np apt. Pt says that qualify for financial assistance program due to her current condition. Not sure of financial programs and scheduling. Please assist further.   CB: [phone]: [phone]  Sp[facility] with patient [facility] [place]. She is having knee issue and needs to see a [facility] or [facility] or ortho provider for this. She has [facility] Patient Assistance charity care coverage and [facility] coverage. Can she come and see you for knee issue and we file the visit with the assistance program? Thank Neta Mendsyou-Shawnetta Lein V Ved Martos, RMA

## 2018-05-17 ENCOUNTER — Encounter: Payer: Self-pay | Admitting: Family Medicine

## 2018-05-17 ENCOUNTER — Ambulatory Visit (INDEPENDENT_AMBULATORY_CARE_PROVIDER_SITE_OTHER): Payer: Self-pay | Admitting: Family Medicine

## 2018-05-17 ENCOUNTER — Ambulatory Visit: Payer: Self-pay

## 2018-05-17 VITALS — BP 132/68 | HR 98 | Ht 68.0 in

## 2018-05-17 DIAGNOSIS — M25561 Pain in right knee: Secondary | ICD-10-CM

## 2018-05-17 MED ORDER — DICLOFENAC SODIUM 2 % TD SOLN: 1.0000 "application " | Freq: Two times a day (BID) | TRANSDERMAL | 3 refills | Status: DC

## 2018-05-17 MED ORDER — IBUPROFEN-FAMOTIDINE 800-26.6 MG PO TABS: 1.0000 | ORAL_TABLET | Freq: Three times a day (TID) | ORAL | 3 refills | Status: DC

## 2018-05-17 NOTE — Patient Instructions (Signed)
Nice to meet you  Please try to ice your knee  Please come out of the immobilizer and use it when you need to Please try the medications  Please seek me back in 3-4 weeks if your pain isn't improving.

## 2018-05-17 NOTE — Progress Notes (Signed)
Kelsey Bell - 37 y.o. female MRN [id number]  Date of birth: Jun 27, 1981  SUBJECTIVE:  Including CC & ROS.  Chief Complaint  Patient presents with  . Knee Pain    Kelsey Bell is a 37 y.o. female that is presenting with right knee pain. Two weeks ago she was getting ready for bed and felt a pop in her knee. Denies sustaining a fall or loosing her balance. Swelling and tenderness present. Severe pain bear weight. She is unable to bend her knee.  She has pain that is anterior as well as all over her knee.  Denies any radicular type symptoms.  Has a history of surgery in the knee.  She was seen at the Urgent Care in [facility] on 05/08/18-she was placed in a knee immobilizer, continued use of crutches at home.    Independent review of the right knee x-ray from 9/4 shows a normal exam.   Review of Systems  Constitutional: Negative for fever.  HENT: Negative for congestion.   Respiratory: Negative for cough.   Cardiovascular: Negative for chest pain.  Gastrointestinal: Negative for abdominal distention.  Musculoskeletal: Positive for gait problem.  Skin: Negative for color change.  Neurological: Negative for weakness.  Hematological: Negative for adenopathy.  Psychiatric/Behavioral: Negative for agitation.    HISTORY: Past Medical, Surgical, Social, and Family History Reviewed & Updated per EMR.   Pertinent Historical Findings include:  Past Medical History:  Diagnosis Date  . Allergy   . Anxiety   . Arthritis   . Asthma   . Breast pain present for several months   Bil LT >RT across of breasts  . CRPS (complex regional pain syndrome type I)   . Depression   . Kidney stones   . LGSIL on Pap smear of cervix 2007  . Migraine   . Neuromuscular disorder (HCC)    Complex regional pain syndrome and Fibromyalgia    Past Surgical History:  Procedure Laterality Date  . COLPOSCOPY  2017   neg bx  . EXTRACORPOREAL SHOCK WAVE LITHOTRIPSY Right 04/26/2017   Procedure: EXTRACORPOREAL  SHOCK WAVE LITHOTRIPSY (ESWL);  Surgeon: Vanna Scotland, MD;  Location: [facility] OR[facility];  Service: Urology;  Laterality: Right;  . KNEE ARTHROSCOPY Right   . TONSILLECTOMY      Allergies  Allergen Reactions  . Cefdinir Hives  . Esomeprazole Anaphylaxis  . Esomeprazole Magnesium Anaphylaxis  . Gabapentin Swelling  . Hydroxychloroquine Rash and Shortness Of Breath  . Tapentadol Other (See Comments)    Other reaction(s): Other (See Comments) Body aches and pains, and itching. Swelling.   HEADACHES Body aches and pains, and itching. Swelling. Body aches and pains, and itching. Swelling.  . Nsaids Nausea Only and Other (See Comments)    Other: acid reflux  . Sumatriptan Succinate Other (See Comments)  . Cortisone Other (See Comments)    pain pain  . Duloxetine Other (See Comments)    Hot flashed, sweating  . Prednisone Other (See Comments)    Muscle spasms  . Amoxicillin Rash  . Amoxicillin-Pot Clavulanate Nausea And Vomiting and Other (See Comments)    GI symptoms and UTI  . Reglan [Metoclopramide] Anxiety  . Sulfa Antibiotics Rash  . Sulfasalazine Rash    Family History  Adopted: Yes  Problem Relation Age of Onset  . Alcohol abuse Mother   . Depression Mother   . Drug abuse Mother   . Mental illness Mother   . Alcohol abuse Father   . Arthritis Father   .  Asthma Father   . COPD Father   . Depression Father   . Diabetes Father   . Drug abuse Father   . Early death Father   . Heart disease Father   . Hyperlipidemia Father   . Hypertension Father   . Kidney disease Father   . Stroke Father   . Diabetes Brother   . Depression Brother   . Kidney cancer Neg Hx   . Bladder Cancer Neg Hx      Social History   Socioeconomic History  . Marital status: Single    Spouse name: Not on file  . Number of children: Not on file  . Years of education: Not on file  . Highest education level: Not on file  Occupational History    Employer: [employer]  Social Needs  .  Financial resource strain: Not on file  . Food insecurity:    Worry: Not on file    Inability: Not on file  . Transportation needs:    Medical: Not on file    Non-medical: Not on file  Tobacco Use  . Smoking status: Never Smoker  . Smokeless tobacco: Never Used  Substance and Sexual Activity  . Alcohol use: No  . Drug use: No  . Sexual activity: Never    Birth control/protection: None  Lifestyle  . Physical activity:    Days per week: Not on file    Minutes per session: Not on file  . Stress: Not on file  Relationships  . Social connections:    Talks on phone: Not on file    Gets together: Not on file    Attends religious service: Not on file    Active member of club or organization: Not on file    Attends meetings of clubs or organizations: Not on file    Relationship status: Not on file  . Intimate partner violence:    Fear of current or ex partner: Not on file    Emotionally abused: Not on file    Physically abused: Not on file    Forced sexual activity: Not on file  Other Topics Concern  . Not on file  Social History Narrative  . Not on file     PHYSICAL EXAM:  VS: BP 132/68   Pulse 98   Ht 5\' 8"  (1.727 m)   LMP 04/26/2018 (Exact Date)   SpO2 98%   BMI 39.53 kg/m  Physical Exam Gen: NAD, alert, cooperative with exam, well-appearing ENT: normal lips, normal nasal mucosa,  Eye: normal EOM, normal conjunctiva and lids CV:  no edema, +2 pedal pulses   Resp: no accessory muscle use, non-labored,  Skin: no rashes, no areas of induration  Neuro: normal tone, normal sensation to touch Psych:  normal insight, alert and oriented MSK:  Right knee: No obvious effusion. Tenderness to palpation over the anterior, medial, and lateral aspects of the knee. Pain with flexion and extension. Pain with patellar grind. Pain with McMurray's testing. Exam limited secondary to pain. Neurovascularly intact.   Limited ultrasound: Right knee:  No obvious  effusion. Normal-appearing quadricep patellar tendon. Normal-appearing lateral joint line meniscus. Medial joint line with mild narrowing normal-appearing meniscus. Normal-appearing MCL and LCL.  Summary: Normal exam with mild degenerative change of the medial joint line.  Ultrasound and interpretation by Clare GandyJeremy Schmitz, MD    ASSESSMENT & PLAN:   Right knee pain Structurally on x-ray and ultrasound her knee looks normal.  On exam she has significant pain with any  form of examination.  Possible to be a sprain but no effusion in the knee whatsoever. -Was advised to come out of the knee immobilizer if she is able.  She can use it if she has significant pain. -Duexis and Pennsaid. -Counseled on home exercise therapy. -Counseled on supportive care. -If no improvement can consider physical therapy and injection.

## 2018-05-19 DIAGNOSIS — M25561 Pain in right knee: Secondary | ICD-10-CM | POA: Insufficient documentation

## 2018-05-19 NOTE — Assessment & Plan Note (Signed)
Structurally on x-ray and ultrasound her knee looks normal.  On exam she has significant pain with any form of examination.  Possible to be a sprain but no effusion in the knee whatsoever. -Was advised to come out of the knee immobilizer if she is able.  She can use it if she has significant pain. -Duexis and Pennsaid. -Counseled on home exercise therapy. -Counseled on supportive care. -If no improvement can consider physical therapy and injection.

## 2018-05-23 ENCOUNTER — Encounter: Payer: Self-pay | Admitting: Family Medicine

## 2018-05-24 ENCOUNTER — Other Ambulatory Visit: Payer: Self-pay | Admitting: Family Medicine

## 2018-05-27 ENCOUNTER — Inpatient Hospital Stay
Admission: EM | Admit: 2018-05-27 | Discharge: 2018-06-06 | DRG: 439 | Disposition: A | Discharge status: Home / Self Care | Payer: Self-pay | Attending: Internal Medicine | Admitting: Internal Medicine

## 2018-05-27 ENCOUNTER — Emergency Department: Payer: Self-pay

## 2018-05-27 ENCOUNTER — Other Ambulatory Visit: Payer: Self-pay

## 2018-05-27 ENCOUNTER — Encounter: Payer: Self-pay | Admitting: Emergency Medicine

## 2018-05-27 DIAGNOSIS — F329 Major depressive disorder, single episode, unspecified: Secondary | ICD-10-CM | POA: Diagnosis present

## 2018-05-27 DIAGNOSIS — K859 Acute pancreatitis without necrosis or infection, unspecified: Principal | ICD-10-CM | POA: Diagnosis present

## 2018-05-27 DIAGNOSIS — Z8261 Family history of arthritis: Secondary | ICD-10-CM

## 2018-05-27 DIAGNOSIS — Z79899 Other long term (current) drug therapy: Secondary | ICD-10-CM

## 2018-05-27 DIAGNOSIS — Z818 Family history of other mental and behavioral disorders: Secondary | ICD-10-CM

## 2018-05-27 DIAGNOSIS — Z8249 Family history of ischemic heart disease and other diseases of the circulatory system: Secondary | ICD-10-CM

## 2018-05-27 DIAGNOSIS — Z813 Family history of other psychoactive substance abuse and dependence: Secondary | ICD-10-CM

## 2018-05-27 DIAGNOSIS — R109 Unspecified abdominal pain: Secondary | ICD-10-CM

## 2018-05-27 DIAGNOSIS — M797 Fibromyalgia: Secondary | ICD-10-CM | POA: Diagnosis present

## 2018-05-27 DIAGNOSIS — Z880 Allergy status to penicillin: Secondary | ICD-10-CM

## 2018-05-27 DIAGNOSIS — Z841 Family history of disorders of kidney and ureter: Secondary | ICD-10-CM

## 2018-05-27 DIAGNOSIS — M1990 Unspecified osteoarthritis, unspecified site: Secondary | ICD-10-CM | POA: Diagnosis present

## 2018-05-27 DIAGNOSIS — Z825 Family history of asthma and other chronic lower respiratory diseases: Secondary | ICD-10-CM

## 2018-05-27 DIAGNOSIS — G9050 Complex regional pain syndrome I, unspecified: Secondary | ICD-10-CM | POA: Diagnosis present

## 2018-05-27 DIAGNOSIS — Z791 Long term (current) use of non-steroidal anti-inflammatories (NSAID): Secondary | ICD-10-CM

## 2018-05-27 DIAGNOSIS — Z886 Allergy status to analgesic agent status: Secondary | ICD-10-CM

## 2018-05-27 DIAGNOSIS — Z881 Allergy status to other antibiotic agents status: Secondary | ICD-10-CM

## 2018-05-27 DIAGNOSIS — Z87442 Personal history of urinary calculi: Secondary | ICD-10-CM

## 2018-05-27 DIAGNOSIS — Z8349 Family history of other endocrine, nutritional and metabolic diseases: Secondary | ICD-10-CM

## 2018-05-27 DIAGNOSIS — Z823 Family history of stroke: Secondary | ICD-10-CM

## 2018-05-27 DIAGNOSIS — Z888 Allergy status to other drugs, medicaments and biological substances status: Secondary | ICD-10-CM

## 2018-05-27 DIAGNOSIS — F419 Anxiety disorder, unspecified: Secondary | ICD-10-CM | POA: Diagnosis present

## 2018-05-27 DIAGNOSIS — J45909 Unspecified asthma, uncomplicated: Secondary | ICD-10-CM | POA: Diagnosis present

## 2018-05-27 DIAGNOSIS — Z833 Family history of diabetes mellitus: Secondary | ICD-10-CM

## 2018-05-27 DIAGNOSIS — Z6841 Body Mass Index (BMI) 40.0 and over, adult: Secondary | ICD-10-CM

## 2018-05-27 DIAGNOSIS — Z7951 Long term (current) use of inhaled steroids: Secondary | ICD-10-CM

## 2018-05-27 DIAGNOSIS — Z811 Family history of alcohol abuse and dependence: Secondary | ICD-10-CM

## 2018-05-27 DIAGNOSIS — Z882 Allergy status to sulfonamides status: Secondary | ICD-10-CM

## 2018-05-27 LAB — LIPASE, BLOOD: Lipase: 79 U/L — ABNORMAL HIGH (ref 11–51)

## 2018-05-27 LAB — URINALYSIS, COMPLETE (UACMP) WITH MICROSCOPIC
Bilirubin Urine: NEGATIVE
Glucose, UA: NEGATIVE mg/dL
Ketones, ur: NEGATIVE mg/dL
Leukocytes, UA: NEGATIVE
Nitrite: NEGATIVE
Protein, ur: 30 mg/dL — AB
Specific Gravity, Urine: 1.02 (ref 1.005–1.030)
pH: 6 (ref 5.0–8.0)

## 2018-05-27 LAB — POCT PREGNANCY, URINE: Preg Test, Ur: NEGATIVE

## 2018-05-27 LAB — COMPREHENSIVE METABOLIC PANEL
ALK PHOS: 77 U/L (ref 38–126)
ALT: 13 U/L (ref 0–44)
AST: 14 U/L — AB (ref 15–41)
Albumin: 4.2 g/dL (ref 3.5–5.0)
Anion gap: 11 (ref 5–15)
BILIRUBIN TOTAL: 0.6 mg/dL (ref 0.3–1.2)
BUN: 10 mg/dL (ref 6–20)
CO2: 22 mmol/L (ref 22–32)
CREATININE: 0.64 mg/dL (ref 0.44–1.00)
Calcium: 9 mg/dL (ref 8.9–10.3)
Chloride: 102 mmol/L (ref 98–111)
GFR calc Af Amer: >60 mL/min (ref >60)
Glucose, Bld: 145 mg/dL — ABNORMAL HIGH (ref 70–99)
Potassium: 3.7 mmol/L (ref 3.5–5.1)
Sodium: 135 mmol/L (ref 135–145)
TOTAL PROTEIN: 8.1 g/dL (ref 6.5–8.1)

## 2018-05-27 LAB — CBC
HEMATOCRIT: 36.8 % (ref 35.0–47.0)
Hemoglobin: 12.7 g/dL (ref 12.0–16.0)
MCH: 30.9 pg (ref 26.0–34.0)
MCHC: 34.7 g/dL (ref 32.0–36.0)
MCV: 89 fL (ref 80.0–100.0)
PLATELETS: 468 10*3/uL — AB (ref 150–440)
RBC: 4.13 MIL/uL (ref 3.80–5.20)
RDW: 13.6 % (ref 11.5–14.5)
WBC: 28.3 10*3/uL — AB (ref 3.6–11.0)

## 2018-05-27 MED ORDER — MONTELUKAST SODIUM 10 MG PO TABS
10.0000 mg | ORAL_TABLET | Freq: Every day | ORAL | Status: DC
  Administered 2018-05-27 – 2018-06-05 (×6): 10 mg via ORAL
  Filled 2018-05-27 (×8): qty 1

## 2018-05-27 MED ORDER — ONDANSETRON HCL 4 MG PO TABS
4.0000 mg | ORAL_TABLET | Freq: Four times a day (QID) | ORAL | Status: DC | PRN
  Administered 2018-05-29: 10:00:00 4 mg via ORAL
  Filled 2018-05-27: qty 1

## 2018-05-27 MED ORDER — ONDANSETRON HCL 4 MG/2ML IJ SOLN
4.0000 mg | Freq: Once | INTRAMUSCULAR | Status: AC
  Administered 2018-05-27: 4 mg via INTRAVENOUS

## 2018-05-27 MED ORDER — MORPHINE SULFATE (PF) 2 MG/ML IV SOLN
2.0000 mg | INTRAVENOUS | Status: DC | PRN
  Administered 2018-05-28 (×3): 2 mg via INTRAVENOUS
  Filled 2018-05-27 (×3): qty 1

## 2018-05-27 MED ORDER — FLUOXETINE HCL 20 MG PO CAPS
80.0000 mg | ORAL_CAPSULE | Freq: Every day | ORAL | Status: DC
  Administered 2018-05-27 – 2018-06-05 (×6): 80 mg via ORAL
  Filled 2018-05-27 (×11): qty 4

## 2018-05-27 MED ORDER — SALINE SPRAY 0.65 % NA SOLN
1.0000 | Freq: Every day | NASAL | Status: DC
  Administered 2018-05-27 – 2018-06-05 (×9): 1 via NASAL
  Filled 2018-05-27: qty 44

## 2018-05-27 MED ORDER — MORPHINE SULFATE (PF) 4 MG/ML IV SOLN
4.0000 mg | Freq: Once | INTRAVENOUS | Status: AC
  Administered 2018-05-27: 4 mg via INTRAVENOUS
  Filled 2018-05-27: qty 1

## 2018-05-27 MED ORDER — ACETAMINOPHEN 325 MG PO TABS
650.0000 mg | ORAL_TABLET | Freq: Four times a day (QID) | ORAL | Status: DC | PRN
  Administered 2018-05-28: 21:00:00 650 mg via ORAL
  Filled 2018-05-27 (×3): qty 2

## 2018-05-27 MED ORDER — MORPHINE SULFATE (PF) 4 MG/ML IV SOLN
INTRAVENOUS | Status: AC
  Administered 2018-05-27: 4 mg via INTRAVENOUS
  Filled 2018-05-27: qty 1

## 2018-05-27 MED ORDER — FLUTICASONE PROPIONATE 50 MCG/ACT NA SUSP
2.0000 | Freq: Every evening | NASAL | Status: DC | PRN
  Administered 2018-05-28 – 2018-06-05 (×7): 2 via NASAL
  Filled 2018-05-27 (×3): qty 16

## 2018-05-27 MED ORDER — MORPHINE SULFATE (PF) 4 MG/ML IV SOLN
4.0000 mg | Freq: Once | INTRAVENOUS | Status: AC
  Administered 2018-05-27: 4 mg via INTRAVENOUS

## 2018-05-27 MED ORDER — PROMETHAZINE HCL 25 MG/ML IJ SOLN
25.0000 mg | Freq: Once | INTRAMUSCULAR | Status: AC
  Administered 2018-05-27: 25 mg via INTRAVENOUS
  Filled 2018-05-27: qty 1

## 2018-05-27 MED ORDER — HYDROCODONE-ACETAMINOPHEN 5-325 MG PO TABS
1.0000 | ORAL_TABLET | ORAL | Status: DC | PRN
  Administered 2018-05-27 – 2018-06-06 (×15): 2 via ORAL
  Filled 2018-05-27 (×15): qty 2

## 2018-05-27 MED ORDER — ONDANSETRON HCL 4 MG/2ML IJ SOLN
INTRAMUSCULAR | Status: AC
  Administered 2018-05-27: 4 mg via INTRAVENOUS
  Filled 2018-05-27: qty 2

## 2018-05-27 MED ORDER — SODIUM CHLORIDE 0.9 % IV SOLN
INTRAVENOUS | Status: DC
  Administered 2018-05-27 – 2018-05-29 (×5): via INTRAVENOUS

## 2018-05-27 MED ORDER — ONDANSETRON HCL 4 MG/2ML IJ SOLN
4.0000 mg | Freq: Four times a day (QID) | INTRAMUSCULAR | Status: DC | PRN
  Administered 2018-05-28 – 2018-06-05 (×10): 4 mg via INTRAVENOUS
  Filled 2018-05-27 (×12): qty 2

## 2018-05-27 MED ORDER — SODIUM CHLORIDE 0.9 % IV BOLUS
1000.0000 mL | Freq: Once | INTRAVENOUS | Status: AC
  Administered 2018-05-27: 1000 mL via INTRAVENOUS

## 2018-05-27 MED ORDER — ACETAMINOPHEN 650 MG RE SUPP: 650.0000 mg | Freq: Four times a day (QID) | RECTAL | Status: DC | PRN

## 2018-05-27 MED ORDER — ENOXAPARIN SODIUM 40 MG/0.4ML ~~LOC~~ SOLN
40.0000 mg | SUBCUTANEOUS | Status: DC
  Filled 2018-05-27: qty 0.4

## 2018-05-27 NOTE — ED Notes (Signed)
Pt leaving unit for CT.

## 2018-05-27 NOTE — ED Notes (Signed)
Floor RN Maralyn SagoSarah took report.

## 2018-05-27 NOTE — ED Provider Notes (Signed)
[facility] Emergency Department Provider Note  Time seen: 4:12 PM  I have reviewed the triage vital signs and the nursing notes.   HISTORY  Chief Complaint Abdominal Pain and Nausea    HPI Kelsey Bell is a 37 y.o. female with a past medical history of asthma, arthritis, depression, kidney stones, presents to the emergency department for epigastric and lower abdominal pain.  According to the patient for the past 3 days she has been experiencing pain across her epigastrium wrapping around into her lower abdomen.  States nausea but denies any vomiting.  Denies diarrhea.  She denies fever.  States abdominal pain is fairly constant but severity will wax and wane.  Describes it more as an aching type pain.  States she recently started her period but denies any vaginal discharge prior to her menstrual cycle.   Past Medical History:  Diagnosis Date  . Allergy   . Anxiety   . Arthritis   . Asthma   . Breast pain present for several months   Bil LT >RT across of breasts  . CRPS (complex regional pain syndrome type I)   . Depression   . Kidney stones   . LGSIL on Pap smear of cervix 2007  . Migraine   . Neuromuscular disorder (HCC)    Complex regional pain syndrome and Fibromyalgia    Patient Active Problem List   Diagnosis Date Noted  . Right knee pain 05/19/2018  . Kidney stone 04/25/2017  . Ureteral stone     Past Surgical History:  Procedure Laterality Date  . COLPOSCOPY  2017   neg bx  . EXTRACORPOREAL SHOCK WAVE LITHOTRIPSY Right 04/26/2017   Procedure: EXTRACORPOREAL SHOCK WAVE LITHOTRIPSY (ESWL);  Surgeon: Vanna Scotland, MD;  Location: [facility] ORS;  Service: Urology;  Laterality: Right;  . KNEE ARTHROSCOPY Right   . TONSILLECTOMY      Prior to Admission medications   Medication Sig Start Date End Date Taking? Authorizing Provider  albuterol (PROVENTIL HFA;VENTOLIN HFA) 108 (90 Base) MCG/ACT inhaler Inhale 1 puff into the lungs as needed for  wheezing or shortness of breath.    Mertie Moores, MD  ALPRAZolam Prudy Feeler) 0.25 MG tablet Take 0.25 mg by mouth as needed for anxiety.    [provider]  budesonide-formoterol (SYMBICORT) 160-4.5 MCG/ACT inhaler Inhale 2 puffs into the lungs 2 (two) times daily. Using as needed    Mertie Moores, MD  calcium carbonate (OS-CAL) 600 MG TABS tablet Take 600 mg by mouth daily.    [provider]  Cholecalciferol (VITAMIN D3) 5000 units TABS Take 10,000 Units by mouth daily.     [provider]  Diclofenac Sodium (PENNSAID) 2 % SOLN Place 1 application onto the skin 2 (two) times daily. 05/17/18   Myra Rude, MD  FLUoxetine (PROZAC) 40 MG capsule Take 80 mg by mouth daily.    [provider]  fluticasone (FLONASE) 50 MCG/ACT nasal spray Place 2 sprays into the nose 2 (two) times daily. 06/07/17   [provider]  Ibuprofen-Famotidine 800-26.6 MG TABS Take 1 tablet by mouth 3 (three) times daily. 05/17/18   Myra Rude, MD  ipratropium-albuterol (DUONEB) 0.5-2.5 (3) MG/3ML SOLN Take 3 mLs by nebulization as needed.    Fleming, Herbon E, MD  Lidocaine-Menthol 4-4 % PTCH Apply 1 patch topically every 12 (twelve) hours.    Sheran Luz, MD  montelukast (SINGULAIR) 10 MG tablet Take 10 mg by mouth at bedtime.  [provider]  Multiple Vitamin (MULTI-VITAMINS) TABS Take 1 tablet by mouth daily.    [provider]  ondansetron (ZOFRAN) 8 MG tablet Take 1 tablet (8 mg total) by mouth every 8 (eight) hours as needed for nausea or vomiting. 12/05/17   Joni ReiningSmith, Ronald K, PA-C  tamsulosin (FLOMAX) 0.4 MG CAPS capsule Take 1 capsule (0.4 mg total) by mouth daily. 05/17/17   Michiel CowboyMcGowan, Shannon A, PA-C    Allergies  Allergen Reactions  . Cefdinir Hives  . Esomeprazole Anaphylaxis  . Esomeprazole Magnesium Anaphylaxis  . Gabapentin Swelling  . Hydroxychloroquine Rash and Shortness Of Breath  . Tapentadol Other (See Comments)    Other  reaction(s): Other (See Comments) Body aches and pains, and itching. Swelling.   HEADACHES Body aches and pains, and itching. Swelling. Body aches and pains, and itching. Swelling.  . Nsaids Nausea Only and Other (See Comments)    Other: acid reflux  . Sumatriptan Succinate Other (See Comments)  . Cortisone Other (See Comments)    pain pain  . Duloxetine Other (See Comments)    Hot flashed, sweating  . Prednisone Other (See Comments)    Muscle spasms  . Amoxicillin Rash  . Amoxicillin-Pot Clavulanate Nausea And Vomiting and Other (See Comments)    GI symptoms and UTI  . Reglan [Metoclopramide] Anxiety  . Sulfa Antibiotics Rash  . Sulfasalazine Rash    Family History  Adopted: Yes  Problem Relation Age of Onset  . Alcohol abuse Mother   . Depression Mother   . Drug abuse Mother   . Mental illness Mother   . Alcohol abuse Father   . Arthritis Father   . Asthma Father   . COPD Father   . Depression Father   . Diabetes Father   . Drug abuse Father   . Early death Father   . Heart disease Father   . Hyperlipidemia Father   . Hypertension Father   . Kidney disease Father   . Stroke Father   . Diabetes Brother   . Depression Brother   . Kidney cancer Neg Hx   . Bladder Cancer Neg Hx     Social History Social History   Tobacco Use  . Smoking status: Never Smoker  . Smokeless tobacco: Never Used  Substance Use Topics  . Alcohol use: No  . Drug use: No    Review of Systems Constitutional: Negative for fever. Cardiovascular: Negative for chest pain. Respiratory: Negative for shortness of breath. Gastrointestinal: Positive for abdominal pain.  Positive for nausea.  Negative for vomiting.  Negative for diarrhea. Genitourinary: Negative for hematuria or dysuria.  Negative for vaginal discharge.  Currently on her menstrual period. Musculoskeletal: Negative for musculoskeletal complaints Skin: Negative for skin complaints  Neurological: Negative for headache All  other ROS negative  ____________________________________________   PHYSICAL EXAM:  VITAL SIGNS: ED Triage Vitals  Enc Vitals Group     BP 05/27/18 1520 130/87     Pulse Rate 05/27/18 1520 (!) 112     Resp --      Temp 05/27/18 1521 98.3 F (36.8 C)     Temp Source 05/27/18 1521 Oral     SpO2 05/27/18 1520 99 %     Weight 05/27/18 1419 260 lb (117.9 kg)     Height 05/27/18 1419 5\' 9"  (1.753 m)     Head Circumference --      Peak Flow --      Pain Score 05/27/18 1419 8  Pain Loc --      Pain Edu? --      Excl. in GC? --    Constitutional: Alert and oriented. Well appearing and in no distress. Eyes: Normal exam ENT   Head: Normocephalic and atraumatic.   Mouth/Throat: Mucous membranes are moist. Cardiovascular: Normal rate, regular rhythm. No murmur Respiratory: Normal respiratory effort without tachypnea nor retractions. Breath sounds are clear Gastrointestinal: Soft, obese, moderate tenderness in the epigastrium and somewhat mild tenderness diffusely throughout abdomen.  No rebound guarding or distention. Musculoskeletal: Nontender with normal range of motion in all extremities. Neurologic:  Normal speech and language. No gross focal neurologic deficits Skin:  Skin is warm, dry and intact.  Psychiatric: Mood and affect are normal. Speech and behavior are normal.   ____________________________________________    RADIOLOGY  CT consistent with acute pancreatitis.  ____________________________________________   INITIAL IMPRESSION / ASSESSMENT AND PLAN / ED COURSE  Pertinent labs & imaging results that were available during my care of the patient were reviewed by me and considered in my medical decision making (see chart for details).  She presents to the emergency department for abdominal pain and nausea ongoing for 3 days.  Went to the walk-in clinic today and was sent to the emergency department.  Differential is quite broad at this time would include  pancreatitis (denies alcohol use), cholecystitis although no significant tenderness in the right upper quadrant, appendicitis, urinary tract infection, colitis or diverticulitis.  Patient's labs are resulted showing a significant leukocytosis of 28,000 and, lipase is slightly elevated which could suggest an acute pancreatitis.  LFTs are reassuringly normal including a normal total bilirubin.  We will IV hydrate, treat pain and nausea and obtain a noncontrasted CT scan as the patient states both IV and oral contrast of made her very ill in the past.  Patient may also require a right upper quadrant ultrasound pending on CT findings.  Overall patient appears well, no significant distress does appear mildly uncomfortable.  CT consistent with acute pancreatitis.  Patient still has significant abdominal pain.  Will admit for pain control and pancreas rest.  ____________________________________________   FINAL CLINICAL IMPRESSION(S) / ED DIAGNOSES  Abdominal pain Acute pancreatitis   Minna Antis, MD 05/27/18 1700

## 2018-05-27 NOTE — H&P (Signed)
[employer] - [place] at [facility]   PATIENT NAME: Kelsey Bell    MR#:  [id number]  DATE OF BIRTH:  1980/10/27  DATE OF ADMISSION:  05/27/2018  PRIMARY CARE PHYSICIAN: System, Pcp Not In   REQUESTING/REFERRING PHYSICIAN: Minna Antis, MD  CHIEF COMPLAINT:   Chief Complaint  Patient presents with  . Abdominal Pain  . Nausea    HISTORY OF PRESENT ILLNESS: Kelsey Bell  is a 37 y.o. female with a known history of depression, anxiety, recurrent renal stones who is presenting with abdominal pain started 3 days ago..  She describes the pain in the epigastric area with radiation to the back and severe.  It waxes and wanes.  In the ER patient was noted to have elevated lipase and CT scan of the abdomen shows acute pancreatitis. PAST MEDICAL HISTORY:   Past Medical History:  Diagnosis Date  . Allergy   . Anxiety   . Arthritis   . Asthma   . Breast pain present for several months   Bil LT >RT across of breasts  . CRPS (complex regional pain syndrome type I)   . Depression   . Kidney stones   . LGSIL on Pap smear of cervix 2007  . Migraine   . Neuromuscular disorder (HCC)    Complex regional pain syndrome and Fibromyalgia    PAST SURGICAL HISTORY:  Past Surgical History:  Procedure Laterality Date  . COLPOSCOPY  2017   neg bx  . EXTRACORPOREAL SHOCK WAVE LITHOTRIPSY Right 04/26/2017   Procedure: EXTRACORPOREAL SHOCK WAVE LITHOTRIPSY (ESWL);  Surgeon: Vanna Scotland, MD;  Location: [facility] ORS;  Service: Urology;  Laterality: Right;  . KNEE ARTHROSCOPY Right   . TONSILLECTOMY      SOCIAL HISTORY:  Social History   Tobacco Use  . Smoking status: Never Smoker  . Smokeless tobacco: Never Used  Substance Use Topics  . Alcohol use: No    FAMILY HISTORY:  Family History  Adopted: Yes  Problem Relation Age of Onset  . Alcohol abuse Mother   . Depression Mother   . Drug abuse Mother   . Mental illness Mother   . Alcohol abuse Father   . Arthritis  Father   . Asthma Father   . COPD Father   . Depression Father   . Diabetes Father   . Drug abuse Father   . Early death Father   . Heart disease Father   . Hyperlipidemia Father   . Hypertension Father   . Kidney disease Father   . Stroke Father   . Diabetes Brother   . Depression Brother   . Kidney cancer Neg Hx   . Bladder Cancer Neg Hx     DRUG ALLERGIES:  Allergies  Allergen Reactions  . Cefdinir Hives  . Esomeprazole Anaphylaxis  . Esomeprazole Magnesium Anaphylaxis  . Gabapentin Swelling  . Hydroxychloroquine Rash and Shortness Of Breath  . Tapentadol Other (See Comments)    Other reaction(s): Other (See Comments) Body aches and pains, and itching. Swelling.   HEADACHES Body aches and pains, and itching. Swelling. Body aches and pains, and itching. Swelling.  . Nsaids Nausea Only and Other (See Comments)    Other: acid reflux  . Sumatriptan Succinate Other (See Comments)  . Cortisone Other (See Comments)    pain pain  . Duloxetine Other (See Comments)    Hot flashed, sweating  . Prednisone Other (See Comments)    Muscle spasms  . Amoxicillin Rash    Has  patient had a PCN reaction causing immediate rash, facial/tongue/throat swelling, SOB or lightheadedness with hypotension: Unknown Has patient had a PCN reaction causing severe rash involving mucus membranes or skin necrosis: Unknown Has patient had a PCN reaction that required hospitalization: Unknown Has patient had a PCN reaction occurring within the last 10 years: Unknown If all of the above answers are "NO", then may proceed with Cephalosporin use.   Marland Kitchen. Amoxicillin-Pot Clavulanate Nausea And Vomiting and Other (See Comments)    GI symptoms and UTI  . Reglan [Metoclopramide] Anxiety  . Sulfa Antibiotics Rash  . Sulfasalazine Rash    REVIEW OF SYSTEMS:   CONSTITUTIONAL: No fever, fatigue or weakness.  EYES: No blurred or double vision.  EARS, NOSE, AND THROAT: No tinnitus or ear pain.  RESPIRATORY:  No cough, shortness of breath, wheezing or hemoptysis.  CARDIOVASCULAR: No chest pain, orthopnea, edema.  GASTROINTESTINAL: No nausea, vomiting, diarrhea or positive abdominal pain.  GENITOURINARY: No dysuria, hematuria.  ENDOCRINE: No polyuria, nocturia,  HEMATOLOGY: No anemia, easy bruising or bleeding SKIN: No rash or lesion. MUSCULOSKELETAL: No joint pain or arthritis.   NEUROLOGIC: No tingling, numbness, weakness.  PSYCHIATRY: No anxiety or depression.   MEDICATIONS AT HOME:  Prior to Admission medications   Medication Sig Start Date End Date Taking? Authorizing Provider  albuterol (PROVENTIL HFA;VENTOLIN HFA) 108 (90 Base) MCG/ACT inhaler Inhale 1 puff into the lungs as needed for wheezing or shortness of breath.   Yes Mertie MooresFleming, Herbon E, MD  ALPRAZolam Prudy Feeler(XANAX) 0.25 MG tablet Take 0.25 mg by mouth as needed for anxiety.   Yes [provider]  budesonide-formoterol (SYMBICORT) 160-4.5 MCG/ACT inhaler Inhale 2 puffs into the lungs 2 (two) times daily. Using as needed   Yes Mertie MooresFleming, Herbon E, MD  calcium carbonate (OS-CAL) 600 MG TABS tablet Take 600 mg by mouth daily.   Yes [provider]  celecoxib (CELEBREX) 200 MG capsule Take 200 mg by mouth daily as needed (knee pain).   Yes [provider]  Cholecalciferol (VITAMIN D3) 5000 units TABS Take 10,000 Units by mouth daily.    Yes [provider]  FLUoxetine (PROZAC) 40 MG capsule Take 80 mg by mouth at bedtime.    Yes [provider]  fluticasone (FLONASE) 50 MCG/ACT nasal spray Place 2 sprays into the nose daily.  06/07/17  Yes [provider]  furosemide (LASIX) 20 MG tablet Take 20 mg by mouth daily.   Yes [provider]  Lidocaine-Menthol 4-4 % PTCH Apply 1 patch topically 2 (two) times daily as needed (leg pain).    Yes Sheran Luzamos, Richard, MD  montelukast (SINGULAIR) 10 MG tablet Take 10 mg by mouth at bedtime.   Yes [provider]  Multiple Vitamin  (MULTI-VITAMINS) TABS Take 1 tablet by mouth daily.   Yes [provider]  ondansetron (ZOFRAN) 8 MG tablet Take 1 tablet (8 mg total) by mouth every 8 (eight) hours as needed for nausea or vomiting. 12/05/17  Yes Joni ReiningSmith, Ronald K, PA-C  promethazine (PHENERGAN) 12.5 MG tablet Take 12.5-25 mg by mouth every 6 (six) hours as needed for nausea or vomiting.   Yes [provider]  ranitidine (ZANTAC) 75 MG tablet Take 75 mg by mouth daily as needed for heartburn.   Yes [provider]  sodium chloride (OCEAN) 0.65 % SOLN nasal spray Place 1 spray into both nostrils at bedtime.   Yes [provider]  tamsulosin (FLOMAX) 0.4 MG CAPS capsule Take 1 capsule (0.4 mg total)  by mouth daily. 05/17/17  Yes McGowan, Carollee Herter A, PA-C  Diclofenac Sodium (PENNSAID) 2 % SOLN Place 1 application onto the skin 2 (two) times daily. Patient not taking: Reported on 05/27/2018 05/17/18   Myra Rude, MD  Ibuprofen-Famotidine 800-26.6 MG TABS Take 1 tablet by mouth 3 (three) times daily. Patient not taking: Reported on 05/27/2018 05/17/18   Myra Rude, MD  meloxicam (MOBIC) 15 MG tablet Take 15 mg by mouth daily.    [provider]      PHYSICAL EXAMINATION:   VITAL SIGNS: Blood pressure (!) 152/93, pulse (!) 111, temperature 98.3 F (36.8 C), temperature source Oral, resp. rate 13, height 5\' 9"  (1.753 m), weight 117.9 kg, last menstrual period 05/25/2018, SpO2 98 %.  GENERAL:  37 y.o.-year-old patient lying in the bed with no acute distress.  EYES: Pupils equal, round, reactive to light and accommodation. No scleral icterus. Extraocular muscles intact.  HEENT: Head atraumatic, normocephalic. Oropharynx and nasopharynx clear.  NECK:  Supple, no jugular venous distention. No thyroid enlargement, no tenderness.  LUNGS: Normal breath sounds bilaterally, no wheezing, rales,rhonchi or crepitation. No use of accessory muscles of respiration.  CARDIOVASCULAR: S1, S2 normal.  No murmurs, rubs, or gallops.  ABDOMEN: Soft, gastric tenderness, nondistended. Bowel sounds present. No organomegaly or mass.  EXTREMITIES: No pedal edema, cyanosis, or clubbing.  NEUROLOGIC: Cranial nerves II through XII are intact. Muscle strength 5/5 in all extremities. Sensation intact. Gait not checked.  PSYCHIATRIC: The patient is alert and oriented x 3.  SKIN: No obvious rash, lesion, or ulcer.   LABORATORY PANEL:   CBC Recent Labs  Lab 05/27/18 1421  WBC 28.3*  HGB 12.7  HCT 36.8  PLT 468*  MCV 89.0  MCH 30.9  MCHC 34.7  RDW 13.6   ------------------------------------------------------------------------------------------------------------------  Chemistries  Recent Labs  Lab 05/27/18 1421  NA 135  K 3.7  CL 102  CO2 22  GLUCOSE 145*  BUN 10  CREATININE 0.64  CALCIUM 9.0  AST 14*  ALT 13  ALKPHOS 77  BILITOT 0.6   ------------------------------------------------------------------------------------------------------------------ estimated creatinine clearance is 132.1 mL/min (by C-G formula based on SCr of 0.64 mg/dL). ------------------------------------------------------------------------------------------------------------------ No results for input(s): TSH, T4TOTAL, T3FREE, THYROIDAB in the last 72 hours.  Invalid input(s): FREET3   Coagulation profile No results for input(s): INR, PROTIME in the last 168 hours. ------------------------------------------------------------------------------------------------------------------- No results for input(s): DDIMER in the last 72 hours. -------------------------------------------------------------------------------------------------------------------  Cardiac Enzymes No results for input(s): CKMB, TROPONINI, MYOGLOBIN in the last 168 hours.  Invalid input(s): CK ------------------------------------------------------------------------------------------------------------------ Invalid input(s):  POCBNP  ---------------------------------------------------------------------------------------------------------------  Urinalysis    Component Value Date/Time   COLORURINE AMBER (A) 05/27/2018 1421   APPEARANCEUR TURBID (A) 05/27/2018 1421   APPEARANCEUR Clear 05/10/2017 1353   LABSPEC 1.020 05/27/2018 1421   LABSPEC 1.026 09/03/2014 2035   PHURINE 6.0 05/27/2018 1421   GLUCOSEU NEGATIVE 05/27/2018 1421   GLUCOSEU Negative 09/03/2014 2035   HGBUR LARGE (A) 05/27/2018 1421   BILIRUBINUR NEGATIVE 05/27/2018 1421   BILIRUBINUR Negative 05/10/2017 1353   BILIRUBINUR Negative 09/03/2014 2035   KETONESUR NEGATIVE 05/27/2018 1421   PROTEINUR 30 (A) 05/27/2018 1421   NITRITE NEGATIVE 05/27/2018 1421   LEUKOCYTESUR NEGATIVE 05/27/2018 1421   LEUKOCYTESUR 1+ (A) 05/10/2017 1353   LEUKOCYTESUR Negative 09/03/2014 2035     RADIOLOGY: Ct Renal Stone Study  Result Date: 05/27/2018 CLINICAL DATA:  Acute epigastric and right flank pain. EXAM: CT ABDOMEN AND PELVIS WITHOUT CONTRAST TECHNIQUE: Multidetector CT imaging of the abdomen  and pelvis was performed following the standard protocol without IV contrast. COMPARISON:  CT scan of April 16, 2018. FINDINGS: Lower chest: No acute abnormality. Hepatobiliary: No focal liver abnormality is seen. No gallstones, gallbladder wall thickening, or biliary dilatation. Pancreas: Mild inflammatory changes are seen around the pancreatic head and body concerning for pancreatitis. No ductal dilatation is noted. No pseudocyst formation is noted. Spleen: Normal in size without focal abnormality. Adrenals/Urinary Tract: Adrenal glands are unremarkable. Kidneys are normal, without renal calculi, focal lesion, or hydronephrosis. Bladder is unremarkable. Stomach/Bowel: Stomach is within normal limits. Appendix appears normal. No evidence of bowel wall thickening, distention, or inflammatory changes. Vascular/Lymphatic: No significant vascular findings are present. No  enlarged abdominal or pelvic lymph nodes. Reproductive: Uterus and bilateral adnexa are unremarkable. Other: No abdominal wall hernia or abnormality. No abdominopelvic ascites. Musculoskeletal: No acute or significant osseous findings. IMPRESSION: Inflammatory changes are seen around the pancreatic head and body concerning for acute pancreatitis. No pseudocyst formation is seen at this time. Electronically Signed   By: Lupita Raider, M.D.   On: 05/27/2018 16:28    EKG: Orders placed or performed in visit on 07/12/07  . EKG 12-Lead    IMPRESSION AND PLAN: Patient is a 37 year old presenting to the hospital with severe abdominal pain noted to have acute pancreatitis  1.  Acute pancreatitis etiology unclear patient does not drink We will keep n.p.o. IV fluids I have notified GI of the consult Check a lipid panel in the morning Check upper quadrant ultrasound Patient's medications reviewed they do not seem to be the cause for pancreatitis  2.  Leukocytosis follow CBC this is related to acute pancreatitis likely reactive no fever no antibiotics for now  3.  Depression hold medications for now  4.  Miscellaneous Lovenox DVT prophylaxis   All the records are reviewed and case discussed with ED provider. Management plans discussed with the patient, family and they are in agreement.  CODE STATUS: Code Status History    Date Active Date Inactive Code Status Order ID Comments User Context   04/25/2017 0646 04/26/2017 1941 Full Code [id number]  Ihor Austin, MD Inpatient       TOTAL TIME TAKING CARE OF THIS PATIENT:30minutes.    Auburn Bilberry M.D on 05/27/2018 at 5:33 PM  Between 7am to 6pm - Pager - [phone]  After 6pm go to [email or web address] - password [email or web address] [facility]  [employer] Office  [phone]  CC: Primary care physician; System, Pcp Not In

## 2018-05-27 NOTE — ED Notes (Signed)
Pt states she "feels constipated but also like I have gas."

## 2018-05-27 NOTE — ED Triage Notes (Signed)
Abdominal pain x 1 week. Nausea but not vomiting. No diarrhea.

## 2018-05-27 NOTE — ED Triage Notes (Signed)
First Nurse Note:  [facility] abdominal pain and possible dehydration.  Patient is AAOx3.  Skin warm and dry. NAD

## 2018-05-28 ENCOUNTER — Inpatient Hospital Stay: Payer: Self-pay

## 2018-05-28 ENCOUNTER — Other Ambulatory Visit: Payer: Self-pay

## 2018-05-28 DIAGNOSIS — K859 Acute pancreatitis without necrosis or infection, unspecified: Principal | ICD-10-CM

## 2018-05-28 LAB — BASIC METABOLIC PANEL
Anion gap: 7 (ref 5–15)
BUN: 10 mg/dL (ref 6–20)
CALCIUM: 8.3 mg/dL — AB (ref 8.9–10.3)
CO2: 22 mmol/L (ref 22–32)
Chloride: 108 mmol/L (ref 98–111)
Creatinine, Ser: 0.6 mg/dL (ref 0.44–1.00)
GFR calc Af Amer: >60 mL/min (ref >60)
GLUCOSE: 127 mg/dL — AB (ref 70–99)
Potassium: 3.5 mmol/L (ref 3.5–5.1)
Sodium: 137 mmol/L (ref 135–145)

## 2018-05-28 LAB — CBC
HCT: 32.4 % — ABNORMAL LOW (ref 35.0–47.0)
Hemoglobin: 10.8 g/dL — ABNORMAL LOW (ref 12.0–16.0)
MCH: 30.4 pg (ref 26.0–34.0)
MCHC: 33.2 g/dL (ref 32.0–36.0)
MCV: 91.5 fL (ref 80.0–100.0)
PLATELETS: 379 10*3/uL (ref 150–440)
RBC: 3.54 MIL/uL — ABNORMAL LOW (ref 3.80–5.20)
RDW: 13.9 % (ref 11.5–14.5)
WBC: 23.9 10*3/uL — AB (ref 3.6–11.0)

## 2018-05-28 LAB — LIPID PANEL
Cholesterol: 149 mg/dL (ref 0–200)
HDL: 65 mg/dL (ref >40)
LDL Cholesterol: 75 mg/dL (ref 0–99)
Total CHOL/HDL Ratio: 2.3 RATIO
Triglycerides: 46 mg/dL (ref <150)
VLDL: 9 mg/dL (ref 0–40)

## 2018-05-28 LAB — LIPASE, BLOOD: LIPASE: 35 U/L (ref 11–51)

## 2018-05-28 LAB — HEPATIC FUNCTION PANEL
ALT: 12 U/L (ref 0–44)
AST: 11 U/L — AB (ref 15–41)
Albumin: 3.3 g/dL — ABNORMAL LOW (ref 3.5–5.0)
Alkaline Phosphatase: 67 U/L (ref 38–126)
Bilirubin, Direct: 0.2 mg/dL (ref 0.0–0.2)
Indirect Bilirubin: 0.6 mg/dL (ref 0.3–0.9)
TOTAL PROTEIN: 7.1 g/dL (ref 6.5–8.1)
Total Bilirubin: 0.8 mg/dL (ref 0.3–1.2)

## 2018-05-28 MED ORDER — HYDROMORPHONE HCL 1 MG/ML IJ SOLN
2.0000 mg | INTRAMUSCULAR | Status: DC | PRN
  Administered 2018-05-28 – 2018-06-06 (×54): 2 mg via INTRAVENOUS
  Filled 2018-05-28 (×55): qty 2

## 2018-05-28 MED ORDER — ENOXAPARIN SODIUM 40 MG/0.4ML ~~LOC~~ SOLN
40.0000 mg | Freq: Two times a day (BID) | SUBCUTANEOUS | Status: DC
  Filled 2018-05-28 (×4): qty 0.4

## 2018-05-28 NOTE — Plan of Care (Signed)
  Problem: Education: Goal: Knowledge of General Education information will improve Description Including pain rating scale, medication(s)/side effects and non-pharmacologic comfort measures Outcome: Progressing   Problem: Health Behavior/Discharge Planning: Goal: Ability to manage health-related needs will improve Outcome: Progressing   Problem: Clinical Measurements: Goal: Ability to maintain clinical measurements within normal limits will improve Outcome: Progressing Goal: Diagnostic test results will improve Outcome: Progressing   Problem: Pain Managment: Goal: General experience of comfort will improve Outcome: Progressing Note:  Cont to c/o abd pain.  Ivfs  pain meds given   Problem: Safety: Goal: Ability to remain free from injury will improve Outcome: Progressing   Problem: Skin Integrity: Goal: Risk for impaired skin integrity will decrease Outcome: Progressing

## 2018-05-28 NOTE — Progress Notes (Signed)
[employer] - [place] at [facility]                                                                                                                                                                                  Patient Demographics   Kelsey Bell, is a 37 y.o. female, DOB - 01/10/1981, ZOX:[id number]  Admit date - 05/27/2018   Admitting Physician Auburn BilberryShreyang Ariyona Eid, MD  Outpatient Primary MD for the patient is System, Pcp Not In   LOS - 1  Subjective: Patient admitted with abdominal pain continues to have abdominal pain states that morphine is not working    Review of Systems:   CONSTITUTIONAL: No documented fever. No fatigue, weakness. No weight gain, no weight loss.  EYES: No blurry or double vision.  ENT: No tinnitus. No postnasal drip. No redness of the oropharynx.  RESPIRATORY: No cough, no wheeze, no hemoptysis. No dyspnea.  CARDIOVASCULAR: No chest pain. No orthopnea. No palpitations. No syncope.  GASTROINTESTINAL: No nausea, no vomiting or diarrhea.  Positive abdominal pain. No melena or hematochezia.  GENITOURINARY: No dysuria or hematuria.  ENDOCRINE: No polyuria or nocturia. No heat or cold intolerance.  HEMATOLOGY: No anemia. No bruising. No bleeding.  INTEGUMENTARY: No rashes. No lesions.  MUSCULOSKELETAL: No arthritis. No swelling. No gout.  NEUROLOGIC: No numbness, tingling, or ataxia. No seizure-type activity.  PSYCHIATRIC: No anxiety. No insomnia. No ADD.    Vitals:   Vitals:   05/27/18 1945 05/27/18 2000 05/27/18 2031 05/28/18 0359  BP:   (!) 156/77 (!) 148/83  Pulse:   (!) 113 99  Resp: 19 (!) 27 19 17   Temp:   98.7 F (37.1 C) 99.8 F (37.7 C)  TempSrc:   Oral Oral  SpO2: 100% 98% 99% 94%  Weight:   (!) 137.9 kg   Height:   5\' 7"  (1.702 m)     Wt Readings from Last 3 Encounters:  05/27/18 (!) 137.9 kg  05/08/18 117.9 kg  03/04/18 135.4 kg     Intake/Output Summary (Last 24 hours) at 05/28/2018 1033 Last data filed at 05/28/2018  0300 Gross per 24 hour  Intake 668.26 ml  Output -  Net 668.26 ml    Physical Exam:   GENERAL: Pleasant-appearing in no apparent distress.  HEAD, EYES, EARS, NOSE AND THROAT: Atraumatic, normocephalic. Extraocular muscles are intact. Pupils equal and reactive to light. Sclerae anicteric. No conjunctival injection. No oro-pharyngeal erythema.  NECK: Supple. There is no jugular venous distention. No bruits, no lymphadenopathy, no thyromegaly.  HEART: Regular rate and rhythm,. No murmurs, no rubs, no clicks.  LUNGS: Clear to auscultation bilaterally. No rales or rhonchi. No wheezes.  ABDOMEN: Soft, flat,  positive tenderness, nondistended. Has good bowel sounds. No hepatosplenomegaly appreciated.  EXTREMITIES: No evidence of any cyanosis, clubbing, or peripheral edema.  +2 pedal and radial pulses bilaterally.  NEUROLOGIC: The patient is alert, awake, and oriented x3 with no focal motor or sensory deficits appreciated bilaterally.  SKIN: Moist and warm with no rashes appreciated.  Psych: Not anxious, depressed LN: No inguinal LN enlargement    Antibiotics   Anti-infectives (From admission, onward)   None      Medications   Scheduled Meds: . enoxaparin (LOVENOX) injection  40 mg Subcutaneous Q24H  . FLUoxetine  80 mg Oral QHS  . montelukast  10 mg Oral QHS  . sodium chloride  1 spray Each Nare QHS   Continuous Infusions: . sodium chloride 125 mL/hr at 05/28/18 0544   PRN Meds:.acetaminophen **OR** acetaminophen, fluticasone, HYDROcodone-acetaminophen, HYDROmorphone (DILAUDID) injection, ondansetron **OR** ondansetron (ZOFRAN) IV   Data Review:   Micro Results No results found for this or any previous visit (from the past 240 hour(s)).  Radiology Reports Dg Knee Complete 4 Views Right  Result Date: 05/08/2018 CLINICAL DATA:  Knee pain. Heard a pop in the right knee this morning. Unable to stand. Initial encounter. EXAM: RIGHT KNEE - COMPLETE 4+ VIEW COMPARISON:  02/27/2017  FINDINGS: There is no evidence of fracture, dislocation, or knee joint effusion. Mild medial compartment joint space narrowing is noted. The soft tissues are unremarkable. IMPRESSION: No acute osseous abnormality. Electronically Signed   By: Sebastian Ache M.D.   On: 05/08/2018 11:12   Ct Renal Stone Study  Result Date: 05/27/2018 CLINICAL DATA:  Acute epigastric and right flank pain. EXAM: CT ABDOMEN AND PELVIS WITHOUT CONTRAST TECHNIQUE: Multidetector CT imaging of the abdomen and pelvis was performed following the standard protocol without IV contrast. COMPARISON:  CT scan of April 16, 2018. FINDINGS: Lower chest: No acute abnormality. Hepatobiliary: No focal liver abnormality is seen. No gallstones, gallbladder wall thickening, or biliary dilatation. Pancreas: Mild inflammatory changes are seen around the pancreatic head and body concerning for pancreatitis. No ductal dilatation is noted. No pseudocyst formation is noted. Spleen: Normal in size without focal abnormality. Adrenals/Urinary Tract: Adrenal glands are unremarkable. Kidneys are normal, without renal calculi, focal lesion, or hydronephrosis. Bladder is unremarkable. Stomach/Bowel: Stomach is within normal limits. Appendix appears normal. No evidence of bowel wall thickening, distention, or inflammatory changes. Vascular/Lymphatic: No significant vascular findings are present. No enlarged abdominal or pelvic lymph nodes. Reproductive: Uterus and bilateral adnexa are unremarkable. Other: No abdominal wall hernia or abnormality. No abdominopelvic ascites. Musculoskeletal: No acute or significant osseous findings. IMPRESSION: Inflammatory changes are seen around the pancreatic head and body concerning for acute pancreatitis. No pseudocyst formation is seen at this time. Electronically Signed   By: Lupita Raider, M.D.   On: 05/27/2018 16:28     CBC Recent Labs  Lab 05/27/18 1421 05/28/18 0408  WBC 28.3* 23.9*  HGB 12.7 10.8*  HCT 36.8 32.4*   PLT 468* 379  MCV 89.0 91.5  MCH 30.9 30.4  MCHC 34.7 33.2  RDW 13.6 13.9    Chemistries  Recent Labs  Lab 05/27/18 1421 05/28/18 0408  NA 135 137  K 3.7 3.5  CL 102 108  CO2 22 22  GLUCOSE 145* 127*  BUN 10 10  CREATININE 0.64 0.60  CALCIUM 9.0 8.3*  AST 14*  --   ALT 13  --   ALKPHOS 77  --   BILITOT 0.6  --    ------------------------------------------------------------------------------------------------------------------ estimated creatinine  clearance is 140 mL/min (by C-G formula based on SCr of 0.6 mg/dL). ------------------------------------------------------------------------------------------------------------------ No results for input(s): HGBA1C in the last 72 hours. ------------------------------------------------------------------------------------------------------------------ Recent Labs    05/28/18 0408  CHOL 149  HDL 65  LDLCALC 75  TRIG 46  CHOLHDL 2.3   ------------------------------------------------------------------------------------------------------------------ No results for input(s): TSH, T4TOTAL, T3FREE, THYROIDAB in the last 72 hours.  Invalid input(s): FREET3 ------------------------------------------------------------------------------------------------------------------ No results for input(s): VITAMINB12, FOLATE, FERRITIN, TIBC, IRON, RETICCTPCT in the last 72 hours.  Coagulation profile No results for input(s): INR, PROTIME in the last 168 hours.  No results for input(s): DDIMER in the last 72 hours.  Cardiac Enzymes No results for input(s): CKMB, TROPONINI, MYOGLOBIN in the last 168 hours.  Invalid input(s): CK ------------------------------------------------------------------------------------------------------------------ Invalid input(s): POCBNP    Assessment & Plan   Patient is a 37 year old presenting to the hospital with severe abdominal pain noted to have acute pancreatitis  1.  Acute pancreatitis etiology  unclear patient does not drink Continue n.p.o. status Consult pending Triglyceride levels normal Doppler ultrasound results pending Patient's on Lasix as needed Pepcid which she does not use daily he uses it as needed can cause pancreatitis however she is not taking this routinely so unlikely the cause  2.  Leukocytosis follow CBC this is related to acute pancreatitis trending down  3.  Depression hold medications for now  4.  Miscellaneous Lovenox DVT prophylaxis      Code Status Orders  (From admission, onward)         Start     Ordered   05/27/18 2030  Full code  Continuous     05/27/18 2029        Code Status History    Date Active Date Inactive Code Status Order ID Comments User Context   04/25/2017 0646 04/26/2017 1941 Full Code [id number]  Ihor Austin, MD Inpatient           Consults gastroenterology   DVT Prophylaxis  Lovenox   Lab Results  Component Value Date   PLT 379 05/28/2018     Time Spent in minutes  55 min Greater than 50% of time spent in care coordination and counseling patient regarding the condition and plan of care.   Auburn Bilberry M.D on 05/28/2018 at 10:33 AM  Between 7am to 6pm - Pager - [phone]  After 6pm go to [email or web address] - Social research officer, government  [employer]   Office  [phone]

## 2018-05-28 NOTE — Progress Notes (Signed)
Pharmacist - Prescriber Communication   Enoxaparin dose modified from 40 mg subcutaneously once daily to 40 mg subcutaneously twice daily d/t BMI > 40.  Shakeema Lippman A. Basaltookson, Vermont[place]arm.D., BCPS Clinical Pharmacist 05/28/18 13:05

## 2018-05-28 NOTE — H&P (Signed)
Kelsey Bell , MD [address], [place], [place], [address] [address], [place], [place], [address] Phone: [phone]  Fax: [phone]  Consultation  Referring Provider:  Dr Allena Katz  Primary Care Physician:  System, Pcp Not In Primary Gastroenterologist:None        Reason for Consultation:     Pancreatitis   Date of Admission:  05/27/2018 Date of Consultation:  05/28/2018         HPI:   Kelsey Bell is a 37 y.o. female resented to the hospital yesterday with abdominal pain of 3 days duration.  In the emergency room she was diagnosed with acute pancreatitis and admitted.  CT scan of the abdomen demonstrated inflammatory changes around the pancreatic head and body concerning for acute pancreatitis.  No gallstones seen.  No biliary dilation noted.  On admission white cell count is 28,000 and dropped to 24,000 this morning.  Hemoglobin 10.8 g this morning.  MCV 91.5.  Transaminases, alkaline phosphatase and total bilirubin were normal on admission.  Lipase on admission was 79.  Glyceride levels were in normal range.  She states that she has never been diagnosed with pancreatitis in the past before.  She says that the pain began on Friday got worse which brought her to hospital.  She has not started any new medications denies any herbal supplements she does take a few products of herbal life and we went through each product and it basically was cellulose fiber.  She denies any alcohol use no family history of gallbladder issues.  Denies consuming any mushrooms.  Denies using any hormonal contraceptive pills.  No family history of chronic pancreatitis or pancreatic cancer.  Presently still has epigastric pain but getting better.  Not taking much orally yet at this point.   Past Medical History:  Diagnosis Date  . Allergy   . Anxiety   . Arthritis   . Asthma   . Breast pain present for several months   Bil LT >RT across of breasts  . CRPS (complex regional pain  syndrome type I)   . Depression   . Kidney stones   . LGSIL on Pap smear of cervix 2007  . Migraine   . Neuromuscular disorder (HCC)    Complex regional pain syndrome and Fibromyalgia    Past Surgical History:  Procedure Laterality Date  . COLPOSCOPY  2017   neg bx  . EXTRACORPOREAL SHOCK WAVE LITHOTRIPSY Right 04/26/2017   Procedure: EXTRACORPOREAL SHOCK WAVE LITHOTRIPSY (ESWL);  Surgeon: Vanna Scotland, MD;  Location: [facility] ORS;  Service: Urology;  Laterality: Right;  . KNEE ARTHROSCOPY Right   . TONSILLECTOMY      Prior to Admission medications   Medication Sig Start Date End Date Taking? Authorizing Provider  albuterol (PROVENTIL HFA;VENTOLIN HFA) 108 (90 Base) MCG/ACT inhaler Inhale 1 puff into the lungs as needed for wheezing or shortness of breath.   Yes Mertie Moores, MD  ALPRAZolam Prudy Feeler) 0.25 MG tablet Take 0.25 mg by mouth as needed for anxiety.   Yes [provider]  budesonide-formoterol (SYMBICORT) 160-4.5 MCG/ACT inhaler Inhale 2 puffs into the lungs 2 (two) times daily. Using as needed   Yes Mertie Moores, MD  calcium carbonate (OS-CAL) 600 MG TABS tablet Take 600 mg by mouth daily.   Yes [provider]  celecoxib (CELEBREX) 200 MG capsule Take 200 mg by mouth daily as needed (knee pain).   Yes [provider]  Cholecalciferol (VITAMIN D3) 5000 units  TABS Take 10,000 Units by mouth daily.    Yes [provider]  FLUoxetine (PROZAC) 40 MG capsule Take 80 mg by mouth at bedtime.    Yes [provider]  fluticasone (FLONASE) 50 MCG/ACT nasal spray Place 2 sprays into the nose daily.  06/07/17  Yes [provider]  furosemide (LASIX) 20 MG tablet Take 20 mg by mouth daily.   Yes [provider]  Lidocaine-Menthol 4-4 % PTCH Apply 1 patch topically 2 (two) times daily as needed (leg pain).    Yes Sheran Luz, MD  montelukast (SINGULAIR) 10 MG tablet Take 10 mg by mouth at bedtime.   Yes [provider]  Multiple Vitamin (MULTI-VITAMINS) TABS Take 1 tablet by mouth daily.   Yes [provider]  ondansetron (ZOFRAN) 8 MG tablet Take 1 tablet (8 mg total) by mouth every 8 (eight) hours as needed for nausea or vomiting. 12/05/17  Yes Joni Reining, PA-C  promethazine (PHENERGAN) 12.5 MG tablet Take 12.5-25 mg by mouth every 6 (six) hours as needed for nausea or vomiting.   Yes [provider]  ranitidine (ZANTAC) 75 MG tablet Take 75 mg by mouth daily as needed for heartburn.   Yes [provider]  sodium chloride (OCEAN) 0.65 % SOLN nasal spray Place 1 spray into both nostrils at bedtime.   Yes [provider]  tamsulosin (FLOMAX) 0.4 MG CAPS capsule Take 1 capsule (0.4 mg total) by mouth daily. 05/17/17  Yes McGowan, Carollee Herter A, PA-C  Diclofenac Sodium (PENNSAID) 2 % SOLN Place 1 application onto the skin 2 (two) times daily. Patient not taking: Reported on 05/27/2018 05/17/18   Myra Rude, MD  Ibuprofen-Famotidine 800-26.6 MG TABS Take 1 tablet by mouth 3 (three) times daily. Patient not taking: Reported on 05/27/2018 05/17/18   Myra Rude, MD  meloxicam (MOBIC) 15 MG tablet Take 15 mg by mouth daily.    [provider]    Family History  Adopted: Yes  Problem Relation Age of Onset  . Alcohol abuse Mother   . Depression Mother   . Drug abuse Mother   . Mental illness Mother   . Alcohol abuse Father   . Arthritis Father   . Asthma Father   . COPD Father   . Depression Father   . Diabetes Father   . Drug abuse Father   . Early death Father   . Heart disease Father   . Hyperlipidemia Father   . Hypertension Father   . Kidney disease Father   . Stroke Father   . Diabetes Brother   . Depression Brother   . Kidney cancer Neg Hx   . Bladder Cancer Neg Hx      Social History   Tobacco Use  . Smoking status: Never Smoker  . Smokeless tobacco: Never Used  Substance Use Topics  . Alcohol use: No  . Drug use: No      Allergies as of 05/27/2018 - Review Complete 05/27/2018  Allergen Reaction Noted  . Cefdinir Hives 12/01/2011  . Esomeprazole Anaphylaxis 08/25/2015  . Esomeprazole magnesium Anaphylaxis   . Gabapentin Swelling 06/04/2017  . Hydroxychloroquine Rash and Shortness Of Breath 08/25/2015  . Tapentadol Other (See Comments) 05/04/2016  . Nsaids Nausea Only and Other (See Comments)   . Sumatriptan succinate Other (See Comments) 11/21/2016  . Cortisone Other (See Comments) 04/24/2015  . Duloxetine Other (See Comments) 06/04/2017  . Prednisone Other (See Comments) 08/25/2015  . Amoxicillin Rash 08/25/2015  .  Amoxicillin-pot clavulanate Nausea And Vomiting and Other (See Comments) 05/20/2015  . Reglan [metoclopramide] Anxiety 06/04/2017  . Sulfa antibiotics Rash 08/25/2015  . Sulfasalazine Rash 08/25/2015    Review of Systems:    All systems reviewed and negative except where noted in HPI.   Physical Exam:  Vital signs in last 24 hours: Temp:  [98.1 F (36.7 C)-99.8 F (37.7 C)] 99.8 F (37.7 C) (09/24 0359) Pulse Rate:  [99-114] 99 (09/24 0359) Resp:  [11-31] 17 (09/24 0359) BP: (127-171)/(77-156) 148/83 (09/24 0359) SpO2:  [94 %-100 %] 94 % (09/24 0359) Weight:  [117.9 kg-137.9 kg] 137.9 kg (09/23 2031)   General:   Pleasant, cooperative in NAD Head:  Normocephalic and atraumatic. Eyes:   No icterus.   Conjunctiva pink. PERRLA. Ears:  Normal auditory acuity. Neck:  Supple; no masses or thyroidomegaly Lungs: Respirations even and unlabored. Lungs clear to auscultation bilaterally.   No wheezes, crackles, or rhonchi.  Heart:  Regular rate and rhythm;  Without murmur, clicks, rubs or gallops Abdomen:  Soft, nondistended, mild epigastric tenderness.  Normal bowel sounds. No appreciable masses or hepatomegaly.  No rebound or guarding.  Neurologic:  Alert and oriented x3;  grossly normal neurologically. Skin:  Intact without significant lesions or rashes. Cervical Nodes:  No  significant cervical adenopathy. Psych:  Alert and cooperative. Normal affect.  LAB RESULTS: Recent Labs    05/27/18 1421 05/28/18 0408  WBC 28.3* 23.9*  HGB 12.7 10.8*  HCT 36.8 32.4*  PLT 468* 379   BMET Recent Labs    05/27/18 1421 05/28/18 0408  NA 135 137  K 3.7 3.5  CL 102 108  CO2 22 22  GLUCOSE 145* 127*  BUN 10 10  CREATININE 0.64 0.60  CALCIUM 9.0 8.3*   LFT Recent Labs    05/27/18 1421  PROT 8.1  ALBUMIN 4.2  AST 14*  ALT 13  ALKPHOS 77  BILITOT 0.6   PT/INR No results for input(s): LABPROT, INR in the last 72 hours.  STUDIES: Ct Renal Stone Study  Result Date: 05/27/2018 CLINICAL DATA:  Acute epigastric and right flank pain. EXAM: CT ABDOMEN AND PELVIS WITHOUT CONTRAST TECHNIQUE: Multidetector CT imaging of the abdomen and pelvis was performed following the standard protocol without IV contrast. COMPARISON:  CT scan of April 16, 2018. FINDINGS: Lower chest: No acute abnormality. Hepatobiliary: No focal liver abnormality is seen. No gallstones, gallbladder wall thickening, or biliary dilatation. Pancreas: Mild inflammatory changes are seen around the pancreatic head and body concerning for pancreatitis. No ductal dilatation is noted. No pseudocyst formation is noted. Spleen: Normal in size without focal abnormality. Adrenals/Urinary Tract: Adrenal glands are unremarkable. Kidneys are normal, without renal calculi, focal lesion, or hydronephrosis. Bladder is unremarkable. Stomach/Bowel: Stomach is within normal limits. Appendix appears normal. No evidence of bowel wall thickening, distention, or inflammatory changes. Vascular/Lymphatic: No significant vascular findings are present. No enlarged abdominal or pelvic lymph nodes. Reproductive: Uterus and bilateral adnexa are unremarkable. Other: No abdominal wall hernia or abnormality. No abdominopelvic ascites. Musculoskeletal: No acute or significant osseous findings. IMPRESSION: Inflammatory changes are seen  around the pancreatic head and body concerning for acute pancreatitis. No pseudocyst formation is seen at this time. Electronically Signed   By: Lupita RaiderJames  Green Jr, M.D.   On: 05/27/2018 16:28      Impression / Plan:   Kelsey Bell is a 37 y.o. y/o female with acute pancreatitis.  Surprisingly her lipase level is within the normal range does not qualify for  acute pancreatitis but she does have radiological evidence of inflammation around the pancreas.  Transaminases, total bilirubin, alkaline phosphatase are normal making this less likely secondary to gallstones.  I did explain that about 30% of pancreatitis the cause is idiopathic or unknown.  Plan 1.  Right upper quadrant ultrasound 2.  Continue IV fluids advance diet gradually as tolerated by the patient and as requested. 3.  Check IgG4 levels. 4.  Recheck liver function tests occasionally may be lagging behind clinical presentation and if there is a jump in LFTs it would indicate passage of large or microlithiasis which would be an indication for cholecystectomy. 5.  If all tests are negative suggest MRI of the pancreas in 6 to 8 weeks once inflammation has resolved.  Thank you for involving me in the care of this patient.      LOS: 1 day   Kelsey Mood, MD  05/28/2018, 8:33 AM

## 2018-05-29 DIAGNOSIS — K8500 Idiopathic acute pancreatitis without necrosis or infection: Secondary | ICD-10-CM

## 2018-05-29 LAB — BASIC METABOLIC PANEL
Anion gap: 8 (ref 5–15)
BUN: 8 mg/dL (ref 6–20)
CO2: 21 mmol/L — ABNORMAL LOW (ref 22–32)
CREATININE: 0.47 mg/dL (ref 0.44–1.00)
Calcium: 8.3 mg/dL — ABNORMAL LOW (ref 8.9–10.3)
Chloride: 107 mmol/L (ref 98–111)
GFR calc Af Amer: >60 mL/min (ref >60)
Glucose, Bld: 97 mg/dL (ref 70–99)
Potassium: 3.8 mmol/L (ref 3.5–5.1)
SODIUM: 136 mmol/L (ref 135–145)

## 2018-05-29 LAB — IGG 4: IGG 4: 28 mg/dL (ref 2–96)

## 2018-05-29 LAB — CBC
HCT: 30.9 % — ABNORMAL LOW (ref 35.0–47.0)
Hemoglobin: 10.5 g/dL — ABNORMAL LOW (ref 12.0–16.0)
MCH: 31.1 pg (ref 26.0–34.0)
MCHC: 33.9 g/dL (ref 32.0–36.0)
MCV: 91.7 fL (ref 80.0–100.0)
PLATELETS: 365 10*3/uL (ref 150–440)
RBC: 3.37 MIL/uL — AB (ref 3.80–5.20)
RDW: 14 % (ref 11.5–14.5)
WBC: 23.1 10*3/uL — ABNORMAL HIGH (ref 3.6–11.0)

## 2018-05-29 LAB — HIV ANTIBODY (ROUTINE TESTING W REFLEX): HIV SCREEN 4TH GENERATION: NONREACTIVE

## 2018-05-29 MED ORDER — CIPROFLOXACIN IN D5W 400 MG/200ML IV SOLN
400.0000 mg | Freq: Two times a day (BID) | INTRAVENOUS | Status: DC
  Administered 2018-05-29 – 2018-05-30 (×3): 400 mg via INTRAVENOUS
  Filled 2018-05-29 (×5): qty 200

## 2018-05-29 MED ORDER — DEXTROSE IN LACTATED RINGERS 5 % IV SOLN
INTRAVENOUS | Status: DC
  Administered 2018-05-29 – 2018-05-30 (×3): via INTRAVENOUS

## 2018-05-29 MED ORDER — POLYETHYLENE GLYCOL 3350 17 G PO PACK
17.0000 g | PACK | Freq: Every day | ORAL | Status: DC
  Administered 2018-05-31: 10:00:00 17 g via ORAL
  Filled 2018-05-29 (×3): qty 1

## 2018-05-29 MED ORDER — DEXTROSE-NACL 5-0.9 % IV SOLN: INTRAVENOUS | Status: DC

## 2018-05-29 MED ORDER — DOCUSATE SODIUM 100 MG PO CAPS
200.0000 mg | ORAL_CAPSULE | Freq: Two times a day (BID) | ORAL | Status: DC
  Administered 2018-05-29 – 2018-05-31 (×5): 200 mg via ORAL
  Filled 2018-05-29 (×9): qty 2

## 2018-05-29 MED ORDER — CALCIUM CARBONATE ANTACID 500 MG PO CHEW
1.0000 | CHEWABLE_TABLET | Freq: Three times a day (TID) | ORAL | Status: DC | PRN
  Administered 2018-05-29 – 2018-06-04 (×10): 200 mg via ORAL
  Filled 2018-05-29 (×11): qty 1

## 2018-05-29 MED ORDER — IBUPROFEN 400 MG PO TABS
400.0000 mg | ORAL_TABLET | Freq: Four times a day (QID) | ORAL | Status: DC | PRN
  Administered 2018-05-29 – 2018-05-31 (×2): 400 mg via ORAL
  Filled 2018-05-29 (×2): qty 1

## 2018-05-29 MED ORDER — METRONIDAZOLE IN NACL 5-0.79 MG/ML-% IV SOLN
500.0000 mg | Freq: Three times a day (TID) | INTRAVENOUS | Status: DC
  Administered 2018-05-29 – 2018-05-30 (×4): 500 mg via INTRAVENOUS
  Filled 2018-05-29 (×7): qty 100

## 2018-05-29 NOTE — Progress Notes (Signed)
Kelsey Bell , MD [address] [place], [place], [place], [address] [address], [place], [place], [place], [address] Phone: [phone]  Fax: [phone]   Kelsey Bell is being followed for acute pancreatitis  Day 1 of follow up   Subjective: Some pain able to tolerate liquids, nausea +, headaches+   Objective: Vital signs in last 24 hours: Vitals:   05/28/18 2103 05/29/18 0305 05/29/18 0756 05/29/18 0843  BP: 137/84 139/79 (!) 151/78 (!) 142/77  Pulse: (!) 108 (!) 102 (!) 103 (!) 105  Resp: 18 16    Temp: 99.4 F (37.4 C) 99.1 F (37.3 C) 98.7 F (37.1 C) 99.5 F (37.5 C)  TempSrc: Oral Oral Oral Oral  SpO2: 95% 98% 98% 99%  Weight:      Height:       Weight change:   Intake/Output Summary (Last 24 hours) at 05/29/2018 1201 Last data filed at 05/29/2018 1022 Gross per 24 hour  Intake 4509.53 ml  Output -  Net 4509.53 ml     Exam: Heart:: Regular rate and rhythm, S1S2 present or without murmur or extra heart sounds Lungs: normal, clear to auscultation and clear to auscultation and percussion Abdomen: soft, mild epigastric tenderness, no guarding or rigidity , normal bowel sounds   Lab Results: '@LABTEST2' @ Micro Results: No results found for this or any previous visit (from the past 240 hour(s)). Studies/Results: US Abdomen Complete  Result Date: 05/28/2018 CLINICAL DATA:  Patient with abdominal pain for 5 days EXAM: ABDOMEN ULTRASOUND COMPLETE COMPARISON:  None. FINDINGS: Gallbladder: No gallstones or wall thickening visualized. No sonographic Murphy sign noted by sonographer. Common bile duct: Diameter: 4 mm Liver: No focal lesion identified. Within normal limits in parenchymal echogenicity. Portal vein is patent on color Doppler imaging with normal direction of blood flow towards the liver. IVC: No abnormality visualized. Pancreas: Visualized portion unremarkable. Spleen: Size and appearance within normal limits. Right Kidney: Length: 11.3 cm.  Echogenicity within normal limits. No mass or hydronephrosis visualized. Left Kidney: Length: 11.9 cm. Echogenicity within normal limits. No mass or hydronephrosis visualized. Small echogenic structure left kidney favored to represent renal sinus fat. Abdominal aorta: No aneurysm visualized. Other findings: None. IMPRESSION: No hydronephrosis.  No acute process. Electronically Signed   By: Lovey Newcomer M.D.   On: 05/28/2018 11:03   Ct Renal Stone Study  Result Date: 05/27/2018 CLINICAL DATA:  Acute epigastric and right flank pain. EXAM: CT ABDOMEN AND PELVIS WITHOUT CONTRAST TECHNIQUE: Multidetector CT imaging of the abdomen and pelvis was performed following the standard protocol without IV contrast. COMPARISON:  CT scan of April 16, 2018. FINDINGS: Lower chest: No acute abnormality. Hepatobiliary: No focal liver abnormality is seen. No gallstones, gallbladder wall thickening, or biliary dilatation. Pancreas: Mild inflammatory changes are seen around the pancreatic head and body concerning for pancreatitis. No ductal dilatation is noted. No pseudocyst formation is noted. Spleen: Normal in size without focal abnormality. Adrenals/Urinary Tract: Adrenal glands are unremarkable. Kidneys are normal, without renal calculi, focal lesion, or hydronephrosis. Bladder is unremarkable. Stomach/Bowel: Stomach is within normal limits. Appendix appears normal. No evidence of bowel wall thickening, distention, or inflammatory changes. Vascular/Lymphatic: No significant vascular findings are present. No enlarged abdominal or pelvic lymph nodes. Reproductive: Uterus and bilateral adnexa are unremarkable. Other: No abdominal wall hernia or abnormality. No abdominopelvic ascites. Musculoskeletal: No acute or significant osseous findings. IMPRESSION: Inflammatory changes are seen around the pancreatic head and body concerning for acute pancreatitis. No pseudocyst formation is seen  at this time. Electronically Signed   By: Marijo Conception, M.D.   On: 05/27/2018 16:28   Medications: I have reviewed the patient's current medications. Scheduled Meds: . docusate sodium  200 mg Oral BID  . enoxaparin (LOVENOX) injection  40 mg Subcutaneous Q12H  . FLUoxetine  80 mg Oral QHS  . montelukast  10 mg Oral QHS  . polyethylene glycol  17 g Oral Daily  . sodium chloride  1 spray Each Nare QHS   Continuous Infusions: . ciprofloxacin 400 mg (05/29/18 1022)  . dextrose 5% lactated ringers Stopped (05/29/18 1022)  . metronidazole     PRN Meds:.acetaminophen **OR** acetaminophen, fluticasone, HYDROcodone-acetaminophen, HYDROmorphone (DILAUDID) injection, ondansetron **OR** ondansetron (ZOFRAN) IV   Assessment: Active Problems:   Acute pancreatitis  CBC Latest Ref Rng & Units 05/29/2018 05/28/2018 05/27/2018  WBC 3.6 - 11.0 K/uL 23.1(H) 23.9(H) 28.3(H)  Hemoglobin 12.0 - 16.0 g/dL 10.5(L) 10.8(L) 12.7  Hematocrit 35.0 - 47.0 % 30.9(L) 32.4(L) 36.8  Platelets 150 - 440 K/uL 365 379 468(H)   Hepatic Function Latest Ref Rng & Units 05/28/2018 05/27/2018 08/25/2015  Total Protein 6.5 - 8.1 g/dL 7.1 8.1 7.6  Albumin 3.5 - 5.0 g/dL 3.3(L) 4.2 4.1  AST 15 - 41 U/L 11(L) 14(L) 15  ALT 0 - 44 U/L '12 13 17  ' Alk Phosphatase 38 - 126 U/L 67 77 76  Total Bilirubin 0.3 - 1.2 mg/dL 0.8 0.6 0.3  Bilirubin, Direct 0.0 - 0.2 mg/dL 0.2 - -     Kelsey Bell is a 37 y.o. y/o female with acute pancreatitis.  Surprisingly her lipase level is within the normal range does not qualify for acute pancreatitis but she does have radiological evidence of inflammation around the pancreas.  Transaminases, total bilirubin, alkaline phosphatase are normal making this less likely secondary to gallstones.  I did explain that about 30% of pancreatitis the cause is idiopathic or unknown.RUQ USG ,IGG4 normal   Plan 1.  Continue IV fluids advance diet gradually as tolerated by the patient and as requested. 3.  Suggest MRI of the pancreas in 6 to 8 weeks once  inflammation has resolved.   LOS: 2 days   Kelsey Bellows, MD 05/29/2018, 12:01 PM

## 2018-05-29 NOTE — Progress Notes (Signed)
[facility] - [place] [facility]                                                                                                                                                                                  Patient Demographics   Kelsey Bell, is a 37 y.o. female, DOB - March 18, 1981, ZOX:[id number]  Admit date - 05/27/2018   Admitting Physician Auburn Bilberry, MD  Outpatient Primary MD for the patient is System, Pcp Not In   LOS - 2  Subjective: Patient continues to have abdominal pain and nauseous    Review of Systems:   CONSTITUTIONAL: No documented fever. No fatigue, weakness. No weight gain, no weight loss.  EYES: No blurry or double vision.  ENT: No tinnitus. No postnasal drip. No redness of the oropharynx.  RESPIRATORY: No cough, no wheeze, no hemoptysis. No dyspnea.  CARDIOVASCULAR: No chest pain. No orthopnea. No palpitations. No syncope.  GASTROINTESTINAL: No nausea, no vomiting or diarrhea.  Positive abdominal pain. No melena or hematochezia.  GENITOURINARY: No dysuria or hematuria.  ENDOCRINE: No polyuria or nocturia. No heat or cold intolerance.  HEMATOLOGY: No anemia. No bruising. No bleeding.  INTEGUMENTARY: No rashes. No lesions.  MUSCULOSKELETAL: No arthritis. No swelling. No gout.  NEUROLOGIC: No numbness, tingling, or ataxia. No seizure-type activity.  PSYCHIATRIC: No anxiety. No insomnia. No ADD.    Vitals:   Vitals:   05/29/18 0756 05/29/18 0843 05/29/18 1212 05/29/18 1303  BP: (!) 151/78 (!) 142/77  (!) 177/89  Pulse: (!) 103 (!) 105  95  Resp:    18  Temp: 98.7 F (37.1 C) 99.5 F (37.5 C) 98.2 F (36.8 C) 98.4 F (36.9 C)  TempSrc: Oral Oral Oral Oral  SpO2: 98% 99%  98%  Weight:      Height:        Wt Readings from Last 3 Encounters:  05/27/18 (!) 137.9 kg  05/08/18 117.9 kg  03/04/18 135.4 kg     Intake/Output Summary (Last 24 hours) at 05/29/2018 1332 Last data filed at 05/29/2018 1022 Gross per 24 hour  Intake  4509.53 ml  Output -  Net 4509.53 ml    Physical Exam:   GENERAL: Pleasant-appearing in no apparent distress.  HEAD, EYES, EARS, NOSE AND THROAT: Atraumatic, normocephalic. Extraocular muscles are intact. Pupils equal and reactive to light. Sclerae anicteric. No conjunctival injection. No oro-pharyngeal erythema.  NECK: Supple. There is no jugular venous distention. No bruits, no lymphadenopathy, no thyromegaly.  HEART: Regular rate and rhythm,. No murmurs, no rubs, no clicks.  LUNGS: Clear to auscultation bilaterally. No rales or rhonchi. No wheezes.  ABDOMEN: Soft, flat, positive tenderness nondistended. Has good bowel  sounds. No hepatosplenomegaly appreciated.  EXTREMITIES: No evidence of any cyanosis, clubbing, or peripheral edema.  +2 pedal and radial pulses bilaterally.  NEUROLOGIC: The patient is alert, awake, and oriented x3 with no focal motor or sensory deficits appreciated bilaterally.  SKIN: Moist and warm with no rashes appreciated.  Psych: Not anxious, depressed LN: No inguinal LN enlargement    Antibiotics   Anti-infectives (From admission, onward)   Start     Dose/Rate Route Frequency Ordered Stop   05/29/18 1000  ciprofloxacin (CIPRO) IVPB 400 mg     400 mg 200 mL/hr over 60 Minutes Intravenous Every 12 hours 05/29/18 0919     05/29/18 1000  metroNIDAZOLE (FLAGYL) IVPB 500 mg     500 mg 100 mL/hr over 60 Minutes Intravenous Every 8 hours 05/29/18 0919        Medications   Scheduled Meds: . docusate sodium  200 mg Oral BID  . enoxaparin (LOVENOX) injection  40 mg Subcutaneous Q12H  . FLUoxetine  80 mg Oral QHS  . montelukast  10 mg Oral QHS  . polyethylene glycol  17 g Oral Daily  . sodium chloride  1 spray Each Nare QHS   Continuous Infusions: . ciprofloxacin 400 mg (05/29/18 1022)  . dextrose 5% lactated ringers Stopped (05/29/18 1022)  . metronidazole 500 mg (05/29/18 1211)   PRN Meds:.acetaminophen **OR** acetaminophen, fluticasone,  HYDROcodone-acetaminophen, HYDROmorphone (DILAUDID) injection, ondansetron **OR** ondansetron (ZOFRAN) IV   Data Review:   Micro Results No results found for this or any previous visit (from the past 240 hour(s)).  Radiology Reports US Abdomen Complete  Result Date: 05/28/2018 CLINICAL DATA:  Patient with abdominal pain for 5 days EXAM: ABDOMEN ULTRASOUND COMPLETE COMPARISON:  None. FINDINGS: Gallbladder: No gallstones or wall thickening visualized. No sonographic Murphy sign noted by sonographer. Common bile duct: Diameter: 4 mm Liver: No focal lesion identified. Within normal limits in parenchymal echogenicity. Portal vein is patent on color Doppler imaging with normal direction of blood flow towards the liver. IVC: No abnormality visualized. Pancreas: Visualized portion unremarkable. Spleen: Size and appearance within normal limits. Right Kidney: Length: 11.3 cm. Echogenicity within normal limits. No mass or hydronephrosis visualized. Left Kidney: Length: 11.9 cm. Echogenicity within normal limits. No mass or hydronephrosis visualized. Small echogenic structure left kidney favored to represent renal sinus fat. Abdominal aorta: No aneurysm visualized. Other findings: None. IMPRESSION: No hydronephrosis.  No acute process. Electronically Signed   By: Annia Belt M.D.   On: 05/28/2018 11:03   Dg Knee Complete 4 Views Right  Result Date: 05/08/2018 CLINICAL DATA:  Knee pain. Heard a pop in the right knee this morning. Unable to stand. Initial encounter. EXAM: RIGHT KNEE - COMPLETE 4+ VIEW COMPARISON:  02/27/2017 FINDINGS: There is no evidence of fracture, dislocation, or knee joint effusion. Mild medial compartment joint space narrowing is noted. The soft tissues are unremarkable. IMPRESSION: No acute osseous abnormality. Electronically Signed   By: Sebastian Ache M.D.   On: 05/08/2018 11:12   Ct Renal Stone Study  Result Date: 05/27/2018 CLINICAL DATA:  Acute epigastric and right flank pain. EXAM: CT  ABDOMEN AND PELVIS WITHOUT CONTRAST TECHNIQUE: Multidetector CT imaging of the abdomen and pelvis was performed following the standard protocol without IV contrast. COMPARISON:  CT scan of April 16, 2018. FINDINGS: Lower chest: No acute abnormality. Hepatobiliary: No focal liver abnormality is seen. No gallstones, gallbladder wall thickening, or biliary dilatation. Pancreas: Mild inflammatory changes are seen around the pancreatic head and body concerning for  pancreatitis. No ductal dilatation is noted. No pseudocyst formation is noted. Spleen: Normal in size without focal abnormality. Adrenals/Urinary Tract: Adrenal glands are unremarkable. Kidneys are normal, without renal calculi, focal lesion, or hydronephrosis. Bladder is unremarkable. Stomach/Bowel: Stomach is within normal limits. Appendix appears normal. No evidence of bowel wall thickening, distention, or inflammatory changes. Vascular/Lymphatic: No significant vascular findings are present. No enlarged abdominal or pelvic lymph nodes. Reproductive: Uterus and bilateral adnexa are unremarkable. Other: No abdominal wall hernia or abnormality. No abdominopelvic ascites. Musculoskeletal: No acute or significant osseous findings. IMPRESSION: Inflammatory changes are seen around the pancreatic head and body concerning for acute pancreatitis. No pseudocyst formation is seen at this time. Electronically Signed   By: Lupita Raider, M.D.   On: 05/27/2018 16:28     CBC Recent Labs  Lab 05/27/18 1421 05/28/18 0408 05/29/18 0301  WBC 28.3* 23.9* 23.1*  HGB 12.7 10.8* 10.5*  HCT 36.8 32.4* 30.9*  PLT 468* 379 365  MCV 89.0 91.5 91.7  MCH 30.9 30.4 31.1  MCHC 34.7 33.2 33.9  RDW 13.6 13.9 14.0    Chemistries  Recent Labs  Lab 05/27/18 1421 05/28/18 0408 05/29/18 0301  NA 135 137 136  K 3.7 3.5 3.8  CL 102 108 107  CO2 22 22 21*  GLUCOSE 145* 127* 97  BUN 10 10 8   CREATININE 0.64 0.60 0.47  CALCIUM 9.0 8.3* 8.3*  AST 14* 11*  --   ALT  13 12  --   ALKPHOS 77 67  --   BILITOT 0.6 0.8  --    ------------------------------------------------------------------------------------------------------------------ estimated creatinine clearance is 140 mL/min (by C-G formula based on SCr of 0.47 mg/dL). ------------------------------------------------------------------------------------------------------------------ No results for input(s): HGBA1C in the last 72 hours. ------------------------------------------------------------------------------------------------------------------ Recent Labs    05/28/18 0408  CHOL 149  HDL 65  LDLCALC 75  TRIG 46  CHOLHDL 2.3   ------------------------------------------------------------------------------------------------------------------ No results for input(s): TSH, T4TOTAL, T3FREE, THYROIDAB in the last 72 hours.  Invalid input(s): FREET3 ------------------------------------------------------------------------------------------------------------------ No results for input(s): VITAMINB12, FOLATE, FERRITIN, TIBC, IRON, RETICCTPCT in the last 72 hours.  Coagulation profile No results for input(s): INR, PROTIME in the last 168 hours.  No results for input(s): DDIMER in the last 72 hours.  Cardiac Enzymes No results for input(s): CKMB, TROPONINI, MYOGLOBIN in the last 168 hours.  Invalid input(s): CK ------------------------------------------------------------------------------------------------------------------ Invalid input(s): POCBNP    Assessment & Plan  Patient is a 37 year old presenting to the hospital with severe abdominal pain noted to have acute pancreatitis  1.Acute pancreatitis etiology unclear patient does not drink Continue n.p.o. status Appreciate GI consult Triglyceride levels normal Abdominal ultrasound negative Patient's on Lasix as needed Pepcid which she does not use daily he uses it as needed can cause pancreatitis however she is not taking this  routinely so unlikely the cause Patient may need recurrent imaging  2.Leukocytosis still elevated I will place patient on IV Cipro and Flagyl  3.Depression hold medications for now  4.Miscellaneous Lovenox DVT prophylaxis       Code Status Orders  (From admission, onward)         Start     Ordered   05/27/18 2030  Full code  Continuous     05/27/18 2029        Code Status History    Date Active Date Inactive Code Status Order ID Comments User Context   04/25/2017 0646 04/26/2017 1941 Full Code [id number]  Ihor Austin, MD Inpatient  Consults gastroenterology  DVT Prophylaxis  Lovenox  Lab Results  Component Value Date   PLT 365 05/29/2018     Time Spent in minutes   35 minutes  Greater than 50% of time spent in care coordination and counseling patient regarding the condition and plan of care.   Auburn Bilberry M.D on 05/29/2018 at 1:32 PM  Between 7am to 6pm - Pager - [phone]  After 6pm go to [email or web address] - Social research officer, government  [employer]   Office  [phone]

## 2018-05-30 DIAGNOSIS — R112 Nausea with vomiting, unspecified: Secondary | ICD-10-CM

## 2018-05-30 LAB — CBC
HEMATOCRIT: 27.9 % — AB (ref 35.0–47.0)
HEMOGLOBIN: 9.8 g/dL — AB (ref 12.0–16.0)
MCH: 31.7 pg (ref 26.0–34.0)
MCHC: 34.9 g/dL (ref 32.0–36.0)
MCV: 90.9 fL (ref 80.0–100.0)
Platelets: 329 10*3/uL (ref 150–440)
RBC: 3.07 MIL/uL — ABNORMAL LOW (ref 3.80–5.20)
RDW: 13.6 % (ref 11.5–14.5)
WBC: 15.1 10*3/uL — ABNORMAL HIGH (ref 3.6–11.0)

## 2018-05-30 LAB — BASIC METABOLIC PANEL
Anion gap: 10 (ref 5–15)
BUN: 8 mg/dL (ref 6–20)
CALCIUM: 8.6 mg/dL — AB (ref 8.9–10.3)
CHLORIDE: 103 mmol/L (ref 98–111)
CO2: 26 mmol/L (ref 22–32)
CREATININE: 0.52 mg/dL (ref 0.44–1.00)
GFR calc non Af Amer: >60 mL/min (ref >60)
Glucose, Bld: 115 mg/dL — ABNORMAL HIGH (ref 70–99)
Potassium: 3.3 mmol/L — ABNORMAL LOW (ref 3.5–5.1)
Sodium: 139 mmol/L (ref 135–145)

## 2018-05-30 LAB — URINALYSIS, COMPLETE (UACMP) WITH MICROSCOPIC
BILIRUBIN URINE: NEGATIVE
Glucose, UA: NEGATIVE mg/dL
KETONES UR: NEGATIVE mg/dL
Nitrite: NEGATIVE
PROTEIN: NEGATIVE mg/dL
Specific Gravity, Urine: 1.016 (ref 1.005–1.030)
pH: 5 (ref 5.0–8.0)

## 2018-05-30 LAB — HEPATIC FUNCTION PANEL
ALT: 18 U/L (ref 0–44)
AST: 24 U/L (ref 15–41)
Albumin: 2.8 g/dL — ABNORMAL LOW (ref 3.5–5.0)
Alkaline Phosphatase: 75 U/L (ref 38–126)
Bilirubin, Direct: 0.2 mg/dL (ref 0.0–0.2)
Indirect Bilirubin: 0.4 mg/dL (ref 0.3–0.9)
Total Bilirubin: 0.6 mg/dL (ref 0.3–1.2)
Total Protein: 6.3 g/dL — ABNORMAL LOW (ref 6.5–8.1)

## 2018-05-30 MED ORDER — SENNA 8.6 MG PO TABS: 2.0000 | ORAL_TABLET | Freq: Every day | ORAL | Status: DC | PRN

## 2018-05-30 MED ORDER — POTASSIUM CHLORIDE 2 MEQ/ML IV SOLN
INTRAVENOUS | Status: AC
  Administered 2018-05-30 – 2018-06-01 (×6): via INTRAVENOUS
  Filled 2018-05-30 (×9): qty 1000

## 2018-05-30 MED ORDER — ZOLPIDEM TARTRATE 5 MG PO TABS
5.0000 mg | ORAL_TABLET | Freq: Every evening | ORAL | Status: DC | PRN
  Administered 2018-05-30 – 2018-06-05 (×4): 5 mg via ORAL
  Filled 2018-05-30 (×6): qty 1

## 2018-05-30 NOTE — Progress Notes (Signed)
[employer] - [place] at [facility]                                                                                                                                                                                  Patient Demographics   Kelsey Bell, is a 37 y.o. female, DOB - November 15, 1980, ZOX:[id number]  Admit date - 05/27/2018   Admitting Physician Auburn Bilberry, MD  Outpatient Primary MD for the patient is System, Pcp Not In   LOS - 3  Subjective: Patient continues to have abdominal pain and nauseous    Review of Systems:   CONSTITUTIONAL: No documented fever. No fatigue, weakness. No weight gain, no weight loss.  EYES: No blurry or double vision.  ENT: No tinnitus. No postnasal drip. No redness of the oropharynx.  RESPIRATORY: No cough, no wheeze, no hemoptysis. No dyspnea.  CARDIOVASCULAR: No chest pain. No orthopnea. No palpitations. No syncope.  GASTROINTESTINAL: No nausea, no vomiting or diarrhea.  Positive abdominal pain. No melena or hematochezia.  GENITOURINARY: No dysuria or hematuria.  ENDOCRINE: No polyuria or nocturia. No heat or cold intolerance.  HEMATOLOGY: No anemia. No bruising. No bleeding.  INTEGUMENTARY: No rashes. No lesions.  MUSCULOSKELETAL: No arthritis. No swelling. No gout.  NEUROLOGIC: No numbness, tingling, or ataxia. No seizure-type activity.  PSYCHIATRIC: No anxiety. No insomnia. No ADD.    Vitals:   Vitals:   05/29/18 1212 05/29/18 1303 05/29/18 2013 05/30/18 0358  BP:  (!) 177/89 (!) 147/91 130/75  Pulse:  95 97 80  Resp:  18 19 16   Temp: 98.2 F (36.8 C) 98.4 F (36.9 C) 98.5 F (36.9 C) 97.7 F (36.5 C)  TempSrc: Oral Oral Oral Oral  SpO2:  98% 100% 100%  Weight:      Height:        Wt Readings from Last 3 Encounters:  05/27/18 (!) 137.9 kg  05/08/18 117.9 kg  03/04/18 135.4 kg     Intake/Output Summary (Last 24 hours) at 05/30/2018 1256 Last data filed at 05/30/2018 0739 Gross per 24 hour  Intake 2671.1 ml   Output -  Net 2671.1 ml    Physical Exam:   GENERAL: Pleasant-appearing in no apparent distress.  HEAD, EYES, EARS, NOSE AND THROAT: Atraumatic, normocephalic. Extraocular muscles are intact. Pupils equal and reactive to light. Sclerae anicteric. No conjunctival injection. No oro-pharyngeal erythema.  NECK: Supple. There is no jugular venous distention. No bruits, no lymphadenopathy, no thyromegaly.  HEART: Regular rate and rhythm,. No murmurs, no rubs, no clicks.  LUNGS: Clear to auscultation bilaterally. No rales or rhonchi. No wheezes.  ABDOMEN: Soft, flat, positive tenderness nondistended. Has good bowel sounds. No hepatosplenomegaly  appreciated.  EXTREMITIES: No evidence of any cyanosis, clubbing, or peripheral edema.  +2 pedal and radial pulses bilaterally.  NEUROLOGIC: The patient is alert, awake, and oriented x3 with no focal motor or sensory deficits appreciated bilaterally.  SKIN: Moist and warm with no rashes appreciated.  Psych: Not anxious, depressed LN: No inguinal LN enlargement    Antibiotics   Anti-infectives (From admission, onward)   Start     Dose/Rate Route Frequency Ordered Stop   05/29/18 1000  ciprofloxacin (CIPRO) IVPB 400 mg     400 mg 200 mL/hr over 60 Minutes Intravenous Every 12 hours 05/29/18 0919     05/29/18 1000  metroNIDAZOLE (FLAGYL) IVPB 500 mg     500 mg 100 mL/hr over 60 Minutes Intravenous Every 8 hours 05/29/18 0919        Medications   Scheduled Meds: . docusate sodium  200 mg Oral BID  . enoxaparin (LOVENOX) injection  40 mg Subcutaneous Q12H  . FLUoxetine  80 mg Oral QHS  . montelukast  10 mg Oral QHS  . polyethylene glycol  17 g Oral Daily  . sodium chloride  1 spray Each Nare QHS   Continuous Infusions: . ciprofloxacin 400 mg (05/30/18 1025)  . dextrose 5% lactated ringers 125 mL/hr at 05/30/18 0739  . metronidazole 500 mg (05/30/18 1022)   PRN Meds:.acetaminophen **OR** acetaminophen, calcium carbonate, fluticasone,  HYDROcodone-acetaminophen, HYDROmorphone (DILAUDID) injection, ibuprofen, ondansetron **OR** ondansetron (ZOFRAN) IV, senna, zolpidem   Data Review:   Micro Results No results found for this or any previous visit (from the past 240 hour(s)).  Radiology Reports US Abdomen Complete  Result Date: 05/28/2018 CLINICAL DATA:  Patient with abdominal pain for 5 days EXAM: ABDOMEN ULTRASOUND COMPLETE COMPARISON:  None. FINDINGS: Gallbladder: No gallstones or wall thickening visualized. No sonographic Murphy sign noted by sonographer. Common bile duct: Diameter: 4 mm Liver: No focal lesion identified. Within normal limits in parenchymal echogenicity. Portal vein is patent on color Doppler imaging with normal direction of blood flow towards the liver. IVC: No abnormality visualized. Pancreas: Visualized portion unremarkable. Spleen: Size and appearance within normal limits. Right Kidney: Length: 11.3 cm. Echogenicity within normal limits. No mass or hydronephrosis visualized. Left Kidney: Length: 11.9 cm. Echogenicity within normal limits. No mass or hydronephrosis visualized. Small echogenic structure left kidney favored to represent renal sinus fat. Abdominal aorta: No aneurysm visualized. Other findings: None. IMPRESSION: No hydronephrosis.  No acute process. Electronically Signed   By: Annia Belt M.D.   On: 05/28/2018 11:03   Dg Knee Complete 4 Views Right  Result Date: 05/08/2018 CLINICAL DATA:  Knee pain. Heard a pop in the right knee this morning. Unable to stand. Initial encounter. EXAM: RIGHT KNEE - COMPLETE 4+ VIEW COMPARISON:  02/27/2017 FINDINGS: There is no evidence of fracture, dislocation, or knee joint effusion. Mild medial compartment joint space narrowing is noted. The soft tissues are unremarkable. IMPRESSION: No acute osseous abnormality. Electronically Signed   By: Sebastian Ache M.D.   On: 05/08/2018 11:12   Ct Renal Stone Study  Result Date: 05/27/2018 CLINICAL DATA:  Acute epigastric  and right flank pain. EXAM: CT ABDOMEN AND PELVIS WITHOUT CONTRAST TECHNIQUE: Multidetector CT imaging of the abdomen and pelvis was performed following the standard protocol without IV contrast. COMPARISON:  CT scan of April 16, 2018. FINDINGS: Lower chest: No acute abnormality. Hepatobiliary: No focal liver abnormality is seen. No gallstones, gallbladder wall thickening, or biliary dilatation. Pancreas: Mild inflammatory changes are seen around the pancreatic head  and body concerning for pancreatitis. No ductal dilatation is noted. No pseudocyst formation is noted. Spleen: Normal in size without focal abnormality. Adrenals/Urinary Tract: Adrenal glands are unremarkable. Kidneys are normal, without renal calculi, focal lesion, or hydronephrosis. Bladder is unremarkable. Stomach/Bowel: Stomach is within normal limits. Appendix appears normal. No evidence of bowel wall thickening, distention, or inflammatory changes. Vascular/Lymphatic: No significant vascular findings are present. No enlarged abdominal or pelvic lymph nodes. Reproductive: Uterus and bilateral adnexa are unremarkable. Other: No abdominal wall hernia or abnormality. No abdominopelvic ascites. Musculoskeletal: No acute or significant osseous findings. IMPRESSION: Inflammatory changes are seen around the pancreatic head and body concerning for acute pancreatitis. No pseudocyst formation is seen at this time. Electronically Signed   By: Lupita Raider, M.D.   On: 05/27/2018 16:28     CBC Recent Labs  Lab 05/27/18 1421 05/28/18 0408 05/29/18 0301 05/30/18 0304  WBC 28.3* 23.9* 23.1* 15.1*  HGB 12.7 10.8* 10.5* 9.8*  HCT 36.8 32.4* 30.9* 27.9*  PLT 468* 379 365 329  MCV 89.0 91.5 91.7 90.9  MCH 30.9 30.4 31.1 31.7  MCHC 34.7 33.2 33.9 34.9  RDW 13.6 13.9 14.0 13.6    Chemistries  Recent Labs  Lab 05/27/18 1421 05/28/18 0408 05/29/18 0301 05/30/18 0304  NA 135 137 136 139  K 3.7 3.5 3.8 3.3*  CL 102 108 107 103  CO2 22 22 21*  26  GLUCOSE 145* 127* 97 115*  BUN 10 10 8 8   CREATININE 0.64 0.60 0.47 0.52  CALCIUM 9.0 8.3* 8.3* 8.6*  AST 14* 11*  --  24  ALT 13 12  --  18  ALKPHOS 77 67  --  75  BILITOT 0.6 0.8  --  0.6   ------------------------------------------------------------------------------------------------------------------ estimated creatinine clearance is 140 mL/min (by C-G formula based on SCr of 0.52 mg/dL). ------------------------------------------------------------------------------------------------------------------ No results for input(s): HGBA1C in the last 72 hours. ------------------------------------------------------------------------------------------------------------------ Recent Labs    05/28/18 0408  CHOL 149  HDL 65  LDLCALC 75  TRIG 46  CHOLHDL 2.3   ------------------------------------------------------------------------------------------------------------------ No results for input(s): TSH, T4TOTAL, T3FREE, THYROIDAB in the last 72 hours.  Invalid input(s): FREET3 ------------------------------------------------------------------------------------------------------------------ No results for input(s): VITAMINB12, FOLATE, FERRITIN, TIBC, IRON, RETICCTPCT in the last 72 hours.  Coagulation profile No results for input(s): INR, PROTIME in the last 168 hours.  No results for input(s): DDIMER in the last 72 hours.  Cardiac Enzymes No results for input(s): CKMB, TROPONINI, MYOGLOBIN in the last 168 hours.  Invalid input(s): CK ------------------------------------------------------------------------------------------------------------------ Invalid input(s): POCBNP    Assessment & Plan  Patient is a 37 year old presenting to the hospital with severe abdominal pain noted to have acute pancreatitis  1.Acute pancreatitis etiology unclear patient does not drink Continues to have some symptoms may need repeat CT scan tomorrow Appreciate GI input  2.Leukocytosis  continue IV antibiotics WBC is trending down today  3.Depression hold medications for now  4.Miscellaneous Lovenox DVT prophylaxis       Code Status Orders  (From admission, onward)         Start     Ordered   05/27/18 2030  Full code  Continuous     05/27/18 2029        Code Status History    Date Active Date Inactive Code Status Order ID Comments User Context   04/25/2017 0646 04/26/2017 1941 Full Code [id number]  Ihor Austin, MD Inpatient           Consults gastroenterology  DVT Prophylaxis  Lovenox  Lab Results  Component Value Date   PLT 329 05/30/2018     Time Spent in minutes   35 minutes  Greater than 50% of time spent in care coordination and counseling patient regarding the condition and plan of care.   Auburn Bilberry M.D on 05/30/2018 at 12:56 PM  Between 7am to 6pm - Pager - [phone]  After 6pm go to [email or web address] - Social research officer, government  [employer]   Office  [phone]

## 2018-05-30 NOTE — Care Management Note (Signed)
Case Management Note  Patient Details  Name: Kelsey Bell MRN: [id number] Date of Birth: 07-09-1981  Subjective/Objective:  Admitted to [facility] with the diagnosis of acute pancreatitis. Lives alone. Friend is Olegario Messier [phone]). Last seen Doristine Mango at [facility] a couple of weeks ago. Uses [facility] for prescriptions. No home Health. No skilled facility. No home oxygen. Crutches, nebulizer, rolling walker and cane in the home.  Takes care of all basic activities of daily living herself, drives.   No falls. Friend will transport              Action/Plan: No follow-up needs identified.   Expected Discharge Date:                  Expected Discharge Plan:     In-House Referral:   yes  Discharge planning Services   yes  Post Acute Care Choice:    Choice offered to:     DME Arranged:    DME Agency:     HH Arranged:    HH Agency:     Status of Service:     If discussed at [employer] of Stay Meetings, dates discussed:    Additional Comments:  Gwenette Greet, RN MSN CCM Care Management [phone] 05/30/2018, 1:47 PM

## 2018-05-30 NOTE — Progress Notes (Signed)
   [name] , MD [address] [place], [place], [place], [address] 3940 [address], [place], [place], [place], [address] Phone: [phone]  Fax: [phone]   Kelsey Bell is being followed for acute pancreatitis   Subjective: Complains of severe nausea and some abdominal pain    Objective: Vital signs in last 24 hours: Vitals:   05/29/18 1212 05/29/18 1303 05/29/18 2013 05/30/18 0358  BP:  (!) 177/89 (!) 147/91 130/75  Pulse:  95 97 80  Resp:  '18 19 16  '$ Temp: 98.2 F (36.8 C) 98.4 F (36.9 C) 98.5 F (36.9 C) 97.7 F (36.5 C)  TempSrc: Oral Oral Oral Oral  SpO2:  98% 100% 100%  Weight:      Height:       Weight change:   Intake/Output Summary (Last 24 hours) at 05/30/2018 0930 Last data filed at 05/30/2018 2505 Gross per 24 hour  Intake 3489.93 ml  Output -  Net 3489.93 ml     Exam: Heart:: Regular rate and rhythm, S1S2 present or without murmur or extra heart sounds Lungs: normal, clear to auscultation and clear to auscultation and percussion Abdomen: soft, nontender, normal bowel sounds   Lab Results: '@LABTEST2'$ @ Micro Results: No results found for this or any previous visit (from the past 240 hour(s)). Studies/Results: No results found. Medications: I have reviewed the patient's current medications. Scheduled Meds: . docusate sodium  200 mg Oral BID  . enoxaparin (LOVENOX) injection  40 mg Subcutaneous Q12H  . FLUoxetine  80 mg Oral QHS  . montelukast  10 mg Oral QHS  . polyethylene glycol  17 g Oral Daily  . sodium chloride  1 spray Each Nare QHS   Continuous Infusions: . ciprofloxacin 400 mg (05/29/18 2119)  . dextrose 5% lactated ringers 125 mL/hr at 05/30/18 0739  . metronidazole Stopped (05/30/18 0317)   PRN Meds:.acetaminophen **OR** acetaminophen, calcium carbonate, fluticasone, HYDROcodone-acetaminophen, HYDROmorphone (DILAUDID) injection, ibuprofen, ondansetron **OR** ondansetron (ZOFRAN) IV, senna, zolpidem  Hepatic Function  Latest Ref Rng & Units 05/28/2018 05/27/2018 08/25/2015  Total Protein 6.5 - 8.1 g/dL 7.1 8.1 7.6  Albumin 3.5 - 5.0 g/dL 3.3(L) 4.2 4.1  AST 15 - 41 U/L 11(L) 14(L) 15  ALT 0 - 44 U/L '12 13 17  '$ Alk Phosphatase 38 - 126 U/L 67 77 76  Total Bilirubin 0.3 - 1.2 mg/dL 0.8 0.6 0.3  Bilirubin, Direct 0.0 - 0.2 mg/dL 0.2 - -    Assessment: Active Problems:   Acute pancreatitis  Kelsey B Agnewis a 37 y.o.y/o femalewith acute pancreatitis. Transaminases, total bilirubin, alkaline phosphatase are normal making this less likely secondary to gallstones.I did explain that about 30% of pancreatitis the cause is idiopathic or unknown.RUQ USG ,IGG4 normal   Plan 1. Severe nausea that I suspect secondary to metronidazole. She does not have clear evidence of infection and WCC has come down , the elevated counts could be secondary to the pancreatitis. We can stop antibiotics to see if she feels better , if not better may need repeat CT scan of the abdomen tomorrow  3. Suggest MRI of the pancreas in 6 to 8 weeks once inflammation has resolved to r/o any underlying pancreatic lesions that trigger the pancreatitis.     LOS: 3 days   [name], MD 05/30/2018, 9:30 AM

## 2018-05-31 ENCOUNTER — Inpatient Hospital Stay: Payer: Self-pay

## 2018-05-31 DIAGNOSIS — R1013 Epigastric pain: Secondary | ICD-10-CM

## 2018-05-31 LAB — CBC WITH DIFFERENTIAL/PLATELET
BASOS PCT: 1 %
Basophils Absolute: 0.1 10*3/uL (ref 0–0.1)
EOS ABS: 0.4 10*3/uL (ref 0–0.7)
EOS PCT: 4 %
HCT: 28.8 % — ABNORMAL LOW (ref 35.0–47.0)
Hemoglobin: 9.7 g/dL — ABNORMAL LOW (ref 12.0–16.0)
Lymphocytes Relative: 17 %
Lymphs Abs: 1.8 10*3/uL (ref 1.0–3.6)
MCH: 30.8 pg (ref 26.0–34.0)
MCHC: 33.7 g/dL (ref 32.0–36.0)
MCV: 91.3 fL (ref 80.0–100.0)
MONO ABS: 0.8 10*3/uL (ref 0.2–0.9)
MONOS PCT: 7 %
NEUTROS PCT: 71 %
Neutro Abs: 7.8 10*3/uL — ABNORMAL HIGH (ref 1.4–6.5)
PLATELETS: 375 10*3/uL (ref 150–440)
RBC: 3.15 MIL/uL — ABNORMAL LOW (ref 3.80–5.20)
RDW: 13.7 % (ref 11.5–14.5)
WBC: 10.8 10*3/uL (ref 3.6–11.0)

## 2018-05-31 LAB — MAGNESIUM: MAGNESIUM: 1.7 mg/dL (ref 1.7–2.4)

## 2018-05-31 LAB — BASIC METABOLIC PANEL
Anion gap: 9 (ref 5–15)
BUN: 5 mg/dL — AB (ref 6–20)
CALCIUM: 8.7 mg/dL — AB (ref 8.9–10.3)
CO2: 28 mmol/L (ref 22–32)
CREATININE: 0.59 mg/dL (ref 0.44–1.00)
Chloride: 101 mmol/L (ref 98–111)
GFR calc non Af Amer: >60 mL/min (ref >60)
Glucose, Bld: 135 mg/dL — ABNORMAL HIGH (ref 70–99)
Potassium: 4.1 mmol/L (ref 3.5–5.1)
SODIUM: 138 mmol/L (ref 135–145)

## 2018-05-31 LAB — PHOSPHORUS: PHOSPHORUS: 3.9 mg/dL (ref 2.5–4.6)

## 2018-05-31 MED ORDER — SODIUM CHLORIDE 0.9% FLUSH: 10.0000 mL | INTRAVENOUS | Status: DC | PRN

## 2018-05-31 MED ORDER — SODIUM CHLORIDE 0.9% FLUSH
10.0000 mL | Freq: Two times a day (BID) | INTRAVENOUS | Status: DC
  Administered 2018-05-31 – 2018-06-01 (×2): 10 mL
  Administered 2018-06-01: 20 mL
  Administered 2018-06-02 – 2018-06-06 (×9): 10 mL

## 2018-05-31 NOTE — Progress Notes (Signed)
[name] , MD [address], [place], [place], [address] [address]40 [address], [place], [place], [address] Phone: [phone]  Fax: [phone]   Kelsey Bell is being followed for acute pancreatitis   Subjective: No nausea but has abdominal pain   Objective: Vital signs in last 24 hours: Vitals:   05/30/18 1650 05/30/18 1928 05/31/18 0400 05/31/18 1257  BP:  (!) 152/84 135/83 136/72  Pulse:  91 90 74  Resp:  19 19 20   Temp: 98.1 F (36.7 C) 99 F (37.2 C) 98 F (36.7 C) 98.5 F (36.9 C)  TempSrc: Oral Oral Oral Oral  SpO2:  99% 97% 99%  Weight:      Height:       Weight change:   Intake/Output Summary (Last 24 hours) at 05/31/2018 1543 Last data filed at 05/31/2018 1411 Gross per 24 hour  Intake 2944.82 ml  Output -  Net 2944.82 ml     Exam: Heart:: Regular rate and rhythm, S1S2 present or without murmur or extra heart sounds Lungs: normal, clear to auscultation and clear to auscultation and percussion Abdomen: soft, mild epigastric tenderness,  normal bowel sounds   Lab Results: @LABTEST2 @ Micro Results: No results found for this or any previous visit (from the past 240 hour(s)). Studies/Results: Ct Abdomen Pelvis Wo Contrast  Result Date: 05/31/2018 CLINICAL DATA:  Upper abdominal pain. EXAM: CT ABDOMEN AND PELVIS WITHOUT CONTRAST TECHNIQUE: Multidetector CT imaging of the abdomen and pelvis was performed following the standard protocol without IV contrast. COMPARISON:  CT scan of May 27, 2018. FINDINGS: Lower chest: No acute abnormality. Hepatobiliary: No focal liver abnormality is seen. No gallstones, gallbladder wall thickening, or biliary dilatation. Pancreas: Significantly increased inflammatory changes are seen involving the entire pancreas consistent with worsening pancreatitis. No pseudocyst formation is seen at this time. No ductal dilatation is noted. Spleen: Normal in size without focal abnormality. Adrenals/Urinary Tract:  Adrenal glands are unremarkable. Small nonobstructive left renal calculus is noted. No hydronephrosis or renal obstruction is noted. Bladder is unremarkable. Stomach/Bowel: Stomach is within normal limits. Appendix appears normal. No evidence of bowel wall thickening, distention, or inflammatory changes. Vascular/Lymphatic: No significant vascular findings are present. No enlarged abdominal or pelvic lymph nodes. Reproductive: Uterus and bilateral adnexa are unremarkable. Other: No abdominal wall hernia or abnormality. No abdominopelvic ascites. Musculoskeletal: No acute or significant osseous findings. IMPRESSION: Significantly increased peripancreatic inflammatory changes consistent with worsening pancreatitis. No pseudocyst formation is noted. Small nonobstructive left renal calculus. No hydronephrosis or renal obstruction is noted. Electronically Signed   By: Lupita Raider, M.D.   On: 05/31/2018 14:37   [place] Ekg Site Rite  Result Date: 05/31/2018 If Site Rite image not attached, placement could not be confirmed due to current cardiac rhythm.  Medications: I have reviewed the patient's current medications. Scheduled Meds: . docusate sodium  200 mg Oral BID  . enoxaparin (LOVENOX) injection  40 mg Subcutaneous Q12H  . FLUoxetine  80 mg Oral QHS  . montelukast  10 mg Oral QHS  . polyethylene glycol  17 g Oral Daily  . sodium chloride  1 spray Each Nare QHS   Continuous Infusions: . dextrose 5 % lactated ringers with kcl 125 mL/hr at 05/31/18 1039   PRN Meds:.acetaminophen **OR** acetaminophen, calcium carbonate, fluticasone, HYDROcodone-acetaminophen, HYDROmorphone (DILAUDID) injection, ibuprofen, ondansetron **OR** ondansetron (ZOFRAN) IV, senna, zolpidem   Assessment: Active Problems:   Acute pancreatitis  Kelsey B Agnewis a 36 y.o.y/o femalewith acute pancreatitis. Transaminases, total  bilirubin, alkaline phosphatase are normal making this less likely secondary to gallstones.I did  explain that about 30% of pancreatitis the cause is idiopathic or unknown.RUQ USG ,IGG4 normal.  Repeat CT scan of the abdomen performed today shows worsening of the pancreatitis but no pseudocyst formation. Fluoxetine <1 % mention about pancreatitis.   Plan 1. Suggest MRI of the pancreas in 6 to 8 weeks once inflammation has resolved to r/o any underlying pancreatic lesions that trigger the pancreatitis. 2.  Agree with keeping n.p.o. commencing on TPN since it is going to be almost 5 days since admission.  Continue IV fluids to ensure she is well hydrated.     LOS: 4 days   [name], MD 05/31/2018, 3:43 PM

## 2018-05-31 NOTE — Progress Notes (Signed)
Peripherally Inserted Central Catheter/Midline Placement  The IV Nurse has discussed with the patient and/or persons authorized to consent for the patient, the purpose of this procedure and the potential benefits and risks involved with this procedure.  The benefits include less needle sticks, lab draws from the catheter, and the patient may be discharged home with the catheter. Risks include, but not limited to, infection, bleeding, blood clot (thrombus formation), and puncture of an artery; nerve damage and irregular heartbeat and possibility to perform a PICC exchange if needed/ordered by physician.  Alternatives to this procedure were also discussed.  Bard Power PICC patient education guide, fact sheet on infection prevention and patient information card has been provided to patient /or left at bedside.    PICC/Midline Placement Documentation  PICC Double Lumen 05/31/18 PICC Right Cephalic 39 cm 1 cm (Active)  Indication for Insertion or Continuance of Line Administration of hyperosmolar/irritating solutions (i.e. TPN, Vancomycin, etc.) 05/31/2018  8:33 PM  Exposed Catheter (cm) 1 cm 05/31/2018  8:33 PM  Site Assessment Clean;Dry;Intact 05/31/2018  8:33 PM  Lumen #1 Status Blood return noted;Flushed;Saline locked 05/31/2018  8:33 PM  Lumen #2 Status Blood return noted;Flushed;Saline locked 05/31/2018  8:33 PM  Dressing Type Transparent;Securing device 05/31/2018  8:33 PM  Dressing Status Clean;Dry;Intact;Antimicrobial disc in place 05/31/2018  8:33 PM  Line Adjustment (NICU/IV Team Only) No 05/31/2018  8:33 PM  Dressing Intervention New dressing 05/31/2018  8:33 PM  Dressing Change Due 06/07/18 05/31/2018  8:33 PM       Christeen Douglas 05/31/2018, 8:48 PM

## 2018-05-31 NOTE — Progress Notes (Signed)
[facility] - [place] at [facility]                                                                                                                                                                                  Patient Demographics   Kelsey Bell, is a 37 y.o. female, DOB - 10-19-1980, ZOX:[id number]  Admit date - 05/27/2018   Admitting Physician Auburn Bilberry, MD  Outpatient Primary MD for the patient is System, Pcp Not In   LOS - 4  Subjective: Patient continues to have significant abdominal pain and nausea    Review of Systems:   CONSTITUTIONAL: No documented fever. No fatigue, weakness. No weight gain, no weight loss.  EYES: No blurry or double vision.  ENT: No tinnitus. No postnasal drip. No redness of the oropharynx.  RESPIRATORY: No cough, no wheeze, no hemoptysis. No dyspnea.  CARDIOVASCULAR: No chest pain. No orthopnea. No palpitations. No syncope.  GASTROINTESTINAL: No nausea, no vomiting or diarrhea.  Positive abdominal pain. No melena or hematochezia.  GENITOURINARY: No dysuria or hematuria.  ENDOCRINE: No polyuria or nocturia. No heat or cold intolerance.  HEMATOLOGY: No anemia. No bruising. No bleeding.  INTEGUMENTARY: No rashes. No lesions.  MUSCULOSKELETAL: No arthritis. No swelling. No gout.  NEUROLOGIC: No numbness, tingling, or ataxia. No seizure-type activity.  PSYCHIATRIC: No anxiety. No insomnia. No ADD.    Vitals:   Vitals:   05/30/18 1650 05/30/18 1928 05/31/18 0400 05/31/18 1257  BP:  (!) 152/84 135/83 136/72  Pulse:  91 90 74  Resp:  19 19 20   Temp: 98.1 F (36.7 C) 99 F (37.2 C) 98 F (36.7 C) 98.5 F (36.9 C)  TempSrc: Oral Oral Oral Oral  SpO2:  99% 97% 99%  Weight:      Height:        Wt Readings from Last 3 Encounters:  05/27/18 (!) 137.9 kg  05/08/18 117.9 kg  03/04/18 135.4 kg     Intake/Output Summary (Last 24 hours) at 05/31/2018 1511 Last data filed at 05/31/2018 1411 Gross per 24 hour  Intake 2944.82 ml   Output -  Net 2944.82 ml    Physical Exam:   GENERAL: Pleasant-appearing in no apparent distress.  HEAD, EYES, EARS, NOSE AND THROAT: Atraumatic, normocephalic. Extraocular muscles are intact. Pupils equal and reactive to light. Sclerae anicteric. No conjunctival injection. No oro-pharyngeal erythema.  NECK: Supple. There is no jugular venous distention. No bruits, no lymphadenopathy, no thyromegaly.  HEART: Regular rate and rhythm,. No murmurs, no rubs, no clicks.  LUNGS: Clear to auscultation bilaterally. No rales or rhonchi. No wheezes.  ABDOMEN: Soft, positive tenderness no guarding has good bowel sounds. No hepatosplenomegaly  appreciated.  EXTREMITIES: No evidence of any cyanosis, clubbing, or peripheral edema.  +2 pedal and radial pulses bilaterally.  NEUROLOGIC: The patient is alert, awake, and oriented x3 with no focal motor or sensory deficits appreciated bilaterally.  SKIN: Moist and warm with no rashes appreciated.  Psych: Not anxious, depressed LN: No inguinal LN enlargement    Antibiotics   Anti-infectives (From admission, onward)   Start     Dose/Rate Route Frequency Ordered Stop   05/29/18 1000  ciprofloxacin (CIPRO) IVPB 400 mg  Status:  Discontinued     400 mg 200 mL/hr over 60 Minutes Intravenous Every 12 hours 05/29/18 0919 05/30/18 1724   05/29/18 1000  metroNIDAZOLE (FLAGYL) IVPB 500 mg  Status:  Discontinued     500 mg 100 mL/hr over 60 Minutes Intravenous Every 8 hours 05/29/18 0919 05/30/18 1724      Medications   Scheduled Meds: . docusate sodium  200 mg Oral BID  . enoxaparin (LOVENOX) injection  40 mg Subcutaneous Q12H  . FLUoxetine  80 mg Oral QHS  . montelukast  10 mg Oral QHS  . polyethylene glycol  17 g Oral Daily  . sodium chloride  1 spray Each Nare QHS   Continuous Infusions: . dextrose 5 % lactated ringers with kcl 125 mL/hr at 05/31/18 1039   PRN Meds:.acetaminophen **OR** acetaminophen, calcium carbonate, fluticasone,  HYDROcodone-acetaminophen, HYDROmorphone (DILAUDID) injection, ibuprofen, ondansetron **OR** ondansetron (ZOFRAN) IV, senna, zolpidem   Data Review:   Micro Results No results found for this or any previous visit (from the past 240 hour(s)).  Radiology Reports Ct Abdomen Pelvis Wo Contrast  Result Date: 05/31/2018 CLINICAL DATA:  Upper abdominal pain. EXAM: CT ABDOMEN AND PELVIS WITHOUT CONTRAST TECHNIQUE: Multidetector CT imaging of the abdomen and pelvis was performed following the standard protocol without IV contrast. COMPARISON:  CT scan of May 27, 2018. FINDINGS: Lower chest: No acute abnormality. Hepatobiliary: No focal liver abnormality is seen. No gallstones, gallbladder wall thickening, or biliary dilatation. Pancreas: Significantly increased inflammatory changes are seen involving the entire pancreas consistent with worsening pancreatitis. No pseudocyst formation is seen at this time. No ductal dilatation is noted. Spleen: Normal in size without focal abnormality. Adrenals/Urinary Tract: Adrenal glands are unremarkable. Small nonobstructive left renal calculus is noted. No hydronephrosis or renal obstruction is noted. Bladder is unremarkable. Stomach/Bowel: Stomach is within normal limits. Appendix appears normal. No evidence of bowel wall thickening, distention, or inflammatory changes. Vascular/Lymphatic: No significant vascular findings are present. No enlarged abdominal or pelvic lymph nodes. Reproductive: Uterus and bilateral adnexa are unremarkable. Other: No abdominal wall hernia or abnormality. No abdominopelvic ascites. Musculoskeletal: No acute or significant osseous findings. IMPRESSION: Significantly increased peripancreatic inflammatory changes consistent with worsening pancreatitis. No pseudocyst formation is noted. Small nonobstructive left renal calculus. No hydronephrosis or renal obstruction is noted. Electronically Signed   By: Lupita Raider, M.D.   On: 05/31/2018  14:37   US Abdomen Complete  Result Date: 05/28/2018 CLINICAL DATA:  Patient with abdominal pain for 5 days EXAM: ABDOMEN ULTRASOUND COMPLETE COMPARISON:  None. FINDINGS: Gallbladder: No gallstones or wall thickening visualized. No sonographic Murphy sign noted by sonographer. Common bile duct: Diameter: 4 mm Liver: No focal lesion identified. Within normal limits in parenchymal echogenicity. Portal vein is patent on color Doppler imaging with normal direction of blood flow towards the liver. IVC: No abnormality visualized. Pancreas: Visualized portion unremarkable. Spleen: Size and appearance within normal limits. Right Kidney: Length: 11.3 cm. Echogenicity within normal limits. No mass  or hydronephrosis visualized. Left Kidney: Length: 11.9 cm. Echogenicity within normal limits. No mass or hydronephrosis visualized. Small echogenic structure left kidney favored to represent renal sinus fat. Abdominal aorta: No aneurysm visualized. Other findings: None. IMPRESSION: No hydronephrosis.  No acute process. Electronically Signed   By: Annia Belt M.D.   On: 05/28/2018 11:03   Dg Knee Complete 4 Views Right  Result Date: 05/08/2018 CLINICAL DATA:  Knee pain. Heard a pop in the right knee this morning. Unable to stand. Initial encounter. EXAM: RIGHT KNEE - COMPLETE 4+ VIEW COMPARISON:  02/27/2017 FINDINGS: There is no evidence of fracture, dislocation, or knee joint effusion. Mild medial compartment joint space narrowing is noted. The soft tissues are unremarkable. IMPRESSION: No acute osseous abnormality. Electronically Signed   By: Sebastian Ache M.D.   On: 05/08/2018 11:12   Ct Renal Stone Study  Result Date: 05/27/2018 CLINICAL DATA:  Acute epigastric and right flank pain. EXAM: CT ABDOMEN AND PELVIS WITHOUT CONTRAST TECHNIQUE: Multidetector CT imaging of the abdomen and pelvis was performed following the standard protocol without IV contrast. COMPARISON:  CT scan of April 16, 2018. FINDINGS: Lower chest: No  acute abnormality. Hepatobiliary: No focal liver abnormality is seen. No gallstones, gallbladder wall thickening, or biliary dilatation. Pancreas: Mild inflammatory changes are seen around the pancreatic head and body concerning for pancreatitis. No ductal dilatation is noted. No pseudocyst formation is noted. Spleen: Normal in size without focal abnormality. Adrenals/Urinary Tract: Adrenal glands are unremarkable. Kidneys are normal, without renal calculi, focal lesion, or hydronephrosis. Bladder is unremarkable. Stomach/Bowel: Stomach is within normal limits. Appendix appears normal. No evidence of bowel wall thickening, distention, or inflammatory changes. Vascular/Lymphatic: No significant vascular findings are present. No enlarged abdominal or pelvic lymph nodes. Reproductive: Uterus and bilateral adnexa are unremarkable. Other: No abdominal wall hernia or abnormality. No abdominopelvic ascites. Musculoskeletal: No acute or significant osseous findings. IMPRESSION: Inflammatory changes are seen around the pancreatic head and body concerning for acute pancreatitis. No pseudocyst formation is seen at this time. Electronically Signed   By: Lupita Raider, M.D.   On: 05/27/2018 16:28     CBC Recent Labs  Lab 05/27/18 1421 05/28/18 0408 05/29/18 0301 05/30/18 0304 05/31/18 0821  WBC 28.3* 23.9* 23.1* 15.1* 10.8  HGB 12.7 10.8* 10.5* 9.8* 9.7*  HCT 36.8 32.4* 30.9* 27.9* 28.8*  PLT 468* 379 365 329 375  MCV 89.0 91.5 91.7 90.9 91.3  MCH 30.9 30.4 31.1 31.7 30.8  MCHC 34.7 33.2 33.9 34.9 33.7  RDW 13.6 13.9 14.0 13.6 13.7  LYMPHSABS  --   --   --   --  1.8  MONOABS  --   --   --   --  0.8  EOSABS  --   --   --   --  0.4  BASOSABS  --   --   --   --  0.1    Chemistries  Recent Labs  Lab 05/27/18 1421 05/28/18 0408 05/29/18 0301 05/30/18 0304 05/31/18 0821  NA 135 137 136 139 138  K 3.7 3.5 3.8 3.3* 4.1  CL 102 108 107 103 101  CO2 22 22 21* 26 28  GLUCOSE 145* 127* 97 115* 135*   BUN 10 10 8 8  5*  CREATININE 0.64 0.60 0.47 0.52 0.59  CALCIUM 9.0 8.3* 8.3* 8.6* 8.7*  AST 14* 11*  --  24  --   ALT 13 12  --  18  --   ALKPHOS 77 67  --  75  --   BILITOT 0.6 0.8  --  0.6  --    ------------------------------------------------------------------------------------------------------------------ estimated creatinine clearance is 140 mL/min (by C-G formula based on SCr of 0.59 mg/dL). ------------------------------------------------------------------------------------------------------------------ No results for input(s): HGBA1C in the last 72 hours. ------------------------------------------------------------------------------------------------------------------ No results for input(s): CHOL, HDL, LDLCALC, TRIG, CHOLHDL, LDLDIRECT in the last 72 hours. ------------------------------------------------------------------------------------------------------------------ No results for input(s): TSH, T4TOTAL, T3FREE, THYROIDAB in the last 72 hours.  Invalid input(s): FREET3 ------------------------------------------------------------------------------------------------------------------ No results for input(s): VITAMINB12, FOLATE, FERRITIN, TIBC, IRON, RETICCTPCT in the last 72 hours.  Coagulation profile No results for input(s): INR, PROTIME in the last 168 hours.  No results for input(s): DDIMER in the last 72 hours.  Cardiac Enzymes No results for input(s): CKMB, TROPONINI, MYOGLOBIN in the last 168 hours.  Invalid input(s): CK ------------------------------------------------------------------------------------------------------------------ Invalid input(s): POCBNP    Assessment & Plan  Patient is a 37 year old presenting to the hospital with severe abdominal pain noted to have acute pancreatitis  1.Acute pancreatitis etiology unclear patient does not drink, CT suggest worsening pancreatitis at this point need to make patient complete n.p.o. and give bowel  rest Appreciate GI input I will start patient on TPN for now  2.Leukocytosis continue IV antibiotics WBC normal today  3.Depression hold medications for now  4.Miscellaneous Lovenox DVT prophylaxis       Code Status Orders  (From admission, onward)         Start     Ordered   05/27/18 2030  Full code  Continuous     05/27/18 2029        Code Status History    Date Active Date Inactive Code Status Order ID Comments User Context   04/25/2017 0646 04/26/2017 1941 Full Code [id number]  Ihor Austin, MD Inpatient           Consults gastroenterology  DVT Prophylaxis  Lovenox  Lab Results  Component Value Date   PLT 375 05/31/2018     Time Spent in minutes   35 minutes  Greater than 50% of time spent in care coordination and counseling patient regarding the condition and plan of care.   Auburn Bilberry M.D on 05/31/2018 at 3:11 PM  Between 7am to 6pm - Pager - [phone]  After 6pm go to [email or web address] - Social research officer, government  [employer]   Office  [phone]

## 2018-05-31 NOTE — Progress Notes (Addendum)
PHARMACY - ADULT TOTAL PARENTERAL NUTRITION CONSULT NOTE   Pharmacy Consult for TPN Indication: severe pancreatitis  Patient Measurements: Height: 5\' 7"  (170.2 cm) Weight: (!) 304 lb (137.9 kg) IBW/kg (Calculated) : 61.6 TPN AdjBW (KG): 80.7 Body mass index is 47.61 kg/m.   Assessment:  37 yo female here with pancreatitis. Per MD note CT suggest worsening pancreatitis at this point need to make patient complete n.p.o. and give bowel rest    Endo: no history of diabetes Insulin requirements in the past 24 hours: none Lytes: K 4.1 Renal:  SCr 0.59, CrCl ~140 ml/min  TPN Access: order for PICC to be placed TPN start date:  Nutritional Goals : Will need to follow up with dietitian - this is an after-hours start Current Nutrition:  NPO  Plan:  Consult to dietitian Dicussed with dietitian that we will follow after-hours TPN policy as this is a late start Will follow until ~5-6 pm for PICC line placement and if PICC is NOT placed, we will start TPN tomorrow. Per dietitian, ok if unable to start TPN today and if delayed to tomorrow due to PICC line needing to be placed.    Add on Phos and Mag ordered for today - phos 3.9, Mag 1.7 - WNL BMET, Mag, Phos, albumin, triglycerides ordered for tomorrow Pt without hx of diabetes, will start SSI when TPN is ordered Daily weights and strict in/outs  Addendum: As of 2000, PICC line not yet placed. Will hold off on ordering TPN tonight and order tomorrow. Likely will need to order Clinimix without electrolyes 5/15 at 40 ml/hr if dietitian unable to review patient tomorrow.   Pharmacy will continue to follow.   Marty Heck 05/31/2018,3:54 PM

## 2018-06-01 LAB — CBC WITH DIFFERENTIAL/PLATELET
BASOS ABS: 0.1 10*3/uL (ref 0–0.1)
Basophils Relative: 1 %
Eosinophils Absolute: 0.3 10*3/uL (ref 0–0.7)
Eosinophils Relative: 3 %
HEMATOCRIT: 30.5 % — AB (ref 35.0–47.0)
HEMOGLOBIN: 11.1 g/dL — AB (ref 12.0–16.0)
LYMPHS ABS: 2.2 10*3/uL (ref 1.0–3.6)
LYMPHS PCT: 22 %
MCH: 32.9 pg (ref 26.0–34.0)
MCHC: 36.4 g/dL — ABNORMAL HIGH (ref 32.0–36.0)
MCV: 90.5 fL (ref 80.0–100.0)
MONOS PCT: 8 %
Monocytes Absolute: 0.8 10*3/uL (ref 0.2–0.9)
Neutro Abs: 6.6 10*3/uL — ABNORMAL HIGH (ref 1.4–6.5)
Neutrophils Relative %: 66 %
PLATELETS: 367 10*3/uL (ref 150–440)
RBC: 3.37 MIL/uL — AB (ref 3.80–5.20)
RDW: 13.5 % (ref 11.5–14.5)
WBC: 9.9 10*3/uL (ref 3.6–11.0)

## 2018-06-01 LAB — ALBUMIN: Albumin: 2.8 g/dL — ABNORMAL LOW (ref 3.5–5.0)

## 2018-06-01 LAB — BASIC METABOLIC PANEL
Anion gap: 8 (ref 5–15)
BUN: <5 mg/dL — ABNORMAL LOW (ref 6–20)
CALCIUM: 8.8 mg/dL — AB (ref 8.9–10.3)
CO2: 29 mmol/L (ref 22–32)
CREATININE: 0.52 mg/dL (ref 0.44–1.00)
Chloride: 102 mmol/L (ref 98–111)
GFR calc non Af Amer: >60 mL/min (ref >60)
Glucose, Bld: 120 mg/dL — ABNORMAL HIGH (ref 70–99)
Potassium: 4.2 mmol/L (ref 3.5–5.1)
SODIUM: 139 mmol/L (ref 135–145)

## 2018-06-01 LAB — HEPATIC FUNCTION PANEL
ALBUMIN: 2.8 g/dL — AB (ref 3.5–5.0)
ALK PHOS: 66 U/L (ref 38–126)
ALT: 18 U/L (ref 0–44)
AST: 18 U/L (ref 15–41)
Bilirubin, Direct: <0.1 mg/dL (ref 0.0–0.2)
TOTAL PROTEIN: 6.4 g/dL — AB (ref 6.5–8.1)
Total Bilirubin: 0.6 mg/dL (ref 0.3–1.2)

## 2018-06-01 LAB — GLUCOSE, CAPILLARY: GLUCOSE-CAPILLARY: 99 mg/dL (ref 70–99)

## 2018-06-01 LAB — PHOSPHORUS: PHOSPHORUS: 4.8 mg/dL — AB (ref 2.5–4.6)

## 2018-06-01 LAB — TRIGLYCERIDES: Triglycerides: 77 mg/dL (ref <150)

## 2018-06-01 LAB — MAGNESIUM: MAGNESIUM: 1.7 mg/dL (ref 1.7–2.4)

## 2018-06-01 MED ORDER — TRACE MINERALS CR-CU-MN-SE-ZN 10-1000-500-60 MCG/ML IV SOLN
INTRAVENOUS | Status: DC
  Filled 2018-06-01: qty 960

## 2018-06-01 MED ORDER — MECLIZINE HCL 25 MG PO TABS
25.0000 mg | ORAL_TABLET | Freq: Three times a day (TID) | ORAL | Status: DC | PRN
  Administered 2018-06-01 – 2018-06-04 (×2): 25 mg via ORAL
  Filled 2018-06-01 (×3): qty 1

## 2018-06-01 MED ORDER — INSULIN ASPART 100 UNIT/ML ~~LOC~~ SOLN
0.0000 [IU] | Freq: Four times a day (QID) | SUBCUTANEOUS | Status: DC
  Administered 2018-06-02: 1 [IU] via SUBCUTANEOUS
  Administered 2018-06-03: 2 [IU] via SUBCUTANEOUS
  Administered 2018-06-03 – 2018-06-05 (×8): 1 [IU] via SUBCUTANEOUS
  Administered 2018-06-05: 13:00:00 2 [IU] via SUBCUTANEOUS
  Administered 2018-06-05 – 2018-06-06 (×3): 1 [IU] via SUBCUTANEOUS
  Filled 2018-06-01 (×15): qty 1

## 2018-06-01 MED ORDER — TRACE MINERALS CR-CU-MN-SE-ZN 10-1000-500-60 MCG/ML IV SOLN
INTRAVENOUS | Status: AC
  Administered 2018-06-01: 19:00:00 via INTRAVENOUS
  Filled 2018-06-01: qty 960

## 2018-06-01 MED ORDER — MAGNESIUM SULFATE 2 GM/50ML IV SOLN
2.0000 g | Freq: Once | INTRAVENOUS | Status: AC
  Administered 2018-06-01: 2 g via INTRAVENOUS
  Filled 2018-06-01: qty 50

## 2018-06-01 NOTE — Progress Notes (Signed)
PHARMACY - ADULT TOTAL PARENTERAL NUTRITION CONSULT NOTE   Pharmacy Consult for TPN Indication: severe pancreatitis  Patient Measurements: Height: 5\' 7"  (170.2 cm) Weight: (!) 319 lb 14.4 oz (145.1 kg) IBW/kg (Calculated) : 61.6 TPN AdjBW (KG): 80.7 Body mass index is 50.1 kg/m.   Assessment:  37 yo female here with pancreatitis. Per MD note CT suggest worsening pancreatitis at this point need to make patient complete n.p.o. and give bowel rest    Endo: no history of diabetes Insulin requirements in the past 24 hours: none Lytes: K 4.2, Mg 1.7 Renal:  SCr 0.52, CrCl ~140 ml/min  TPN Access: DL PICC placed 9/60/45 40:98 TPN start date: 06/01/18 Nutritional Goals : RD Saturday/after hours recommends Clinimix 5/15 at 40 mL/hr with MVI and trace and full recommendations to follow on Sunday Current Nutrition:  NPO  Plan:  Consult to dietitian Start TPN today with full recommendations to follow per RD   Baseline labs assessed - give magnesium 2 gm IV x 1 and recheck labs per protocol.   SSI sensitive Q6H ordered.  Daily weights and strict in/outs  Pharmacy will continue to follow.   Carola Frost, Pharm.D., BCPS Clinical Pharmacist 06/01/2018,8:04 AM

## 2018-06-01 NOTE — Progress Notes (Signed)
Patient c/o dizziness, MD to be made aware. Bo Mcclintock, RN

## 2018-06-01 NOTE — Progress Notes (Signed)
[employer] - [place] at [facility]   PATIENT NAME: Kelsey Bell    MR#:  [id number]  DATE OF BIRTH:  08-22-1981  SUBJECTIVE:  CHIEF COMPLAINT:   Chief Complaint  Patient presents with  . Abdominal Pain  . Nausea  Feeling better, no events overnight  REVIEW OF SYSTEMS:  CONSTITUTIONAL: No fever, fatigue or weakness.  EYES: No blurred or double vision.  EARS, NOSE, AND THROAT: No tinnitus or ear pain.  RESPIRATORY: No cough, shortness of breath, wheezing or hemoptysis.  CARDIOVASCULAR: No chest pain, orthopnea, edema.  GASTROINTESTINAL: No nausea, vomiting, diarrhea or abdominal pain.  GENITOURINARY: No dysuria, hematuria.  ENDOCRINE: No polyuria, nocturia,  HEMATOLOGY: No anemia, easy bruising or bleeding SKIN: No rash or lesion. MUSCULOSKELETAL: No joint pain or arthritis.   NEUROLOGIC: No tingling, numbness, weakness.  PSYCHIATRY: No anxiety or depression.   ROS  DRUG ALLERGIES:   Allergies  Allergen Reactions  . Cefdinir Hives  . Esomeprazole Anaphylaxis  . Esomeprazole Magnesium Anaphylaxis  . Gabapentin Swelling  . Hydroxychloroquine Rash and Shortness Of Breath  . Tapentadol Other (See Comments)    Other reaction(s): Other (See Comments) Body aches and pains, and itching. Swelling.   HEADACHES Body aches and pains, and itching. Swelling. Body aches and pains, and itching. Swelling.  . Nsaids Nausea Only and Other (See Comments)    Other: acid reflux  . Sumatriptan Succinate Other (See Comments)  . Cortisone Other (See Comments)    pain pain  . Duloxetine Other (See Comments)    Hot flashed, sweating  . Prednisone Other (See Comments)    Muscle spasms  . Amoxicillin Rash    Has patient had a PCN reaction causing immediate rash, facial/tongue/throat swelling, SOB or lightheadedness with hypotension: Unknown Has patient had a PCN reaction causing severe rash involving mucus membranes or skin necrosis: Unknown Has patient had a PCN reaction  that required hospitalization: Unknown Has patient had a PCN reaction occurring within the last 10 years: Unknown If all of the above answers are "NO", then may proceed with Cephalosporin use.   Marland Kitchen Amoxicillin-Pot Clavulanate Nausea And Vomiting and Other (See Comments)    GI symptoms and UTI  . Reglan [Metoclopramide] Anxiety  . Sulfa Antibiotics Rash  . Sulfasalazine Rash    VITALS:  Blood pressure 121/68, pulse 86, temperature 98.2 F (36.8 C), temperature source Oral, resp. rate 20, height 5\' 7"  (1.702 m), weight (!) 145.1 kg, last menstrual period 05/25/2018, SpO2 97 %.  PHYSICAL EXAMINATION:  GENERAL:  37 y.o.-year-old patient lying in the bed with no acute distress.  EYES: Pupils equal, round, reactive to light and accommodation. No scleral icterus. Extraocular muscles intact.  HEENT: Head atraumatic, normocephalic. Oropharynx and nasopharynx clear.  NECK:  Supple, no jugular venous distention. No thyroid enlargement, no tenderness.  LUNGS: Normal breath sounds bilaterally, no wheezing, rales,rhonchi or crepitation. No use of accessory muscles of respiration.  CARDIOVASCULAR: S1, S2 normal. No murmurs, rubs, or gallops.  ABDOMEN: Soft, nontender, nondistended. Bowel sounds present. No organomegaly or mass.  EXTREMITIES: No pedal edema, cyanosis, or clubbing.  NEUROLOGIC: Cranial nerves II through XII are intact. Muscle strength 5/5 in all extremities. Sensation intact. Gait not checked.  PSYCHIATRIC: The patient is alert and oriented x 3.  SKIN: No obvious rash, lesion, or ulcer.   Physical Exam LABORATORY PANEL:   CBC Recent Labs  Lab 05/31/18 0821  WBC 10.8  HGB 9.7*  HCT 28.8*  PLT 375   ------------------------------------------------------------------------------------------------------------------  Chemistries  Recent Labs  Lab 06/01/18 0547  NA 139  K 4.2  CL 102  CO2 29  GLUCOSE 120*  BUN <5*  CREATININE 0.52  CALCIUM 8.8*  MG 1.7  AST 18  ALT 18   ALKPHOS 66  BILITOT 0.6   ------------------------------------------------------------------------------------------------------------------  Cardiac Enzymes No results for input(s): TROPONINI in the last 168 hours. ------------------------------------------------------------------------------------------------------------------  RADIOLOGY:  Ct Abdomen Pelvis Wo Contrast  Result Date: 05/31/2018 CLINICAL DATA:  Upper abdominal pain. EXAM: CT ABDOMEN AND PELVIS WITHOUT CONTRAST TECHNIQUE: Multidetector CT imaging of the abdomen and pelvis was performed following the standard protocol without IV contrast. COMPARISON:  CT scan of May 27, 2018. FINDINGS: Lower chest: No acute abnormality. Hepatobiliary: No focal liver abnormality is seen. No gallstones, gallbladder wall thickening, or biliary dilatation. Pancreas: Significantly increased inflammatory changes are seen involving the entire pancreas consistent with worsening pancreatitis. No pseudocyst formation is seen at this time. No ductal dilatation is noted. Spleen: Normal in size without focal abnormality. Adrenals/Urinary Tract: Adrenal glands are unremarkable. Small nonobstructive left renal calculus is noted. No hydronephrosis or renal obstruction is noted. Bladder is unremarkable. Stomach/Bowel: Stomach is within normal limits. Appendix appears normal. No evidence of bowel wall thickening, distention, or inflammatory changes. Vascular/Lymphatic: No significant vascular findings are present. No enlarged abdominal or pelvic lymph nodes. Reproductive: Uterus and bilateral adnexa are unremarkable. Other: No abdominal wall hernia or abnormality. No abdominopelvic ascites. Musculoskeletal: No acute or significant osseous findings. IMPRESSION: Significantly increased peripancreatic inflammatory changes consistent with worsening pancreatitis. No pseudocyst formation is noted. Small nonobstructive left renal calculus. No hydronephrosis or renal  obstruction is noted. Electronically Signed   By: Lupita Raider, M.D.   On: 05/31/2018 14:37   [place] Ekg Site Rite  Result Date: 05/31/2018 If Site Rite image not attached, placement could not be confirmed due to current cardiac rhythm.   ASSESSMENT AND PLAN:  Patient is a 37 year old presenting to the hospital with severe abdominal pain noted to have acute pancreatitis  *Acute pancreatitis  etiology unclear  CT suggest worsening pancreatitis a Gastroenterology input appreciated, advance to clear liquid diet, continue TPN for now, antiemetics, adult pain protocol   *Chronic depression  Stable on current regiment   *Acute leukocytosis  Resolved   Disposition Home in 1 to 2 days barring any complications  All the records are reviewed and case discussed with Care Management/Social Workerr. Management plans discussed with the patient, family and they are in agreement.  CODE STATUS: full  TOTAL TIME TAKING CARE OF THIS PATIENT: 35 minutes.     POSSIBLE D/C IN 1-2 DAYS, DEPENDING ON CLINICAL CONDITION.   Evelena Asa Salary M.D on 06/01/2018   Between 7am to 6pm - Pager - [phone]  After 6pm go to [email or web address] - password [email or web address]  [facility]  Office  [phone]  CC: Primary care physician; System, Pcp Not In  Note: This dictation was prepared with Dragon dictation along with smaller phrase technology. Any transcriptional errors that result from this process are unintentional.

## 2018-06-01 NOTE — Progress Notes (Signed)
Brief Nutrition Note  Consult received for parenteral nutrition. Central Access: Double Lumen PICC.   Pharmacy to initiate Clinimix 5%AA/15%Dextrose at 56ml/hr with MVI and Trace Minerals at this time.  Full assessment and further recommendations to follow Sunday  Admitting Dx: Acute pancreatitis, unspecified complication status, unspecified pancreatitis type [K85.90]  Labs:  Recent Labs  Lab 05/30/18 0304 05/31/18 0821 06/01/18 0547  NA 139 138 139  K 3.3* 4.1 4.2  CL 103 101 102  CO2 26 28 29   BUN 8 5* <5*  CREATININE 0.52 0.59 0.52  CALCIUM 8.6* 8.7* 8.8*  MG  --  1.7 1.7  PHOS  --  3.9 4.8*    Christophe Louis RD, LDN, CNSC Clinical Nutrition Available Tues-Sat via Pager: [phone] 06/01/2018 8:03 AM

## 2018-06-01 NOTE — Progress Notes (Signed)
Per Dr. Norma Fredrickson, patient to be strictly NPO. Patient educated and verbalized understanding. TPN to be initiated.   Pharmacy consulted to change PO meds to IV. Bo Mcclintock, RN

## 2018-06-01 NOTE — Progress Notes (Signed)
[facility] Gastroenterology Inpatient Progress Note  Subjective: Patient seen for follow-up pancreatitis.  Patient has continued abdominal pain which worsened yesterday after trying clear liquids by mouth.  Patient has been instructed to be on TPN and there appears to be some issue with altering dietary plans.  CT on 05/31/2018 showed continued inflammatory changes of the pancreas despite white blood cell count being upper limit of normal.  Objective: Vital signs in last 24 hours: Temp:  [98.2 F (36.8 C)-99 F (37.2 C)] 99 F (37.2 C) (09/28 1621) Pulse Rate:  [84-111] 101 (09/28 1621) Resp:  [18-22] 20 (09/28 1621) BP: (121-140)/(68-94) 130/94 (09/28 1621) SpO2:  [97 %-99 %] 97 % (09/28 1621) Weight:  [140.1 kg-145.1 kg] 145.1 kg (09/28 0453) Blood pressure (!) 130/94, pulse (!) 101, temperature 99 F (37.2 C), temperature source Oral, resp. rate 20, height 5\' 7"  (1.702 m), weight (!) 145.1 kg, last menstrual period 05/25/2018, SpO2 97 %.    Intake/Output from previous day: 09/27 0701 - 09/28 0700 In: 637.6 [P.O.:360; I.V.:277.6] Out: 1 [Urine:1]  Intake/Output this shift: Total I/O In: 774.2 [I.V.:774.2] Out: -    General appearance: Alert no acute distress mild discomfort Resp: Clear to auscultation Cardio: Regular rate no gallop GI: Soft with moderate epigastric tenderness to palpation.  No rebound or masses.  Bowel sounds positive Extremities: No edema.   Lab Results: Results for orders placed or performed during the hospital encounter of 05/27/18 (from the past 24 hour(s))  Basic metabolic panel     Status: Abnormal   Collection Time: 06/01/18  5:47 AM  Result Value Ref Range   Sodium 139 135 - 145 mmol/L   Potassium 4.2 3.5 - 5.1 mmol/L   Chloride 102 98 - 111 mmol/L   CO2 29 22 - 32 mmol/L   Glucose, Bld 120 (H) 70 - 99 mg/dL   BUN <5 (L) 6 - 20 mg/dL   Creatinine, Ser 1.61 0.44 - 1.00 mg/dL   Calcium 8.8 (L) 8.9 - 10.3 mg/dL   GFR calc non Af Amer >60  >60 mL/min   GFR calc Af Amer >60 >60 mL/min   Anion gap 8 5 - 15  Magnesium     Status: None   Collection Time: 06/01/18  5:47 AM  Result Value Ref Range   Magnesium 1.7 1.7 - 2.4 mg/dL  Phosphorus     Status: Abnormal   Collection Time: 06/01/18  5:47 AM  Result Value Ref Range   Phosphorus 4.8 (H) 2.5 - 4.6 mg/dL  Albumin     Status: Abnormal   Collection Time: 06/01/18  5:47 AM  Result Value Ref Range   Albumin 2.8 (L) 3.5 - 5.0 g/dL  Triglycerides     Status: None   Collection Time: 06/01/18  5:47 AM  Result Value Ref Range   Triglycerides 77 <150 mg/dL  Hepatic function panel     Status: Abnormal   Collection Time: 06/01/18  5:47 AM  Result Value Ref Range   Total Protein 6.4 (L) 6.5 - 8.1 g/dL   Albumin 2.8 (L) 3.5 - 5.0 g/dL   AST 18 15 - 41 U/L   ALT 18 0 - 44 U/L   Alkaline Phosphatase 66 38 - 126 U/L   Total Bilirubin 0.6 0.3 - 1.2 mg/dL   Bilirubin, Direct <0.9 0.0 - 0.2 mg/dL   Indirect Bilirubin NOT CALCULATED 0.3 - 0.9 mg/dL     Recent Labs    60/45/40 0304 05/31/18 0821  WBC 15.1*  10.8  HGB 9.8* 9.7*  HCT 27.9* 28.8*  PLT 329 375   BMET Recent Labs    05/30/18 0304 05/31/18 0821 06/01/18 0547  NA 139 138 139  K 3.3* 4.1 4.2  CL 103 101 102  CO2 26 28 29   GLUCOSE 115* 135* 120*  BUN 8 5* <5*  CREATININE 0.52 0.59 0.52  CALCIUM 8.6* 8.7* 8.8*   LFT Recent Labs    06/01/18 0547  PROT 6.4*  ALBUMIN 2.8*  2.8*  AST 18  ALT 18  ALKPHOS 66  BILITOT 0.6  BILIDIR <0.1  IBILI NOT CALCULATED   PT/INR No results for input(s): LABPROT, INR in the last 72 hours. Hepatitis Panel No results for input(s): HEPBSAG, HCVAB, HEPAIGM, HEPBIGM in the last 72 hours. C-Diff No results for input(s): CDIFFTOX in the last 72 hours. No results for input(s): CDIFFPCR in the last 72 hours.   Studies/Results: Ct Abdomen Pelvis Wo Contrast  Result Date: 05/31/2018 CLINICAL DATA:  Upper abdominal pain. EXAM: CT ABDOMEN AND PELVIS WITHOUT CONTRAST  TECHNIQUE: Multidetector CT imaging of the abdomen and pelvis was performed following the standard protocol without IV contrast. COMPARISON:  CT scan of May 27, 2018. FINDINGS: Lower chest: No acute abnormality. Hepatobiliary: No focal liver abnormality is seen. No gallstones, gallbladder wall thickening, or biliary dilatation. Pancreas: Significantly increased inflammatory changes are seen involving the entire pancreas consistent with worsening pancreatitis. No pseudocyst formation is seen at this time. No ductal dilatation is noted. Spleen: Normal in size without focal abnormality. Adrenals/Urinary Tract: Adrenal glands are unremarkable. Small nonobstructive left renal calculus is noted. No hydronephrosis or renal obstruction is noted. Bladder is unremarkable. Stomach/Bowel: Stomach is within normal limits. Appendix appears normal. No evidence of bowel wall thickening, distention, or inflammatory changes. Vascular/Lymphatic: No significant vascular findings are present. No enlarged abdominal or pelvic lymph nodes. Reproductive: Uterus and bilateral adnexa are unremarkable. Other: No abdominal wall hernia or abnormality. No abdominopelvic ascites. Musculoskeletal: No acute or significant osseous findings. IMPRESSION: Significantly increased peripancreatic inflammatory changes consistent with worsening pancreatitis. No pseudocyst formation is noted. Small nonobstructive left renal calculus. No hydronephrosis or renal obstruction is noted. Electronically Signed   By: Lupita Raider, M.D.   On: 05/31/2018 14:37   [place] Ekg Site Rite  Result Date: 05/31/2018 If Site Rite image not attached, placement could not be confirmed due to current cardiac rhythm.   Scheduled Inpatient Medications:   . docusate sodium  200 mg Oral BID  . enoxaparin (LOVENOX) injection  40 mg Subcutaneous Q12H  . FLUoxetine  80 mg Oral QHS  . insulin aspart  0-9 Units Subcutaneous Q6H  . montelukast  10 mg Oral QHS  .  polyethylene glycol  17 g Oral Daily  . sodium chloride  1 spray Each Nare QHS  . sodium chloride flush  10-40 mL Intracatheter Q12H    Continuous Inpatient Infusions:   . dextrose 5 % lactated ringers with kcl 50 mL/hr at 06/01/18 1534  . TPN (CLINIMIX) Adult without lytes      PRN Inpatient Medications:  acetaminophen **OR** acetaminophen, calcium carbonate, fluticasone, HYDROcodone-acetaminophen, HYDROmorphone (DILAUDID) injection, ibuprofen, meclizine, ondansetron **OR** ondansetron (ZOFRAN) IV, senna, sodium chloride flush, zolpidem  Miscellaneous:   Assessment:  Acute pancreatitis without known cause.-Patient on TPN.  Clear liquids have been tried on the patient which have been unsuccessful given the patient still has pancreatitis on CT scan  Plan:  1.  Strict n.p.o.   2.  Continue TPN. 3.  Follow  CBC, will order today as one has not been drawn.  Teodoro K. Norma Fredrickson, M.D. 06/01/2018, 4:44 PM

## 2018-06-02 LAB — BASIC METABOLIC PANEL
Anion gap: 10 (ref 5–15)
BUN: 5 mg/dL — AB (ref 6–20)
CALCIUM: 8.7 mg/dL — AB (ref 8.9–10.3)
CO2: 27 mmol/L (ref 22–32)
Chloride: 100 mmol/L (ref 98–111)
Creatinine, Ser: 0.58 mg/dL (ref 0.44–1.00)
GFR calc Af Amer: >60 mL/min (ref >60)
GLUCOSE: 120 mg/dL — AB (ref 70–99)
Potassium: 4 mmol/L (ref 3.5–5.1)
Sodium: 137 mmol/L (ref 135–145)

## 2018-06-02 LAB — PHOSPHORUS: Phosphorus: 4.6 mg/dL (ref 2.5–4.6)

## 2018-06-02 LAB — GLUCOSE, CAPILLARY
GLUCOSE-CAPILLARY: 102 mg/dL — AB (ref 70–99)
GLUCOSE-CAPILLARY: 106 mg/dL — AB (ref 70–99)
GLUCOSE-CAPILLARY: 121 mg/dL — AB (ref 70–99)
Glucose-Capillary: 103 mg/dL — ABNORMAL HIGH (ref 70–99)
Glucose-Capillary: 135 mg/dL — ABNORMAL HIGH (ref 70–99)

## 2018-06-02 LAB — TRIGLYCERIDES: Triglycerides: 105 mg/dL (ref <150)

## 2018-06-02 LAB — MAGNESIUM: Magnesium: 1.9 mg/dL (ref 1.7–2.4)

## 2018-06-02 MED ORDER — FAT EMULSION PLANT BASED 20 % IV EMUL
250.0000 mL | INTRAVENOUS | Status: AC
  Administered 2018-06-02: 19:00:00 250 mL via INTRAVENOUS
  Filled 2018-06-02: qty 250

## 2018-06-02 MED ORDER — TRACE MINERALS CR-CU-MN-SE-ZN 10-1000-500-60 MCG/ML IV SOLN
INTRAVENOUS | Status: AC
  Administered 2018-06-02: 19:00:00 via INTRAVENOUS
  Filled 2018-06-02: qty 1992

## 2018-06-02 NOTE — Progress Notes (Signed)
PHARMACY - ADULT TOTAL PARENTERAL NUTRITION CONSULT NOTE   Pharmacy Consult for TPN Indication: severe pancreatitis  Patient Measurements: Height: 5\' 7"  (170.2 cm) Weight: (!) 312 lb (141.5 kg) IBW/kg (Calculated) : 61.6 TPN AdjBW (KG): 80.7 Body mass index is 48.87 kg/m.   Assessment:  37 yo female here with pancreatitis. Per MD note CT suggest worsening pancreatitis at this point need to make patient complete n.p.o. and give bowel rest.   Endo: no history of diabetes Insulin requirements in the past 24 hours: none Lytes: K 4.0, Mg 1.9, Phos: 4.6  Renal:  SCr 0.58, CrCl ~140 ml/min  TPN Access: DL PICC placed 1/61/09 60:45 TPN start date: 06/01/18 Nutritional Goals :  2233 kcal (97% estimated needs), 100 grams of protein (80% estimated needs), and 2232 mL fluid daily including lipids. Current Nutrition:  NPO  Plan:  Transition to Clinimix E 5/20 at 83 mL/hr + 20% ILE at 20 mL/hr over 12 hours per RD  adult MVI and trace elements as daily TPN additives   No additional electrolyte replacement needed this AM.   SSI sensitive Q6H ordered.  Daily weights and strict in/outs  Pharmacy will continue to follow.   Maverick Patman M Kasiah Manka, Pharm.D., BCPS Clinical Pharmacist 06/02/2018,1:45 PM

## 2018-06-02 NOTE — Progress Notes (Signed)
Initial Nutrition Assessment  DOCUMENTATION CODES:   Morbid obesity  INTERVENTION:  Initiate goal TPN regimen of Clinimix E 5/20 at 83 mL/hr + 20% ILE at 20 mL/hr over 12 hours. Provides 2233 kcal (97% estimated needs), 100 grams of protein (80% estimated needs), and 2232 mL fluid daily including lipids.  Provide adult MVI and trace elements as daily TPN additives.  NUTRITION DIAGNOSIS:   Inadequate oral intake related to acute illness(acute pancreatitis) as evidenced by NPO status, per patient/family report.  GOAL:   Patient will meet greater than or equal to 90% of their needs  MONITOR:   Diet advancement, Labs, Weight trends, I & O's  REASON FOR ASSESSMENT:   Consult New TPN/TNA  ASSESSMENT:   37 year old female with PMHx of anxiety, depression, migraines, fibromyalgia, asthma, arthritis who presented with severe abdominal pain found to have acute pancreatitis.   -CT abdomen/pelvis 9/27 found significantly increased peripancreatic inflammatory changes consistent with worsening pancreatitis. -On 9/26 diet was advanced to clear liquids but patient did not tolerated and she was made NPO again.  Met with patient at bedside. She reports onset of abdominal pain and nausea on 9/20. She was still eating PTA but was experiencing post-prandial pain and discomfort. She reports she has been NPO here except for trying clear liquids on 9/26, which she did not tolerate. TPN was initiated yesterday due to length of time without tolerating diet. Patient reports she was eating well PTA. She drinks [employer] products. She is lactose intolerant. She has never had pancreatitis before but reports a history of gastritis and colitis. Patient has not had a documented BM this admission.  Patient reports she is weight-stable. Per chart she has been around 133.4-135.4 kg. On admission she was 137.9 kg.  Patient likely with only mild/moderate acute pancreatitis as her APACHE II score was </=9, Ranson  criteria was </=2, and CT scan does not show any necrosis. Usually diet is able to be tolerated within 7 days in 81% of patients with mild/moderate acute pancreatitis. As patient has not yet been able to tolerate diet advancement agree with initiating TPN. Could have also considered small-bore feeding tube for initiation of tube feeds, but as we are past the 48 hour window, may not have been tolerated very well.  IV Access: right cephalic double lumen PICC placed 9/27; ECG technology used to confirm PICC in SVC  TPN: last night patient was started on Clinimix 5/15 without electrolytes at 40 mL/hr  Medications reviewed and include: Novolog 0-9 units Q6hrs. Patient ordered for several PO medications but unable to take at this time.  Labs reviewed: CBG 99-121 since initiation of TPN last night, BUN 5, Triglycerides 105. Potassium, Phosphorus, and Magnesium are WNL. Lipase was 35 on 9/24.  I/O: UOP unmeasured  Weight trend: 141.5 kg on 9/29; +3.6 kg from admission weight  Patient does not meet criteria for malnutrition at this time.  Discussed with MD.  NUTRITION - FOCUSED PHYSICAL EXAM:    Most Recent Value  Orbital Region  No depletion  Upper Arm Region  No depletion  Thoracic and Lumbar Region  No depletion  Buccal Region  No depletion  Temple Region  No depletion  Clavicle Bone Region  No depletion  Clavicle and Acromion Bone Region  No depletion  Scapular Bone Region  No depletion  Dorsal Hand  No depletion  Patellar Region  No depletion  Anterior Thigh Region  No depletion  Posterior Calf Region  No depletion  Edema (RD Assessment)  None  Hair  Reviewed  Eyes  Reviewed  Mouth  Reviewed  Skin  Reviewed  Nails  Reviewed     Diet Order:   Diet Order            Diet NPO time specified Except for: Other (See Comments)  Diet effective now              EDUCATION NEEDS:   No education needs have been identified at this time  Skin:  Skin Assessment: Reviewed RN  Assessment  Last BM:  05/27/2018  Height:   Ht Readings from Last 1 Encounters:  05/27/18 '5\' 7"'  (1.702 m)    Weight:   Wt Readings from Last 1 Encounters:  06/02/18 (!) 141.5 kg    Ideal Body Weight:  61.4 kg  BMI:  Body mass index is 48.87 kg/m.  Estimated Nutritional Needs:   Kcal:  9243-8365 (MSJ x 1.1-1.2)  Protein:  125-140 grams (0.9-1 grams/kg actual body weight; >2 grams/kg IBW)  Fluid:  1.8-2.1 L/day (30-35 mL/kg IBW)  Willey Blade, MS, RD, LDN Office: [phone] Pager: [phone] After Hours/Weekend Pager: [phone]

## 2018-06-02 NOTE — Progress Notes (Signed)
[employer] [facility] [place] [facility]   PATIENT NAME: Kelsey Bell    MR#:  [id number]  DATE OF BIRTH:  07/21/1981  SUBJECTIVE:  CHIEF COMPLAINT:   Chief Complaint  Patient presents with  . Abdominal Pain  . Nausea  Patient feeling better, ambulating without difficulty pain is reduced, gastroneurology input appreciated  REVIEW OF SYSTEMS:  CONSTITUTIONAL: No fever, fatigue or weakness.  EYES: No blurred or double vision.  EARS, NOSE, AND THROAT: No tinnitus or ear pain.  RESPIRATORY: No cough, shortness of breath, wheezing or hemoptysis.  CARDIOVASCULAR: No chest pain, orthopnea, edema.  GASTROINTESTINAL: No nausea, vomiting, diarrhea or abdominal pain.  GENITOURINARY: No dysuria, hematuria.  ENDOCRINE: No polyuria, nocturia,  HEMATOLOGY: No anemia, easy bruising or bleeding SKIN: No rash or lesion. MUSCULOSKELETAL: No joint pain or arthritis.   NEUROLOGIC: No tingling, numbness, weakness.  PSYCHIATRY: No anxiety or depression.   ROS  DRUG ALLERGIES:   Allergies  Allergen Reactions  . Cefdinir Hives  . Esomeprazole Anaphylaxis  . Esomeprazole Magnesium Anaphylaxis  . Gabapentin Swelling  . Hydroxychloroquine Rash and Shortness Of Breath  . Tapentadol Other (See Comments)    Other reaction(s): Other (See Comments) Body aches and pains, and itching. Swelling.   HEADACHES Body aches and pains, and itching. Swelling. Body aches and pains, and itching. Swelling.  . Nsaids Nausea Only and Other (See Comments)    Other: acid reflux  . Sumatriptan Succinate Other (See Comments)  . Cortisone Other (See Comments)    pain pain  . Duloxetine Other (See Comments)    Hot flashed, sweating  . Prednisone Other (See Comments)    Muscle spasms  . Amoxicillin Rash    Has patient had a PCN reaction causing immediate rash, facial/tongue/throat swelling, SOB or lightheadedness with hypotension: Unknown Has patient had a PCN reaction causing severe rash involving  mucus membranes or skin necrosis: Unknown Has patient had a PCN reaction that required hospitalization: Unknown Has patient had a PCN reaction occurring within the last 10 years: Unknown If all of the above answers are "NO", then may proceed with Cephalosporin use.   Marland Kitchen Amoxicillin-Pot Clavulanate Nausea And Vomiting and Other (See Comments)    GI symptoms and UTI  . Reglan [Metoclopramide] Anxiety  . Sulfa Antibiotics Rash  . Sulfasalazine Rash    VITALS:  Blood pressure 124/70, pulse 79, temperature 98.1 F (36.7 C), temperature source Oral, resp. rate 18, height 5\' 7"  (1.702 m), weight (!) 141.5 kg, last menstrual period 05/25/2018, SpO2 97 %.  PHYSICAL EXAMINATION:  GENERAL:  37 y.o.-year-old patient lying in the bed with no acute distress.  EYES: Pupils equal, round, reactive to light and accommodation. No scleral icterus. Extraocular muscles intact.  HEENT: Head atraumatic, normocephalic. Oropharynx and nasopharynx clear.  NECK:  Supple, no jugular venous distention. No thyroid enlargement, no tenderness.  LUNGS: Normal breath sounds bilaterally, no wheezing, rales,rhonchi or crepitation. No use of accessory muscles of respiration.  CARDIOVASCULAR: S1, S2 normal. No murmurs, rubs, or gallops.  ABDOMEN: Soft, nontender, nondistended. Bowel sounds present. No organomegaly or mass.  EXTREMITIES: No pedal edema, cyanosis, or clubbing.  NEUROLOGIC: Cranial nerves II through XII are intact. Muscle strength 5/5 in all extremities. Sensation intact. Gait not checked.  PSYCHIATRIC: The patient is alert and oriented x 3.  SKIN: No obvious rash, lesion, or ulcer.   Physical Exam LABORATORY PANEL:   CBC Recent Labs  Lab 06/01/18 1900  WBC 9.9  HGB 11.1*  HCT 30.5*  PLT 367   ------------------------------------------------------------------------------------------------------------------  Chemistries  Recent Labs  Lab 06/01/18 0547 06/02/18 0509  NA 139 137  K 4.2 4.0  CL  102 100  CO2 29 27  GLUCOSE 120* 120*  BUN <5* 5*  CREATININE 0.52 0.58  CALCIUM 8.8* 8.7*  MG 1.7 1.9  AST 18  --   ALT 18  --   ALKPHOS 66  --   BILITOT 0.6  --    ------------------------------------------------------------------------------------------------------------------  Cardiac Enzymes No results for input(s): TROPONINI in the last 168 hours. ------------------------------------------------------------------------------------------------------------------  RADIOLOGY:  Ct Abdomen Pelvis Wo Contrast  Result Date: 05/31/2018 CLINICAL DATA:  Upper abdominal pain. EXAM: CT ABDOMEN AND PELVIS WITHOUT CONTRAST TECHNIQUE: Multidetector CT imaging of the abdomen and pelvis was performed following the standard protocol without IV contrast. COMPARISON:  CT scan of May 27, 2018. FINDINGS: Lower chest: No acute abnormality. Hepatobiliary: No focal liver abnormality is seen. No gallstones, gallbladder wall thickening, or biliary dilatation. Pancreas: Significantly increased inflammatory changes are seen involving the entire pancreas consistent with worsening pancreatitis. No pseudocyst formation is seen at this time. No ductal dilatation is noted. Spleen: Normal in size without focal abnormality. Adrenals/Urinary Tract: Adrenal glands are unremarkable. Small nonobstructive left renal calculus is noted. No hydronephrosis or renal obstruction is noted. Bladder is unremarkable. Stomach/Bowel: Stomach is within normal limits. Appendix appears normal. No evidence of bowel wall thickening, distention, or inflammatory changes. Vascular/Lymphatic: No significant vascular findings are present. No enlarged abdominal or pelvic lymph nodes. Reproductive: Uterus and bilateral adnexa are unremarkable. Other: No abdominal wall hernia or abnormality. No abdominopelvic ascites. Musculoskeletal: No acute or significant osseous findings. IMPRESSION: Significantly increased peripancreatic inflammatory changes  consistent with worsening pancreatitis. No pseudocyst formation is noted. Small nonobstructive left renal calculus. No hydronephrosis or renal obstruction is noted. Electronically Signed   By: Lupita Raider, M.D.   On: 05/31/2018 14:37   [place] Ekg Site Rite  Result Date: 05/31/2018 If Site Rite image not attached, placement could not be confirmed due to current cardiac rhythm.   ASSESSMENT AND PLAN:  Patient is a 37 year old presenting to the hospital with severe abdominal pain noted to have acute pancreatitis  *Acute pancreatitis  Resolving etiology unclear  CT suggest worsening pancreatitis  Gastroenterology input appreciated-recommended continue n.p.o., TPN, adult pain protocol   *Chronic depression  Stable on current regiment   *Acute leukocytosis  Resolved   Disposition Home in 1 to 2 days barring any complications once cleared by gastroenterology    All the records are reviewed and case discussed with Care Management/Social Workerr. Management plans discussed with the patient, family and they are in agreement.  CODE STATUS: full  TOTAL TIME TAKING CARE OF THIS PATIENT: 35 minutes.     POSSIBLE D/C IN 1-3 DAYS, DEPENDING ON CLINICAL CONDITION.   Evelena Asa Phuc Kluttz M.D on 06/02/2018   Between 7am to 6pm - Pager - [phone]  After 6pm go to [email or web address] - password [email or web address]  [facility] [place] [facility]  Office  [phone]  CC: Primary care physician; System, Pcp Not In  Note: This dictation was prepared with Dragon dictation along with smaller phrase technology. Any transcriptional errors that result from this process are unintentional.

## 2018-06-02 NOTE — Progress Notes (Signed)
    Arlyss Repress, MD [address]  [address]  [place], [place] [address]  Main: [phone]  Fax: [phone] Pager: [phone]   Subjective: She reports feeling somewhat better today, epigastric pain is improving, continues to experience nausea.  Does not feel hungry.   Objective: Vital signs in last 24 hours: Vitals:   06/01/18 1951 06/02/18 0330 06/02/18 0500 06/02/18 1313  BP: 113/75 124/70  130/85  Pulse: 83 79  85  Resp: 19 18  20   Temp: 98.1 F (36.7 C) 98.1 F (36.7 C)  99.5 F (37.5 C)  TempSrc: Oral Oral  Oral  SpO2: 96% 97%  96%  Weight:   (!) 141.5 kg   Height:       Weight change: -3.357 kg  Intake/Output Summary (Last 24 hours) at 06/02/2018 1729 Last data filed at 06/02/2018 0500 Gross per 24 hour  Intake 801.37 ml  Output -  Net 801.37 ml     Exam: Heart:: Regular rate and rhythm or S1S2 present Lungs: normal and clear to auscultation Abdomen: soft, nontender, normal bowel sounds   Lab Results: CBC Latest Ref Rng & Units 06/01/2018 05/31/2018 05/30/2018  WBC 3.6 - 11.0 K/uL 9.9 10.8 15.1(H)  Hemoglobin 12.0 - 16.0 g/dL 11.1(L) 9.7(L) 9.8(L)  Hematocrit 35.0 - 47.0 % 30.5(L) 28.8(L) 27.9(L)  Platelets 150 - 440 K/uL 367 375 329   Micro Results: No results found for this or any previous visit (from the past 240 hour(s)). Studies/Results: No results found. Medications: I have reviewed the patient's current medications. Scheduled Meds: . docusate sodium  200 mg Oral BID  . enoxaparin (LOVENOX) injection  40 mg Subcutaneous Q12H  . FLUoxetine  80 mg Oral QHS  . insulin aspart  0-9 Units Subcutaneous Q6H  . montelukast  10 mg Oral QHS  . polyethylene glycol  17 g Oral Daily  . sodium chloride  1 spray Each Nare QHS  . sodium chloride flush  10-40 mL Intracatheter Q12H   Continuous Infusions: . Marland KitchenTPN (CLINIMIX-E) Adult     And  . Fat emulsion    . TPN (CLINIMIX) Adult without lytes 40 mL/hr at 06/01/18 1846   PRN  Meds:.acetaminophen **OR** acetaminophen, calcium carbonate, fluticasone, HYDROcodone-acetaminophen, HYDROmorphone (DILAUDID) injection, ibuprofen, meclizine, ondansetron **OR** ondansetron (ZOFRAN) IV, senna, sodium chloride flush, zolpidem   Assessment: Active Problems:   Acute pancreatitis  On TPN  Clinically improving  Plan: Continue pain management, antiemetics N.p.o. Today We will try ice chips/clear liquids tomorrow if symptoms continue to improve She will benefit from reimaging pancreas or EUS as outpatient after resolution of acute pancreatitis to evaluate for autoimmune pancreatitis  Dr Maximino Greenland to cover from tomorrow   LOS: 6 days   Rohini Vanga 06/02/2018, 5:29 PM

## 2018-06-03 LAB — BASIC METABOLIC PANEL
Anion gap: 11 (ref 5–15)
BUN: 12 mg/dL (ref 6–20)
CHLORIDE: 100 mmol/L (ref 98–111)
CO2: 24 mmol/L (ref 22–32)
CREATININE: 0.56 mg/dL (ref 0.44–1.00)
Calcium: 8.7 mg/dL — ABNORMAL LOW (ref 8.9–10.3)
GFR calc Af Amer: >60 mL/min (ref >60)
GFR calc non Af Amer: >60 mL/min (ref >60)
Glucose, Bld: 121 mg/dL — ABNORMAL HIGH (ref 70–99)
Potassium: 4.4 mmol/L (ref 3.5–5.1)
SODIUM: 135 mmol/L (ref 135–145)

## 2018-06-03 LAB — PHOSPHORUS: Phosphorus: 4.3 mg/dL (ref 2.5–4.6)

## 2018-06-03 LAB — GLUCOSE, CAPILLARY
GLUCOSE-CAPILLARY: 129 mg/dL — AB (ref 70–99)
Glucose-Capillary: 130 mg/dL — ABNORMAL HIGH (ref 70–99)
Glucose-Capillary: 132 mg/dL — ABNORMAL HIGH (ref 70–99)

## 2018-06-03 LAB — MAGNESIUM: MAGNESIUM: 2 mg/dL (ref 1.7–2.4)

## 2018-06-03 LAB — TRIGLYCERIDES: Triglycerides: 132 mg/dL (ref <150)

## 2018-06-03 MED ORDER — TRACE MINERALS CR-CU-MN-SE-ZN 10-1000-500-60 MCG/ML IV SOLN
INTRAVENOUS | Status: AC
  Administered 2018-06-03: 18:00:00 via INTRAVENOUS
  Filled 2018-06-03: qty 1992

## 2018-06-03 MED ORDER — FAT EMULSION PLANT BASED 20 % IV EMUL
250.0000 mL | INTRAVENOUS | Status: AC
  Administered 2018-06-03: 18:00:00 250 mL via INTRAVENOUS
  Filled 2018-06-03: qty 250

## 2018-06-03 NOTE — Progress Notes (Signed)
   Melodie Bouillon, MD [address], [place], [place], [address] [address], [place], [place], [address] Phone: [phone]  Fax: [phone]   Subjective: Patient reports improvement in abdominal pain.  No nausea or vomiting.  Dull pain, nonradiating, 5/10.   Objective: Exam: Vital signs in last 24 hours: Vitals:   06/02/18 1313 06/02/18 1946 06/03/18 0542 06/03/18 1003  BP: 130/85 (!) 145/85 138/68   Pulse: 85 83 83   Resp: 20 12 12    Temp: 99.5 F (37.5 C) 99.1 F (37.3 C) 98.5 F (36.9 C)   TempSrc: Oral Oral Oral   SpO2: 96% 94% 95%   Weight:    (!) 141.1 kg  Height:       Weight change:   Intake/Output Summary (Last 24 hours) at 06/03/2018 1115 Last data filed at 06/03/2018 1009 Gross per 24 hour  Intake 1399.59 ml  Output -  Net 1399.59 ml    General: No acute distress, AAO x3 Abd: Soft, NT/ND, No HSM Skin: Warm, no rashes Neck: Supple, Trachea midline   Lab Results: Lab Results  Component Value Date   WBC 9.9 06/01/2018   HGB 11.1 (L) 06/01/2018   HCT 30.5 (L) 06/01/2018   MCV 90.5 06/01/2018   PLT 367 06/01/2018   Micro Results: No results found for this or any previous visit (from the past 240 hour(s)). Studies/Results: No results found. Medications:  Scheduled Meds: . docusate sodium  200 mg Oral BID  . enoxaparin (LOVENOX) injection  40 mg Subcutaneous Q12H  . FLUoxetine  80 mg Oral QHS  . insulin aspart  0-9 Units Subcutaneous Q6H  . montelukast  10 mg Oral QHS  . polyethylene glycol  17 g Oral Daily  . sodium chloride  1 spray Each Nare QHS  . sodium chloride flush  10-40 mL Intracatheter Q12H   Continuous Infusions: . Marland KitchenTPN (CLINIMIX-E) Adult 83 mL/hr at 06/03/18 1009   PRN Meds:.acetaminophen **OR** acetaminophen, calcium carbonate, fluticasone, HYDROcodone-acetaminophen, HYDROmorphone (DILAUDID) injection, ibuprofen, meclizine, ondansetron **OR** ondansetron (ZOFRAN) IV, senna, sodium chloride flush,  zolpidem   Assessment: Active Problems:   Acute pancreatitis    Plan: Abdominal pain improving Patient on TPN She is afraid of eating as she is afraid of getting abdominal pain Would recommend starting clear liquid diet for dinner tonight or tomorrow if patient continues to improve Continue pain management as needed   LOS: 7 days   Melodie Bouillon, MD 06/03/2018, 11:15 AM

## 2018-06-03 NOTE — Progress Notes (Signed)
[facility] [place] [facility]   PATIENT NAME: Kelsey Bell    MR#:  [id number]  DATE OF BIRTH:  November 05, 1980  SUBJECTIVE:  CHIEF COMPLAINT:   Chief Complaint  Patient presents with  . Abdominal Pain  . Nausea  Patient feeling better, ambulating without difficulty pain is reduced, gastroneurology input noted REVIEW OF SYSTEMS:  CONSTITUTIONAL: No fever, fatigue or weakness.  EYES: No blurred or double vision.  EARS, NOSE, AND THROAT: No tinnitus or ear pain.  RESPIRATORY: No cough, shortness of breath, wheezing or hemoptysis.  CARDIOVASCULAR: No chest pain, orthopnea, edema.  GASTROINTESTINAL: No nausea, vomiting, diarrhea or abdominal pain.  GENITOURINARY: No dysuria, hematuria.  ENDOCRINE: No polyuria, nocturia,  HEMATOLOGY: No anemia, easy bruising or bleeding SKIN: No rash or lesion. MUSCULOSKELETAL: No joint pain or arthritis.   NEUROLOGIC: No tingling, numbness, weakness.  PSYCHIATRY: No anxiety or depression.   ROS  DRUG ALLERGIES:   Allergies  Allergen Reactions  . Cefdinir Hives  . Esomeprazole Anaphylaxis  . Esomeprazole Magnesium Anaphylaxis  . Gabapentin Swelling  . Hydroxychloroquine Rash and Shortness Of Breath  . Tapentadol Other (See Comments)    Other reaction(s): Other (See Comments) Body aches and pains, and itching. Swelling.   HEADACHES Body aches and pains, and itching. Swelling. Body aches and pains, and itching. Swelling.  . Nsaids Nausea Only and Other (See Comments)    Other: acid reflux  . Sumatriptan Succinate Other (See Comments)  . Cortisone Other (See Comments)    pain pain  . Duloxetine Other (See Comments)    Hot flashed, sweating  . Prednisone Other (See Comments)    Muscle spasms  . Amoxicillin Rash    Has patient had a PCN reaction causing immediate rash, facial/tongue/throat swelling, SOB or lightheadedness with hypotension: Unknown Has patient had a PCN reaction causing severe rash involving mucus  membranes or skin necrosis: Unknown Has patient had a PCN reaction that required hospitalization: Unknown Has patient had a PCN reaction occurring within the last 10 years: Unknown If all of the above answers are "NO", then may proceed with Cephalosporin use.   Marland Kitchen Amoxicillin-Pot Clavulanate Nausea And Vomiting and Other (See Comments)    GI symptoms and UTI  . Reglan [Metoclopramide] Anxiety  . Sulfa Antibiotics Rash  . Sulfasalazine Rash    VITALS:  Blood pressure 138/68, pulse 83, temperature 98.5 F (36.9 C), temperature source Oral, resp. rate 12, height 5\' 7"  (1.702 m), weight (!) 141.1 kg, last menstrual period 05/25/2018, SpO2 95 %.  PHYSICAL EXAMINATION:  GENERAL:  37 y.o.-year-old patient lying in the bed with no acute distress.  EYES: Pupils equal, round, reactive to light and accommodation. No scleral icterus. Extraocular muscles intact.  HEENT: Head atraumatic, normocephalic. Oropharynx and nasopharynx clear.  NECK:  Supple, no jugular venous distention. No thyroid enlargement, no tenderness.  LUNGS: Normal breath sounds bilaterally, no wheezing, rales,rhonchi or crepitation. No use of accessory muscles of respiration.  CARDIOVASCULAR: S1, S2 normal. No murmurs, rubs, or gallops.  ABDOMEN: Soft, nontender, nondistended. Bowel sounds present. No organomegaly or mass.  EXTREMITIES: No pedal edema, cyanosis, or clubbing.  NEUROLOGIC: Cranial nerves II through XII are intact. Muscle strength 5/5 in all extremities. Sensation intact. Gait not checked.  PSYCHIATRIC: The patient is alert and oriented x 3.  SKIN: No obvious rash, lesion, or ulcer.   Physical Exam LABORATORY PANEL:   CBC Recent Labs  Lab 06/01/18 1900  WBC 9.9  HGB 11.1*  HCT 30.5*  PLT  367   ------------------------------------------------------------------------------------------------------------------  Chemistries  Recent Labs  Lab 06/01/18 0547  06/03/18 0736  NA 139   < > 135  K 4.2   < > 4.4   CL 102   < > 100  CO2 29   < > 24  GLUCOSE 120*   < > 121*  BUN <5*   < > 12  CREATININE 0.52   < > 0.56  CALCIUM 8.8*   < > 8.7*  MG 1.7   < > 2.0  AST 18  --   --   ALT 18  --   --   ALKPHOS 66  --   --   BILITOT 0.6  --   --    < > = values in this interval not displayed.   ------------------------------------------------------------------------------------------------------------------  Cardiac Enzymes No results for input(s): TROPONINI in the last 168 hours. ------------------------------------------------------------------------------------------------------------------  RADIOLOGY:  No results found.  ASSESSMENT AND PLAN:  Patient is a 37 year old presenting to the hospital with severe abdominal pain noted to have acute pancreatitis  *Acute pancreatitis  Resolving etiology unclear  CT suggest worsening pancreatitis  Diet per gastroenterology, continue TPN, adult pain protocol   *Chronic depression  Stable on current regiment   *Acute leukocytosis  Resolved   Disposition Home once cleared by gastroenterology  All the records are reviewed and case discussed with Care Management/Social Workerr. Management plans discussed with the patient, family and they are in agreement.  CODE STATUS: full  TOTAL TIME TAKING CARE OF THIS PATIENT: 35 minutes.     POSSIBLE D/C IN 1-3 DAYS, DEPENDING ON CLINICAL CONDITION.   Evelena Asa Salary M.D on 06/03/2018   Between 7am to 6pm - Pager - [phone]  After 6pm go to [email or web address] - password [email or web address]  [facility]  Office  [phone]  CC: Primary care physician; System, Pcp Not In  Note: This dictation was prepared with Dragon dictation along with smaller phrase technology. Any transcriptional errors that result from this process are unintentional.

## 2018-06-03 NOTE — Progress Notes (Signed)
PHARMACY - ADULT TOTAL PARENTERAL NUTRITION CONSULT NOTE   Pharmacy Consult for TPN Indication: severe pancreatitis  Patient Measurements: Height: 5\' 7"  (170.2 cm) Weight: (!) 311 lb (141.1 kg) IBW/kg (Calculated) : 61.6 TPN AdjBW (KG): 80.7 Body mass index is 48.71 kg/m.   Assessment:  37 yo female here with pancreatitis. Per MD note CT suggest worsening pancreatitis at this point need to make patient complete n.p.o. and give bowel rest.   Endo: no history of diabetes Insulin requirements in the past 24 hours: none Lytes: K 4.4, Mg 2.0, Phos: 4.3 Renal:  SCr 0.56, CrCl ~140 ml/min  TPN Access: DL PICC placed 12/12/79 19:14 TPN start date: 06/01/18 Nutritional Goals :  2233 kcal (97% estimated needs), 100 grams of protein (80% estimated needs), and 2232 mL fluid daily including lipids. Current Nutrition:  NPO  Plan:  Maintain Clinimix E 5/20 at 83 mL/hr + 20% ILE at 20 mL/hr over 12 hours per RD  adult MVI and trace elements as daily TPN additives   No additional electrolyte replacement needed this AM.   SSI sensitive Q6H ordered.  Daily weights and strict in/outs  Pharmacy will continue to follow.   Lowella Bandy, Pharm.D. Clinical Pharmacist 06/03/2018,12:55 PM

## 2018-06-04 ENCOUNTER — Ambulatory Visit: Payer: Self-pay | Admitting: Physical Medicine & Rehabilitation

## 2018-06-04 DIAGNOSIS — I82409 Acute embolism and thrombosis of unspecified deep veins of unspecified lower extremity: Secondary | ICD-10-CM | POA: Insufficient documentation

## 2018-06-04 HISTORY — DX: Acute embolism and thrombosis of unspecified deep veins of unspecified lower extremity: I82.409

## 2018-06-04 LAB — BASIC METABOLIC PANEL
ANION GAP: 8 (ref 5–15)
BUN: 15 mg/dL (ref 6–20)
CO2: 27 mmol/L (ref 22–32)
Calcium: 8.6 mg/dL — ABNORMAL LOW (ref 8.9–10.3)
Chloride: 100 mmol/L (ref 98–111)
Creatinine, Ser: 0.66 mg/dL (ref 0.44–1.00)
Glucose, Bld: 134 mg/dL — ABNORMAL HIGH (ref 70–99)
Potassium: 4 mmol/L (ref 3.5–5.1)
SODIUM: 135 mmol/L (ref 135–145)

## 2018-06-04 LAB — GLUCOSE, CAPILLARY
GLUCOSE-CAPILLARY: 124 mg/dL — AB (ref 70–99)
GLUCOSE-CAPILLARY: 127 mg/dL — AB (ref 70–99)
GLUCOSE-CAPILLARY: 144 mg/dL — AB (ref 70–99)
Glucose-Capillary: 125 mg/dL — ABNORMAL HIGH (ref 70–99)
Glucose-Capillary: 137 mg/dL — ABNORMAL HIGH (ref 70–99)

## 2018-06-04 LAB — MAGNESIUM: Magnesium: 2 mg/dL (ref 1.7–2.4)

## 2018-06-04 LAB — PHOSPHORUS: Phosphorus: 4.7 mg/dL — ABNORMAL HIGH (ref 2.5–4.6)

## 2018-06-04 LAB — ANA W/REFLEX IF POSITIVE: ANA: NEGATIVE

## 2018-06-04 MED ORDER — FAT EMULSION PLANT BASED 20 % IV EMUL
250.0000 mL | INTRAVENOUS | Status: AC
  Filled 2018-06-04: qty 250

## 2018-06-04 MED ORDER — INFLUENZA VAC SPLIT QUAD 0.5 ML IM SUSY: 0.5000 mL | PREFILLED_SYRINGE | INTRAMUSCULAR | Status: DC

## 2018-06-04 MED ORDER — TRACE MINERALS CR-CU-MN-SE-ZN 10-1000-500-60 MCG/ML IV SOLN
INTRAVENOUS | Status: AC
  Administered 2018-06-04: 18:00:00 via INTRAVENOUS
  Filled 2018-06-04: qty 1992

## 2018-06-04 NOTE — Progress Notes (Signed)
[facility] [place] [facility]   PATIENT NAME: Kelsey Bell    MR#:  [id number]  DATE OF BIRTH:  Nov 10, 1980  SUBJECTIVE:  CHIEF COMPLAINT:   Chief Complaint  Patient presents with  . Abdominal Pain  . Nausea  Patient feeling better, continues to complain of abdominal pain, await further gastroenterology recommendations  REVIEW OF SYSTEMS:  CONSTITUTIONAL: No fever, fatigue or weakness.  EYES: No blurred or double vision.  EARS, NOSE, AND THROAT: No tinnitus or ear pain.  RESPIRATORY: No cough, shortness of breath, wheezing or hemoptysis.  CARDIOVASCULAR: No chest pain, orthopnea, edema.  GASTROINTESTINAL: No nausea, vomiting, diarrhea or abdominal pain.  GENITOURINARY: No dysuria, hematuria.  ENDOCRINE: No polyuria, nocturia,  HEMATOLOGY: No anemia, easy bruising or bleeding SKIN: No rash or lesion. MUSCULOSKELETAL: No joint pain or arthritis.   NEUROLOGIC: No tingling, numbness, weakness.  PSYCHIATRY: No anxiety or depression.   ROS  DRUG ALLERGIES:   Allergies  Allergen Reactions  . Cefdinir Hives  . Esomeprazole Anaphylaxis  . Esomeprazole Magnesium Anaphylaxis  . Gabapentin Swelling  . Hydroxychloroquine Rash and Shortness Of Breath  . Tapentadol Other (See Comments)    Other reaction(s): Other (See Comments) Body aches and pains, and itching. Swelling.   HEADACHES Body aches and pains, and itching. Swelling. Body aches and pains, and itching. Swelling.  . Nsaids Nausea Only and Other (See Comments)    Other: acid reflux  . Sumatriptan Succinate Other (See Comments)  . Cortisone Other (See Comments)    pain pain  . Duloxetine Other (See Comments)    Hot flashed, sweating  . Prednisone Other (See Comments)    Muscle spasms  . Amoxicillin Rash    Has patient had a PCN reaction causing immediate rash, facial/tongue/throat swelling, SOB or lightheadedness with hypotension: Unknown Has patient had a PCN reaction causing severe rash  involving mucus membranes or skin necrosis: Unknown Has patient had a PCN reaction that required hospitalization: Unknown Has patient had a PCN reaction occurring within the last 10 years: Unknown If all of the above answers are "NO", then may proceed with Cephalosporin use.   Marland Kitchen Amoxicillin-Pot Clavulanate Nausea And Vomiting and Other (See Comments)    GI symptoms and UTI  . Reglan [Metoclopramide] Anxiety  . Sulfa Antibiotics Rash  . Sulfasalazine Rash    VITALS:  Blood pressure 123/79, pulse 85, temperature 98.3 F (36.8 C), temperature source Oral, resp. rate 18, height 5\' 7"  (1.702 m), weight (!) 141.1 kg, last menstrual period 05/25/2018, SpO2 97 %.  PHYSICAL EXAMINATION:  GENERAL:  37 y.o.-year-old patient lying in the bed with no acute distress.  EYES: Pupils equal, round, reactive to light and accommodation. No scleral icterus. Extraocular muscles intact.  HEENT: Head atraumatic, normocephalic. Oropharynx and nasopharynx clear.  NECK:  Supple, no jugular venous distention. No thyroid enlargement, no tenderness.  LUNGS: Normal breath sounds bilaterally, no wheezing, rales,rhonchi or crepitation. No use of accessory muscles of respiration.  CARDIOVASCULAR: S1, S2 normal. No murmurs, rubs, or gallops.  ABDOMEN: Soft, nontender, nondistended. Bowel sounds present. No organomegaly or mass.  EXTREMITIES: No pedal edema, cyanosis, or clubbing.  NEUROLOGIC: Cranial nerves II through XII are intact. Muscle strength 5/5 in all extremities. Sensation intact. Gait not checked.  PSYCHIATRIC: The patient is alert and oriented x 3.  SKIN: No obvious rash, lesion, or ulcer.   Physical Exam LABORATORY PANEL:   CBC Recent Labs  Lab 06/01/18 1900  WBC 9.9  HGB 11.1*  HCT 30.5*  PLT 367   ------------------------------------------------------------------------------------------------------------------  Chemistries  Recent Labs  Lab 06/01/18 0547  06/04/18 0523  NA 139   < > 135   K 4.2   < > 4.0  CL 102   < > 100  CO2 29   < > 27  GLUCOSE 120*   < > 134*  BUN <5*   < > 15  CREATININE 0.52   < > 0.66  CALCIUM 8.8*   < > 8.6*  MG 1.7   < > 2.0  AST 18  --   --   ALT 18  --   --   ALKPHOS 66  --   --   BILITOT 0.6  --   --    < > = values in this interval not displayed.   ------------------------------------------------------------------------------------------------------------------  Cardiac Enzymes No results for input(s): TROPONINI in the last 168 hours. ------------------------------------------------------------------------------------------------------------------  RADIOLOGY:  No results found.  ASSESSMENT AND PLAN:  Patient is a 37 year old presenting to the hospital with severe abdominal pain noted to have acute pancreatitis  *Acute pancreatitis  Resolving etiology unclear  CT suggest worsening pancreatitis  Diet per gastroenterology, continue TPN, adult pain protocol   *Chronic depression  Stable on current regiment   *Acute leukocytosis  Resolved   Disposition Home once cleared by gastroenterology  All the records are reviewed and case discussed with Care Management/Social Workerr. Management plans discussed with the patient, family and they are in agreement.  CODE STATUS: full  TOTAL TIME TAKING CARE OF THIS PATIENT: 35 minutes.     POSSIBLE D/C IN 1-3 DAYS, DEPENDING ON CLINICAL CONDITION.   Kelsey Bell M.D on 06/04/2018   Between 7am to 6pm - Pager - [phone]  After 6pm go to [email or web address] - password [email or web address]  [facility] [place] [facility]  Office  [phone]  CC: Primary care physician; System, Pcp Not In  Note: This dictation was prepared with Dragon dictation along with smaller phrase technology. Any transcriptional errors that result from this process are unintentional.

## 2018-06-04 NOTE — Progress Notes (Signed)
Patient's weight is down 10 pounds from yesterday however patient's home pillows are not on the bed.  Orson Ape, RN, BSN

## 2018-06-04 NOTE — Progress Notes (Signed)
   Kelsey Bouillon, MD [address], [place], [place], [address] [address], [place], [place], [address] Phone: [phone]  Fax: [phone]   Subjective: Patient clinically improving.  Abdominal pain better.  No nausea or vomiting.    Objective: Exam: Vital signs in last 24 hours: Vitals:   06/03/18 2035 06/04/18 0358 06/04/18 0947 06/04/18 1335  BP: 133/65 123/79  122/79  Pulse: 92 85  88  Resp: 18 18  20   Temp: 99.3 F (37.4 C) 98.3 F (36.8 C)  98.3 F (36.8 C)  TempSrc: Oral Oral  Oral  SpO2: 97% 97%  98%  Weight:   (!) 136.5 kg   Height:       Weight change:   Intake/Output Summary (Last 24 hours) at 06/04/2018 1523 Last data filed at 06/04/2018 0756 Gross per 24 hour  Intake 2035.91 ml  Output -  Net 2035.91 ml    General: No acute distress, AAO x3 Abd: Soft, NT/ND, No HSM Skin: Warm, no rashes Neck: Supple, Trachea midline   Lab Results: Lab Results  Component Value Date   WBC 9.9 06/01/2018   HGB 11.1 (L) 06/01/2018   HCT 30.5 (L) 06/01/2018   MCV 90.5 06/01/2018   PLT 367 06/01/2018   Micro Results: No results found for this or any previous visit (from the past 240 hour(s)). Studies/Results: No results found. Medications:  Scheduled Meds: . docusate sodium  200 mg Oral BID  . enoxaparin (LOVENOX) injection  40 mg Subcutaneous Q12H  . FLUoxetine  80 mg Oral QHS  . insulin aspart  0-9 Units Subcutaneous Q6H  . montelukast  10 mg Oral QHS  . polyethylene glycol  17 g Oral Daily  . sodium chloride  1 spray Each Nare QHS  . sodium chloride flush  10-40 mL Intracatheter Q12H   Continuous Infusions: . Marland KitchenTPN (CLINIMIX-E) Adult     And  . Fat emulsion    . Marland KitchenTPN (CLINIMIX-E) Adult 83 mL/hr at 06/04/18 0756   PRN Meds:.acetaminophen **OR** acetaminophen, calcium carbonate, fluticasone, HYDROcodone-acetaminophen, HYDROmorphone (DILAUDID) injection, ibuprofen, meclizine, ondansetron **OR** ondansetron (ZOFRAN) IV, senna,  sodium chloride flush, zolpidem   Assessment: Active Problems:   Acute pancreatitis    Plan: Patient clinically improving clear liquid diet today Advance as tolerated to low-fat diet tomorrow if patient does well with clear liquid diet today Continue daily abdominal exams Liver enzymes have remained normal throughout her hospital stay Patient should follow-up as an outpatient in GI clinic on discharge, at which time repeat imaging, versus EUS can be discussed.   LOS: 8 days   Kelsey Bouillon, MD 06/04/2018, 3:23 PM

## 2018-06-04 NOTE — Progress Notes (Signed)
PHARMACY - ADULT TOTAL PARENTERAL NUTRITION CONSULT NOTE   Pharmacy Consult for TPN Indication: severe pancreatitis  Patient Measurements: Height: 5\' 7"  (170.2 cm) Weight: (!) 311 lb (141.1 kg) IBW/kg (Calculated) : 61.6 TPN AdjBW (KG): 80.7 Body mass index is 48.71 kg/m.   Assessment:  37 yo female here with pancreatitis. Patient is clinically much better but is afraid to eat  Endo: no history of diabetes Insulin requirements in the past 24 hours: none Lytes: K 4.0, Mg 2.0, Phos: 4.7 Renal:  SCr 0.66, CrCl ~140 ml/min  TPN Access: DL PICC placed 04/14/90 47:82 TPN start date: 06/01/18 Nutritional Goals :  2233 kcal (97% estimated needs), 100 grams of protein (80% estimated needs), and 2232 mL fluid daily including lipids. Current Nutrition:  NPO  Plan:  Maintain Clinimix E 5/20 at 83 mL/hr + 20% ILE at 20 mL/hr over 12 hours per RD  adult MVI and trace elements as daily TPN additives   No additional electrolyte replacement needed this AM.   SSI sensitive Q6H ordered.  Daily weights and strict in/outs  Pharmacy will continue to follow.   Lowella Bandy, Pharm.D. Clinical Pharmacist 06/04/2018,11:30 AM

## 2018-06-05 LAB — GLUCOSE, CAPILLARY
Glucose-Capillary: 133 mg/dL — ABNORMAL HIGH (ref 70–99)
Glucose-Capillary: 161 mg/dL — ABNORMAL HIGH (ref 70–99)
Glucose-Capillary: 139 mg/dL — ABNORMAL HIGH (ref 70–99)

## 2018-06-05 MED ORDER — LORAZEPAM 2 MG/ML IJ SOLN
0.5000 mg | Freq: Once | INTRAMUSCULAR | Status: AC
  Administered 2018-06-06: 0.5 mg via INTRAVENOUS
  Filled 2018-06-05: qty 1

## 2018-06-05 MED ORDER — FAT EMULSION PLANT BASED 20 % IV EMUL
250.0000 mL | INTRAVENOUS | Status: DC
  Filled 2018-06-05: qty 250

## 2018-06-05 MED ORDER — TRACE MINERALS CR-CU-MN-SE-ZN 10-1000-500-60 MCG/ML IV SOLN
INTRAVENOUS | Status: DC
  Administered 2018-06-05: 19:00:00 via INTRAVENOUS
  Filled 2018-06-05: qty 1992

## 2018-06-05 NOTE — Progress Notes (Signed)
   Melodie Bouillon, MD [address], [place], [place], [address] [address], [address], [place], [place], [address] Phone: [phone]  Fax: [phone]   Subjective: Pt doing well on clear liquid diet.  Abdominal pain continues to improve   Objective: Exam: Vital signs in last 24 hours: Vitals:   06/04/18 1939 06/05/18 0410 06/05/18 0524 06/05/18 1017  BP: 120/70 114/70    Pulse: 93 81    Resp: 20 18    Temp: 99.8 F (37.7 C) 98.8 F (37.1 C)  97.9 F (36.6 C)  TempSrc: Oral Oral  Oral  SpO2: 96% 94%    Weight:   135.7 kg   Height:       Weight change: -4.536 kg  Intake/Output Summary (Last 24 hours) at 06/05/2018 1022 Last data filed at 06/05/2018 0321 Gross per 24 hour  Intake 1585.47 ml  Output -  Net 1585.47 ml    General: No acute distress, AAO x3 Abd: Soft, NT/ND, No HSM Skin: Warm, no rashes Neck: Supple, Trachea midline   Lab Results: Lab Results  Component Value Date   WBC 9.9 06/01/2018   HGB 11.1 (L) 06/01/2018   HCT 30.5 (L) 06/01/2018   MCV 90.5 06/01/2018   PLT 367 06/01/2018   Micro Results: No results found for this or any previous visit (from the past 240 hour(s)). Studies/Results: No results found. Medications:  Scheduled Meds: . docusate sodium  200 mg Oral BID  . enoxaparin (LOVENOX) injection  40 mg Subcutaneous Q12H  . FLUoxetine  80 mg Oral QHS  . Influenza vac split quadrivalent PF  0.5 mL Intramuscular Tomorrow-1000  . insulin aspart  0-9 Units Subcutaneous Q6H  . montelukast  10 mg Oral QHS  . polyethylene glycol  17 g Oral Daily  . sodium chloride  1 spray Each Nare QHS  . sodium chloride flush  10-40 mL Intracatheter Q12H   Continuous Infusions: . Marland KitchenTPN (CLINIMIX-E) Adult 83 mL/hr at 06/05/18 0321   PRN Meds:.acetaminophen **OR** acetaminophen, calcium carbonate, fluticasone, HYDROcodone-acetaminophen, HYDROmorphone (DILAUDID) injection, ibuprofen, meclizine, ondansetron **OR** ondansetron (ZOFRAN) IV,  senna, sodium chloride flush, zolpidem   Assessment: Active Problems:   Acute pancreatitis    Plan: Advance diet to low-fat diet today Continue daily abdominal exams and monitoring clinical symptoms Patient is to follow-up in GI clinic as an outpatient once she is discharged No indication for repeat CT scan at this time If abdominal pain worsens can repeat CT scan as an inpatient, otherwise expectant management as per above    LOS: 9 days   Melodie Bouillon, MD 06/05/2018, 10:22 AM

## 2018-06-05 NOTE — Progress Notes (Signed)
PHARMACY - ADULT TOTAL PARENTERAL NUTRITION CONSULT NOTE   Pharmacy Consult for TPN Indication: severe pancreatitis  Patient Measurements: Height: 5\' 7"  (170.2 cm) Weight: 299 lb 3.2 oz (135.7 kg) IBW/kg (Calculated) : 61.6 TPN AdjBW (KG): 80.7 Body mass index is 46.86 kg/m.   Assessment:  37 yo female here with pancreatitis. Patient is clinically much better but is afraid to eat  Endo: no history of diabetes Insulin requirements in the past 24 hours: none Lytes: K 4.0, Mg 2.0, Phos: 4.7 Renal:  SCr 0.66, CrCl ~140 ml/min  TPN Access: DL PICC placed 1/91/47 82:95 TPN start date: 06/01/18 Nutritional Goals :  2233 kcal (97% estimated needs), 100 grams of protein (80% estimated needs), and 2232 mL fluid daily including lipids. Current Nutrition:  CLD  Plan:  Maintain Clinimix E 5/20 at 83 mL/hr + 20% ILE at 20 mL/hr over 12 hours per RD  adult MVI and trace elements as daily TPN additives   No additional electrolyte replacement needed this AM.   SSI sensitive Q6H ordered.  Daily weights and strict in/outs  Pharmacy will continue to follow.   Lowella Bandy, Pharm.D. Clinical Pharmacist 06/05/2018,1:15 PM

## 2018-06-05 NOTE — Progress Notes (Signed)
[facility] [place] at [facility]   PATIENT NAME: Kelsey Bell    MR#:  [id number]  DATE OF BIRTH:  12/21/1980  SUBJECTIVE:  CHIEF COMPLAINT:   Chief Complaint  Patient presents with  . Abdominal Pain  . Nausea  Patient continues to complain of moderate abdominal pain, not drinking/eating much REVIEW OF SYSTEMS:  CONSTITUTIONAL: No fever, fatigue or weakness.  EYES: No blurred or double vision.  EARS, NOSE, AND THROAT: No tinnitus or ear pain.  RESPIRATORY: No cough, shortness of breath, wheezing or hemoptysis.  CARDIOVASCULAR: No chest pain, orthopnea, edema.  GASTROINTESTINAL: No nausea, vomiting, diarrhea or abdominal pain.  GENITOURINARY: No dysuria, hematuria.  ENDOCRINE: No polyuria, nocturia,  HEMATOLOGY: No anemia, easy bruising or bleeding SKIN: No rash or lesion. MUSCULOSKELETAL: No joint pain or arthritis.   NEUROLOGIC: No tingling, numbness, weakness.  PSYCHIATRY: No anxiety or depression.   ROS  DRUG ALLERGIES:   Allergies  Allergen Reactions  . Cefdinir Hives  . Esomeprazole Anaphylaxis  . Esomeprazole Magnesium Anaphylaxis  . Gabapentin Swelling  . Hydroxychloroquine Rash and Shortness Of Breath  . Tapentadol Other (See Comments)    Other reaction(s): Other (See Comments) Body aches and pains, and itching. Swelling.   HEADACHES Body aches and pains, and itching. Swelling. Body aches and pains, and itching. Swelling.  . Nsaids Nausea Only and Other (See Comments)    Other: acid reflux  . Sumatriptan Succinate Other (See Comments)  . Cortisone Other (See Comments)    pain pain  . Duloxetine Other (See Comments)    Hot flashed, sweating  . Prednisone Other (See Comments)    Muscle spasms  . Amoxicillin Rash    Has patient had a PCN reaction causing immediate rash, facial/tongue/throat swelling, SOB or lightheadedness with hypotension: Unknown Has patient had a PCN reaction causing severe rash involving mucus membranes or skin  necrosis: Unknown Has patient had a PCN reaction that required hospitalization: Unknown Has patient had a PCN reaction occurring within the last 10 years: Unknown If all of the above answers are "NO", then may proceed with Cephalosporin use.   Marland Kitchen Amoxicillin-Pot Clavulanate Nausea And Vomiting and Other (See Comments)    GI symptoms and UTI  . Reglan [Metoclopramide] Anxiety  . Sulfa Antibiotics Rash  . Sulfasalazine Rash    VITALS:  Blood pressure 114/70, pulse 81, temperature 97.9 F (36.6 C), temperature source Oral, resp. rate 18, height 5\' 7"  (1.702 m), weight 135.7 kg, last menstrual period 05/25/2018, SpO2 94 %.  PHYSICAL EXAMINATION:  GENERAL:  37 y.o.-year-old patient lying in the bed with no acute distress.  EYES: Pupils equal, round, reactive to light and accommodation. No scleral icterus. Extraocular muscles intact.  HEENT: Head atraumatic, normocephalic. Oropharynx and nasopharynx clear.  NECK:  Supple, no jugular venous distention. No thyroid enlargement, no tenderness.  LUNGS: Normal breath sounds bilaterally, no wheezing, rales,rhonchi or crepitation. No use of accessory muscles of respiration.  CARDIOVASCULAR: S1, S2 normal. No murmurs, rubs, or gallops.  ABDOMEN: Soft, nontender, nondistended. Bowel sounds present. No organomegaly or mass.  EXTREMITIES: No pedal edema, cyanosis, or clubbing.  NEUROLOGIC: Cranial nerves II through XII are intact. Muscle strength 5/5 in all extremities. Sensation intact. Gait not checked.  PSYCHIATRIC: The patient is alert and oriented x 3.  SKIN: No obvious rash, lesion, or ulcer.   Physical Exam LABORATORY PANEL:   CBC Recent Labs  Lab 06/01/18 1900  WBC 9.9  HGB 11.1*  HCT 30.5*  PLT 367   ------------------------------------------------------------------------------------------------------------------  Chemistries  Recent Labs  Lab 06/01/18 0547  06/04/18 0523  NA 139   < > 135  K 4.2   < > 4.0  CL 102   < > 100   CO2 29   < > 27  GLUCOSE 120*   < > 134*  BUN <5*   < > 15  CREATININE 0.52   < > 0.66  CALCIUM 8.8*   < > 8.6*  MG 1.7   < > 2.0  AST 18  --   --   ALT 18  --   --   ALKPHOS 66  --   --   BILITOT 0.6  --   --    < > = values in this interval not displayed.   ------------------------------------------------------------------------------------------------------------------  Cardiac Enzymes No results for input(s): TROPONINI in the last 168 hours. ------------------------------------------------------------------------------------------------------------------  RADIOLOGY:  No results found.  ASSESSMENT AND PLAN:  Patient is a 37 year old presenting to the hospital with severe abdominal pain noted to have acute pancreatitis  *Acute pancreatitis  Resolving etiology unclear  CT suggest worsening pancreatitis  Diet per gastroenterology, continue TPN, adult pain protocol   *Chronic depression  Stable on current regiment   *Acute leukocytosis  Resolved   Disposition Home once cleared by gastroenterology  All the records are reviewed and case discussed with Care Management/Social Workerr. Management plans discussed with the patient, family and they are in agreement.  CODE STATUS: full  TOTAL TIME TAKING CARE OF THIS PATIENT: 35 minutes.     POSSIBLE D/C IN 1-3 DAYS, DEPENDING ON CLINICAL CONDITION.   Evelena Asa [place] Welcome M.D on 06/05/2018   Between 7am to 6pm - Pager - [phone]  After 6pm go to [email or web address] - password [email or web address]  [facility] [place]  Office  [phone]  CC: Primary care physician; System, Pcp Not In  Note: This dictation was prepared with Dragon dictation along with smaller phrase technology. Any transcriptional errors that result from this process are unintentional.

## 2018-06-06 ENCOUNTER — Inpatient Hospital Stay: Payer: Self-pay

## 2018-06-06 LAB — HEPATIC FUNCTION PANEL
ALBUMIN: 3.2 g/dL — AB (ref 3.5–5.0)
ALT: 52 U/L — ABNORMAL HIGH (ref 0–44)
AST: 46 U/L — ABNORMAL HIGH (ref 15–41)
Alkaline Phosphatase: 66 U/L (ref 38–126)
Bilirubin, Direct: <0.1 mg/dL (ref 0.0–0.2)
TOTAL PROTEIN: 6.9 g/dL (ref 6.5–8.1)
Total Bilirubin: 0.5 mg/dL (ref 0.3–1.2)

## 2018-06-06 LAB — BASIC METABOLIC PANEL
ANION GAP: 5 (ref 5–15)
BUN: 14 mg/dL (ref 6–20)
CALCIUM: 8.6 mg/dL — AB (ref 8.9–10.3)
CO2: 26 mmol/L (ref 22–32)
Chloride: 105 mmol/L (ref 98–111)
Creatinine, Ser: 0.56 mg/dL (ref 0.44–1.00)
Glucose, Bld: 170 mg/dL — ABNORMAL HIGH (ref 70–99)
POTASSIUM: 4 mmol/L (ref 3.5–5.1)
SODIUM: 136 mmol/L (ref 135–145)

## 2018-06-06 LAB — GLUCOSE, CAPILLARY
Glucose-Capillary: 118 mg/dL — ABNORMAL HIGH (ref 70–99)
Glucose-Capillary: 131 mg/dL — ABNORMAL HIGH (ref 70–99)
Glucose-Capillary: 132 mg/dL — ABNORMAL HIGH (ref 70–99)

## 2018-06-06 LAB — CBC
HCT: 32.3 % — ABNORMAL LOW (ref 35.0–47.0)
HEMOGLOBIN: 11.1 g/dL — AB (ref 12.0–16.0)
MCH: 31 pg (ref 26.0–34.0)
MCHC: 34.4 g/dL (ref 32.0–36.0)
MCV: 90 fL (ref 80.0–100.0)
Platelets: 327 10*3/uL (ref 150–440)
RBC: 3.59 MIL/uL — AB (ref 3.80–5.20)
RDW: 13.6 % (ref 11.5–14.5)
WBC: 13.5 10*3/uL — AB (ref 3.6–11.0)

## 2018-06-06 LAB — PHOSPHORUS: PHOSPHORUS: 3.5 mg/dL (ref 2.5–4.6)

## 2018-06-06 LAB — TRIGLYCERIDES: TRIGLYCERIDES: 118 mg/dL (ref <150)

## 2018-06-06 LAB — MAGNESIUM: MAGNESIUM: 2.2 mg/dL (ref 1.7–2.4)

## 2018-06-06 MED ORDER — HYDROCODONE-ACETAMINOPHEN 5-325 MG PO TABS: 1.0000 | ORAL_TABLET | Freq: Four times a day (QID) | ORAL | 0 refills | Status: DC | PRN

## 2018-06-06 MED ORDER — ENSURE ENLIVE PO LIQD: 237.0000 mL | Freq: Two times a day (BID) | ORAL | Status: DC

## 2018-06-06 MED ORDER — ENSURE ENLIVE PO LIQD: 237.0000 mL | Freq: Two times a day (BID) | ORAL | 12 refills | Status: DC

## 2018-06-06 MED ORDER — GADOBENATE DIMEGLUMINE 529 MG/ML IV SOLN
20.0000 mL | Freq: Once | INTRAVENOUS | Status: AC | PRN
  Administered 2018-06-06: 02:00:00 20 mL via INTRAVENOUS

## 2018-06-06 MED ORDER — HYDROMORPHONE HCL 1 MG/ML IJ SOLN: 2.0000 mg | Freq: Three times a day (TID) | INTRAMUSCULAR | Status: DC | PRN

## 2018-06-06 NOTE — Discharge Summary (Addendum)
[facility] [place] at [facility]   PATIENT NAME: Kelsey Bell    MR#:  [id number]  DATE OF BIRTH:  04/05/1981  DATE OF ADMISSION:  05/27/2018 ADMITTING PHYSICIAN: Auburn Bilberry, MD  DATE OF DISCHARGE: 06/06/2018  PRIMARY CARE PHYSICIAN: Titus Mould, NP    ADMISSION DIAGNOSIS:  Acute pancreatitis, unspecified complication status, unspecified pancreatitis type [K85.90]  DISCHARGE DIAGNOSIS:  Acute pancreatitis--improving  SECONDARY DIAGNOSIS:   Past Medical History:  Diagnosis Date  . Allergy   . Anxiety   . Arthritis   . Asthma   . Breast pain present for several months   Bil LT >RT across of breasts  . CRPS (complex regional pain syndrome type I)   . Depression   . Kidney stones   . LGSIL on Pap smear of cervix 2007  . Migraine   . Neuromuscular disorder (HCC)    Complex regional pain syndrome and Fibromyalgia    HOSPITAL COURSE:  37 year old presenting to the hospital with severe abdominal pain noted to have acute pancreatitis  *Acute pancreatitis improving etiology unclear  CT abdomen on admisison suggest worsening pancreatitis. MR abdomen shows mild pancreatitis  Diet per gastroenterology -pt was on IV  TPN--- now d/ced since tolerating low fat diet recommended by GI -OK from GI stand point to d/c since now able to tolerate po diet -prn pain meds percotet #20 given  *Chronic depression  Stable on current regimen  * leukocytosis  Resolved   Disposition Home  Later today  D/c picc line--d/w RN   CONSULTS OBTAINED:  Treatment Team:  [name], MD  DRUG ALLERGIES:   Allergies  Allergen Reactions  . Cefdinir Hives  . Esomeprazole Anaphylaxis  . Esomeprazole Magnesium Anaphylaxis  . Gabapentin Swelling  . Hydroxychloroquine Rash and Shortness Of Breath  . Tapentadol Other (See Comments)    Other reaction(s): Other (See Comments) Body aches and pains, and itching. Swelling.   HEADACHES Body aches and  pains, and itching. Swelling. Body aches and pains, and itching. Swelling.  . Nsaids Nausea Only and Other (See Comments)    Other: acid reflux  . Sumatriptan Succinate Other (See Comments)  . Cortisone Other (See Comments)    pain pain  . Duloxetine Other (See Comments)    Hot flashed, sweating  . Prednisone Other (See Comments)    Muscle spasms  . Amoxicillin Rash    Has patient had a PCN reaction causing immediate rash, facial/tongue/throat swelling, SOB or lightheadedness with hypotension: Unknown Has patient had a PCN reaction causing severe rash involving mucus membranes or skin necrosis: Unknown Has patient had a PCN reaction that required hospitalization: Unknown Has patient had a PCN reaction occurring within the last 10 years: Unknown If all of the above answers are "NO", then may proceed with Cephalosporin use.   Marland Kitchen Amoxicillin-Pot Clavulanate Nausea And Vomiting and Other (See Comments)    GI symptoms and UTI  . Reglan [Metoclopramide] Anxiety  . Sulfa Antibiotics Rash  . Sulfasalazine Rash    DISCHARGE MEDICATIONS:   Allergies as of 06/06/2018      Reactions   Cefdinir Hives   Esomeprazole Anaphylaxis   Esomeprazole Magnesium Anaphylaxis   Gabapentin Swelling   Hydroxychloroquine Rash, Shortness Of Breath   Tapentadol Other (See Comments)   Other reaction(s): Other (See Comments) Body aches and pains, and itching. Swelling.   HEADACHES Body aches and pains, and itching. Swelling. Body aches and pains, and itching. Swelling.   Nsaids Nausea Only, Other (See  Comments)   Other: acid reflux   Sumatriptan Succinate Other (See Comments)   Cortisone Other (See Comments)   pain pain   Duloxetine Other (See Comments)   Hot flashed, sweating   Prednisone Other (See Comments)   Muscle spasms   Amoxicillin Rash   Has patient had a PCN reaction causing immediate rash, facial/tongue/throat swelling, SOB or lightheadedness with hypotension: Unknown Has patient had a  PCN reaction causing severe rash involving mucus membranes or skin necrosis: Unknown Has patient had a PCN reaction that required hospitalization: Unknown Has patient had a PCN reaction occurring within the last 10 years: Unknown If all of the above answers are "NO", then may proceed with Cephalosporin use.   Amoxicillin-pot Clavulanate Nausea And Vomiting, Other (See Comments)   GI symptoms and UTI   Reglan [metoclopramide] Anxiety   Sulfa Antibiotics Rash   Sulfasalazine Rash      Medication List    STOP taking these medications   celecoxib 200 MG capsule Commonly known as:  CELEBREX   Diclofenac Sodium 2 % Soln   Ibuprofen-Famotidine 800-26.6 MG Tabs   Lidocaine-Menthol 4-4 % Ptch     TAKE these medications   albuterol 108 (90 Base) MCG/ACT inhaler Commonly known as:  PROVENTIL HFA;VENTOLIN HFA Inhale 1 puff into the lungs as needed for wheezing or shortness of breath.   ALPRAZolam 0.25 MG tablet Commonly known as:  XANAX Take 0.25 mg by mouth as needed for anxiety.   budesonide-formoterol 160-4.5 MCG/ACT inhaler Commonly known as:  SYMBICORT Inhale 2 puffs into the lungs 2 (two) times daily. Using as needed   calcium carbonate 600 MG Tabs tablet Commonly known as:  OS-CAL Take 600 mg by mouth daily.   feeding supplement (ENSURE ENLIVE) Liqd Take 237 mLs by mouth 2 (two) times daily between meals.   FLUoxetine 40 MG capsule Commonly known as:  PROZAC Take 80 mg by mouth at bedtime.   fluticasone 50 MCG/ACT nasal spray Commonly known as:  FLONASE Place 2 sprays into the nose daily.   furosemide 20 MG tablet Commonly known as:  LASIX Take 20 mg by mouth daily.   HYDROcodone-acetaminophen 5-325 MG tablet Commonly known as:  NORCO/VICODIN Take 1-2 tablets by mouth every 6 (six) hours as needed for severe pain.   meloxicam 15 MG tablet Commonly known as:  MOBIC Take 15 mg by mouth daily.   montelukast 10 MG tablet Commonly known as:  SINGULAIR Take 10  mg by mouth at bedtime.   MULTI-VITAMINS Tabs Take 1 tablet by mouth daily.   ondansetron 8 MG tablet Commonly known as:  ZOFRAN Take 1 tablet (8 mg total) by mouth every 8 (eight) hours as needed for nausea or vomiting.   promethazine 12.5 MG tablet Commonly known as:  PHENERGAN Take 12.5-25 mg by mouth every 6 (six) hours as needed for nausea or vomiting.   ranitidine 75 MG tablet Commonly known as:  ZANTAC Take 75 mg by mouth daily as needed for heartburn.   sodium chloride 0.65 % Soln nasal spray Commonly known as:  OCEAN Place 1 spray into both nostrils at bedtime.   tamsulosin 0.4 MG Caps capsule Commonly known as:  FLOMAX Take 1 capsule (0.4 mg total) by mouth daily.   Vitamin D3 5000 units Tabs Take 10,000 Units by mouth daily.       If you experience worsening of your admission symptoms, develop shortness of breath, life threatening emergency, suicidal or homicidal thoughts you must seek medical attention immediately  by calling 911 or calling your MD immediately  if symptoms less severe.  You Must read complete instructions/literature along with all the possible adverse reactions/side effects for all the Medicines you take and that have been prescribed to you. Take any new Medicines after you have completely understood and accept all the possible adverse reactions/side effects.   Please note  You were cared for by a hospitalist during your hospital stay. If you have any questions about your discharge medications or the care you received while you were in the hospital after you are discharged, you can call the unit and asked to speak with the hospitalist on call if the hospitalist that took care of you is not available. Once you are discharged, your primary care physician will handle any further medical issues. Please note that NO REFILLS for any discharge medications will be authorized once you are discharged, as it is imperative that you return to your primary care  physician (or establish a relationship with a primary care physician if you do not have one) for your aftercare needs so that they can reassess your need for medications and monitor your lab values. Today   SUBJECTIVE   Feels better today  VITAL SIGNS:  Blood pressure 130/70, pulse 90, temperature 98.4 F (36.9 C), temperature source Oral, resp. rate 19, height 5\' 7"  (1.702 m), weight 135.7 kg, last menstrual period 05/25/2018, SpO2 98 %.  I/O:    Intake/Output Summary (Last 24 hours) at 06/06/2018 1139 Last data filed at 06/06/2018 0837 Gross per 24 hour  Intake 961.32 ml  Output -  Net 961.32 ml    PHYSICAL EXAMINATION:  GENERAL:  37 y.o.-year-old patient lying in the bed with no acute distress.morbidly Obese  EYES: Pupils equal, round, reactive to light and accommodation. No scleral icterus. Extraocular muscles intact.  HEENT: Head atraumatic, normocephalic. Oropharynx and nasopharynx clear.  NECK:  Supple, no jugular venous distention. No thyroid enlargement, no tenderness.  LUNGS: Normal breath sounds bilaterally, no wheezing, rales,rhonchi or crepitation. No use of accessory muscles of respiration.  CARDIOVASCULAR: S1, S2 normal. No murmurs, rubs, or gallops.  ABDOMEN: Soft, non-tender, non-distended. Bowel sounds present. No organomegaly or mass.  EXTREMITIES: No pedal edema, cyanosis, or clubbing.  NEUROLOGIC: Cranial nerves II through XII are intact. Muscle strength 5/5 in all extremities. Sensation intact. Gait not checked.  PSYCHIATRIC: The patient is alert and oriented x 3.  SKIN: No obvious rash, lesion, or ulcer.   DATA REVIEW:   CBC  Recent Labs  Lab 06/06/18 0524  WBC 13.5*  HGB 11.1*  HCT 32.3*  PLT 327    Chemistries  Recent Labs  Lab 06/06/18 0524  NA 136  K 4.0  CL 105  CO2 26  GLUCOSE 170*  BUN 14  CREATININE 0.56  CALCIUM 8.6*  MG 2.2  AST 46*  ALT 52*  ALKPHOS 66  BILITOT 0.5    Microbiology Results   No results found for this  or any previous visit (from the past 240 hour(s)).  RADIOLOGY:  Mr Abdomen Mrcp W Wo Contast  Result Date: 06/06/2018 CLINICAL DATA:  Follow-up pancreatitis EXAM: MRI ABDOMEN WITHOUT AND WITH CONTRAST (INCLUDING MRCP) TECHNIQUE: Multiplanar multisequence MR imaging of the abdomen was performed both before and after the administration of intravenous contrast. Heavily T2-weighted images of the biliary and pancreatic ducts were obtained, and three-dimensional MRCP images were rendered by post processing. CONTRAST:  20mL MULTIHANCE GADOBENATE DIMEGLUMINE 529 MG/ML IV SOLN COMPARISON:  CT abdomen/pelvis dated 05/31/2018  FINDINGS: Motion degraded images. Lower chest: Lung bases are clear. Hepatobiliary: Liver is within normal limits.  No hepatic steatosis. Gallbladder is mildly distended. No cholelithiasis or associated inflammatory changes. No intrahepatic or extrahepatic ductal dilatation. Common duct measures 5 mm. No choledocholithiasis is seen. Pancreas: Mild peripancreatic fluid/inflammatory changes, particularly along the superior aspect of the pancreatic body (series 7/image 17). No drainable fluid collection/pseudocyst. No evidence of pancreatic necrosis. Spleen:  Within normal limits. Adrenals/Urinary Tract:  Adrenal glands are within normal limits. Kidneys are within normal limits.  No hydronephrosis. Stomach/Bowel: Stomach is within normal limits. Visualized bowel is unremarkable. Vascular/Lymphatic:  No evidence of aneurysm. No suspicious abdominal lymphadenopathy. Other:  No abdominal ascites. Musculoskeletal: No focal osseous lesions. IMPRESSION: Mild peripancreatic fluid/inflammatory changes, compatible with known acute pancreatitis. No drainable fluid collection/pseudocyst. Electronically Signed   By: Charline Bills M.D.   On: 06/06/2018 02:12     Management plans discussed with the patient, family and they are in agreement.  CODE STATUS:     Code Status Orders  (From admission, onward)          Start     Ordered   05/27/18 2030  Full code  Continuous     05/27/18 2029        Code Status History    Date Active Date Inactive Code Status Order ID Comments User Context   04/25/2017 0646 04/26/2017 1941 Full Code [id number]  Ihor Austin, MD Inpatient      TOTAL TIME TAKING CARE OF THIS PATIENT: 40 minutes.    Enedina Finner M.D on 06/06/2018 at 11:39 AM  Between 7am to 6pm - Pager - [phone] After 6pm go to [email or web address] - Social research officer, government  [facility] [place] [facility]  Office  [phone]  CC: Primary care physician; Titus Mould, NP

## 2018-06-06 NOTE — Progress Notes (Signed)
Discussed discharge instructions and medications with patient. PICC line removed. All questions addressed. Patient transported home via car by her neighbor.  Orson Ape, RN, BSN

## 2018-06-06 NOTE — Care Management (Signed)
Discharge to home today per Dr. Allena Katz. No follow-up needs identified. Neigh bor will transport. Gwenette Greet RN MSN CCM Care management [phone]

## 2018-06-06 NOTE — Progress Notes (Addendum)
Nutrition Follow-up  DOCUMENTATION CODES:   Morbid obesity  INTERVENTION:  TPN was discontinued this morning.  Provide Ensure Enlive po BID, each supplement provides 350 kcal and 20 grams of protein. Patient prefers chocolate.  Recommend daily MVI.  RD provided "Pancreatitis Nutrition Therapy" and "Pancreatitis Label Reading Tips" handouts from the Academy of Nutrition and Dietetics. Discussed role of the pancreas and how it helps digest food. Reviewed that foods high in fat are especially hard to digest and can cause pain. Encouraged patient to eat a low-fat diet where total fat intake is limited to 25-30% of Calories eaten per day. For patient this comes out to 65-75 grams of fat daily. Encouraged intake of small, low-fat meals throughout the day to help ease pain and encouraged patient to drink adequate fluids. Discouraged drinking alcoholic beverages. Provided a list of foods not recommended on a low-fat diet and discussed alternatives patient will enjoy. Also reviewed label reading tips, especially the importance of comparing portion size to amount patient is planning on eating. Teach back method used.  NUTRITION DIAGNOSIS:   Inadequate oral intake related to acute illness(acute pancreatitis) as evidenced by NPO status, per patient/family report.  Oral intake improving.  GOAL:   Patient will meet greater than or equal to 90% of their needs  Progressing.  MONITOR:   Diet advancement, Labs, Weight trends, I & O's  REASON FOR ASSESSMENT:   Consult New TPN/TNA  ASSESSMENT:   37 year old female with PMHx of anxiety, depression, migraines, fibromyalgia, asthma, arthritis who presented with severe abdominal pain found to have acute pancreatitis.  Met with patient at bedside. Patient's diet was advanced to clear liquids on evening of 10/1 and then heart healthy on 10/2 (for low-fat diet; heart healthy diet limits to 20 grams of fat per meal and 60 grams of fat per day). She  reports she has been tolerating diet even though she is not eating much at meals. Last night for dinner she had grilled chicken breast, sweet potato, and zucchini. This morning for breakfast she had [place] toast and pineapple. Patient is requesting protein shake to help meet her protein needs. She is lactose-intolerant.  Medications reviewed and include: Colace, Novolog 0-9 units Q6hrs, Miralax.  Labs reviewed: CBG 118-132.  Discussed with RN.  Diet Order:   Diet Order            Diet Heart Room service appropriate? Yes; Fluid consistency: Thin  Diet effective now              EDUCATION NEEDS:   No education needs have been identified at this time  Skin:  Skin Assessment: Reviewed RN Assessment  Last BM:  05/27/2018  Height:   Ht Readings from Last 1 Encounters:  05/27/18 '5\' 7"'  (1.702 m)    Weight:   Wt Readings from Last 1 Encounters:  06/05/18 135.7 kg    Ideal Body Weight:  61.4 kg  BMI:  Body mass index is 46.86 kg/m.  Estimated Nutritional Needs:   Kcal:  8299-3716 (MSJ x 1.1-1.2)  Protein:  125-140 grams (0.9-1 grams/kg actual body weight; >2 grams/kg IBW)  Fluid:  1.8-2.1 L/day (30-35 mL/kg IBW)  Willey Blade, MS, RD, LDN Office: [phone] Pager: [phone] After Hours/Weekend Pager: [phone]

## 2018-06-07 ENCOUNTER — Ambulatory Visit: Payer: Self-pay | Admitting: Family Medicine

## 2018-06-10 ENCOUNTER — Encounter: Payer: Self-pay | Admitting: Pharmacist

## 2018-06-20 ENCOUNTER — Ambulatory Visit: Payer: Self-pay | Admitting: Gastroenterology

## 2018-07-04 ENCOUNTER — Encounter: Payer: Self-pay | Admitting: Pharmacist

## 2018-07-22 DIAGNOSIS — K8500 Idiopathic acute pancreatitis without necrosis or infection: Secondary | ICD-10-CM | POA: Insufficient documentation

## 2018-09-25 ENCOUNTER — Ambulatory Visit: Payer: Self-pay | Admitting: Pharmacist

## 2018-09-25 ENCOUNTER — Encounter: Payer: Self-pay | Admitting: Pharmacist

## 2018-09-25 ENCOUNTER — Other Ambulatory Visit: Payer: Self-pay

## 2018-09-25 VITALS — BP 128/70 | Wt 280.0 lb

## 2018-09-25 DIAGNOSIS — I82621 Acute embolism and thrombosis of deep veins of right upper extremity: Secondary | ICD-10-CM

## 2018-09-25 DIAGNOSIS — Z79899 Other long term (current) drug therapy: Secondary | ICD-10-CM

## 2018-09-25 NOTE — Progress Notes (Signed)
[facility] Visit [facility]  Patient: Kelsey Bell MRN: [id number] Date of Birth: 1980/10/11 PCP: Titus Mould, NP   [name] 38 y.o. female presents for a medication management visit today.  BP 128/70 (BP Location: Left Arm, Patient Position: Sitting, Cuff Size: Large)   Wt 280 lb (127 kg)   BMI 43.85 kg/m   Patient Information   Past Medical History:  Diagnosis Date  . Allergy   . Anxiety   . Arthritis   . Asthma   . Breast pain present for several months   Bil LT >RT across of breasts  . CRPS (complex regional pain syndrome type I)   . Depression   . DVT (deep venous thrombosis) (HCC) 06/2018  . Kidney stones   . LGSIL on Pap smear of cervix 2007  . Migraine   . Neuromuscular disorder (HCC)    Complex regional pain syndrome and Fibromyalgia      Past Surgical History:  Procedure Laterality Date  . COLPOSCOPY  2017   neg bx  . EXTRACORPOREAL SHOCK WAVE LITHOTRIPSY Right 04/26/2017   Procedure: EXTRACORPOREAL SHOCK WAVE LITHOTRIPSY (ESWL);  Surgeon: Vanna Scotland, MD;  Location: [facility] ORS;  Service: Urology;  Laterality: Right;  . KNEE ARTHROSCOPY Right   . TONSILLECTOMY    . UPPER ESOPHAGEAL ENDOSCOPIC ULTRASOUND (EUS)    . UPPER GASTROINTESTINAL ENDOSCOPY       Family History  Adopted: Yes  Problem Relation Age of Onset  . Alcohol abuse Mother   . Depression Mother   . Drug abuse Mother   . Mental illness Mother   . Anxiety disorder Mother   . Alcohol abuse Father   . Arthritis Father   . Asthma Father   . COPD Father   . Depression Father   . Diabetes Father   . Drug abuse Father   . Early death Father   . Heart disease Father   . Hyperlipidemia Father   . Hypertension Father   . Kidney disease Father   . Stroke Father   . Diabetes Brother   . Depression Brother   . Kidney cancer Neg Hx   . Bladder Cancer Neg Hx     Family Support: aunt in hospice, cousin and wife are good  support  Lifestyle Diet: Breakfast: blueberry eggo waffle, fresh strawberries, peanut butter, sometimes bacon Lunch: protein shake, sandwich Dinner: grilled chicken, zucchini, squash, sometimes bread Drinks: water, apple juice    Current Exercise Habits: Structured exercise class, Type of exercise: strength training/weights;stretching;treadmill;walking;Other - see comments, Frequency (Times/Week): 5, Intensity: Moderate       Social History   Substance and Sexual Activity  Alcohol Use No      Social History   Tobacco Use  Smoking Status Never Smoker  Smokeless Tobacco Never Used      Health Maintenance  Topic Date Due  . TETANUS/TDAP  07/08/1999  . PAP SMEAR-Modifier  06/14/2020  . INFLUENZA VACCINE  Completed  . HIV Screening  Completed   Outpatient Encounter Medications as of 09/25/2018  Medication Sig  . albuterol (PROVENTIL HFA;VENTOLIN HFA) 108 (90 Base) MCG/ACT inhaler Inhale 1 puff into the lungs as needed for wheezing or shortness of breath.  . ALPRAZolam (XANAX) 0.25 MG tablet Take 0.25 mg by mouth as needed for anxiety.  . Brexpiprazole (REXULTI) 2 MG TABS Take 2 mg by mouth daily.  . budesonide-formoterol (SYMBICORT) 160-4.5 MCG/ACT inhaler Inhale 2 puffs into the lungs 2 (two) times daily. Using as needed  .  Cholecalciferol (VITAMIN D3) 5000 units TABS Take 10,000 Units by mouth daily.   . cyclobenzaprine (FLEXERIL) 10 MG tablet Take 10 mg by mouth 3 (three) times daily as needed for muscle spasms.  Marland Kitchen. dicyclomine (BENTYL) 20 MG tablet Take 10 mg by mouth 4 (four) times daily -  before meals and at bedtime. Takes 20 mg  . famotidine (PEPCID) 20 MG tablet Take 20 mg by mouth at bedtime.  Marland Kitchen. FLUoxetine (PROZAC) 40 MG capsule Take 80 mg by mouth at bedtime.   . fluticasone (FLONASE) 50 MCG/ACT nasal spray Place 2 sprays into the nose daily.   . furosemide (LASIX) 20 MG tablet Take 20 mg by mouth daily. Using PRN  . hydrochlorothiazide (MICROZIDE) 12.5 MG capsule  Take 12.5 mg by mouth daily.  Marland Kitchen. lidocaine (LIDODERM) 5 % Place 1 patch onto the skin daily. Remove & Discard patch within 12 hours or as directed by MD  . montelukast (SINGULAIR) 10 MG tablet Take 10 mg by mouth at bedtime.  . ondansetron (ZOFRAN) 8 MG tablet Take 1 tablet (8 mg total) by mouth every 8 (eight) hours as needed for nausea or vomiting.  . promethazine (PHENERGAN) 12.5 MG tablet Take 12.5-25 mg by mouth every 6 (six) hours as needed for nausea or vomiting.  . rivaroxaban (XARELTO) 20 MG TABS tablet Take 20 mg by mouth daily with supper.  . sodium chloride (OCEAN) 0.65 % SOLN nasal spray Place 1 spray into both nostrils at bedtime.  . sucralfate (CARAFATE) 1 g tablet Take 1 g by mouth 4 (four) times daily -  with meals and at bedtime. PRN  . tamsulosin (FLOMAX) 0.4 MG CAPS capsule Take 1 capsule (0.4 mg total) by mouth daily.  . calcium carbonate (OS-CAL) 600 MG TABS tablet Take 600 mg by mouth daily.  . meloxicam (MOBIC) 15 MG tablet Take 15 mg by mouth daily.  . Multiple Vitamin (MULTI-VITAMINS) TABS Take 1 tablet by mouth daily.  . [DISCONTINUED] feeding supplement, ENSURE ENLIVE, (ENSURE ENLIVE) LIQD Take 237 mLs by mouth 2 (two) times daily between meals.  . [DISCONTINUED] HYDROcodone-acetaminophen (NORCO/VICODIN) 5-325 MG tablet Take 1-2 tablets by mouth every 6 (six) hours as needed for severe pain.  . [DISCONTINUED] ranitidine (ZANTAC) 75 MG tablet Take 75 mg by mouth daily as needed for heartburn.   No facility-administered encounter medications on file as of 09/25/2018.    Health Maintenance/Date Completed  Last ED visit: this month Last Visit to PCP: last week Next Visit to PCP: when needed Specialist Visit: GI, nephrologist, hematologist, pulmonologist, rheumatologist  Dental Exam: 2018 Eye Exam: last year Prostate Exam: NA Pelvic/PAP Exam: last year Mammogram: at 38 years old DEXA: NA Colonoscopy: no Flu Vaccine: yes Pneumonia Vaccine: no   Assessment and  Plan: Anxiety/depression: Rexulti, Prozac, Xanax PRN. Reports that Rexulti makes her "feel numb emotionally" but reports this is not necessarily a bad thing for her lately. She says overall these medications are working for her.  Asthma: albuterol and Symbicort PRN. Lately has not been using as much. Has to use more in the winter months, will have to use multiple times a day. Otherwise no other issues.  Allergies: Flonase, Ocean spray, Singulair. Does not have any symptoms with these medications.  DVT: Xarelto. DVT in upper right arm as a result of PICC line, diagnosed in October 2019. Does report she bruises easily and takes longer to heal. She hopes to be off of Xarelto soon and wanted to know how long I thought she  would have to be on it. I explained that length of therapy can vary based on whether there was a cause for the DVT or no suspected source and that ultimately the decision would be based on what her MD thought. She follows up with them in February.  Edema: HCTZ daily and furosemide PRN. Has had increased edema lately secondary to having to do Depo shot while on Xarelto. Using furosemide more lately.  H/o kidney stones: thought to be likely due to Topamax. Tamsulosin PRN. Does not report any stones since stopping Topamax.  GI/pancreatitis: dicyclomine, sucralfate, Pepcid, Zofran, Phenergan. Waiting on getting Creon. Seems to be well controlled since taking these medications and hopes she can get off of them at some point.  Fibromyalgia: Tylenol PRN, used to be on Topamax but had to stop for frequent kidney stones. Notices symptoms sometimes, but is pretty much controlled.  Pinched nerve: Flexeril, Lidocaine patch. These are helping.  Compliance: does not miss doses. We will fill several of her medications today.  She will return in one year for annual follow up MTM.  Pricilla Riffle, PharmD Pharmacy Resident

## 2018-10-24 DIAGNOSIS — Z1379 Encounter for other screening for genetic and chromosomal anomalies: Secondary | ICD-10-CM | POA: Insufficient documentation

## 2019-03-05 ENCOUNTER — Telehealth: Payer: Self-pay | Admitting: Pharmacy Technician

## 2019-03-05 NOTE — Telephone Encounter (Signed)
Received 2020 proof of income.  Patient eligible to receive [facility] assistance at [facility] as long as eligibility requirements continue to be met.  [place] [facility]

## 2019-03-06 ENCOUNTER — Ambulatory Visit
Admission: RE | Admit: 2019-03-06 | Discharge: 2019-03-06 | Disposition: A | Discharge status: Home / Self Care | Payer: Self-pay | Source: Ambulatory Visit | Attending: Family Medicine | Admitting: Family Medicine

## 2019-03-06 ENCOUNTER — Other Ambulatory Visit: Payer: Self-pay | Admitting: Family Medicine

## 2019-03-06 ENCOUNTER — Other Ambulatory Visit: Payer: Self-pay

## 2019-03-06 DIAGNOSIS — D72829 Elevated white blood cell count, unspecified: Secondary | ICD-10-CM

## 2019-03-06 DIAGNOSIS — K861 Other chronic pancreatitis: Secondary | ICD-10-CM

## 2019-03-06 MED ORDER — IOHEXOL 300 MG/ML  SOLN
100.0000 mL | Freq: Once | INTRAMUSCULAR | Status: AC | PRN
  Administered 2019-03-06: 100 mL via INTRAVENOUS

## 2019-03-26 ENCOUNTER — Telehealth: Payer: Self-pay | Admitting: Pharmacist

## 2019-03-26 NOTE — Telephone Encounter (Signed)
03/26/2019 4:02:02 PM - Prozac/Lilly renewal  03/26/2019 Time for patient to renew with [employer] [facility] for [employer] application, mailing patient her portion to sign & return, also mailing provider his portion to sign & return to Attn: Caryl Pina.Kelsey Bell

## 2019-03-26 NOTE — Telephone Encounter (Signed)
03/26/2019 4:04:09 PM - Ventolin HFA/GSK renewal  03/26/2019 Time for patient to renew with [place] for Ventolin-Mailing patient her portion to sign & return, also mailing Dr. Raul Del @ [facility] his portion to sign & retutn.Delos Haring

## 2019-10-02 ENCOUNTER — Other Ambulatory Visit: Payer: Self-pay

## 2019-10-02 ENCOUNTER — Ambulatory Visit: Payer: Self-pay

## 2019-10-02 DIAGNOSIS — Z79899 Other long term (current) drug therapy: Secondary | ICD-10-CM

## 2019-10-02 NOTE — Progress Notes (Signed)
[facility] Visit Note  Patient: Kelsey Bell MRN: [id number] Date of Birth: 02/29/1981 PCP: Ricardo Jericho, NP   Ala Dach 39 y.o. female presents for a telephone medication therapy management visit with the pharmacist today.   There were no vitals taken for this visit.  Patient Information   Past Medical History:  Diagnosis Date  . Allergy   . Anxiety   . Arthritis   . Asthma   . Breast pain present for several months   Bil LT >RT across of breasts  . CRPS (complex regional pain syndrome type I)   . Depression   . DVT (deep venous thrombosis) ([place]) 06/2018  . Kidney stones   . LGSIL on Pap smear of cervix 2007  . Migraine   . Neuromuscular disorder ([place])    Complex regional pain syndrome and Fibromyalgia      Past Surgical History:  Procedure Laterality Date  . COLPOSCOPY  2017   neg bx  . EXTRACORPOREAL SHOCK WAVE LITHOTRIPSY Right 04/26/2017   Procedure: EXTRACORPOREAL SHOCK WAVE LITHOTRIPSY (ESWL);  Surgeon: Hollice Espy, MD;  Location: [facility] ORS;  Service: Urology;  Laterality: Right;  . KNEE ARTHROSCOPY Right   . TONSILLECTOMY    . UPPER ESOPHAGEAL ENDOSCOPIC ULTRASOUND (EUS)    . UPPER GASTROINTESTINAL ENDOSCOPY       Family History  Adopted: Yes  Problem Relation Age of Onset  . Alcohol abuse Mother   . Depression Mother   . Drug abuse Mother   . Mental illness Mother   . Anxiety disorder Mother   . Alcohol abuse Father   . Arthritis Father   . Asthma Father   . COPD Father   . Depression Father   . Diabetes Father   . Drug abuse Father   . Early death Father   . Heart disease Father   . Hyperlipidemia Father   . Hypertension Father   . Kidney disease Father   . Stroke Father   . Diabetes Brother   . Depression Brother   . Kidney cancer Neg Hx   . Bladder Cancer Neg Hx    Family Support: Good  Social History   Substance and Sexual Activity  Alcohol Use No   Denies alcohol use   Social History    Tobacco Use  Smoking Status Never Smoker  Smokeless Tobacco Never Used   Denies tobacco and illicit substance use   Health Maintenance  Topic Date Due  . INFLUENZA VACCINE  04/05/2019  . PAP SMEAR-Modifier  06/14/2020  . TETANUS/TDAP  09/21/2022  . HIV Screening  Completed   Outpatient Encounter Medications as of 10/02/2019  Medication Sig  . acetaminophen (TYLENOL) 500 MG tablet Take 1,000 mg by mouth every 8 (eight) hours as needed.  Marland Kitchen albuterol (PROVENTIL HFA;VENTOLIN HFA) 108 (90 Base) MCG/ACT inhaler Inhale 1 puff into the lungs as needed for wheezing or shortness of breath.  . budesonide-formoterol (SYMBICORT) 160-4.5 MCG/ACT inhaler Inhale 2 puffs into the lungs 2 (two) times daily. Using as needed  . dicyclomine (BENTYL) 20 MG tablet Take 10 mg by mouth 4 (four) times daily -  before meals and at bedtime. Takes 20 mg  . famotidine (PEPCID) 20 MG tablet Take 40 mg by mouth 2 (two) times daily.   Marland Kitchen FLUoxetine (PROZAC) 40 MG capsule Take 80 mg by mouth at bedtime.   . fluticasone (FLONASE) 50 MCG/ACT nasal spray Place 2 sprays into the nose daily.   . furosemide (LASIX) 20 MG  tablet Take 20 mg by mouth daily. Using PRN  . HYDROmorphone (DILAUDID) 2 MG tablet Take 2 mg by mouth as directed. Patient is taking 2 mg three times daily as needed and 4 mg nightly  . ibuprofen (ADVIL) 800 MG tablet Take 800 mg by mouth 2 (two) times daily as needed for moderate pain.  Marland Kitchen lidocaine (LIDODERM) 5 % Place 1 patch onto the skin daily. Remove & Discard patch within 12 hours or as directed by MD  . lipase/protease/amylase (CREON) 36000 UNITS CPEP capsule Take 2 capsules by mouth as directed. 2 capsules prior to tube feeds, 2 capsules during tube feeds, and 2 capsules after tube feeds  . meloxicam (MOBIC) 15 MG tablet Take 15 mg by mouth daily.  . methocarbamol (ROBAXIN) 500 MG tablet Take 1,000 mg by mouth 4 (four) times daily.  . montelukast (SINGULAIR) 10 MG tablet Take 10 mg by mouth at  bedtime.  . ondansetron (ZOFRAN) 8 MG tablet Take 1 tablet (8 mg total) by mouth every 8 (eight) hours as needed for nausea or vomiting.  . pantoprazole (PROTONIX) 40 MG tablet Take 40 mg by mouth 2 (two) times daily.  . prochlorperazine (COMPAZINE) 5 MG tablet Take 5 mg by mouth every 6 (six) hours as needed for nausea or vomiting.  . promethazine (PHENERGAN) 12.5 MG tablet Take 12.5-25 mg by mouth every 6 (six) hours as needed for nausea or vomiting.  . sodium chloride (OCEAN) 0.65 % SOLN nasal spray Place 1 spray into both nostrils at bedtime.  . Tiotropium Bromide Monohydrate (SPIRIVA RESPIMAT) 1.25 MCG/ACT AERS Inhale 2 puffs into the lungs daily.  Marland Kitchen topiramate (TOPAMAX) 50 MG tablet Take 250 mg by mouth daily.  . vitamin B-12 (CYANOCOBALAMIN) 1000 MCG tablet Take 1,000 mcg by mouth daily.  Marland Kitchen ALPRAZolam (XANAX) 0.25 MG tablet Take 0.25 mg by mouth as needed for anxiety.  . Multiple Vitamin (MULTI-VITAMINS) TABS Take 1 tablet by mouth daily.  . naloxone (NARCAN) nasal spray 4 mg/0.1 mL Place 1 spray into the nose as needed (Opioid overdose).  . [DISCONTINUED] Brexpiprazole (REXULTI) 2 MG TABS Take 2 mg by mouth daily.  . [DISCONTINUED] calcium carbonate (OS-CAL) 600 MG TABS tablet Take 600 mg by mouth daily.  . [DISCONTINUED] Cholecalciferol (VITAMIN D3) 5000 units TABS Take 10,000 Units by mouth daily.   . [DISCONTINUED] cyclobenzaprine (FLEXERIL) 10 MG tablet Take 10 mg by mouth 3 (three) times daily as needed for muscle spasms.  . [DISCONTINUED] hydrochlorothiazide (MICROZIDE) 12.5 MG capsule Take 12.5 mg by mouth daily.  . [DISCONTINUED] rivaroxaban (XARELTO) 20 MG TABS tablet Take 20 mg by mouth daily with supper.  . [DISCONTINUED] sucralfate (CARAFATE) 1 g tablet Take 1 g by mouth 4 (four) times daily -  with meals and at bedtime. PRN  . [DISCONTINUED] tamsulosin (FLOMAX) 0.4 MG CAPS capsule Take 1 capsule (0.4 mg total) by mouth daily.   No facility-administered encounter medications  on file as of 10/02/2019.    Health Maintenance/Date Completed  Last ED visit: Patient reports she was hospitalized at [facility] 08/2019-09/2019 (exact dates unknown). Also endorses admission at [facility] 05/2019-06/2019 (exact dates unknown). Reports admissions were related to chronic pancreatitis.  Last Visit to PCP: [facility] (reports last visit 09/2019) Next Visit to PCP: Unknown Specialist Visit: Patient reports following with multiple specialists including: Gastroenterology (Reported last visit 09/2019) , Pain (Reported last visit 09/2019), Pulmonology (Reported upcoming appointment in 10/2019), Urology (Unk last visit), Psychiatry (Reported last visit >1 year) Dental Exam: Endorses  upcoming appointment on 10/08/2019 Eye Exam: Wears glasses and contacts. Endorses recent eye exam. Pelvic/PAP Exam: Reports she follows with [facility]  Mammogram: Reports 1 mammogram in the past  DEXA: <65 y/o Colonoscopy: Never Flu Vaccine: Endorses receipt Pneumonia Vaccine: Never COVID-19 Vaccine: Not interested. Reports Hx of COVID-19 infection in 04/2019 Shingrix Vaccine: <50 y/o  Assessment and Plan:  1. Idiopathic chronic pancreatitis -Recently hospitalized at [facility]. Follows with GI at [facility].  -Reports she now has GJ tube and is on tube feeds. She is on a clear liquid diet and taking medications by mouth -She is on pancreatic enzymes -Taking dicyclomine as needed for diarrhea  2. Nausea/Acid reflux -Nausea medications include prochlorperazine PRN, promethazine PRN, and ondansetron PRN  -Patient reports constipation with ondansetron so she uses it last-line -Acid suppressants include pantoprazole 40 mg BID and famotidine 40 mg BID. Patient confirmed that provider wants her to be on both.  3. Fibromyalgia/polyarthralgia/low back pain/CRPS in left foot -Patient follows with pain specialist  -Hydromorphone (confirmed in PDMP), meloxicam, methocarbamol, lidocaine patches, ibuprofen PRN, APAP PRN,  Vitamin B12 -She has naloxone nasal spray in case of opioid toxicity -Reports she has been told not to take ibuprofen while on meloxicam but will take as needed for breakthrough pain -On acid suppressants with chronic NSAID use  4. Anxiety/depression -Fluoxetine 80 mg (max dose) at bedtime -No longer on brexipiprazole -Reports not taking alprazolam while on opioids -Patient reports she has a psychiatrist but has not been in >1 year as symptoms are under control  5. Asthma -Spiriva, Symbicort, Albuterol HFA PRN -Patient follows with a pulmonologist (Dr. Meredeth Ide) and endorses upcoming appointment in 10/2019 -Using maintenance inhalers PRN rather than daily as prescribed as symptoms are controlled -Advised patient to discuss inhaler frequency with pulmonologist at upcoming visit  6. Allergies -Montelukast, Flonase nasal spray, Ocean nasal spray  7. Migraines -Topiramate 250 mg  -Per chart, appears patient was tapering or had discontinued therapy with topiramate at some point due to c/f association with kidney stones -Medication was recently filled and patient endorsed taking. Will need to confirm with patient if she is still taking and, if so, the dose at next visit.    8. Edema -Furosemide 20 mg PRN  9. H/o kidney stones -Follows with urologist  10. Adherence -Patient reports she takes medications as prescribed -Does not utilize a pill box but stays organized in medication cabinet -Not requiring refills on medications at this time -Inquiring about receiving famotidine through [facility]  RTC 6 months for MTM  Dorothea Ogle Pharmacy Resident 02 October 2019

## 2019-10-07 ENCOUNTER — Other Ambulatory Visit: Payer: Self-pay

## 2019-10-08 DIAGNOSIS — N8302 Follicular cyst of left ovary: Secondary | ICD-10-CM

## 2019-10-28 IMAGING — CT CT ABD-PELV W/O CM
2 of 4 series · 16 of 46 positions shown, 18 images · non-contrast
Comparison: CT scan of May 27, 2018.

CLINICAL DATA: Upper abdominal pain.

EXAM:
CT ABDOMEN AND PELVIS WITHOUT CONTRAST
TECHNIQUE: Multidetector CT imaging of the abdomen and pelvis was performed
following the standard protocol without IV contrast.

[Series 2: routine abd/pel wo · axial · 0.78mm/px · z∈[-1004,-544]mm · 13 of 102 slices shown, 15 images]
[im 5/102  soft-tissue]
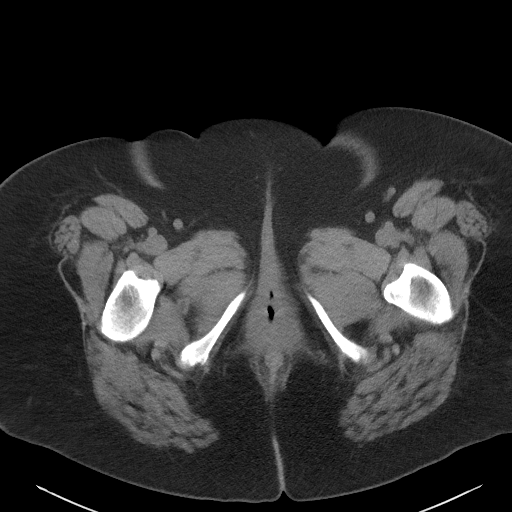
[im 5/102  bone]
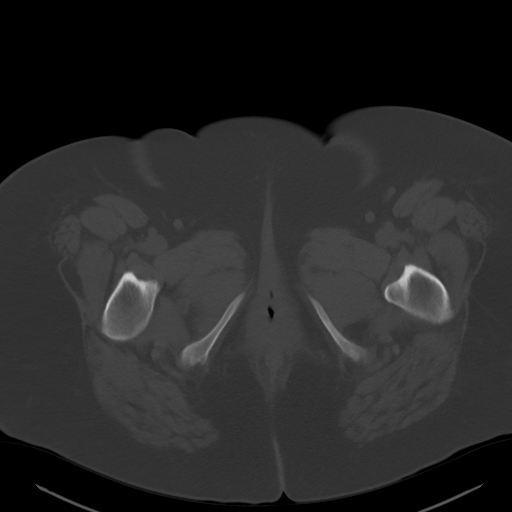
[im 13/102  soft-tissue]
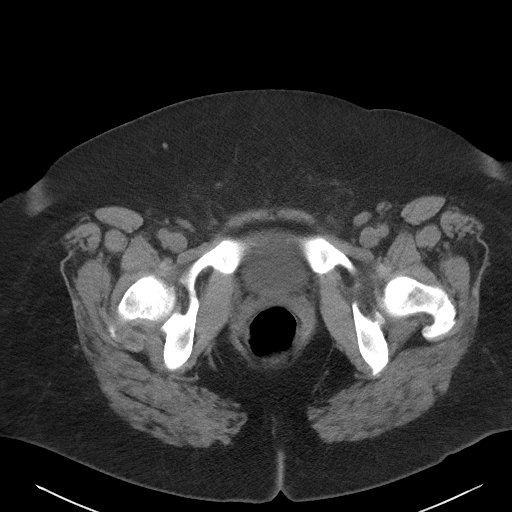
[im 21/102  soft-tissue]
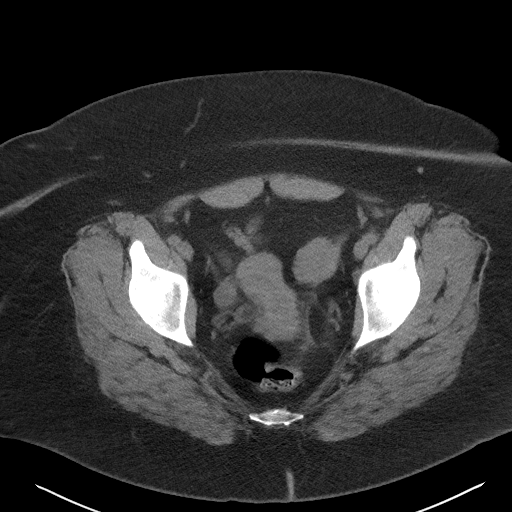
[im 29/102  soft-tissue]
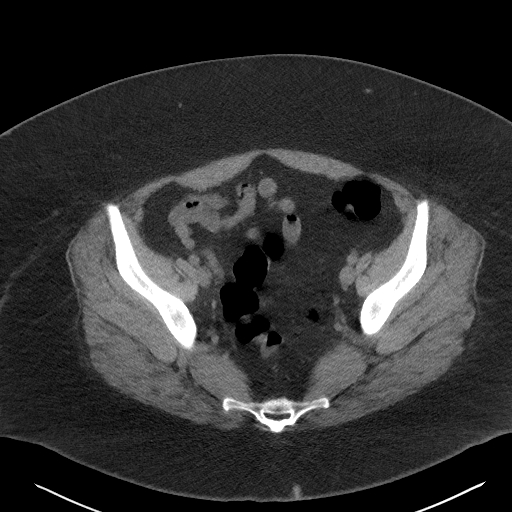
[im 37/102  soft-tissue]
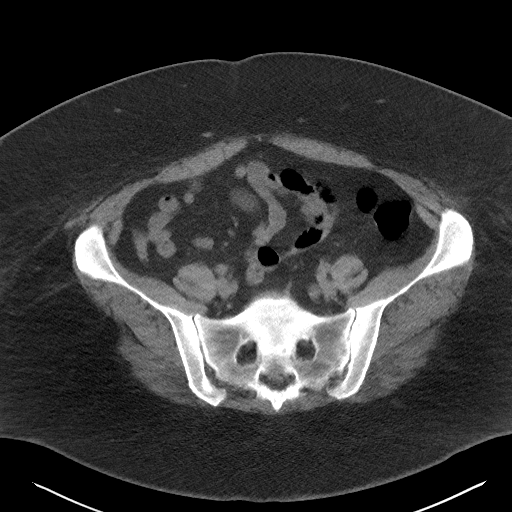
[im 45/102  soft-tissue]
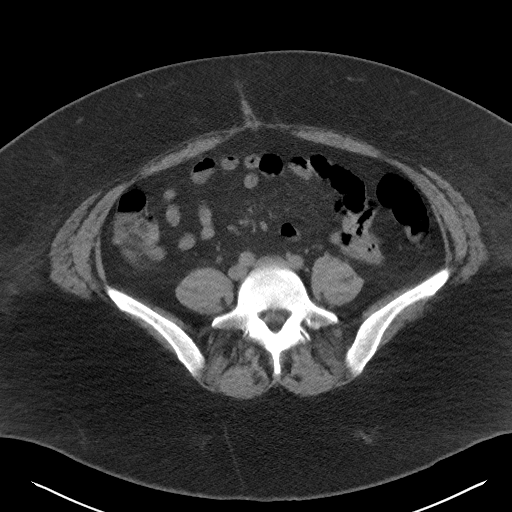
[im 53/102  soft-tissue]
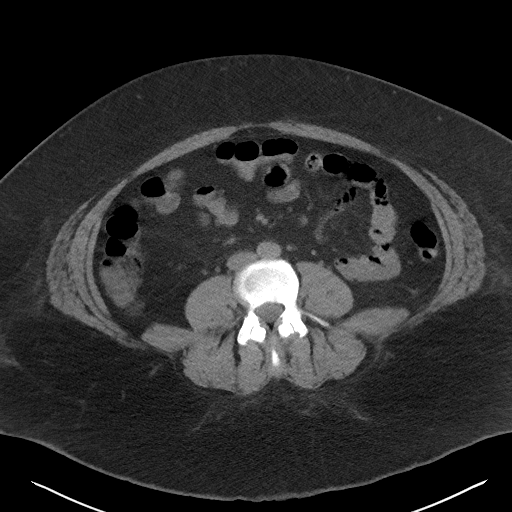
[im 57/102  soft-tissue]
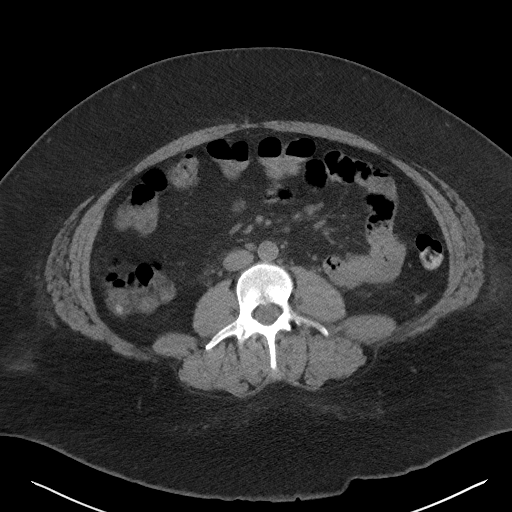
[im 65/102  soft-tissue]
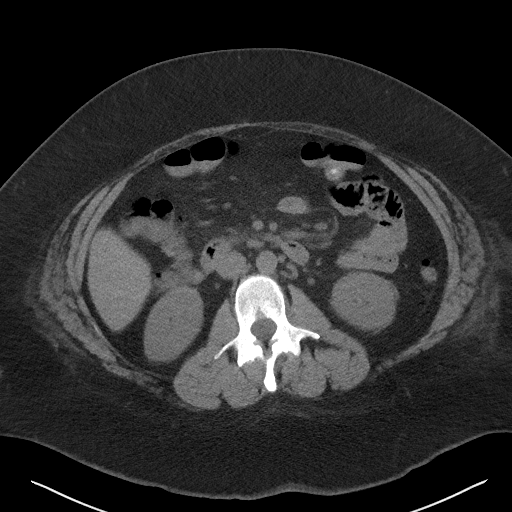
[im 65/102  bone]
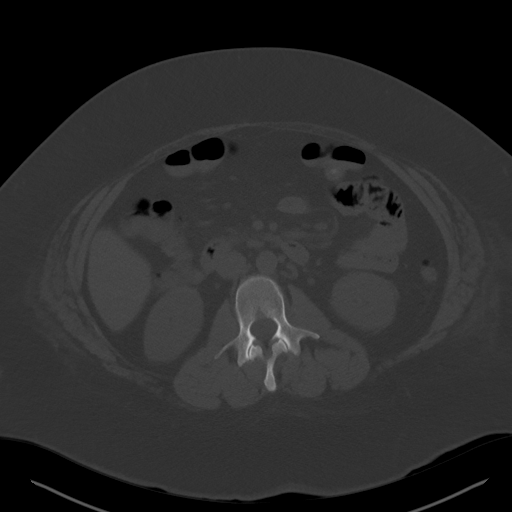
[im 73/102  soft-tissue]
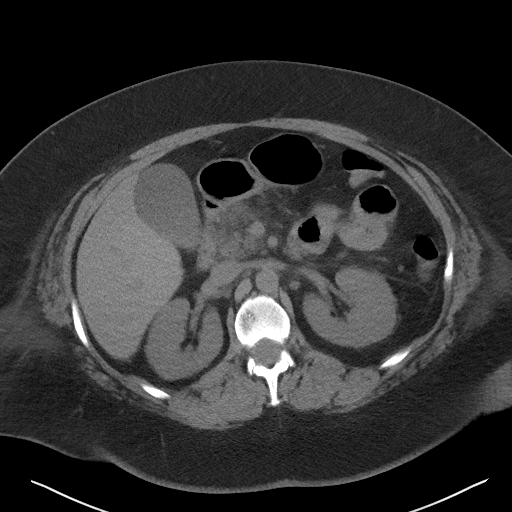
[im 81/102  soft-tissue]
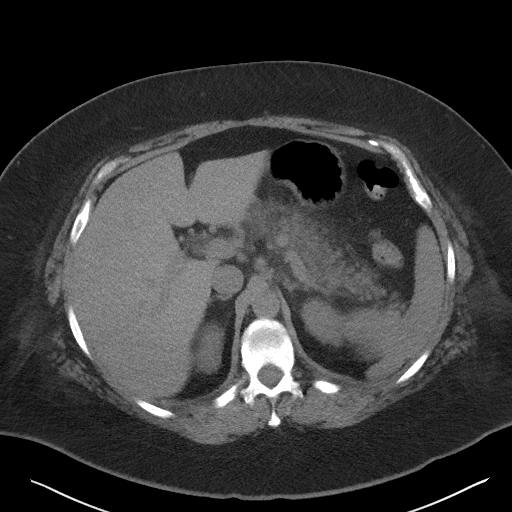
[im 89/102  soft-tissue]
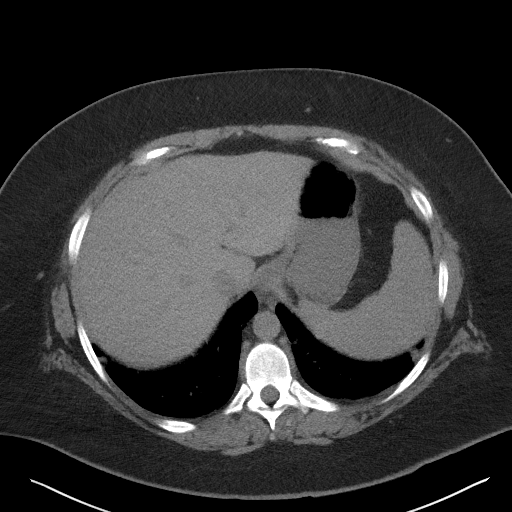
[im 97/102  soft-tissue]
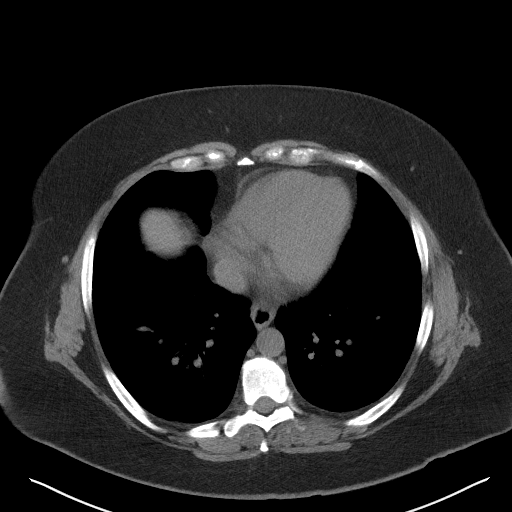

[Series 5: coronal st · coronal · 0.81mm/px · 3 of 84 slices shown]
[im 28/84  soft-tissue]
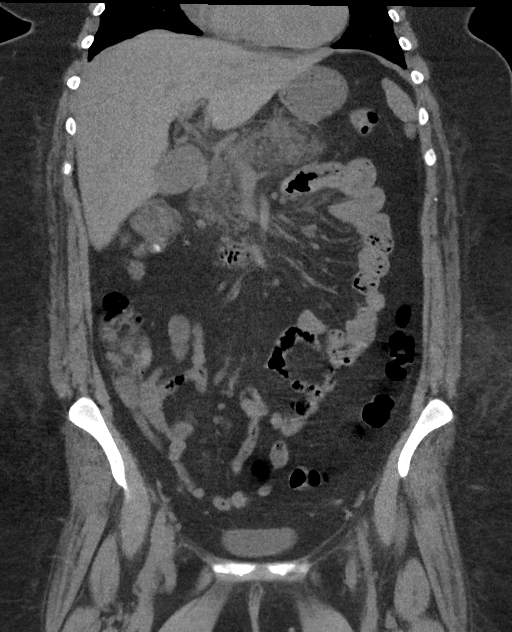
[im 37/84  soft-tissue]
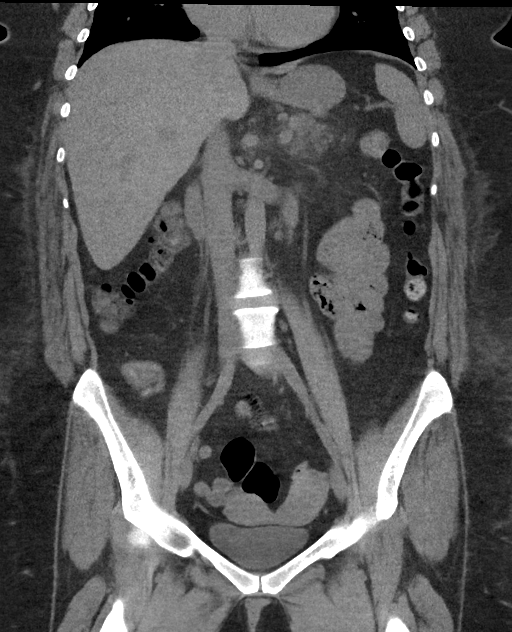
[im 47/84  soft-tissue]
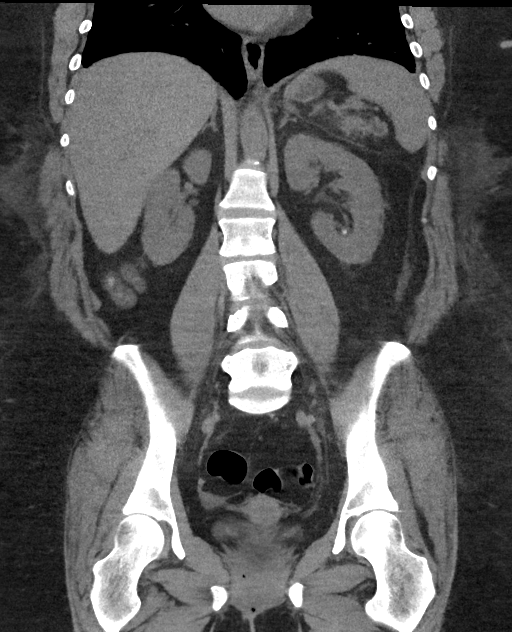

[16 of 46 positions shown; findings below may reference images not displayed]

FINDINGS: Lower chest: No acute abnormality.

Hepatobiliary: No focal liver abnormality is seen. No gallstones,
gallbladder wall thickening, or biliary dilatation.

Pancreas: Significantly increased inflammatory changes are seen
involving the entire pancreas consistent with worsening
pancreatitis. No pseudocyst formation is seen at this time. No
ductal dilatation is noted.

Spleen: Normal in size without focal abnormality.

Adrenals/Urinary Tract: Adrenal glands are unremarkable. Small
nonobstructive left renal calculus is noted. No hydronephrosis or
renal obstruction is noted.. Bladder is unremarkable.

Stomach/Bowel: Stomach is within normal limits. Appendix appears
normal. No evidence of bowel wall thickening, distention, or
inflammatory changes.

Vascular/Lymphatic: No significant vascular findings are present. No
enlarged abdominal or pelvic lymph nodes.

Reproductive: Uterus and bilateral adnexa are unremarkable.

Other: No abdominal wall hernia or abnormality. No abdominopelvic
ascites.

Musculoskeletal: No acute or significant osseous findings.
IMPRESSION: Significantly increased peripancreatic inflammatory changes
consistent with worsening pancreatitis. No pseudocyst formation is
noted.

Small nonobstructive left renal calculus. No hydronephrosis or renal
obstruction is noted.

## 2019-11-17 ENCOUNTER — Telehealth: Payer: Self-pay | Admitting: Pharmacy Technician

## 2019-11-17 NOTE — Telephone Encounter (Signed)
Received updated proof of income.  Patient eligible to receive medication assistance at [facility] until time for re-certification in 9359, and as long as eligibility requirements continue to be met.  [place] [facility]

## 2019-11-18 ENCOUNTER — Telehealth: Payer: Self-pay | Admitting: Pharmacy Technician

## 2019-11-18 NOTE — Telephone Encounter (Signed)
Patient has Medicaid with prescription coverage.  No longer meets [facility]'s eligibility criteria.  Patient notified.  Qadir Folks J. Zameria Vogl Care Manager [facility] 

## 2020-06-09 ENCOUNTER — Emergency Department: Payer: Medicaid Other

## 2020-06-09 ENCOUNTER — Observation Stay
Admission: EM | Admit: 2020-06-09 | Discharge: 2020-06-10 | Disposition: A | Discharge status: Home / Self Care | Payer: Medicaid Other | Attending: Internal Medicine | Admitting: Internal Medicine

## 2020-06-09 ENCOUNTER — Other Ambulatory Visit: Payer: Self-pay

## 2020-06-09 DIAGNOSIS — Z7982 Long term (current) use of aspirin: Secondary | ICD-10-CM | POA: Diagnosis not present

## 2020-06-09 DIAGNOSIS — R55 Syncope and collapse: Secondary | ICD-10-CM | POA: Diagnosis not present

## 2020-06-09 DIAGNOSIS — E860 Dehydration: Secondary | ICD-10-CM

## 2020-06-09 DIAGNOSIS — J45909 Unspecified asthma, uncomplicated: Secondary | ICD-10-CM | POA: Diagnosis not present

## 2020-06-09 DIAGNOSIS — I951 Orthostatic hypotension: Secondary | ICD-10-CM

## 2020-06-09 DIAGNOSIS — Z20822 Contact with and (suspected) exposure to covid-19: Secondary | ICD-10-CM | POA: Diagnosis not present

## 2020-06-09 DIAGNOSIS — R531 Weakness: Secondary | ICD-10-CM | POA: Diagnosis present

## 2020-06-09 DIAGNOSIS — R000 Tachycardia, unspecified: Secondary | ICD-10-CM | POA: Diagnosis not present

## 2020-06-09 DIAGNOSIS — Z79899 Other long term (current) drug therapy: Secondary | ICD-10-CM | POA: Diagnosis not present

## 2020-06-09 HISTORY — DX: Pseudocyst of pancreas: K86.3

## 2020-06-09 HISTORY — DX: Fibromyalgia: M79.7

## 2020-06-09 HISTORY — DX: Acute pancreatitis without necrosis or infection, unspecified: K85.90

## 2020-06-09 LAB — URINALYSIS, COMPLETE (UACMP) WITH MICROSCOPIC
Bilirubin Urine: NEGATIVE
Glucose, UA: NEGATIVE mg/dL
Hgb urine dipstick: NEGATIVE
Ketones, ur: NEGATIVE mg/dL
Leukocytes,Ua: NEGATIVE
Nitrite: NEGATIVE
Protein, ur: 100 mg/dL — AB
Specific Gravity, Urine: 1.021 (ref 1.005–1.030)
pH: 5 (ref 5.0–8.0)

## 2020-06-09 LAB — HEPATIC FUNCTION PANEL
ALT: 24 U/L (ref 0–44)
AST: 50 U/L — ABNORMAL HIGH (ref 15–41)
Albumin: 4.1 g/dL (ref 3.5–5.0)
Alkaline Phosphatase: 81 U/L (ref 38–126)
Bilirubin, Direct: 0.3 mg/dL — ABNORMAL HIGH (ref 0.0–0.2)
Indirect Bilirubin: 0.7 mg/dL (ref 0.3–0.9)
Total Bilirubin: 1 mg/dL (ref 0.3–1.2)
Total Protein: 7.8 g/dL (ref 6.5–8.1)

## 2020-06-09 LAB — BASIC METABOLIC PANEL
Anion gap: 13 (ref 5–15)
BUN: 13 mg/dL (ref 6–20)
CO2: 18 mmol/L — ABNORMAL LOW (ref 22–32)
Calcium: 9.2 mg/dL (ref 8.9–10.3)
Chloride: 107 mmol/L (ref 98–111)
Creatinine, Ser: 1.02 mg/dL — ABNORMAL HIGH (ref 0.44–1.00)
GFR calc non Af Amer: >60 mL/min (ref >60)
Glucose, Bld: 132 mg/dL — ABNORMAL HIGH (ref 70–99)
Potassium: 3.8 mmol/L (ref 3.5–5.1)
Sodium: 138 mmol/L (ref 135–145)

## 2020-06-09 LAB — MAGNESIUM: Magnesium: 2 mg/dL (ref 1.7–2.4)

## 2020-06-09 LAB — CBC
HCT: 39.8 % (ref 36.0–46.0)
Hemoglobin: 13.4 g/dL (ref 12.0–15.0)
MCH: 30.2 pg (ref 26.0–34.0)
MCHC: 33.7 g/dL (ref 30.0–36.0)
MCV: 89.6 fL (ref 80.0–100.0)
Platelets: 355 10*3/uL (ref 150–400)
RBC: 4.44 MIL/uL (ref 3.87–5.11)
RDW: 13.4 % (ref 11.5–15.5)
WBC: 13.7 10*3/uL — ABNORMAL HIGH (ref 4.0–10.5)
nRBC: 0 % (ref 0.0–0.2)

## 2020-06-09 LAB — TROPONIN I (HIGH SENSITIVITY)
Troponin I (High Sensitivity): 19 ng/L — ABNORMAL HIGH (ref <18)
Troponin I (High Sensitivity): 21 ng/L — ABNORMAL HIGH (ref <18)

## 2020-06-09 LAB — LIPASE, BLOOD: Lipase: 23 U/L (ref 11–51)

## 2020-06-09 LAB — RESPIRATORY PANEL BY RT PCR (FLU A&B, COVID)
Influenza A by PCR: NEGATIVE
Influenza B by PCR: NEGATIVE
SARS Coronavirus 2 by RT PCR: NEGATIVE

## 2020-06-09 LAB — POC URINE PREG, ED: Preg Test, Ur: NEGATIVE

## 2020-06-09 MED ORDER — PROCHLORPERAZINE EDISYLATE 10 MG/2ML IJ SOLN
10.0000 mg | Freq: Once | INTRAMUSCULAR | Status: AC
  Administered 2020-06-09: 10 mg via INTRAVENOUS
  Filled 2020-06-09: qty 2

## 2020-06-09 MED ORDER — PANTOPRAZOLE SODIUM 40 MG PO TBEC
40.0000 mg | DELAYED_RELEASE_TABLET | Freq: Every day | ORAL | Status: DC
  Administered 2020-06-09 – 2020-06-10 (×2): 40 mg via ORAL
  Filled 2020-06-09 (×2): qty 1

## 2020-06-09 MED ORDER — TIOTROPIUM BROMIDE MONOHYDRATE 18 MCG IN CAPS
1.0000 | ORAL_CAPSULE | Freq: Every day | RESPIRATORY_TRACT | Status: DC
  Administered 2020-06-09: 18 ug via RESPIRATORY_TRACT
  Filled 2020-06-09 (×2): qty 5

## 2020-06-09 MED ORDER — HYDROMORPHONE HCL 2 MG PO TABS: 2.0000 mg | ORAL_TABLET | ORAL | Status: DC

## 2020-06-09 MED ORDER — FLUTICASONE PROPIONATE 50 MCG/ACT NA SUSP
2.0000 | Freq: Every day | NASAL | Status: DC
  Administered 2020-06-09: 2 via NASAL
  Filled 2020-06-09 (×2): qty 16

## 2020-06-09 MED ORDER — IOHEXOL 350 MG/ML SOLN
100.0000 mL | Freq: Once | INTRAVENOUS | Status: AC | PRN
  Administered 2020-06-09: 100 mL via INTRAVENOUS
  Filled 2020-06-09: qty 100

## 2020-06-09 MED ORDER — VITAMIN B-12 1000 MCG PO TABS
1000.0000 ug | ORAL_TABLET | Freq: Every day | ORAL | Status: DC
  Filled 2020-06-09 (×2): qty 1

## 2020-06-09 MED ORDER — PANCRELIPASE (LIP-PROT-AMYL) 12000-38000 UNITS PO CPEP
24000.0000 [IU] | ORAL_CAPSULE | Freq: Every day | ORAL | Status: DC | PRN
  Filled 2020-06-09: qty 2

## 2020-06-09 MED ORDER — DIPHENHYDRAMINE HCL 12.5 MG/5ML PO ELIX
12.5000 mg | ORAL_SOLUTION | Freq: Once | ORAL | Status: AC
  Administered 2020-06-09: 12.5 mg via ORAL
  Filled 2020-06-09: qty 5

## 2020-06-09 MED ORDER — ALBUTEROL SULFATE HFA 108 (90 BASE) MCG/ACT IN AERS
1.0000 | INHALATION_SPRAY | RESPIRATORY_TRACT | Status: DC | PRN
  Filled 2020-06-09: qty 6.7

## 2020-06-09 MED ORDER — LACTATED RINGERS IV SOLN: INTRAVENOUS | Status: DC

## 2020-06-09 MED ORDER — HYDROMORPHONE HCL 2 MG PO TABS
2.0000 mg | ORAL_TABLET | Freq: Three times a day (TID) | ORAL | Status: DC | PRN
  Administered 2020-06-09: 2 mg via ORAL
  Filled 2020-06-09: qty 1

## 2020-06-09 MED ORDER — PANCRELIPASE (LIP-PROT-AMYL) 12000-38000 UNITS PO CPEP: 72000.0000 [IU] | ORAL_CAPSULE | ORAL | Status: DC

## 2020-06-09 MED ORDER — PROMETHAZINE HCL 25 MG PO TABS
12.5000 mg | ORAL_TABLET | Freq: Once | ORAL | Status: AC
  Administered 2020-06-09: 12.5 mg via ORAL
  Filled 2020-06-09: qty 1

## 2020-06-09 MED ORDER — LACTATED RINGERS IV BOLUS
1000.0000 mL | Freq: Once | INTRAVENOUS | Status: AC
  Administered 2020-06-09: 1000 mL via INTRAVENOUS

## 2020-06-09 MED ORDER — PROMETHAZINE HCL 25 MG PO TABS
25.0000 mg | ORAL_TABLET | Freq: Four times a day (QID) | ORAL | Status: DC | PRN
  Administered 2020-06-10: 25 mg via ORAL
  Filled 2020-06-09: qty 1

## 2020-06-09 MED ORDER — FLUOXETINE HCL 20 MG PO CAPS
80.0000 mg | ORAL_CAPSULE | Freq: Every day | ORAL | Status: DC
  Administered 2020-06-09: 80 mg via ORAL
  Filled 2020-06-09: qty 4

## 2020-06-09 MED ORDER — ACETAMINOPHEN 650 MG RE SUPP: 650.0000 mg | Freq: Four times a day (QID) | RECTAL | Status: DC | PRN

## 2020-06-09 MED ORDER — ACETAMINOPHEN 325 MG PO TABS
650.0000 mg | ORAL_TABLET | Freq: Four times a day (QID) | ORAL | Status: DC | PRN
  Administered 2020-06-09: 650 mg via ORAL
  Filled 2020-06-09: qty 2

## 2020-06-09 MED ORDER — ENOXAPARIN SODIUM 40 MG/0.4ML ~~LOC~~ SOLN
40.0000 mg | SUBCUTANEOUS | Status: DC
  Administered 2020-06-09: 40 mg via SUBCUTANEOUS
  Filled 2020-06-09: qty 0.4

## 2020-06-09 MED ORDER — POLYETHYLENE GLYCOL 3350 17 G PO PACK: 17.0000 g | PACK | Freq: Every day | ORAL | Status: DC | PRN

## 2020-06-09 MED ORDER — PROMETHAZINE HCL 25 MG PO TABS
12.5000 mg | ORAL_TABLET | Freq: Four times a day (QID) | ORAL | Status: DC | PRN
  Administered 2020-06-09: 12.5 mg via ORAL
  Filled 2020-06-09: qty 1

## 2020-06-09 MED ORDER — HYDROMORPHONE HCL 2 MG PO TABS
4.0000 mg | ORAL_TABLET | Freq: Every day | ORAL | Status: DC
  Administered 2020-06-09: 4 mg via ORAL
  Filled 2020-06-09: qty 2

## 2020-06-09 MED ORDER — METHOCARBAMOL 500 MG PO TABS
1000.0000 mg | ORAL_TABLET | Freq: Four times a day (QID) | ORAL | Status: DC
  Administered 2020-06-09 – 2020-06-10 (×2): 1000 mg via ORAL
  Filled 2020-06-09 (×6): qty 2

## 2020-06-09 MED ORDER — MONTELUKAST SODIUM 10 MG PO TABS
10.0000 mg | ORAL_TABLET | Freq: Every day | ORAL | Status: DC
  Administered 2020-06-09: 10 mg via ORAL
  Filled 2020-06-09 (×3): qty 1

## 2020-06-09 MED ORDER — FAMOTIDINE 20 MG PO TABS: 40.0000 mg | ORAL_TABLET | Freq: Two times a day (BID) | ORAL | Status: DC

## 2020-06-09 NOTE — ED Notes (Signed)
Supplies provided for patient to independently complete GJ tube care.

## 2020-06-09 NOTE — ED Provider Notes (Signed)
[facility] Emergency Department Provider Note  ____________________________________________   First MD Initiated Contact with Patient 06/09/20 1454     (approximate)  I have reviewed the triage vital signs and the nursing notes.   HISTORY  Chief Complaint Near Syncope    HPI Kelsey Bell is a 39 y.o. female  With h/o complex regional pain syndrome, chronic pancreatitis, here with near syncope, weakness. Pt reports that her sx started this AM. She woke up feeling fine, went to the store and was helping someone in the parking lot with jumping their car. Felt initially okay but upon standing up and walking inside, she felt lightheaded, dizzy, and like she was going to pass out. She felt like she had "strange vision" with feeling like she couldn't focus. She called a friend and sat down nearby, which improved her sx. However, her sx returned with any attempt to ambulate. She called a friend who is an EMT who checked her HR and told her it was in the 140s. She feels fine at rest now but endorses worsening lightheadedness, palpitations with any attempting standing. No alleviating factor. No recent med changes. She does note that she ran out of her opiates 3 days ago, went 2 days w/o due to insurance issues. She had some nausea, one episode of emesis today and some loose stools with this.        Past Medical History:  Diagnosis Date  . Allergy   . Anxiety   . Arthritis   . Asthma   . Breast pain present for several months   Bil LT >RT across of breasts  . CRPS (complex regional pain syndrome type I)   . Depression   . DVT (deep venous thrombosis) (HCC) 06/2018  . Fibromyalgia   . Kidney stones   . LGSIL on Pap smear of cervix 2007  . Migraine   . Neuromuscular disorder (HCC)    Complex regional pain syndrome and Fibromyalgia  . Pancreatic pseudocyst   . Pancreatitis     Patient Active Problem List   Diagnosis Date Noted  . Dehydration 06/09/2020  .  Genetic testing 10/24/2018  . Idiopathic acute pancreatitis 07/22/2018  . DVT (deep venous thrombosis) (HCC) 06/04/2018  . Acute pancreatitis 05/27/2018  . Right knee pain 05/19/2018  . Peripheral edema 03/05/2018  . Neuropathic pain 11/02/2017  . Kidney stone 04/25/2017  . Ureteral stone   . Migraines 12/15/2016  . Morbid obesity with BMI of 45.0-49.9, adult (HCC) 10/20/2015  . Asthma, moderate 05/21/2015  . Polyarthralgia 12/01/2011  . Fibromyalgia 09/18/2011  . Low back pain 09/18/2011    Past Surgical History:  Procedure Laterality Date  . COLPOSCOPY  2017   neg bx  . ERCP    . EXTRACORPOREAL SHOCK WAVE LITHOTRIPSY Right 04/26/2017   Procedure: EXTRACORPOREAL SHOCK WAVE LITHOTRIPSY (ESWL);  Surgeon: Vanna Scotland, MD;  Location: [facility] ORS;  Service: Urology;  Laterality: Right;  . GASTROSTOMY-JEJEUNOSTOMY TUBE CHANGE/PLACEMENT    . KNEE ARTHROSCOPY Right   . nj placement    . TONSILLECTOMY    . UPPER ESOPHAGEAL ENDOSCOPIC ULTRASOUND (EUS)    . UPPER GASTROINTESTINAL ENDOSCOPY      Prior to Admission medications   Medication Sig Start Date End Date Taking? Authorizing Provider  acetaminophen (TYLENOL) 500 MG tablet Take 1,000 mg by mouth every 8 (eight) hours as needed.   Yes [provider]  aspirin-acetaminophen-caffeine (EXCEDRIN MIGRAINE) [phone] MG tablet Take 1 tablet by mouth every 6 (six) hours  as needed for headache or migraine.   Yes [provider]  famotidine (PEPCID) 20 MG tablet Take 40 mg by mouth 2 (two) times daily.    Yes [provider]  FLUoxetine (PROZAC) 40 MG capsule Take 80 mg by mouth at bedtime.    Yes [provider]  fluticasone (FLONASE) 50 MCG/ACT nasal spray Place 2 sprays into the nose daily.  06/07/17  Yes [provider]  HYDROmorphone (DILAUDID) 2 MG tablet Take 2 mg by mouth as directed. Patient is taking 2 mg three times daily as needed and 4 mg nightly   Yes [provider]   Melatonin 5 MG CHEW Chew 10 mg by mouth at bedtime.   Yes [provider]  meloxicam (MOBIC) 7.5 MG tablet Take 7.5 mg by mouth daily. 05/28/20  Yes [provider]  methocarbamol (ROBAXIN) 500 MG tablet Take 1,000 mg by mouth 4 (four) times daily.   Yes [provider]  montelukast (SINGULAIR) 10 MG tablet Take 10 mg by mouth at bedtime.   Yes [provider]  pantoprazole (PROTONIX) 40 MG tablet Take 40 mg by mouth 2 (two) times daily.   Yes [provider]  prochlorperazine (COMPAZINE) 5 MG tablet Take 5 mg by mouth every 6 (six) hours as needed for nausea or vomiting.   Yes [provider]  promethazine (PHENERGAN) 25 MG tablet Take 25 mg by mouth every 6 (six) hours as needed. 02/27/20  Yes [provider]  sodium chloride (OCEAN) 0.65 % SOLN nasal spray Place 1 spray into both nostrils at bedtime.   Yes [provider]  Tiotropium Bromide Monohydrate (SPIRIVA RESPIMAT) 1.25 MCG/ACT AERS Inhale 2 puffs into the lungs daily. 01/14/19 06/09/20 Yes [provider]  topiramate (TOPAMAX) 50 MG tablet Take 250 mg by mouth daily.   Yes [provider]  albuterol (PROVENTIL HFA;VENTOLIN HFA) 108 (90 Base) MCG/ACT inhaler Inhale 1 puff into the lungs as needed for wheezing or shortness of breath.    Mertie MooresFleming, Herbon E, MD  ALPRAZolam Prudy Feeler(XANAX) 0.25 MG tablet Take 0.25 mg by mouth as needed for anxiety.    [provider]  budesonide-formoterol (SYMBICORT) 160-4.5 MCG/ACT inhaler Inhale 2 puffs into the lungs 2 (two) times daily. Using as needed Patient not taking: Reported on 06/09/2020    Mertie MooresFleming, Herbon E, MD  dicyclomine (BENTYL) 20 MG tablet Take 10 mg by mouth 4 (four) times daily -  before meals and at bedtime. Takes 20 mg Patient not taking: Reported on 06/09/2020    [provider]  furosemide (LASIX) 20 MG tablet Take 20 mg by mouth daily. Using PRN    [provider]  ibuprofen (ADVIL)  800 MG tablet Take 800 mg by mouth 2 (two) times daily as needed for moderate pain. Patient not taking: Reported on 06/09/2020    [provider]  lidocaine (LIDODERM) 5 % Place 1 patch onto the skin daily. Remove & Discard patch within 12 hours or as directed by MD    [provider]  lipase/protease/amylase (CREON) 36000 UNITS CPEP capsule Take 2 capsules by mouth as directed. 2 capsules prior to tube feeds, 2 capsules during tube feeds, and 2 capsules after tube feeds Patient not taking: Reported on 06/09/2020 08/20/18   [provider]  Multiple Vitamin (MULTI-VITAMINS) TABS Take 1 tablet by mouth daily. Patient not taking: Reported on 06/09/2020    [provider]  naloxone [facility](NARCAN) nasal spray 4 mg/0.1 mL Place 1 spray  into the nose as needed (Opioid overdose).    [provider]  ondansetron (ZOFRAN) 8 MG tablet Take 1 tablet (8 mg total) by mouth every 8 (eight) hours as needed for nausea or vomiting. Patient not taking: Reported on 06/09/2020 12/05/17   Joni Reining, PA-C  vitamin B-12 (CYANOCOBALAMIN) 1000 MCG tablet Take 1,000 mcg by mouth daily. Patient not taking: Reported on 06/09/2020    [provider]    Allergies Cefdinir, Esomeprazole, Esomeprazole magnesium, Gabapentin, Hydroxychloroquine, Tapentadol, Sumatriptan succinate, Cortisone, Duloxetine, Prednisone, Amoxicillin, Amoxicillin-pot clavulanate, Morphine, Reglan [metoclopramide], Sulfa antibiotics, and Sulfasalazine  Family History  Adopted: Yes  Problem Relation Age of Onset  . Alcohol abuse Mother   . Depression Mother   . Drug abuse Mother   . Mental illness Mother   . Anxiety disorder Mother   . Alcohol abuse Father   . Arthritis Father   . Asthma Father   . COPD Father   . Depression Father   . Diabetes Father   . Drug abuse Father   . Early death Father   . Heart disease Father   . Hyperlipidemia Father   . Hypertension Father   . Kidney disease Father    . Stroke Father   . Diabetes Brother   . Depression Brother   . Kidney cancer Neg Hx   . Bladder Cancer Neg Hx     Social History Social History   Tobacco Use  . Smoking status: Never Smoker  . Smokeless tobacco: Never Used  Vaping Use  . Vaping Use: Never used  Substance Use Topics  . Alcohol use: No  . Drug use: No    Review of Systems  Review of Systems  Constitutional: Positive for fatigue. Negative for fever.  HENT: Negative for congestion and sore throat.   Eyes: Negative for visual disturbance.  Respiratory: Negative for cough and shortness of breath.   Cardiovascular: Positive for palpitations. Negative for chest pain.  Gastrointestinal: Positive for nausea and vomiting. Negative for abdominal pain and diarrhea.  Genitourinary: Negative for flank pain.  Musculoskeletal: Negative for back pain and neck pain.  Skin: Negative for rash and wound.  Neurological: Positive for syncope and weakness.  All other systems reviewed and are negative.    ____________________________________________  PHYSICAL EXAM:      VITAL SIGNS: ED Triage Vitals  Enc Vitals Group     BP 06/09/20 1344 135/77     Pulse Rate 06/09/20 1344 (!) 122     Resp 06/09/20 1344 18     Temp 06/09/20 1344 98.3 F (36.8 C)     Temp Source 06/09/20 1344 Oral     SpO2 06/09/20 1344 97 %     Weight 06/09/20 1345 280 lb (127 kg)     Height 06/09/20 1345 5\' 7"  (1.702 m)     Head Circumference --      Peak Flow --      Pain Score 06/09/20 1345 8     Pain Loc --      Pain Edu? --      Excl. in GC? --      Physical Exam Vitals and nursing note reviewed.  Constitutional:      General: She is not in acute distress.    Appearance: She is well-developed.  HENT:     Head: Normocephalic and atraumatic.     Mouth/Throat:     Mouth: Mucous membranes are dry.  Eyes:     Conjunctiva/sclera: Conjunctivae normal.  Cardiovascular:  Rate and Rhythm: Regular rhythm. Tachycardia present.     Heart  sounds: Normal heart sounds. No murmur heard.  No friction rub.  Pulmonary:     Effort: Pulmonary effort is normal. No respiratory distress.     Breath sounds: Normal breath sounds. No wheezing or rales.  Abdominal:     General: There is no distension.     Palpations: Abdomen is soft.     Tenderness: There is abdominal tenderness (mild, epigastric, no rebound or guarding).  Musculoskeletal:     Cervical back: Neck supple.  Skin:    General: Skin is warm.     Capillary Refill: Capillary refill takes less than 2 seconds.  Neurological:     Mental Status: She is alert and oriented to person, place, and time.     Motor: No weakness or abnormal muscle tone.     Gait: Gait normal.       ____________________________________________   LABS (all labs ordered are listed, but only abnormal results are displayed)  Labs Reviewed  BASIC METABOLIC PANEL - Abnormal; Notable for the following components:      Result Value   CO2 18 (*)    Glucose, Bld 132 (*)    Creatinine, Ser 1.02 (*)    All other components within normal limits  CBC - Abnormal; Notable for the following components:   WBC 13.7 (*)    All other components within normal limits  URINALYSIS, COMPLETE (UACMP) WITH MICROSCOPIC - Abnormal; Notable for the following components:   Color, Urine AMBER (*)    APPearance CLOUDY (*)    Protein, ur 100 (*)    Bacteria, UA RARE (*)    All other components within normal limits  HEPATIC FUNCTION PANEL - Abnormal; Notable for the following components:   AST 50 (*)    Bilirubin, Direct 0.3 (*)    All other components within normal limits  TROPONIN I (HIGH SENSITIVITY) - Abnormal; Notable for the following components:   Troponin I (High Sensitivity) 19 (*)    All other components within normal limits  RESPIRATORY PANEL BY RT PCR (FLU A&B, COVID)  MAGNESIUM  LIPASE, BLOOD  HIV ANTIBODY (ROUTINE TESTING W REFLEX)  CBC  CREATININE, SERUM  CBC  BASIC METABOLIC PANEL  CBG MONITORING,  ED  POC URINE PREG, ED  TROPONIN I (HIGH SENSITIVITY)    ____________________________________________  EKG: Sinus tachycardia, ventricular 124.  PR 120, QRS 80, QTc 474.  Nonspecific T wave changes.  No acute ST elevations or depressions. ________________________________________  RADIOLOGY All imaging, including plain films, CT scans, and ultrasounds, independently reviewed by me, and interpretations confirmed via formal radiology reads.  ED MD interpretation:   CT angio: Neg for PE  Official radiology report(s): CT Angio Chest PE W and/or Wo Contrast  Result Date: 06/09/2020 CLINICAL DATA:  PE suspected, high prob Near syncope. EXAM: CT ANGIOGRAPHY CHEST WITH CONTRAST TECHNIQUE: Multidetector CT imaging of the chest was performed using the standard protocol during bolus administration of intravenous contrast. Multiplanar CT image reconstructions and MIPs were obtained to evaluate the vascular anatomy. CONTRAST:  OMNIPAQUE IOHEXOL 350 MG/ML SOLN COMPARISON:  No recent exams. FINDINGS: Cardiovascular: Examination is limited by breathing motion artifact and contrast bolus timing. Evaluation is diagnostic to the segmental level. There are no filling defects in the pulmonary arteries to the segmental level to suggest pulmonary embolus. Subsegmental branches are not well assessed. Normal caliber thoracic aorta. No aortic dissection or acute findings. Upper normal heart size. No pericardial  effusion. Mediastinum/Nodes: No enlarged mediastinal or hilar lymph nodes. No visualized thyroid nodule. Slightly patulous esophagus with tiny hiatal hernia. There is no axillary adenopathy. Lungs/Pleura: Mild heterogeneous pulmonary parenchyma. No focal airspace disease. No septal thickening. No pleural fluid. No pulmonary mass or dominant nodule. Trachea and central bronchi are patent. Upper Abdomen: Enlarged liver with steatosis partially included. Catheter within small bowel in the left upper quadrant is  partially included. Musculoskeletal: There are no acute or suspicious osseous abnormalities. Review of the MIP images confirms the above findings. IMPRESSION: 1. No pulmonary embolus to the segmental level. 2. Mild heterogeneous pulmonary parenchyma, can be seen with small airways disease. 3. Borderline cardiomegaly. Electronically Signed   By: Narda Rutherford M.D.   On: 06/09/2020 18:27    ____________________________________________  PROCEDURES   Procedure(s) performed (including Critical Care):  Procedures  ____________________________________________  INITIAL IMPRESSION / MDM / ASSESSMENT AND PLAN / ED COURSE  As part of my medical decision making, I reviewed the following data within the electronic MEDICAL RECORD NUMBER Nursing notes reviewed and incorporated, Old chart reviewed, Notes from prior ED visits, and [place] Controlled Substance Database       *Kelsey Bell was evaluated in Emergency Department on 06/09/2020 for the symptoms described in the history of present illness. She was evaluated in the context of the global COVID-19 pandemic, which necessitated consideration that the patient might be at risk for infection with the SARS-CoV-2 virus that causes COVID-19. Institutional protocols and algorithms that pertain to the evaluation of patients at risk for COVID-19 are in a state of rapid change based on information released by regulatory bodies including the CDC and federal and state organizations. These policies and algorithms were followed during the patient's care in the ED.  Some ED evaluations and interventions may be delayed as a result of limited staffing during the pandemic.*     Medical Decision Making:  39 yo F here with near syncope, tachycardia. Suspect orthostasis in setting of recent vomiting/diarrhea from mild opiate withdrawal combined w/ chronic poor absorption 2/2 chronic pancreatitis w/ GJ tube in place. No fevers. Abdomen is soft, NT, ND. EKG nonischemic but does  show sinus tachycardia. Labs reviewed, show elevated BUN:Cr ratio c/w dehydration and ketonuria. Trop 19 - likely demand but will admit. CT angio obtained 2/2 h/o DVT and tachycardia w/ syncope, and is negative. Reviewed by me. Admit.  ____________________________________________  FINAL CLINICAL IMPRESSION(S) / ED DIAGNOSES  Final diagnoses:  Orthostatic syncope     MEDICATIONS GIVEN DURING THIS VISIT:  Medications  albuterol (VENTOLIN HFA) 108 (90 Base) MCG/ACT inhaler 1 puff (has no administration in time range)  tiotropium (SPIRIVA) inhalation capsule ([facility] use ONLY) 18 mcg (has no administration in time range)  famotidine (PEPCID) tablet 40 mg (has no administration in time range)  FLUoxetine (PROZAC) capsule 80 mg (has no administration in time range)  fluticasone (FLONASE) 50 MCG/ACT nasal spray 2 spray (has no administration in time range)  lipase/protease/amylase (CREON) capsule 72,000 Units (has no administration in time range)  vitamin B-12 (CYANOCOBALAMIN) tablet 1,000 mcg (has no administration in time range)  pantoprazole (PROTONIX) EC tablet 40 mg (has no administration in time range)  montelukast (SINGULAIR) tablet 10 mg (has no administration in time range)  methocarbamol (ROBAXIN) tablet 1,000 mg (has no administration in time range)  enoxaparin (LOVENOX) injection 40 mg (has no administration in time range)  lactated ringers infusion (has no administration in time range)  acetaminophen (TYLENOL) tablet 650 mg (has no administration  in time range)    Or  acetaminophen (TYLENOL) suppository 650 mg (has no administration in time range)  polyethylene glycol (MIRALAX / GLYCOLAX) packet 17 g (has no administration in time range)  promethazine (PHENERGAN) tablet 12.5 mg (has no administration in time range)  lactated ringers bolus 1,000 mL (1,000 mLs Intravenous New Bag/Given 06/09/20 1613)  prochlorperazine (COMPAZINE) injection 10 mg (10 mg Intravenous Given 06/09/20 1614)   diphenhydrAMINE (BENADRYL) 12.5 MG/5ML elixir 12.5 mg (12.5 mg Oral Given 06/09/20 1614)  iohexol (OMNIPAQUE) 350 MG/ML injection 100 mL (100 mLs Intravenous Contrast Given 06/09/20 1740)     ED Discharge Orders    None       Note:  This document was prepared using Dragon voice recognition software and may include unintentional dictation errors.   Shaune Pollack, MD 06/09/20 [phone]

## 2020-06-09 NOTE — ED Notes (Signed)
Reports tachycardia while in grocery store today followed by nausea. States she does not feel well, fatigued.

## 2020-06-09 NOTE — ED Notes (Signed)
Pt unable to urinate at this time. Pt stated "I feel like if I get up to pee that I will pass out." Pt advised not to try to stand up without a staff member being with her to assist her.

## 2020-06-09 NOTE — H&P (Signed)
History and Physical    Kelsey Bell ZOX:[id number] DOB: 11/26/1980 DOA: 06/09/2020  PCP: Titus MouldWhite, Elizabeth Burney, NP   Patient coming from: Home  I have personally briefly reviewed patient's [facility]  Chief Complaint: Palpitations and dizziness  HPI: Kelsey Bell is a 39 y.o. female with medical history significant of complex regional pain syndrome, chronic pancreatitis, G-tube , came with presyncopal episode.  Per patient she was at a grocery store when she started feeling little dizzy, she sat down for a little while and felt little better and went inside the grocery store.  Later she developed dizziness and think that she is about to pass out.  Associated with some nausea, blurry vision and palpitations.  She called her friend who is an EMT, they checked her heart rate and it was in 140s and brought her to ED. Patient has an history of chronic nausea and occasionally vomits due to chronic pancreatitis.  She has an history of chronic diarrhea, normally have 2-3 small bowel movements daily, had more frequent episodes yesterday. Patient uses Dilaudid 3-4 times daily, ran out of her meds for past few days, restarted yesterday and thinks that worsening of nausea and diarrhea is related to withdrawal.  No unusual diarrhea today.  Patient denies any headaches, persistent blurry vision, upper respiratory symptoms, chest pain or shortness of breath.  No fever or chills.  No sick contacts.  Appetite is normal.  No urinary symptoms.  Patient is unvaccinated  ED Course: On arrival she was afebrile, with heart rate around 120s, labs significant for troponin of 19, AST of 50, bicarb of 18, blood glucose of 132, BUN of 13 and creatinine of 1.02.  WBCs of 13.7, UA with proteinuria, 11-20 WBCs and rare bacteria, saturating 100% on room air. COVID-19 testing pending.  Review of Systems: As per HPI otherwise 10 point review of systems negative.   Past Medical History:   Diagnosis Date  . Allergy   . Anxiety   . Arthritis   . Asthma   . Breast pain present for several months   Bil LT >RT across of breasts  . CRPS (complex regional pain syndrome type I)   . Depression   . DVT (deep venous thrombosis) (HCC) 06/2018  . Fibromyalgia   . Kidney stones   . LGSIL on Pap smear of cervix 2007  . Migraine   . Neuromuscular disorder (HCC)    Complex regional pain syndrome and Fibromyalgia  . Pancreatic pseudocyst   . Pancreatitis     Past Surgical History:  Procedure Laterality Date  . COLPOSCOPY  2017   neg bx  . ERCP    . EXTRACORPOREAL SHOCK WAVE LITHOTRIPSY Right 04/26/2017   Procedure: EXTRACORPOREAL SHOCK WAVE LITHOTRIPSY (ESWL);  Surgeon: Vanna ScotlandBrandon, Ashley, MD;  Location: [facility] ORS;  Service: Urology;  Laterality: Right;  . GASTROSTOMY-JEJEUNOSTOMY TUBE CHANGE/PLACEMENT    . KNEE ARTHROSCOPY Right   . nj placement    . TONSILLECTOMY    . UPPER ESOPHAGEAL ENDOSCOPIC ULTRASOUND (EUS)    . UPPER GASTROINTESTINAL ENDOSCOPY       reports that she has never smoked. She has never used smokeless tobacco. She reports that she does not drink alcohol and does not use drugs.  Allergies  Allergen Reactions  . Cefdinir Hives  . Esomeprazole Anaphylaxis  . Esomeprazole Magnesium Anaphylaxis  . Gabapentin Swelling  . Hydroxychloroquine Rash and Shortness Of Breath  . Tapentadol Other (See Comments)    Other  reaction(s): Other (See Comments) Body aches and pains, and itching. Swelling.   HEADACHES Body aches and pains, and itching. Swelling. Body aches and pains, and itching. Swelling.  . Sumatriptan Succinate Other (See Comments)  . Cortisone Other (See Comments)    pain pain  . Duloxetine Other (See Comments)    Hot flashed, sweating  . Prednisone Other (See Comments)    Muscle spasms  . Amoxicillin Rash    Has patient had a PCN reaction causing immediate rash, facial/tongue/throat swelling, SOB or lightheadedness with hypotension: Unknown Has  patient had a PCN reaction causing severe rash involving mucus membranes or skin necrosis: Unknown Has patient had a PCN reaction that required hospitalization: Unknown Has patient had a PCN reaction occurring within the last 10 years: Unknown If all of the above answers are "NO", then may proceed with Cephalosporin use.   Marland Kitchen Amoxicillin-Pot Clavulanate Nausea And Vomiting and Other (See Comments)    GI symptoms and UTI  . Morphine Itching and Nausea And Vomiting    IV morphine. Tolerates oral opioids.  . Reglan [Metoclopramide] Anxiety  . Sulfa Antibiotics Rash  . Sulfasalazine Rash    Family History  Adopted: Yes  Problem Relation Age of Onset  . Alcohol abuse Mother   . Depression Mother   . Drug abuse Mother   . Mental illness Mother   . Anxiety disorder Mother   . Alcohol abuse Father   . Arthritis Father   . Asthma Father   . COPD Father   . Depression Father   . Diabetes Father   . Drug abuse Father   . Early death Father   . Heart disease Father   . Hyperlipidemia Father   . Hypertension Father   . Kidney disease Father   . Stroke Father   . Diabetes Brother   . Depression Brother   . Kidney cancer Neg Hx   . Bladder Cancer Neg Hx     Prior to Admission medications   Medication Sig Start Date End Date Taking? Authorizing Provider  acetaminophen (TYLENOL) 500 MG tablet Take 1,000 mg by mouth every 8 (eight) hours as needed.    [provider]  albuterol (PROVENTIL HFA;VENTOLIN HFA) 108 (90 Base) MCG/ACT inhaler Inhale 1 puff into the lungs as needed for wheezing or shortness of breath.    Mertie Moores, MD  ALPRAZolam Prudy Feeler) 0.25 MG tablet Take 0.25 mg by mouth as needed for anxiety.    [provider]  budesonide-formoterol (SYMBICORT) 160-4.5 MCG/ACT inhaler Inhale 2 puffs into the lungs 2 (two) times daily. Using as needed    Mertie Moores, MD  dicyclomine (BENTYL) 20 MG tablet Take 10 mg by mouth 4 (four) times daily -  before meals  and at bedtime. Takes 20 mg    [provider]  famotidine (PEPCID) 20 MG tablet Take 40 mg by mouth 2 (two) times daily.     [provider]  FLUoxetine (PROZAC) 40 MG capsule Take 80 mg by mouth at bedtime.     [provider]  fluticasone (FLONASE) 50 MCG/ACT nasal spray Place 2 sprays into the nose daily.  06/07/17   [provider]  furosemide (LASIX) 20 MG tablet Take 20 mg by mouth daily. Using PRN    [provider]  HYDROmorphone (DILAUDID) 2 MG tablet Take 2 mg by mouth as directed. Patient is taking 2 mg three times daily as needed and 4 mg nightly    [provider]  ibuprofen (ADVIL) 800 MG tablet Take 800 mg by mouth 2 (two) times daily as needed for moderate pain.    [provider]  lidocaine (LIDODERM) 5 % Place 1 patch onto the skin daily. Remove & Discard patch within 12 hours or as directed by MD    [provider]  lipase/protease/amylase (CREON) 36000 UNITS CPEP capsule Take 2 capsules by mouth as directed. 2 capsules prior to tube feeds, 2 capsules during tube feeds, and 2 capsules after tube feeds 08/20/18   [provider]  meloxicam (MOBIC) 15 MG tablet Take 15 mg by mouth daily.    [provider]  methocarbamol (ROBAXIN) 500 MG tablet Take 1,000 mg by mouth 4 (four) times daily.    [provider]  montelukast (SINGULAIR) 10 MG tablet Take 10 mg by mouth at bedtime.    [provider]  Multiple Vitamin (MULTI-VITAMINS) TABS Take 1 tablet by mouth daily.    [provider]  naloxone [facility]) nasal spray 4 mg/0.1 mL Place 1 spray into the nose as needed (Opioid overdose).    [provider]  ondansetron (ZOFRAN) 8 MG tablet Take 1 tablet (8 mg total) by mouth every 8 (eight) hours as needed for nausea or vomiting. 12/05/17   Joni Reining, PA-C  pantoprazole (PROTONIX) 40 MG tablet Take 40 mg by mouth 2 (two) times daily.    [provider]   prochlorperazine (COMPAZINE) 5 MG tablet Take 5 mg by mouth every 6 (six) hours as needed for nausea or vomiting.    [provider]  promethazine (PHENERGAN) 12.5 MG tablet Take 12.5-25 mg by mouth every 6 (six) hours as needed for nausea or vomiting.    [provider]  sodium chloride (OCEAN) 0.65 % SOLN nasal spray Place 1 spray into both nostrils at bedtime.    [provider]  Tiotropium Bromide Monohydrate (SPIRIVA RESPIMAT) 1.25 MCG/ACT AERS Inhale 2 puffs into the lungs daily. 01/14/19 01/14/20  [provider]  topiramate (TOPAMAX) 50 MG tablet Take 250 mg by mouth daily.    [provider]  vitamin B-12 (CYANOCOBALAMIN) 1000 MCG tablet Take 1,000 mcg by mouth daily.    [provider]    Physical Exam: Vitals:   06/09/20 1344 06/09/20 1345 06/09/20 1500 06/09/20 1535  BP: 135/77  (!) 143/73   Pulse: (!) 122  (!) 116   Resp: 18  18   Temp: 98.3 F (36.8 C)     TempSrc: Oral     SpO2: 97%  100%   Weight:  127 kg  127 kg  Height:  5\' 7"  (1.702 m)  5\' 7"  (1.702 m)    General: Vital signs reviewed.  Patient is well-developed and well-nourished, in no acute distress and cooperative with exam.  Head: Normocephalic and atraumatic. Eyes: EOMI, conjunctivae normal, no scleral icterus.  ENMT: Mucous membranes are dry Neck: Supple, trachea midline, normal ROM,  Cardiovascular: RRR, S1 normal, S2 normal, no murmurs, gallops, or rubs. Pulmonary/Chest: Clear to auscultation bilaterally, no wheezes, rales, or rhonchi. Abdominal: Soft, non-tender, non-distended, BS +, Extremities: No lower extremity edema bilaterally,  pulses symmetric and intact bilaterally. No cyanosis or clubbing. Neurological: A&O x3, Strength is normal and symmetric bilaterally, cranial nerve II-XII are grossly intact, no focal motor deficit, sensory intact to light touch bilaterally.  Skin: Warm, dry and intact.  Psychiatric: Normal mood and affect. speech and  behavior is normal. Cognition and memory are normal.   Labs on Admission:  I have personally reviewed following labs and imaging studies  CBC: Recent Labs  Lab 06/09/20 1357  WBC 13.7*  HGB 13.4  HCT 39.8  MCV 89.6  PLT 355   Basic Metabolic Panel: Recent Labs  Lab 06/09/20 1357 06/09/20 1528  NA 138  --   K 3.8  --   CL 107  --   CO2 18*  --   GLUCOSE 132*  --   BUN 13  --   CREATININE 1.02*  --   CALCIUM 9.2  --   MG  --  2.0   GFR: Estimated Creatinine Clearance: 102.6 mL/min (A) (by C-G formula based on SCr of 1.02 mg/dL (H)). Liver Function Tests: Recent Labs  Lab 06/09/20 1528  AST 50*  ALT 24  ALKPHOS 81  BILITOT 1.0  PROT 7.8  ALBUMIN 4.1   Recent Labs  Lab 06/09/20 1528  LIPASE 23   No results for input(s): AMMONIA in the last 168 hours. Coagulation Profile: No results for input(s): INR, PROTIME in the last 168 hours. Cardiac Enzymes: No results for input(s): CKTOTAL, CKMB, CKMBINDEX, TROPONINI in the last 168 hours. BNP (last 3 results) No results for input(s): PROBNP in the last 8760 hours. HbA1C: No results for input(s): HGBA1C in the last 72 hours. CBG: No results for input(s): GLUCAP in the last 168 hours. Lipid Profile: No results for input(s): CHOL, HDL, LDLCALC, TRIG, CHOLHDL, LDLDIRECT in the last 72 hours. Thyroid Function Tests: No results for input(s): TSH, T4TOTAL, FREET4, T3FREE, THYROIDAB in the last 72 hours. Anemia Panel: No results for input(s): VITAMINB12, FOLATE, FERRITIN, TIBC, IRON, RETICCTPCT in the last 72 hours. Urine analysis:    Component Value Date/Time   COLORURINE AMBER (A) 06/09/2020 1357   APPEARANCEUR CLOUDY (A) 06/09/2020 1357   APPEARANCEUR Clear 05/10/2017 1353   LABSPEC 1.021 06/09/2020 1357   LABSPEC 1.026 09/03/2014 2035   PHURINE 5.0 06/09/2020 1357   GLUCOSEU NEGATIVE 06/09/2020 1357   GLUCOSEU Negative 09/03/2014 2035   HGBUR NEGATIVE 06/09/2020 1357   BILIRUBINUR NEGATIVE 06/09/2020 1357    BILIRUBINUR Negative 05/10/2017 1353   BILIRUBINUR Negative 09/03/2014 2035   KETONESUR NEGATIVE 06/09/2020 1357   PROTEINUR 100 (A) 06/09/2020 1357   NITRITE NEGATIVE 06/09/2020 1357   LEUKOCYTESUR NEGATIVE 06/09/2020 1357   LEUKOCYTESUR Negative 09/03/2014 2035    Radiological Exams on Admission: No results found.  EKG: Independently reviewed.  Sinus tachycardia, no significant ST changes.  Assessment/Plan Active Problems:   Dehydration   Presyncope.  Patient appears little dry, may be secondary to dehydration. Patient never lost consciousness.  GI losses.  Labs consistent with some dehydration. -Give her some IV fluid. -Encourage p.o. hydration.  Chronic regional pain syndrome.  Patient follow-up at a pain clinic. -Continue home dose of Dilaudid.  Chronic pancreatitis. -Continue with Creon.  GERD.  Patient follow-up with GI. -Continue home dose of Pepcid and Protonix.   DVT prophylaxis: Lovenox Code Status: Full code Family Communication: Discussed with patient Disposition Plan: Home Consults called: None Admission status: Observation   Arnetha Courser MD [facility]  If 7PM-7AM, please contact night-coverage [email or web address]  06/09/2020, 5:44 PM   This record has been created using Conservation officer, historic buildings. Errors have been sought and corrected,but may not always be located. Such creation errors do not reflect on the standard of care.

## 2020-06-09 NOTE — ED Triage Notes (Signed)
First RN Note: Pt presents to ED via [facility] with c/o near syncope. Per EMS HR ST 130-150, 97% RA, 127-75. Per EMS pt A&O x4 at this time.   CBG 199

## 2020-06-10 DIAGNOSIS — I951 Orthostatic hypotension: Secondary | ICD-10-CM | POA: Diagnosis not present

## 2020-06-10 DIAGNOSIS — E860 Dehydration: Secondary | ICD-10-CM | POA: Diagnosis not present

## 2020-06-10 LAB — CBC
HCT: 33.2 % — ABNORMAL LOW (ref 36.0–46.0)
Hemoglobin: 10.8 g/dL — ABNORMAL LOW (ref 12.0–15.0)
MCH: 30.5 pg (ref 26.0–34.0)
MCHC: 32.5 g/dL (ref 30.0–36.0)
MCV: 93.8 fL (ref 80.0–100.0)
Platelets: 272 10*3/uL (ref 150–400)
RBC: 3.54 MIL/uL — ABNORMAL LOW (ref 3.87–5.11)
RDW: 13.4 % (ref 11.5–15.5)
WBC: 10.2 10*3/uL (ref 4.0–10.5)
nRBC: 0 % (ref 0.0–0.2)

## 2020-06-10 LAB — BASIC METABOLIC PANEL
Anion gap: 7 (ref 5–15)
BUN: 14 mg/dL (ref 6–20)
CO2: 23 mmol/L (ref 22–32)
Calcium: 8.3 mg/dL — ABNORMAL LOW (ref 8.9–10.3)
Chloride: 107 mmol/L (ref 98–111)
Creatinine, Ser: 0.62 mg/dL (ref 0.44–1.00)
GFR calc non Af Amer: >60 mL/min (ref >60)
Glucose, Bld: 119 mg/dL — ABNORMAL HIGH (ref 70–99)
Potassium: 3.3 mmol/L — ABNORMAL LOW (ref 3.5–5.1)
Sodium: 137 mmol/L (ref 135–145)

## 2020-06-10 MED ORDER — POTASSIUM CHLORIDE CRYS ER 20 MEQ PO TBCR
40.0000 meq | EXTENDED_RELEASE_TABLET | Freq: Once | ORAL | Status: AC
  Administered 2020-06-10: 40 meq via ORAL
  Filled 2020-06-10: qty 2

## 2020-06-10 MED ORDER — MELATONIN 5 MG PO TABS
5.0000 mg | ORAL_TABLET | Freq: Every day | ORAL | Status: DC
  Administered 2020-06-10: 5 mg via ORAL
  Filled 2020-06-10 (×2): qty 1

## 2020-06-10 NOTE — Discharge Summary (Signed)
Physician Discharge Summary  Kelsey Bell DUK:[id number] DOB: [birth year] DOA: 06/09/2020  PCP: Ricardo Jericho, NP  Admit date: 06/09/2020 Discharge date: 06/10/2020  Admitted From: Home Disposition: Home  Recommendations for Outpatient Follow-up:  1. Follow up with PCP in 1-2 weeks 2. Please obtain BMP/CBC in one week 3. Please follow up on the following pending results: None  Home Health: No Equipment/Devices: None Discharge Condition: Stable CODE STATUS: Full Diet recommendation: Heart Healthy / Carb Modified   Brief/Interim Summary: Kelsey Bell is a 39 y.o. female with medical history significant of complex regional pain syndrome, chronic pancreatitis, G-tube , came with presyncopal episode.  Per patient she was at a grocery store when she started feeling little dizzy, she sat down for a little while and felt little better and went inside the grocery store.  Later she developed dizziness and think that she is about to pass out.  Associated with some nausea, blurry vision and palpitations.  She called her friend who is an EMT, they checked her heart rate and it was in 140s and brought her to ED. Patient has an history of chronic nausea and occasionally vomits due to chronic pancreatitis.  She has an history of chronic diarrhea, normally have 2-3 small bowel movements daily, had more frequent episodes yesterday. Patient uses Dilaudid 3-4 times daily, ran out of her meds for past few days, restarted yesterday and thinks that worsening of nausea and diarrhea is related to withdrawal.  No unusual diarrhea today.  Patient denies any headaches, persistent blurry vision, upper respiratory symptoms, chest pain or shortness of breath.  No fever or chills.  No sick contacts.  Appetite is normal.  No urinary symptoms.  Patient is unvaccinated.  On arrival she was little tachycardic with positive orthostatic vitals.  Appears dry.  CTA was also obtained for concern of tachycardia in the ED  and it was negative for PE.  Patient received some IV fluid with resolution of her symptoms.  Discharge home and she will follow up with her providers.  She will continue rest of her home meds.  Discharge Diagnoses:  Active Problems:   Dehydration   Discharge Instructions  Discharge Instructions    Diet - low sodium heart healthy   Complete by: As directed    Discharge instructions   Complete by: As directed    It was pleasure taking care of you. Please keep yourself well-hydrated and follow-up with your primary care provider within a week. Your blood pressure was little elevated but you also received some fluid in the hospital, keep checking your blood pressure while you are resting and relaxed at home and make a log, take that log to your primary care provider for further recommendations.   Increase activity slowly   Complete by: As directed      Allergies as of 06/10/2020      Reactions   Cefdinir Hives   Esomeprazole Anaphylaxis   Esomeprazole Magnesium Anaphylaxis   Gabapentin Swelling   Hydroxychloroquine Rash, Shortness Of Breath   Tapentadol Other (See Comments)   Other reaction(s): Other (See Comments) Body aches and pains, and itching. Swelling.   HEADACHES Body aches and pains, and itching. Swelling. Body aches and pains, and itching. Swelling.   Sumatriptan Succinate Other (See Comments)   Cortisone Other (See Comments)   pain pain   Duloxetine Other (See Comments)   Hot flashed, sweating   Prednisone Other (See Comments)   Muscle spasms   Amoxicillin Rash   Has  patient had a PCN reaction causing immediate rash, facial/tongue/throat swelling, SOB or lightheadedness with hypotension: Unknown Has patient had a PCN reaction causing severe rash involving mucus membranes or skin necrosis: Unknown Has patient had a PCN reaction that required hospitalization: Unknown Has patient had a PCN reaction occurring within the last 10 years: Unknown If all of the above  answers are "NO", then may proceed with Cephalosporin use.   Amoxicillin-pot Clavulanate Nausea And Vomiting, Other (See Comments)   GI symptoms and UTI   Morphine Itching, Nausea And Vomiting   IV morphine. Tolerates oral opioids.   Reglan [metoclopramide] Anxiety   Sulfa Antibiotics Rash   Sulfasalazine Rash      Medication List    STOP taking these medications   budesonide-formoterol 160-4.5 MCG/ACT inhaler Commonly known as: SYMBICORT   dicyclomine 20 MG tablet Commonly known as: BENTYL   ibuprofen 800 MG tablet Commonly known as: ADVIL   ondansetron 8 MG tablet Commonly known as: Zofran     TAKE these medications   acetaminophen 500 MG tablet Commonly known as: TYLENOL Take 1,000 mg by mouth every 8 (eight) hours as needed.   albuterol 108 (90 Base) MCG/ACT inhaler Commonly known as: VENTOLIN HFA Inhale 1 puff into the lungs as needed for wheezing or shortness of breath.   ALPRAZolam 0.25 MG tablet Commonly known as: XANAX Take 0.25 mg by mouth as needed for anxiety.   aspirin-acetaminophen-caffeine 250-250-65 MG tablet Commonly known as: EXCEDRIN MIGRAINE Take 1 tablet by mouth every 6 (six) hours as needed for headache or migraine.   famotidine 20 MG tablet Commonly known as: PEPCID Take 40 mg by mouth 2 (two) times daily.   FLUoxetine 40 MG capsule Commonly known as: PROZAC Take 80 mg by mouth at bedtime.   fluticasone 50 MCG/ACT nasal spray Commonly known as: FLONASE Place 2 sprays into the nose daily.   furosemide 20 MG tablet Commonly known as: LASIX Take 20 mg by mouth daily. Using PRN   HYDROmorphone 2 MG tablet Commonly known as: DILAUDID Take 2 mg by mouth as directed. Patient is taking 2 mg three times daily as needed and 4 mg nightly   lidocaine 5 % Commonly known as: LIDODERM Place 1 patch onto the skin daily. Remove & Discard patch within 12 hours or as directed by MD   lipase/protease/amylase 36000 UNITS Cpep capsule Commonly  known as: CREON Take 2 capsules by mouth as directed. 2 capsules prior to tube feeds, 2 capsules during tube feeds, and 2 capsules after tube feeds   Melatonin 5 MG Chew Chew 10 mg by mouth at bedtime.   meloxicam 7.5 MG tablet Commonly known as: MOBIC Take 7.5 mg by mouth daily.   methocarbamol 500 MG tablet Commonly known as: ROBAXIN Take 1,000 mg by mouth 4 (four) times daily.   montelukast 10 MG tablet Commonly known as: SINGULAIR Take 10 mg by mouth at bedtime.   Multi-Vitamins Tabs Take 1 tablet by mouth daily.   naloxone 4 MG/0.1ML Liqd nasal spray kit Commonly known as: NARCAN Place 1 spray into the nose as needed (Opioid overdose).   pantoprazole 40 MG tablet Commonly known as: PROTONIX Take 40 mg by mouth 2 (two) times daily.   prochlorperazine 5 MG tablet Commonly known as: COMPAZINE Take 5 mg by mouth every 6 (six) hours as needed for nausea or vomiting.   promethazine 25 MG tablet Commonly known as: PHENERGAN Take 25 mg by mouth every 6 (six) hours as needed.  sodium chloride 0.65 % Soln nasal spray Commonly known as: OCEAN Place 1 spray into both nostrils at bedtime.   Spiriva Respimat 1.25 MCG/ACT Aers Generic drug: Tiotropium Bromide Monohydrate Inhale 2 puffs into the lungs daily.   topiramate 50 MG tablet Commonly known as: TOPAMAX Take 250 mg by mouth daily.   vitamin B-12 1000 MCG tablet Commonly known as: CYANOCOBALAMIN Take 1,000 mcg by mouth daily.       Follow-up Information    Ricardo Jericho, NP. Schedule an appointment as soon as possible for a visit.   Specialty: Family Medicine Contact information: [place] [address] [phone]              Allergies  Allergen Reactions  . Cefdinir Hives  . Esomeprazole Anaphylaxis  . Esomeprazole Magnesium Anaphylaxis  . Gabapentin Swelling  . Hydroxychloroquine Rash and Shortness Of Breath  . Tapentadol Other (See Comments)    Other reaction(s):  Other (See Comments) Body aches and pains, and itching. Swelling.   HEADACHES Body aches and pains, and itching. Swelling. Body aches and pains, and itching. Swelling.  . Sumatriptan Succinate Other (See Comments)  . Cortisone Other (See Comments)    pain pain  . Duloxetine Other (See Comments)    Hot flashed, sweating  . Prednisone Other (See Comments)    Muscle spasms  . Amoxicillin Rash    Has patient had a PCN reaction causing immediate rash, facial/tongue/throat swelling, SOB or lightheadedness with hypotension: Unknown Has patient had a PCN reaction causing severe rash involving mucus membranes or skin necrosis: Unknown Has patient had a PCN reaction that required hospitalization: Unknown Has patient had a PCN reaction occurring within the last 10 years: Unknown If all of the above answers are "NO", then may proceed with Cephalosporin use.   Marland Kitchen Amoxicillin-Pot Clavulanate Nausea And Vomiting and Other (See Comments)    GI symptoms and UTI  . Morphine Itching and Nausea And Vomiting    IV morphine. Tolerates oral opioids.  . Reglan [Metoclopramide] Anxiety  . Sulfa Antibiotics Rash  . Sulfasalazine Rash    Consultations:  None  Procedures/Studies: CT Angio Chest PE W and/or Wo Contrast  Result Date: 06/09/2020 CLINICAL DATA:  PE suspected, high prob Near syncope. EXAM: CT ANGIOGRAPHY CHEST WITH CONTRAST TECHNIQUE: Multidetector CT imaging of the chest was performed using the standard protocol during bolus administration of intravenous contrast. Multiplanar CT image reconstructions and MIPs were obtained to evaluate the vascular anatomy. CONTRAST:  144m OMNIPAQUE IOHEXOL 350 MG/ML SOLN COMPARISON:  No recent exams. FINDINGS: Cardiovascular: Examination is limited by breathing motion artifact and contrast bolus timing. Evaluation is diagnostic to the segmental level. There are no filling defects in the pulmonary arteries to the segmental level to suggest pulmonary embolus.  Subsegmental branches are not well assessed. Normal caliber thoracic aorta. No aortic dissection or acute findings. Upper normal heart size. No pericardial effusion. Mediastinum/Nodes: No enlarged mediastinal or hilar lymph nodes. No visualized thyroid nodule. Slightly patulous esophagus with tiny hiatal hernia. There is no axillary adenopathy. Lungs/Pleura: Mild heterogeneous pulmonary parenchyma. No focal airspace disease. No septal thickening. No pleural fluid. No pulmonary mass or dominant nodule. Trachea and central bronchi are patent. Upper Abdomen: Enlarged liver with steatosis partially included. Catheter within small bowel in the left upper quadrant is partially included. Musculoskeletal: There are no acute or suspicious osseous abnormalities. Review of the MIP images confirms the above findings. IMPRESSION: 1. No pulmonary embolus to the segmental level. 2. Mild  heterogeneous pulmonary parenchyma, can be seen with small airways disease. 3. Borderline cardiomegaly. Electronically Signed   By: Keith Rake M.D.   On: 06/09/2020 18:27     Subjective: Patient was feeling better and back to her baseline when seen today.  Ready to go home.  Discharge Exam: Vitals:   06/10/20 0833 06/10/20 0836  BP: (!) 153/127   Pulse: 84   Resp:    Temp:  97.8 F (36.6 C)  SpO2: 96%    Vitals:   06/09/20 1923 06/10/20 0555 06/10/20 0833 06/10/20 0836  BP: 126/65 (!) 141/87 (!) 153/127   Pulse: 93 86 84   Resp: 18 20    Temp: 98 F (36.7 C) 98 F (36.7 C)  97.8 F (36.6 C)  TempSrc: Oral Oral  Oral  SpO2: 97% 98% 96%   Weight:      Height:        General: Pt is alert, awake, not in acute distress Cardiovascular: RRR, S1/S2 +, no rubs, no gallops Respiratory: CTA bilaterally, no wheezing, no rhonchi Abdominal: Soft, NT, ND, bowel sounds + Extremities: no edema, no cyanosis   The results of significant diagnostics from this hospitalization (including imaging, microbiology, ancillary and  laboratory) are listed below for reference.    Microbiology: Recent Results (from the past 240 hour(s))  Respiratory Panel by RT PCR (Flu A&B, Covid) - Nasopharyngeal Swab     Status: None   Collection Time: 06/09/20  6:25 PM   Specimen: Nasopharyngeal Swab  Result Value Ref Range Status   SARS Coronavirus 2 by RT PCR NEGATIVE NEGATIVE Final    Comment: (NOTE) SARS-CoV-2 target nucleic acids are NOT DETECTED.  The SARS-CoV-2 RNA is generally detectable in upper respiratoy specimens during the acute phase of infection. The lowest concentration of SARS-CoV-2 viral copies this assay can detect is 131 copies/mL. A negative result does not preclude SARS-Cov-2 infection and should not be used as the sole basis for treatment or other patient management decisions. A negative result may occur with  improper specimen collection/handling, submission of specimen other than nasopharyngeal swab, presence of viral mutation(s) within the areas targeted by this assay, and inadequate number of viral copies (<131 copies/mL). A negative result must be combined with clinical observations, patient history, and epidemiological information. The expected result is Negative.  Fact Sheet for Patients:  PinkCheek.be  Fact Sheet for Healthcare Providers:  GravelBags.it  This test is no t yet approved or cleared by the [place] FDA and  has been authorized for detection and/or diagnosis of SARS-CoV-2 by FDA under an Emergency Use Authorization (EUA). This EUA will remain  in effect (meaning this test can be used) for the duration of the COVID-19 declaration under Section 564(b)(1) of the Act, 21 U.S.C. section 360bbb-3(b)(1), unless the authorization is terminated or revoked sooner.     Influenza A by PCR NEGATIVE NEGATIVE Final   Influenza B by PCR NEGATIVE NEGATIVE Final    Comment: (NOTE) The Xpert Xpress SARS-CoV-2/FLU/RSV assay is  intended as an aid in  the diagnosis of influenza from Nasopharyngeal swab specimens and  should not be used as a sole basis for treatment. Nasal washings and  aspirates are unacceptable for Xpert Xpress SARS-CoV-2/FLU/RSV  testing.  Fact Sheet for Patients: PinkCheek.be  Fact Sheet for Healthcare Providers: GravelBags.it  This test is not yet approved or cleared by the [place] FDA and  has been authorized for detection and/or diagnosis of SARS-CoV-2 by  FDA under an Emergency Use  Authorization (EUA). This EUA will remain  in effect (meaning this test can be used) for the duration of the  Covid-19 declaration under Section 564(b)(1) of the Act, 21  U.S.C. section 360bbb-3(b)(1), unless the authorization is  terminated or revoked. Performed at [facility], [address] [place], [place] [address]      Labs: BNP (last 3 results) No results for input(s): BNP in the last 8760 hours. Basic Metabolic Panel: Recent Labs  Lab 06/09/20 1357 06/09/20 1528 06/10/20 0553  NA 138  --  137  K 3.8  --  3.3*  CL 107  --  107  CO2 18*  --  23  GLUCOSE 132*  --  119*  BUN 13  --  14  CREATININE 1.02*  --  0.62  CALCIUM 9.2  --  8.3*  MG  --  2.0  --    Liver Function Tests: Recent Labs  Lab 06/09/20 1528  AST 50*  ALT 24  ALKPHOS 81  BILITOT 1.0  PROT 7.8  ALBUMIN 4.1   Recent Labs  Lab 06/09/20 1528  LIPASE 23   No results for input(s): AMMONIA in the last 168 hours. CBC: Recent Labs  Lab 06/09/20 1357 06/10/20 0553  WBC 13.7* 10.2  HGB 13.4 10.8*  HCT 39.8 33.2*  MCV 89.6 93.8  PLT 355 272   Cardiac Enzymes: No results for input(s): CKTOTAL, CKMB, CKMBINDEX, TROPONINI in the last 168 hours. BNP: Invalid input(s): POCBNP CBG: No results for input(s): GLUCAP in the last 168 hours. D-Dimer No results for input(s): DDIMER in the last 72 hours. Hgb A1c No results for input(s): HGBA1C  in the last 72 hours. Lipid Profile No results for input(s): CHOL, HDL, LDLCALC, TRIG, CHOLHDL, LDLDIRECT in the last 72 hours. Thyroid function studies No results for input(s): TSH, T4TOTAL, T3FREE, THYROIDAB in the last 72 hours.  Invalid input(s): FREET3 Anemia work up No results for input(s): VITAMINB12, FOLATE, FERRITIN, TIBC, IRON, RETICCTPCT in the last 72 hours. Urinalysis    Component Value Date/Time   COLORURINE AMBER (A) 06/09/2020 1357   APPEARANCEUR CLOUDY (A) 06/09/2020 1357   APPEARANCEUR Clear 05/10/2017 1353   LABSPEC 1.021 06/09/2020 1357   LABSPEC 1.026 09/03/2014 2035   PHURINE 5.0 06/09/2020 1357   GLUCOSEU NEGATIVE 06/09/2020 1357   GLUCOSEU Negative 09/03/2014 2035   HGBUR NEGATIVE 06/09/2020 1357   [place] 06/09/2020 1357   BILIRUBINUR Negative 05/10/2017 1353   BILIRUBINUR Negative 09/03/2014 2035   KETONESUR NEGATIVE 06/09/2020 1357   PROTEINUR 100 (A) 06/09/2020 1357   NITRITE NEGATIVE 06/09/2020 1357   LEUKOCYTESUR NEGATIVE 06/09/2020 1357   LEUKOCYTESUR Negative 09/03/2014 2035   Sepsis Labs Invalid input(s): PROCALCITONIN,  WBC,  LACTICIDVEN Microbiology Recent Results (from the past 240 hour(s))  Respiratory Panel by RT PCR (Flu A&B, Covid) - Nasopharyngeal Swab     Status: None   Collection Time: 06/09/20  6:25 PM   Specimen: Nasopharyngeal Swab  Result Value Ref Range Status   SARS Coronavirus 2 by RT PCR NEGATIVE NEGATIVE Final    Comment: (NOTE) SARS-CoV-2 target nucleic acids are NOT DETECTED.  The SARS-CoV-2 RNA is generally detectable in upper respiratoy specimens during the acute phase of infection. The lowest concentration of SARS-CoV-2 viral copies this assay can detect is 131 copies/mL. A negative result does not preclude SARS-Cov-2 infection and should not be used as the sole basis for treatment or other patient management decisions. A negative result may occur with  improper specimen collection/handling,  submission of specimen other than nasopharyngeal swab, presence of viral mutation(s) within the areas targeted by this assay, and inadequate number of viral copies (<131 copies/mL). A negative result must be combined with clinical observations, patient history, and epidemiological information. The expected result is Negative.  Fact Sheet for Patients:  PinkCheek.be  Fact Sheet for Healthcare Providers:  GravelBags.it  This test is no t yet approved or cleared by the [place] FDA and  has been authorized for detection and/or diagnosis of SARS-CoV-2 by FDA under an Emergency Use Authorization (EUA). This EUA will remain  in effect (meaning this test can be used) for the duration of the COVID-19 declaration under Section 564(b)(1) of the Act, 21 U.S.C. section 360bbb-3(b)(1), unless the authorization is terminated or revoked sooner.     Influenza A by PCR NEGATIVE NEGATIVE Final   Influenza B by PCR NEGATIVE NEGATIVE Final    Comment: (NOTE) The Xpert Xpress SARS-CoV-2/FLU/RSV assay is intended as an aid in  the diagnosis of influenza from Nasopharyngeal swab specimens and  should not be used as a sole basis for treatment. Nasal washings and  aspirates are unacceptable for Xpert Xpress SARS-CoV-2/FLU/RSV  testing.  Fact Sheet for Patients: PinkCheek.be  Fact Sheet for Healthcare Providers: GravelBags.it  This test is not yet approved or cleared by the [place] FDA and  has been authorized for detection and/or diagnosis of SARS-CoV-2 by  FDA under an Emergency Use Authorization (EUA). This EUA will remain  in effect (meaning this test can be used) for the duration of the  Covid-19 declaration under Section 564(b)(1) of the Act, 21  U.S.C. section 360bbb-3(b)(1), unless the authorization is  terminated or revoked. Performed at [facility], [address] [place], [place] [address]     Time coordinating discharge: Over 30 minutes  SIGNED:  Lorella Nimrod, MD  [facility] 06/10/2020, 9:36 AM  If 7PM-7AM, please contact night-coverage [email or web address]  This record has been created using Systems analyst. Errors have been sought and corrected,but may not always be located. Such creation errors do not reflect on the standard of care.

## 2020-06-10 NOTE — ED Notes (Signed)
Patient discharged home, patient received discharge papers. Patient got belongings and verbalized she has received all of her belongings. Patient appropriate and cooperative. Vital signs taken. NAD noted.

## 2020-10-26 ENCOUNTER — Encounter: Payer: Self-pay | Admitting: Physical Therapy

## 2020-10-26 ENCOUNTER — Other Ambulatory Visit: Payer: Self-pay

## 2020-10-26 ENCOUNTER — Ambulatory Visit: Payer: Medicaid Other | Attending: Family Medicine | Admitting: Physical Therapy

## 2020-10-26 DIAGNOSIS — M5441 Lumbago with sciatica, right side: Secondary | ICD-10-CM | POA: Diagnosis present

## 2020-10-26 DIAGNOSIS — M6281 Muscle weakness (generalized): Secondary | ICD-10-CM | POA: Insufficient documentation

## 2020-10-26 DIAGNOSIS — M5442 Lumbago with sciatica, left side: Secondary | ICD-10-CM | POA: Insufficient documentation

## 2020-10-26 DIAGNOSIS — M2560 Stiffness of unspecified joint, not elsewhere classified: Secondary | ICD-10-CM | POA: Diagnosis present
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
## 2020-10-28 ENCOUNTER — Ambulatory Visit: Payer: Medicaid Other | Admitting: Physical Therapy

## 2020-11-02 ENCOUNTER — Ambulatory Visit: Payer: Medicaid Other | Attending: Family Medicine | Admitting: Physical Therapy

## 2020-11-02 ENCOUNTER — Other Ambulatory Visit: Payer: Self-pay

## 2020-11-02 DIAGNOSIS — M5441 Lumbago with sciatica, right side: Secondary | ICD-10-CM

## 2020-11-02 DIAGNOSIS — M2560 Stiffness of unspecified joint, not elsewhere classified: Secondary | ICD-10-CM

## 2020-11-02 DIAGNOSIS — M6281 Muscle weakness (generalized): Secondary | ICD-10-CM | POA: Diagnosis present

## 2020-11-02 DIAGNOSIS — M5442 Lumbago with sciatica, left side: Secondary | ICD-10-CM | POA: Insufficient documentation
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
## 2020-11-03 ENCOUNTER — Encounter: Payer: Self-pay | Admitting: Physical Therapy

## 2020-11-04 ENCOUNTER — Other Ambulatory Visit: Payer: Self-pay

## 2020-11-04 ENCOUNTER — Ambulatory Visit: Payer: Medicaid Other | Admitting: Physical Therapy

## 2020-11-04 DIAGNOSIS — M6281 Muscle weakness (generalized): Secondary | ICD-10-CM

## 2020-11-04 DIAGNOSIS — M2560 Stiffness of unspecified joint, not elsewhere classified: Secondary | ICD-10-CM

## 2020-11-04 DIAGNOSIS — M5441 Lumbago with sciatica, right side: Secondary | ICD-10-CM

## 2020-11-04 DIAGNOSIS — M5442 Lumbago with sciatica, left side: Secondary | ICD-10-CM | POA: Diagnosis not present
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
## 2020-11-05 ENCOUNTER — Encounter: Payer: Self-pay | Admitting: Physical Therapy

## 2020-11-08 ENCOUNTER — Encounter: Payer: Self-pay | Admitting: Physical Therapy

## 2020-11-08 ENCOUNTER — Other Ambulatory Visit: Payer: Self-pay

## 2020-11-08 ENCOUNTER — Ambulatory Visit: Payer: Medicaid Other | Admitting: Physical Therapy

## 2020-11-08 DIAGNOSIS — M5441 Lumbago with sciatica, right side: Secondary | ICD-10-CM

## 2020-11-08 DIAGNOSIS — M5442 Lumbago with sciatica, left side: Secondary | ICD-10-CM | POA: Diagnosis not present

## 2020-11-08 DIAGNOSIS — M2560 Stiffness of unspecified joint, not elsewhere classified: Secondary | ICD-10-CM

## 2020-11-08 DIAGNOSIS — M6281 Muscle weakness (generalized): Secondary | ICD-10-CM
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
## 2020-11-11 ENCOUNTER — Ambulatory Visit: Payer: Medicaid Other | Admitting: Physical Therapy

## 2020-11-11 ENCOUNTER — Other Ambulatory Visit: Payer: Self-pay

## 2020-11-11 DIAGNOSIS — M5441 Lumbago with sciatica, right side: Secondary | ICD-10-CM

## 2020-11-11 DIAGNOSIS — M2560 Stiffness of unspecified joint, not elsewhere classified: Secondary | ICD-10-CM

## 2020-11-11 DIAGNOSIS — M6281 Muscle weakness (generalized): Secondary | ICD-10-CM
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
## 2020-11-18 ENCOUNTER — Other Ambulatory Visit: Payer: Self-pay

## 2020-11-18 ENCOUNTER — Ambulatory Visit: Payer: Medicaid Other | Admitting: Physical Therapy

## 2020-11-18 DIAGNOSIS — M5442 Lumbago with sciatica, left side: Secondary | ICD-10-CM | POA: Diagnosis not present

## 2020-11-18 DIAGNOSIS — M6281 Muscle weakness (generalized): Secondary | ICD-10-CM

## 2020-11-18 DIAGNOSIS — M5441 Lumbago with sciatica, right side: Secondary | ICD-10-CM

## 2020-11-18 DIAGNOSIS — M2560 Stiffness of unspecified joint, not elsewhere classified: Secondary | ICD-10-CM

## 2020-11-23 ENCOUNTER — Encounter: Payer: Self-pay | Admitting: Physical Therapy
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
## 2020-11-25 ENCOUNTER — Ambulatory Visit: Payer: Medicaid Other | Admitting: Physical Therapy

## 2020-11-25 ENCOUNTER — Other Ambulatory Visit: Payer: Self-pay

## 2020-11-25 DIAGNOSIS — M2560 Stiffness of unspecified joint, not elsewhere classified: Secondary | ICD-10-CM

## 2020-11-25 DIAGNOSIS — M5441 Lumbago with sciatica, right side: Secondary | ICD-10-CM

## 2020-11-25 DIAGNOSIS — M6281 Muscle weakness (generalized): Secondary | ICD-10-CM

## 2020-11-25 DIAGNOSIS — M5442 Lumbago with sciatica, left side: Secondary | ICD-10-CM | POA: Diagnosis not present

## 2020-11-26 ENCOUNTER — Encounter: Payer: Self-pay | Admitting: Physical Therapy
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
## 2020-11-30 ENCOUNTER — Ambulatory Visit: Payer: Medicaid Other | Admitting: Physical Therapy

## 2020-11-30 ENCOUNTER — Encounter: Payer: Self-pay | Admitting: Physical Therapy

## 2020-11-30 DIAGNOSIS — M5441 Lumbago with sciatica, right side: Secondary | ICD-10-CM

## 2020-11-30 DIAGNOSIS — M2560 Stiffness of unspecified joint, not elsewhere classified: Secondary | ICD-10-CM

## 2020-11-30 DIAGNOSIS — M5442 Lumbago with sciatica, left side: Secondary | ICD-10-CM | POA: Diagnosis not present

## 2020-11-30 DIAGNOSIS — M6281 Muscle weakness (generalized): Secondary | ICD-10-CM

## 2020-11-30 NOTE — Therapy (Signed)
[place] [facility] [address] [place], [place], [address] Phone: [phone]   Fax:  [phone]  Physical Therapy Treatment  Patient Details  Name: Kelsey Bell MRN: [id number] Date of Birth: 05-30-1981 Referring Provider (PT): Marc Morgans, MD   Encounter Date: 11/30/2020    Treatment: 7 of 16.  Recert date: 12/21/20 1231 to 1311   Past Medical History:  Diagnosis Date  . Allergy   . Anxiety   . Arthritis   . Asthma   . Breast pain present for several months   Bil LT >RT across of breasts  . CRPS (complex regional pain syndrome type I)   . Depression   . DVT (deep venous thrombosis) (HCC) 06/2018  . Fibromyalgia   . Kidney stones   . LGSIL on Pap smear of cervix 2007  . Migraine   . Neuromuscular disorder (HCC)    Complex regional pain syndrome and Fibromyalgia  . Pancreatic pseudocyst   . Pancreatitis     Past Surgical History:  Procedure Laterality Date  . COLPOSCOPY  2017   neg bx  . ERCP    . EXTRACORPOREAL SHOCK WAVE LITHOTRIPSY Right 04/26/2017   Procedure: EXTRACORPOREAL SHOCK WAVE LITHOTRIPSY (ESWL);  Surgeon: Vanna Scotland, MD;  Location: [facility] ORS;  Service: Urology;  Laterality: Right;  . GASTROSTOMY-JEJEUNOSTOMY TUBE CHANGE/PLACEMENT    . KNEE ARTHROSCOPY Right   . nj placement    . TONSILLECTOMY    . UPPER ESOPHAGEAL ENDOSCOPIC ULTRASOUND (EUS)    . UPPER GASTROINTESTINAL ENDOSCOPY      There were no vitals filed for this visit.     Pt. reports 6/10 low back pain today. Pt. states pain is better today as compared to last tx. session. Pt. reports she is having Pancreas pain and more swollen today.     Therex:  No there.ex. today.  Discussed importance of HEP.    Manual tx.:  Supine LE/lumbar generalized stretches 7 min. MH to low back in prone position prior to STM/ mobs.    Prone position STM to upper thoracic/ lumbar paraspinals.   Prone grade II PA mobs. (unilateral) to  T10-L4 (2x 20 sec. Each) Use of Hypervolt on lumbar paraspinals/ sup. Glut. (decrease tenderness with Hypevolt as  Compared to STM)   No e-stim today.        PT Long Term Goals - 10/26/20 1547      PT LONG TERM GOAL #1   Title Pt will increase FOTO score to 47 to improve overall perceived functional ability.    Baseline IE: 22    Time 8    Period Weeks    Status New    Target Date 12/21/20      PT LONG TERM GOAL #2   Title Pt will increase strength of by at least 1/2 MMT grade in order to demonstrate improvement in strength and functio    Baseline IE: hip flexion 4/5 Bilaterally    Time 8    Period Weeks    Status New    Target Date 12/21/20      PT LONG TERM GOAL #3   Title Pt will increase HS flexibility to 70 to ease functional bending to dress and feed dogs    Baseline IE: R HS = 60 deg, L HS= 52 deg    Time 8    Period Weeks    Status New    Target Date 12/21/20      PT LONG TERM GOAL #  4   Title Pt will decrease worst back pain as reported on NPRS by at least 2 points in order to demonstrate clinically significant reduction in back pain.    Baseline IE: worst LBP 9/10    Time 8    Period Weeks    Status New    Target Date 12/21/20      PT LONG TERM GOAL #5   Title Pt will report being able to amb for 30 min with no increased pain to ease community amb.    Baseline IE: 5-10 min, limited by pain    Time 8    Period Weeks    Status New    Target Date 12/21/20             Pt. remains limited with low back pain and soft tissue tenderness with palpation during manual tx. Marked tenderness with PA mobs. to L3-5 region and over B SI joints. Pt. able to tolerate use of Hypervolt to low thoracic/ lumbar paraspinals in prone position after manual stretches. Pt. will continue to benefit from prone press-ups and skilled PT services to improve pain-free mobility. No e-stim used during tx. session.      Patient will benefit from skilled therapeutic intervention  in order to improve the following deficits and impairments:  Abnormal gait,Decreased strength,Increased muscle spasms,Postural dysfunction,Impaired perceived functional ability,Decreased activity tolerance,Decreased mobility,Impaired flexibility,Obesity,Decreased range of motion,Decreased balance,Pain,Decreased endurance  Visit Diagnosis: Acute bilateral low back pain with bilateral sciatica  Muscle weakness (generalized)  Joint stiffness     Problem List Patient Active Problem List   Diagnosis Date Noted  . Orthostatic syncope   . Dehydration 06/09/2020  . Genetic testing 10/24/2018  . Idiopathic acute pancreatitis 07/22/2018  . DVT (deep venous thrombosis) (HCC) 06/04/2018  . Acute pancreatitis 05/27/2018  . Right knee pain 05/19/2018  . Peripheral edema 03/05/2018  . Neuropathic pain 11/02/2017  . Kidney stone 04/25/2017  . Ureteral stone   . Migraines 12/15/2016  . Morbid obesity with BMI of 45.0-49.9, adult (HCC) 10/20/2015  . Asthma, moderate 05/21/2015  . Polyarthralgia 12/01/2011  . Fibromyalgia 09/18/2011  . Low back pain 09/18/2011   Cammie Mcgee, PT, DPT # [phone] 12/03/2020, 2:40 AM  [facility] [address] [place], [place], [address] Phone: [phone]   Fax:  [phone]  Name: Kelsey Bell MRN: [id number] Date of Birth: [birth year]

## 2020-12-02 ENCOUNTER — Ambulatory Visit: Payer: Medicaid Other | Admitting: Physical Therapy

## 2020-12-02 ENCOUNTER — Other Ambulatory Visit: Payer: Self-pay

## 2020-12-02 DIAGNOSIS — M2560 Stiffness of unspecified joint, not elsewhere classified: Secondary | ICD-10-CM

## 2020-12-02 DIAGNOSIS — M6281 Muscle weakness (generalized): Secondary | ICD-10-CM

## 2020-12-02 DIAGNOSIS — M5442 Lumbago with sciatica, left side: Secondary | ICD-10-CM | POA: Diagnosis not present

## 2020-12-02 DIAGNOSIS — M5441 Lumbago with sciatica, right side: Secondary | ICD-10-CM

## 2020-12-03 ENCOUNTER — Encounter: Payer: Self-pay | Admitting: Physical Therapy
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
## 2020-12-09 ENCOUNTER — Encounter: Payer: Self-pay | Admitting: Physical Therapy

## 2020-12-09 ENCOUNTER — Other Ambulatory Visit: Payer: Self-pay

## 2020-12-09 ENCOUNTER — Ambulatory Visit: Payer: Medicaid Other | Attending: Family Medicine | Admitting: Physical Therapy

## 2020-12-09 DIAGNOSIS — M5441 Lumbago with sciatica, right side: Secondary | ICD-10-CM

## 2020-12-09 DIAGNOSIS — M2560 Stiffness of unspecified joint, not elsewhere classified: Secondary | ICD-10-CM

## 2020-12-09 DIAGNOSIS — M6281 Muscle weakness (generalized): Secondary | ICD-10-CM | POA: Diagnosis present

## 2020-12-09 DIAGNOSIS — M5442 Lumbago with sciatica, left side: Secondary | ICD-10-CM | POA: Insufficient documentation
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
## 2020-12-20 ENCOUNTER — Ambulatory Visit: Payer: Medicaid Other | Admitting: Physical Therapy

## 2020-12-23 ENCOUNTER — Ambulatory Visit: Payer: Medicaid Other | Admitting: Physical Therapy

## 2020-12-29 ENCOUNTER — Ambulatory Visit: Payer: Medicaid Other | Admitting: Physical Therapy

## 2021-01-04 ENCOUNTER — Ambulatory Visit: Payer: Medicaid Other | Attending: Family Medicine | Admitting: Physical Therapy

## 2021-01-04 DIAGNOSIS — M5442 Lumbago with sciatica, left side: Secondary | ICD-10-CM | POA: Insufficient documentation

## 2021-01-04 DIAGNOSIS — M6281 Muscle weakness (generalized): Secondary | ICD-10-CM | POA: Insufficient documentation

## 2021-01-04 DIAGNOSIS — M5441 Lumbago with sciatica, right side: Secondary | ICD-10-CM | POA: Insufficient documentation

## 2021-01-04 DIAGNOSIS — M2560 Stiffness of unspecified joint, not elsewhere classified: Secondary | ICD-10-CM | POA: Insufficient documentation

## 2021-01-11 ENCOUNTER — Encounter: Payer: Self-pay | Admitting: Physical Therapy

## 2021-01-11 ENCOUNTER — Ambulatory Visit: Payer: Medicaid Other | Admitting: Physical Therapy

## 2021-01-11 DIAGNOSIS — M2560 Stiffness of unspecified joint, not elsewhere classified: Secondary | ICD-10-CM

## 2021-01-11 DIAGNOSIS — M5442 Lumbago with sciatica, left side: Secondary | ICD-10-CM | POA: Diagnosis present

## 2021-01-11 DIAGNOSIS — M6281 Muscle weakness (generalized): Secondary | ICD-10-CM | POA: Diagnosis present

## 2021-01-11 DIAGNOSIS — M5441 Lumbago with sciatica, right side: Secondary | ICD-10-CM

## 2021-01-11 NOTE — Therapy (Signed)
[facility] [address] [place], [place], [address] Phone: [phone]   Fax:  [phone]  Physical Therapy Treatment Physical Therapy Progress Note   Dates of reporting period  10/26/2020  to  01/11/2021  Patient Details  Name: Kelsey Bell MRN: [id number] Date of Birth: [birth year] Referring Provider (PT): Loann Quill, MD   Encounter Date: 01/11/2021   PT End of Session - 01/16/21 0827    Visit Number 10    Number of Visits 18    Date for PT Re-Evaluation 02/08/21    Authorization - Visit Number 10    Authorization - Number of Visits 10    PT Start Time 0904    PT Stop Time 0952    PT Time Calculation (min) 48 min    Activity Tolerance Patient limited by pain;Patient tolerated treatment well    Behavior During Therapy [facility] for tasks assessed/performed           Past Medical History:  Diagnosis Date  . Allergy   . Anxiety   . Arthritis   . Asthma   . Breast pain present for several months   Bil LT >RT across of breasts  . CRPS (complex regional pain syndrome type I)   . Depression   . DVT (deep venous thrombosis) ([place]) 06/2018  . Fibromyalgia   . Kidney stones   . LGSIL on Pap smear of cervix 2007  . Migraine   . Neuromuscular disorder (HCC)    Complex regional pain syndrome and Fibromyalgia  . Pancreatic pseudocyst   . Pancreatitis     Past Surgical History:  Procedure Laterality Date  . COLPOSCOPY  2017   neg bx  . ERCP    . EXTRACORPOREAL SHOCK WAVE LITHOTRIPSY Right 04/26/2017   Procedure: EXTRACORPOREAL SHOCK WAVE LITHOTRIPSY (ESWL);  Surgeon: Hollice Espy, MD;  Location: [facility] ORS;  Service: Urology;  Laterality: Right;  . GASTROSTOMY-JEJEUNOSTOMY TUBE CHANGE/PLACEMENT    . KNEE ARTHROSCOPY Right   . nj placement    . TONSILLECTOMY    . UPPER ESOPHAGEAL ENDOSCOPIC ULTRASOUND (EUS)    . UPPER GASTROINTESTINAL ENDOSCOPY      There were no vitals filed for this visit.       [facility]  PT Assessment - 01/16/21 0001      Assessment   Medical Diagnosis Acute midline thoracic back pain and acute midline low back pain with bilateral sciatica    Referring Provider (PT) Loann Quill, MD    Onset Date/Surgical Date 10/05/20    Prior Therapy yes      Prior Function   Level of Independence Independent      Cognition   Overall Cognitive Status Within Functional Limits for tasks assessed             Pt. has not been seen by PT over past month secondary to increase back pain/ frequent nausea.    Manual tx.:  Supine hamstring stretches L/R and nerve glides (as tolerated)- 4x each.   Supine lumbar rotn/ hip/ piriformis stretches 5x. MH to low back in prone position prior/duringSTM/ mobs.  Prone position STM to upper thoracic/ lumbar paraspinals.  Prone grade II PA mobs. (unilateral) to T5-L2(2x 20 sec. Each). Limited tolerance with lumbar mobs. Use of Hypervolt in prone position tomid-thoracic tolumbar paraspinals/ sup. Glut.  Supine marching/ bolster bridging/ SAQ 10x (discomfort in sacrum). Reassessment of lumbar AROM/ goals.   PT Long Term Goals - 01/16/21 2297      PT  LONG TERM GOAL #1   Title Pt will increase FOTO score to 47 to improve overall perceived functional ability.    Baseline IE: 22.  5/10: 56    Time 8    Period Weeks    Status Achieved    Target Date 01/11/21      PT LONG TERM GOAL #2   Title Pt will increase strength of by at least 1/2 MMT grade in order to demonstrate improvement in strength and functio    Baseline IE: hip flexion 4/5 Bilaterally    Time 4    Period Weeks    Status Not Met    Target Date 02/08/21      PT LONG TERM GOAL #3   Title Pt will increase HS flexibility to 70 to ease functional bending to dress and feed dogs    Baseline IE: R HS = 60 deg, L HS= 52 deg    Time 4    Period Weeks    Status Partially Met    Target Date 02/08/21      PT LONG TERM GOAL #4   Title Pt will decrease worst back  pain as reported on NPRS by at least 2 points in order to demonstrate clinically significant reduction in back pain.    Baseline IE: worst LBP 9/10    Time 4    Period Weeks    Status Not Met    Target Date 02/08/21      PT LONG TERM GOAL #5   Title Pt will report being able to amb for 30 min with no increased pain to ease community amb.    Baseline IE: 5-10 min, limited by pain    Time 4    Period Weeks    Status Not Met    Target Date 02/08/21           Pt continues to presents with low back pain and soft tissue tenderness with palpation over mid-thoracic/lumbar paraspinals during manual tx. Marked tenderness with PA mobs. to L3-5 region and over B SI joints. Pt. able to tolerate use of Hypervolt to low thoracic/ lumbar paraspinals in prone position after manual stretches. Pt. will continue to benefit from skilled PT services to improve pain-free mobility. No e-stim used during tx. session. Pt. instructed to contact MD about frequency of nausea, so PT can focus on strengthening/ pain-free mobility. See updated goals.     Patient will benefit from skilled therapeutic intervention in order to improve the following deficits and impairments:  Abnormal gait,Decreased strength,Increased muscle spasms,Postural dysfunction,Impaired perceived functional ability,Decreased activity tolerance,Decreased mobility,Impaired flexibility,Obesity,Decreased range of motion,Decreased balance,Pain,Decreased endurance  Visit Diagnosis: Acute bilateral low back pain with bilateral sciatica  Muscle weakness (generalized)  Joint stiffness     Problem List Patient Active Problem List   Diagnosis Date Noted  . Orthostatic syncope   . Dehydration 06/09/2020  . Genetic testing 10/24/2018  . Idiopathic acute pancreatitis 07/22/2018  . DVT (deep venous thrombosis) ([place]) 06/04/2018  . Acute pancreatitis 05/27/2018  . Right knee pain 05/19/2018  . Peripheral edema 03/05/2018  . Neuropathic pain  11/02/2017  . Kidney stone 04/25/2017  . Ureteral stone   . Migraines 12/15/2016  . Morbid obesity with BMI of 45.0-49.9, adult ([place]) 10/20/2015  . Asthma, moderate 05/21/2015  . Polyarthralgia 12/01/2011  . Fibromyalgia 09/18/2011  . Low back pain 09/18/2011   Pura Spice, PT, DPT # [phone] 01/16/2021, 8:41 AM  [facility]  [address] [place], [place], [address] Phone: [phone]   Fax:  [phone]  Name: Kelsey Bell MRN: [id number] Date of Birth: 1981-05-26

## 2021-01-13 ENCOUNTER — Ambulatory Visit: Payer: Medicaid Other | Admitting: Physical Therapy

## 2021-01-18 ENCOUNTER — Encounter: Payer: Self-pay | Admitting: Physical Therapy

## 2021-01-18 ENCOUNTER — Ambulatory Visit: Payer: Medicaid Other | Admitting: Physical Therapy

## 2021-01-18 ENCOUNTER — Other Ambulatory Visit: Payer: Self-pay

## 2021-01-18 DIAGNOSIS — M5442 Lumbago with sciatica, left side: Secondary | ICD-10-CM

## 2021-01-18 DIAGNOSIS — M5441 Lumbago with sciatica, right side: Secondary | ICD-10-CM

## 2021-01-18 DIAGNOSIS — M6281 Muscle weakness (generalized): Secondary | ICD-10-CM

## 2021-01-18 DIAGNOSIS — M2560 Stiffness of unspecified joint, not elsewhere classified: Secondary | ICD-10-CM
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
## 2021-01-26 ENCOUNTER — Ambulatory Visit: Payer: Medicaid Other | Admitting: Physical Therapy

## 2021-02-03 ENCOUNTER — Encounter: Payer: Medicaid Other | Admitting: Physical Therapy

## 2021-02-09 ENCOUNTER — Other Ambulatory Visit: Payer: Self-pay

## 2021-02-09 ENCOUNTER — Ambulatory Visit: Payer: Medicaid Other | Attending: Family Medicine | Admitting: Physical Therapy

## 2021-02-09 DIAGNOSIS — M5441 Lumbago with sciatica, right side: Secondary | ICD-10-CM | POA: Insufficient documentation

## 2021-02-09 DIAGNOSIS — M6281 Muscle weakness (generalized): Secondary | ICD-10-CM | POA: Diagnosis present

## 2021-02-09 DIAGNOSIS — M5442 Lumbago with sciatica, left side: Secondary | ICD-10-CM | POA: Diagnosis present

## 2021-02-09 DIAGNOSIS — M2560 Stiffness of unspecified joint, not elsewhere classified: Secondary | ICD-10-CM
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
## 2021-02-16 ENCOUNTER — Other Ambulatory Visit: Payer: Self-pay

## 2021-02-16 ENCOUNTER — Ambulatory Visit: Payer: Medicaid Other | Admitting: Physical Therapy

## 2021-02-16 ENCOUNTER — Encounter: Payer: Self-pay | Admitting: Physical Therapy

## 2021-02-16 DIAGNOSIS — M2560 Stiffness of unspecified joint, not elsewhere classified: Secondary | ICD-10-CM

## 2021-02-16 DIAGNOSIS — M5442 Lumbago with sciatica, left side: Secondary | ICD-10-CM

## 2021-02-16 DIAGNOSIS — M5441 Lumbago with sciatica, right side: Secondary | ICD-10-CM

## 2021-02-16 DIAGNOSIS — M6281 Muscle weakness (generalized): Secondary | ICD-10-CM

## 2021-02-16 NOTE — Therapy (Signed)
[facility] [address]. [place], [place], [address] Phone: [phone]   Fax:  [phone]  Physical Therapy Treatment  Patient Details  Name: Kelsey Bell MRN: [id number] Date of Birth: 10-12-1980 Referring Provider (PT): Loann Quill, MD   Encounter Date: 02/16/2021   Treatment: 13 of 20.  Recert date: 09/12/7586 3254 to 1539   Past Medical History:  Diagnosis Date   Allergy    Anxiety    Arthritis    Asthma    Breast pain present for several months   Bil LT >RT across of breasts   CRPS (complex regional pain syndrome type I)    Depression    DVT (deep venous thrombosis) ([place]) 06/2018   Fibromyalgia    Kidney stones    LGSIL on Pap smear of cervix 2007   Migraine    Neuromuscular disorder ([place])    Complex regional pain syndrome and Fibromyalgia   Pancreatic pseudocyst    Pancreatitis     Past Surgical History:  Procedure Laterality Date   COLPOSCOPY  2017   neg bx   ERCP     EXTRACORPOREAL SHOCK WAVE LITHOTRIPSY Right 04/26/2017   Procedure: EXTRACORPOREAL SHOCK WAVE LITHOTRIPSY (ESWL);  Surgeon: Hollice Espy, MD;  Location: [facility] ORS;  Service: Urology;  Laterality: Right;   GASTROSTOMY-JEJEUNOSTOMY TUBE CHANGE/PLACEMENT     KNEE ARTHROSCOPY Right    nj placement     TONSILLECTOMY     UPPER ESOPHAGEAL ENDOSCOPIC ULTRASOUND (EUS)     UPPER GASTROINTESTINAL ENDOSCOPY      There were no vitals filed for this visit.   Pt. received a Torodol shot this week and doing alright but had severe pain the other night while lying in bed. Pt. states the pain was severe and took a while to resolve. No subjective pain score given but persistent low back tenderness/ pain, esp. with palpation in prone position.       Pt reported to tx with increased LBP and bilat hip pain. Pt is uneasy with possible MD dx of type 3 diabetes. Pt has telehealth GI visit tomorrow.  Lumbar AROM in standing    Flexion: 10 deg   Ext: 15 deg R side bending: 20 with increase contralateral lumbar pain  L side bending: 30  L rotation: 25% reduction from normal, limited by pain.  R rotation: WNL   Discussed functional activity/ daily tasks/ pain (aggravating factors)   Manual tx.:  MH to back in varying position changes.     Prone STM to mid-thoracic to lumbar paraspinals/ grade II-III PA unilateral mobs. T8-L5 (as tolerated)- 1x20 sec. Use of Hypervolt to low back/ superior gluteal musculature Trigger point dry needling: 4 needles to lumbar trigger points on L/R.  Pistoning technique with good tolerance.  Minimal muscle fasciculations noted on R lumbar trigger pts. As compared to L.     No change to HEP.  Pt. Will contact PT if f/u MD appt.      PT Long Term Goals - 02/15/21 1929       PT LONG TERM GOAL #1   Title Pt will increase FOTO score to 47 to improve overall perceived functional ability.    Baseline IE: 22.  5/10: 56    Time 8    Period Weeks    Status Achieved    Target Date 01/11/21      PT LONG TERM GOAL #2   Title Pt will increase strength of by at least 1/2 MMT  grade in order to demonstrate improvement in strength and functio    Baseline IE: hip flexion 4/5 Bilaterally    Time 8    Period Weeks    Status Not Met    Target Date 04/05/21      PT LONG TERM GOAL #3   Title Pt will increase HS flexibility to 70 to ease functional bending to dress and feed dogs    Baseline IE: R HS = 60 deg, L HS= 52 deg    Time 8    Period Weeks    Status Partially Met    Target Date 04/05/21      PT LONG TERM GOAL #4   Title Pt will decrease worst back pain as reported on NPRS by at least 2 points in order to demonstrate clinically significant reduction in back pain.    Baseline IE: worst LBP 9/10    Time 8    Period Weeks    Status Not Met    Target Date 04/05/21      PT LONG TERM GOAL #5   Title Pt will report being able to amb for 30 min with no increased pain to ease community amb.     Baseline IE: 5-10 min, limited by pain    Time 8    Period Weeks    Status Not Met    Target Date 04/05/21             Pt. has significant c/o B low back/ superior glut/ piriformis tenderness with light palpation and use of Hypervolt while in prone position. Pt. tolerates trigger point dry needling/ addition of e-stim to low back better than STM. Lumbar AROM limited with flexion/ extension (see flowsheet).       Patient will benefit from skilled therapeutic intervention in order to improve the following deficits and impairments:  Abnormal gait, Decreased strength, Increased muscle spasms, Postural dysfunction, Impaired perceived functional ability, Decreased activity tolerance, Decreased mobility, Impaired flexibility, Obesity, Decreased range of motion, Decreased balance, Pain, Decreased endurance  Visit Diagnosis: Acute bilateral low back pain with bilateral sciatica  Muscle weakness (generalized)  Joint stiffness     Problem List Patient Active Problem List   Diagnosis Date Noted   Orthostatic syncope    Dehydration 06/09/2020   Genetic testing 10/24/2018   Idiopathic acute pancreatitis 07/22/2018   DVT (deep venous thrombosis) ([place]) 06/04/2018   Acute pancreatitis 05/27/2018   Right knee pain 05/19/2018   Peripheral edema 03/05/2018   Neuropathic pain 11/02/2017   Kidney stone 04/25/2017   Ureteral stone    Migraines 12/15/2016   Morbid obesity with BMI of 45.0-49.9, adult ([place]) 10/20/2015   Asthma, moderate 05/21/2015   Polyarthralgia 12/01/2011   Fibromyalgia 09/18/2011   Low back pain 09/18/2011   Pura Spice, PT, DPT # [id number] Shirley Friar, SPT 02/18/2021, 9:18 AM  [facility] [address] [place], [place], [address] Phone: [phone]   Fax:  [phone]  Name: Kelsey Bell MRN: [id number] Date of Birth: Jan 04, 1981

## 2021-02-23 ENCOUNTER — Encounter: Payer: Medicaid Other | Admitting: Physical Therapy

## 2021-02-24 ENCOUNTER — Other Ambulatory Visit: Payer: Self-pay

## 2021-02-24 ENCOUNTER — Ambulatory Visit: Payer: Medicaid Other | Admitting: Physical Therapy

## 2021-02-24 DIAGNOSIS — M5442 Lumbago with sciatica, left side: Secondary | ICD-10-CM | POA: Diagnosis not present

## 2021-02-24 DIAGNOSIS — M6281 Muscle weakness (generalized): Secondary | ICD-10-CM

## 2021-02-24 DIAGNOSIS — M2560 Stiffness of unspecified joint, not elsewhere classified: Secondary | ICD-10-CM

## 2021-02-24 DIAGNOSIS — M5441 Lumbago with sciatica, right side: Secondary | ICD-10-CM
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
## 2021-02-25 ENCOUNTER — Encounter: Payer: Self-pay | Admitting: Physical Therapy

## 2021-03-02 ENCOUNTER — Ambulatory Visit: Payer: Medicaid Other | Admitting: Physical Therapy

## 2021-03-10 ENCOUNTER — Ambulatory Visit: Payer: Medicaid Other | Attending: Family Medicine

## 2021-03-10 ENCOUNTER — Other Ambulatory Visit: Payer: Self-pay

## 2021-03-10 DIAGNOSIS — M5441 Lumbago with sciatica, right side: Secondary | ICD-10-CM | POA: Diagnosis present

## 2021-03-10 DIAGNOSIS — M5442 Lumbago with sciatica, left side: Secondary | ICD-10-CM | POA: Diagnosis present

## 2021-03-10 DIAGNOSIS — M6281 Muscle weakness (generalized): Secondary | ICD-10-CM | POA: Insufficient documentation

## 2021-03-10 DIAGNOSIS — M2560 Stiffness of unspecified joint, not elsewhere classified: Secondary | ICD-10-CM | POA: Diagnosis present
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
## 2021-03-16 ENCOUNTER — Other Ambulatory Visit: Payer: Self-pay

## 2021-03-16 ENCOUNTER — Encounter: Payer: Self-pay | Admitting: Physical Therapy

## 2021-03-16 ENCOUNTER — Ambulatory Visit: Payer: Medicaid Other | Admitting: Physical Therapy

## 2021-03-16 DIAGNOSIS — M5442 Lumbago with sciatica, left side: Secondary | ICD-10-CM

## 2021-03-16 DIAGNOSIS — M5441 Lumbago with sciatica, right side: Secondary | ICD-10-CM

## 2021-03-16 DIAGNOSIS — M2560 Stiffness of unspecified joint, not elsewhere classified: Secondary | ICD-10-CM

## 2021-03-16 DIAGNOSIS — M6281 Muscle weakness (generalized): Secondary | ICD-10-CM
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
## 2021-03-21 ENCOUNTER — Other Ambulatory Visit: Payer: Self-pay

## 2021-03-21 ENCOUNTER — Ambulatory Visit: Payer: Medicaid Other | Admitting: Physical Therapy

## 2021-03-21 ENCOUNTER — Encounter: Payer: Self-pay | Admitting: Physical Therapy

## 2021-03-21 DIAGNOSIS — M5442 Lumbago with sciatica, left side: Secondary | ICD-10-CM | POA: Diagnosis not present

## 2021-03-21 DIAGNOSIS — M5441 Lumbago with sciatica, right side: Secondary | ICD-10-CM

## 2021-03-21 DIAGNOSIS — M2560 Stiffness of unspecified joint, not elsewhere classified: Secondary | ICD-10-CM

## 2021-03-21 DIAGNOSIS — M6281 Muscle weakness (generalized): Secondary | ICD-10-CM
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
## 2021-03-31 ENCOUNTER — Encounter: Payer: Self-pay | Admitting: Physical Therapy

## 2021-03-31 ENCOUNTER — Other Ambulatory Visit: Payer: Self-pay

## 2021-03-31 ENCOUNTER — Ambulatory Visit: Payer: Medicaid Other | Admitting: Physical Therapy

## 2021-03-31 ENCOUNTER — Other Ambulatory Visit: Payer: Self-pay | Admitting: Family Medicine

## 2021-03-31 DIAGNOSIS — M5442 Lumbago with sciatica, left side: Secondary | ICD-10-CM

## 2021-03-31 DIAGNOSIS — M5441 Lumbago with sciatica, right side: Secondary | ICD-10-CM

## 2021-03-31 DIAGNOSIS — M6281 Muscle weakness (generalized): Secondary | ICD-10-CM

## 2021-03-31 DIAGNOSIS — M2560 Stiffness of unspecified joint, not elsewhere classified: Secondary | ICD-10-CM
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
## 2021-04-04 ENCOUNTER — Encounter: Payer: Self-pay | Admitting: Physical Therapy

## 2021-04-04 ENCOUNTER — Ambulatory Visit: Payer: Medicaid Other | Attending: Family Medicine | Admitting: Physical Therapy

## 2021-04-04 ENCOUNTER — Other Ambulatory Visit: Payer: Self-pay

## 2021-04-04 DIAGNOSIS — M2560 Stiffness of unspecified joint, not elsewhere classified: Secondary | ICD-10-CM | POA: Diagnosis present

## 2021-04-04 DIAGNOSIS — M5441 Lumbago with sciatica, right side: Secondary | ICD-10-CM | POA: Diagnosis present

## 2021-04-04 DIAGNOSIS — M6281 Muscle weakness (generalized): Secondary | ICD-10-CM | POA: Diagnosis present

## 2021-04-04 DIAGNOSIS — M5442 Lumbago with sciatica, left side: Secondary | ICD-10-CM | POA: Insufficient documentation
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
## 2021-04-20 ENCOUNTER — Other Ambulatory Visit: Payer: Self-pay | Admitting: Family Medicine

## 2021-04-20 DIAGNOSIS — Z1231 Encounter for screening mammogram for malignant neoplasm of breast: Secondary | ICD-10-CM

## 2021-04-26 ENCOUNTER — Other Ambulatory Visit: Payer: Self-pay

## 2021-04-26 ENCOUNTER — Ambulatory Visit
Admission: RE | Admit: 2021-04-26 | Discharge: 2021-04-26 | Disposition: A | Discharge status: Home / Self Care | Payer: Medicaid Other | Source: Ambulatory Visit | Attending: Family Medicine | Admitting: Family Medicine

## 2021-04-26 DIAGNOSIS — Z1231 Encounter for screening mammogram for malignant neoplasm of breast: Secondary | ICD-10-CM | POA: Insufficient documentation

## 2021-08-04 ENCOUNTER — Other Ambulatory Visit: Payer: Self-pay

## 2021-08-04 ENCOUNTER — Ambulatory Visit: Payer: Medicaid Other | Attending: Orthopedic Surgery | Admitting: Physical Therapy

## 2021-08-04 DIAGNOSIS — M25612 Stiffness of left shoulder, not elsewhere classified: Secondary | ICD-10-CM | POA: Diagnosis present

## 2021-08-04 DIAGNOSIS — M25572 Pain in left ankle and joints of left foot: Secondary | ICD-10-CM | POA: Diagnosis present

## 2021-08-04 DIAGNOSIS — M25672 Stiffness of left ankle, not elsewhere classified: Secondary | ICD-10-CM | POA: Diagnosis present

## 2021-08-04 DIAGNOSIS — M6281 Muscle weakness (generalized): Secondary | ICD-10-CM | POA: Diagnosis present

## 2021-08-04 DIAGNOSIS — M5441 Lumbago with sciatica, right side: Secondary | ICD-10-CM | POA: Diagnosis present

## 2021-08-04 DIAGNOSIS — M5442 Lumbago with sciatica, left side: Secondary | ICD-10-CM | POA: Insufficient documentation

## 2021-08-04 DIAGNOSIS — M2560 Stiffness of unspecified joint, not elsewhere classified: Secondary | ICD-10-CM | POA: Diagnosis present

## 2021-08-06 ENCOUNTER — Encounter: Payer: Self-pay | Admitting: Physical Therapy
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
## 2021-08-06 NOTE — Patient Instructions (Signed)
Access Code: [email or web address] URL: [email or web address] Date: 08/06/2021 Prepared by: Dorene Grebe  Exercises Ankle Alphabet in Elevation - 1 x daily - 7 x weekly - 1 sets - 5 reps Hooklying Single Knee to Chest Stretch - 1 x daily - 7 x weekly - 1 sets - 10 reps - 10 second hold Supine Lower Trunk Rotation - 1 x daily - 7 x weekly - 1 sets - 10 reps - 3-5 second hold Supine Shoulder Horizontal Abduction and Adduction - 1 x daily - 7 x weekly - 1 sets - 10 reps - 3-5 second hold Supine Shoulder Flexion Extension AAROM with Dowel - 1 x daily - 7 x weekly - 1 sets - 10 reps

## 2021-08-09 ENCOUNTER — Ambulatory Visit: Payer: Medicaid Other | Admitting: Physical Therapy

## 2021-08-11 ENCOUNTER — Ambulatory Visit: Payer: Medicaid Other | Admitting: Physical Therapy

## 2021-08-16 ENCOUNTER — Ambulatory Visit: Payer: Medicaid Other | Admitting: Physical Therapy

## 2021-08-16 ENCOUNTER — Other Ambulatory Visit: Payer: Self-pay

## 2021-08-16 DIAGNOSIS — M5442 Lumbago with sciatica, left side: Secondary | ICD-10-CM

## 2021-08-16 DIAGNOSIS — M2560 Stiffness of unspecified joint, not elsewhere classified: Secondary | ICD-10-CM

## 2021-08-16 DIAGNOSIS — M5441 Lumbago with sciatica, right side: Secondary | ICD-10-CM

## 2021-08-16 DIAGNOSIS — M25572 Pain in left ankle and joints of left foot: Secondary | ICD-10-CM | POA: Diagnosis not present

## 2021-08-16 DIAGNOSIS — M25672 Stiffness of left ankle, not elsewhere classified: Secondary | ICD-10-CM

## 2021-08-16 DIAGNOSIS — M25612 Stiffness of left shoulder, not elsewhere classified: Secondary | ICD-10-CM

## 2021-08-16 DIAGNOSIS — M6281 Muscle weakness (generalized): Secondary | ICD-10-CM

## 2021-08-18 ENCOUNTER — Other Ambulatory Visit: Payer: Self-pay

## 2021-08-18 ENCOUNTER — Ambulatory Visit: Payer: Medicaid Other | Admitting: Physical Therapy

## 2021-08-18 DIAGNOSIS — M25572 Pain in left ankle and joints of left foot: Secondary | ICD-10-CM | POA: Diagnosis not present

## 2021-08-18 DIAGNOSIS — M6281 Muscle weakness (generalized): Secondary | ICD-10-CM

## 2021-08-18 DIAGNOSIS — M5442 Lumbago with sciatica, left side: Secondary | ICD-10-CM

## 2021-08-18 DIAGNOSIS — M25672 Stiffness of left ankle, not elsewhere classified: Secondary | ICD-10-CM

## 2021-08-18 DIAGNOSIS — M5441 Lumbago with sciatica, right side: Secondary | ICD-10-CM

## 2021-08-18 DIAGNOSIS — M2560 Stiffness of unspecified joint, not elsewhere classified: Secondary | ICD-10-CM

## 2021-08-18 DIAGNOSIS — M25612 Stiffness of left shoulder, not elsewhere classified: Secondary | ICD-10-CM
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
## 2021-08-22 ENCOUNTER — Ambulatory Visit: Payer: Medicaid Other | Admitting: Physical Therapy

## 2021-08-22 ENCOUNTER — Other Ambulatory Visit: Payer: Self-pay

## 2021-08-22 DIAGNOSIS — M25672 Stiffness of left ankle, not elsewhere classified: Secondary | ICD-10-CM

## 2021-08-22 DIAGNOSIS — M5441 Lumbago with sciatica, right side: Secondary | ICD-10-CM

## 2021-08-22 DIAGNOSIS — M25572 Pain in left ankle and joints of left foot: Secondary | ICD-10-CM | POA: Diagnosis not present

## 2021-08-22 DIAGNOSIS — M5442 Lumbago with sciatica, left side: Secondary | ICD-10-CM

## 2021-08-22 DIAGNOSIS — M25612 Stiffness of left shoulder, not elsewhere classified: Secondary | ICD-10-CM

## 2021-08-22 DIAGNOSIS — M6281 Muscle weakness (generalized): Secondary | ICD-10-CM

## 2021-08-23 ENCOUNTER — Ambulatory Visit: Payer: Medicaid Other | Admitting: Physical Therapy

## 2021-08-24 ENCOUNTER — Ambulatory Visit: Payer: Medicaid Other | Admitting: Physical Therapy

## 2021-08-24 ENCOUNTER — Other Ambulatory Visit: Payer: Self-pay

## 2021-08-24 DIAGNOSIS — M6281 Muscle weakness (generalized): Secondary | ICD-10-CM

## 2021-08-24 DIAGNOSIS — M2560 Stiffness of unspecified joint, not elsewhere classified: Secondary | ICD-10-CM

## 2021-08-24 DIAGNOSIS — M25572 Pain in left ankle and joints of left foot: Secondary | ICD-10-CM

## 2021-08-24 DIAGNOSIS — M5441 Lumbago with sciatica, right side: Secondary | ICD-10-CM

## 2021-08-24 DIAGNOSIS — M25612 Stiffness of left shoulder, not elsewhere classified: Secondary | ICD-10-CM

## 2021-08-24 DIAGNOSIS — M25672 Stiffness of left ankle, not elsewhere classified: Secondary | ICD-10-CM
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
## 2021-08-25 ENCOUNTER — Encounter: Payer: Medicaid Other | Admitting: Physical Therapy

## 2021-08-30 ENCOUNTER — Encounter: Payer: Medicaid Other | Admitting: Physical Therapy

## 2021-09-01 ENCOUNTER — Ambulatory Visit: Payer: Medicaid Other | Admitting: Physical Therapy

## 2021-09-01 ENCOUNTER — Other Ambulatory Visit: Payer: Self-pay

## 2021-09-01 DIAGNOSIS — M25672 Stiffness of left ankle, not elsewhere classified: Secondary | ICD-10-CM

## 2021-09-01 DIAGNOSIS — M6281 Muscle weakness (generalized): Secondary | ICD-10-CM

## 2021-09-01 DIAGNOSIS — M25572 Pain in left ankle and joints of left foot: Secondary | ICD-10-CM | POA: Diagnosis not present

## 2021-09-01 DIAGNOSIS — M25612 Stiffness of left shoulder, not elsewhere classified: Secondary | ICD-10-CM

## 2021-09-01 DIAGNOSIS — M5442 Lumbago with sciatica, left side: Secondary | ICD-10-CM

## 2021-09-01 DIAGNOSIS — M2560 Stiffness of unspecified joint, not elsewhere classified: Secondary | ICD-10-CM

## 2021-09-01 DIAGNOSIS — M5441 Lumbago with sciatica, right side: Secondary | ICD-10-CM

## 2021-09-02 ENCOUNTER — Encounter: Payer: Self-pay | Admitting: Physical Therapy
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
## 2021-09-06 ENCOUNTER — Other Ambulatory Visit: Payer: Self-pay

## 2021-09-06 ENCOUNTER — Ambulatory Visit: Payer: Medicaid Other | Attending: Orthopedic Surgery | Admitting: Physical Therapy

## 2021-09-06 DIAGNOSIS — M5442 Lumbago with sciatica, left side: Secondary | ICD-10-CM | POA: Insufficient documentation

## 2021-09-06 DIAGNOSIS — M25572 Pain in left ankle and joints of left foot: Secondary | ICD-10-CM | POA: Insufficient documentation

## 2021-09-06 DIAGNOSIS — M2560 Stiffness of unspecified joint, not elsewhere classified: Secondary | ICD-10-CM | POA: Insufficient documentation

## 2021-09-06 DIAGNOSIS — M25612 Stiffness of left shoulder, not elsewhere classified: Secondary | ICD-10-CM | POA: Insufficient documentation

## 2021-09-06 DIAGNOSIS — M25672 Stiffness of left ankle, not elsewhere classified: Secondary | ICD-10-CM | POA: Insufficient documentation

## 2021-09-06 DIAGNOSIS — M5441 Lumbago with sciatica, right side: Secondary | ICD-10-CM | POA: Insufficient documentation

## 2021-09-06 DIAGNOSIS — M6281 Muscle weakness (generalized): Secondary | ICD-10-CM | POA: Diagnosis present

## 2021-09-08 ENCOUNTER — Ambulatory Visit: Payer: Medicaid Other | Admitting: Physical Therapy

## 2021-09-08 ENCOUNTER — Other Ambulatory Visit: Payer: Self-pay

## 2021-09-08 DIAGNOSIS — M25572 Pain in left ankle and joints of left foot: Secondary | ICD-10-CM

## 2021-09-08 DIAGNOSIS — M25672 Stiffness of left ankle, not elsewhere classified: Secondary | ICD-10-CM

## 2021-09-08 DIAGNOSIS — M5441 Lumbago with sciatica, right side: Secondary | ICD-10-CM

## 2021-09-08 DIAGNOSIS — M5442 Lumbago with sciatica, left side: Secondary | ICD-10-CM

## 2021-09-08 DIAGNOSIS — M25612 Stiffness of left shoulder, not elsewhere classified: Secondary | ICD-10-CM

## 2021-09-08 DIAGNOSIS — M6281 Muscle weakness (generalized): Secondary | ICD-10-CM

## 2021-09-09 ENCOUNTER — Encounter: Payer: Self-pay | Admitting: Physical Therapy
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
## 2021-09-15 ENCOUNTER — Other Ambulatory Visit: Payer: Self-pay

## 2021-09-15 ENCOUNTER — Ambulatory Visit: Payer: Medicaid Other | Admitting: Physical Therapy

## 2021-09-15 DIAGNOSIS — M5441 Lumbago with sciatica, right side: Secondary | ICD-10-CM

## 2021-09-15 DIAGNOSIS — M25572 Pain in left ankle and joints of left foot: Secondary | ICD-10-CM

## 2021-09-15 DIAGNOSIS — M5442 Lumbago with sciatica, left side: Secondary | ICD-10-CM

## 2021-09-15 DIAGNOSIS — M25672 Stiffness of left ankle, not elsewhere classified: Secondary | ICD-10-CM

## 2021-09-15 DIAGNOSIS — M6281 Muscle weakness (generalized): Secondary | ICD-10-CM

## 2021-09-15 DIAGNOSIS — M2560 Stiffness of unspecified joint, not elsewhere classified: Secondary | ICD-10-CM

## 2021-09-15 DIAGNOSIS — M25612 Stiffness of left shoulder, not elsewhere classified: Secondary | ICD-10-CM
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
## 2021-09-20 ENCOUNTER — Encounter: Payer: Self-pay | Admitting: Physical Therapy

## 2021-09-20 ENCOUNTER — Other Ambulatory Visit: Payer: Self-pay

## 2021-09-20 ENCOUNTER — Ambulatory Visit: Payer: Medicaid Other | Admitting: Physical Therapy

## 2021-09-20 DIAGNOSIS — M2560 Stiffness of unspecified joint, not elsewhere classified: Secondary | ICD-10-CM

## 2021-09-20 DIAGNOSIS — M25612 Stiffness of left shoulder, not elsewhere classified: Secondary | ICD-10-CM

## 2021-09-20 DIAGNOSIS — M25572 Pain in left ankle and joints of left foot: Secondary | ICD-10-CM

## 2021-09-20 DIAGNOSIS — M5441 Lumbago with sciatica, right side: Secondary | ICD-10-CM

## 2021-09-20 DIAGNOSIS — M6281 Muscle weakness (generalized): Secondary | ICD-10-CM
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
## 2021-09-22 ENCOUNTER — Other Ambulatory Visit: Payer: Self-pay

## 2021-09-22 ENCOUNTER — Encounter: Payer: Self-pay | Admitting: Physical Therapy

## 2021-09-22 ENCOUNTER — Ambulatory Visit: Payer: Medicaid Other | Admitting: Physical Therapy

## 2021-09-22 DIAGNOSIS — M5441 Lumbago with sciatica, right side: Secondary | ICD-10-CM

## 2021-09-22 DIAGNOSIS — M2560 Stiffness of unspecified joint, not elsewhere classified: Secondary | ICD-10-CM

## 2021-09-22 DIAGNOSIS — M25572 Pain in left ankle and joints of left foot: Secondary | ICD-10-CM | POA: Diagnosis not present

## 2021-09-22 DIAGNOSIS — M6281 Muscle weakness (generalized): Secondary | ICD-10-CM

## 2021-09-22 DIAGNOSIS — M5442 Lumbago with sciatica, left side: Secondary | ICD-10-CM

## 2021-09-22 DIAGNOSIS — M25612 Stiffness of left shoulder, not elsewhere classified: Secondary | ICD-10-CM
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
## 2021-09-22 NOTE — Patient Instructions (Signed)
° °  Exercises Seated Cervical Sidebending Stretch - 1 x daily - 4 x weekly - 3 sets - 3 reps - 30 hold Doorway Pec Stretch at 90 Degrees Abduction - 1 x daily - 4 x weekly - 3 sets - 3 reps - 30 hold Isometric Shoulder Extension at Wall - 1 x daily - 4 x weekly - 2 sets - 10 reps - 10 hold Isometric Shoulder Abduction at Wall - 1 x daily - 4 x weekly - 2 sets - 10 reps - 10 hold

## 2021-09-27 ENCOUNTER — Ambulatory Visit: Payer: Medicaid Other | Admitting: Physical Therapy

## 2021-09-29 ENCOUNTER — Ambulatory Visit: Payer: Medicaid Other | Admitting: Physical Therapy

## 2021-09-29 ENCOUNTER — Other Ambulatory Visit: Payer: Self-pay

## 2021-09-29 ENCOUNTER — Encounter: Payer: Self-pay | Admitting: Physical Therapy

## 2021-09-29 DIAGNOSIS — M2560 Stiffness of unspecified joint, not elsewhere classified: Secondary | ICD-10-CM

## 2021-09-29 DIAGNOSIS — M25572 Pain in left ankle and joints of left foot: Secondary | ICD-10-CM | POA: Diagnosis not present

## 2021-09-29 DIAGNOSIS — M25672 Stiffness of left ankle, not elsewhere classified: Secondary | ICD-10-CM

## 2021-09-29 DIAGNOSIS — M25612 Stiffness of left shoulder, not elsewhere classified: Secondary | ICD-10-CM

## 2021-09-29 NOTE — Patient Instructions (Signed)
Access Code: [email or web address] URL: [email or web address] Date: 09/29/2021 Prepared by: Dorene Grebe  Exercises Seated Cervical Sidebending Stretch - 1 x daily - 4 x weekly - 3 sets - 3 reps - 30 hold Doorway Pec Stretch at 90 Degrees Abduction - 1 x daily - 4 x weekly - 3 sets - 3 reps - 30 hold Isometric Shoulder Extension at Wall - 1 x daily - 4 x weekly - 2 sets - 10 reps - 10 hold Isometric Shoulder Abduction at Wall - 1 x daily - 4 x weekly - 2 sets - 10 reps - 10 hold Isometric Ankle Eversion with Self Resistance - 1 x daily - 4 x weekly - 3 sets - 10 reps - 10 hold Isometric Ankle Inversion - 1 x daily - 4 x weekly - 3 sets - 10 reps - 10 hold
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
## 2021-10-03 ENCOUNTER — Ambulatory Visit: Payer: Medicaid Other | Admitting: Physical Therapy

## 2021-10-03 ENCOUNTER — Other Ambulatory Visit: Payer: Self-pay

## 2021-10-03 ENCOUNTER — Encounter: Payer: Self-pay | Admitting: Physical Therapy

## 2021-10-03 DIAGNOSIS — M5441 Lumbago with sciatica, right side: Secondary | ICD-10-CM

## 2021-10-03 DIAGNOSIS — M25612 Stiffness of left shoulder, not elsewhere classified: Secondary | ICD-10-CM

## 2021-10-03 DIAGNOSIS — M2560 Stiffness of unspecified joint, not elsewhere classified: Secondary | ICD-10-CM

## 2021-10-03 DIAGNOSIS — M25572 Pain in left ankle and joints of left foot: Secondary | ICD-10-CM | POA: Diagnosis not present

## 2021-10-03 DIAGNOSIS — M5442 Lumbago with sciatica, left side: Secondary | ICD-10-CM

## 2021-10-03 NOTE — Therapy (Signed)
[place] [facility] [address] [place], [place], [address] Phone: [phone]   Fax:  [phone]  Physical Therapy Treatment  Patient Details  Name: Kelsey Bell MRN: [id number] Date of Birth: Jul 31, 1981 Referring Provider (PT): Dr. Anderson Malta   Encounter Date: 10/03/2021   PT End of Session - 10/03/21 0853     Visit Number 13    Number of Visits 15    Date for PT Re-Evaluation 10/04/21    Authorization - Visit Number 3    Authorization - Number of Visits 10    PT Start Time 0815    PT Stop Time 0913    PT Time Calculation (min) 58 min    Activity Tolerance Patient limited by pain;Patient tolerated treatment well    Behavior During Therapy [facility] for tasks assessed/performed             Past Medical History:  Diagnosis Date   Allergy    Anxiety    Arthritis    Asthma    Breast pain present for several months   Bil LT >RT across of breasts   CRPS (complex regional pain syndrome type I)    Depression    DVT (deep venous thrombosis) ([place]) 06/2018   Fibromyalgia    Kidney stones    LGSIL on Pap smear of cervix 2007   Migraine    Neuromuscular disorder ([place])    Complex regional pain syndrome and Fibromyalgia   Pancreatic pseudocyst    Pancreatitis     Past Surgical History:  Procedure Laterality Date   COLPOSCOPY  2017   neg bx   ERCP     EXTRACORPOREAL SHOCK WAVE LITHOTRIPSY Right 04/26/2017   Procedure: EXTRACORPOREAL SHOCK WAVE LITHOTRIPSY (ESWL);  Surgeon: Hollice Espy, MD;  Location: [facility] ORS;  Service: Urology;  Laterality: Right;   GASTROSTOMY-JEJEUNOSTOMY TUBE CHANGE/PLACEMENT     KNEE ARTHROSCOPY Right    nj placement     TONSILLECTOMY     UPPER ESOPHAGEAL ENDOSCOPIC ULTRASOUND (EUS)     UPPER GASTROINTESTINAL ENDOSCOPY      There were no vitals filed for this visit.    Ther.ex.:   Nustep L3 B UE/LE 6+ min. (Warm-up)- no issues in ankle but increase L sh. Discomfort.    Supine on table:  L shoulder isometric Extension/ER/ Abd. 5 reps 10 second hold   Supine L shoulder flexion AROM. Pt. Has pain but is able to move through pain free range.     Manual tx.:     Supine L shoulder LAD into PROM flexion/ abduction/ ER 3x each   Prone PA mobs. To thoracic spine as tolerated (central).  STM to cervical/ thoracic musculature in prone.  Prone Rib springing. Pt. As tolerated, notes some tenderness to the ribs during palpation.     Use of Hypervolt to thoracic/cervical paraspinal muscles in prone.        PT Long Term Goals - 09/20/21 1448       PT LONG TERM GOAL #1   Title Pt will increase FOTO score to 56 to improve overall perceived functional ability.    Baseline IE: 27.  1/3: 27 (no change)- pain limited    Time 4    Period Weeks    Status Not Met    Target Date 10/04/21      PT LONG TERM GOAL #2   Title Pt will increase ankle strength by at least 1/2 MMT grade in order to demonstrate improvement in  strength and functio    Baseline L ankle strength grossly 3+/5 MMT (pain limited).  R ankle strength 5/5 MMT.    Time 4    Period Weeks    Status Partially Met    Target Date 10/04/21      PT LONG TERM GOAL #3   Title Pt will increase L ankle AROM to [facility] as compared to R ankle to improve functional mobility.    Baseline L/R ankle AROM: DF (-5/11 deg.), PF (44/58 deg.), IV (12/30 deg.), EV (15/15 deg.).    Time 4    Period Weeks    Status Partially Met    Target Date 10/04/21      PT LONG TERM GOAL #4   Title Pt. will ambulate with normalized gait pattern and consistent heel strike with no increase c/o L ankle pain without use of ankle support/brace.    Baseline L antalgic gait pattern.  8/10 L ankle pain.    Time 4    Period Weeks    Status Partially Met    Target Date 10/04/21      PT LONG TERM GOAL #5   Title Pt. will increase L shoulder AROM to [facility] as compared to R shoulder to improve pain-free mobility/ ADL.    Baseline Supine L shoulder AROM: flexion  68 deg. (pain/guarded), abduction 66 deg. (pain), ER 32 deg., IR 85 deg.  Pt. unable to tolerate PROM to L shoulder at this time (>8/10 pain reported).  No MMT.    Time 4    Period Weeks    Status New    Target Date 10/04/21      PT LONG TERM GOAL #6   Title Pt. will increase FOTO to 58 to improve shoulder functional pain-free mobility.    Baseline Initial FOTO for shoulder: 43    Time 4    Period Weeks    Status On-going    Target Date 10/04/21                   Plan - 10/03/21 0915     Clinical Impression Statement Pt. demonstrates increase AROM of L shoulder during session and is not quite as pain limited with AAROM. Pt. benefits from inferior L shoulder PAMs as well as LAD to decrease pain while increasing ROM. Pt. states HEP is helping with pain and strength at L shoulder and L ankle. Pt. has some thoracic and rib hypomobility that benefits from CPA and rib springing. PT will progress HEP and continue to work on AROM to increase Pt.s ability to perform functional ADLs.    Examination-Activity Limitations Bathing;Bed Mobility;Bend;Carry;Dressing;Hygiene/Grooming;Lift;Locomotion Level;Sit;Squat;Stairs;Toileting;Stand    Examination-Participation Restrictions Cleaning;Laundry;Meal Prep;Community Activity    Stability/Clinical Decision Making Evolving/Moderate complexity    Clinical Decision Making Moderate    Rehab Potential Fair    PT Frequency 2x / week    PT Duration 4 weeks    PT Treatment/Interventions ADLs/Self Care Home Management;Aquatic Therapy;Cryotherapy;Iontophoresis 61m/ml Dexamethasone;Patient/family education;Neuromuscular re-education;Balance training;Therapeutic exercise;Therapeutic activities;Functional mobility training;Stair training;Gait training;Manual techniques;Passive range of motion;Dry needling;Electrical Stimulation    PT Next Visit Plan Continue with L shoulder manual tx.  Check goals/ Recert next tx. session.    PT Home Exercise Plan [email or web address]    Consulted and Agree with Plan of Care Patient             Patient will benefit from skilled therapeutic intervention in order to improve the following deficits and impairments:  Abnormal gait, Decreased strength, Increased muscle spasms, Postural  dysfunction, Impaired perceived functional ability, Decreased activity tolerance, Decreased mobility, Impaired flexibility, Obesity, Decreased range of motion, Decreased balance, Pain, Decreased endurance, Difficulty walking, Increased edema  Visit Diagnosis: Shoulder joint stiffness, left  Joint stiffness  Acute bilateral low back pain with bilateral sciatica     Problem List Patient Active Problem List   Diagnosis Date Noted   Orthostatic syncope    Dehydration 06/09/2020   Genetic testing 10/24/2018   Idiopathic acute pancreatitis 07/22/2018   DVT (deep venous thrombosis) ([place]) 06/04/2018   Acute pancreatitis 05/27/2018   Right knee pain 05/19/2018   Peripheral edema 03/05/2018   Neuropathic pain 11/02/2017   Kidney stone 04/25/2017   Ureteral stone    Migraines 12/15/2016   Morbid obesity with BMI of 45.0-49.9, adult ([place]) 10/20/2015   Asthma, moderate 05/21/2015   Polyarthralgia 12/01/2011   Fibromyalgia 09/18/2011   Low back pain 09/18/2011   Pura Spice, PT, DPT # [id number] Cleopatra Cedar, SPT 10/03/2021, 12:42 PM  [place] [facility] [address] [place], [place], [address] Phone: [phone]   Fax:  [phone]  Name: ILITHYIA TITZER MRN: [id number] Date of Birth: [birth year]

## 2021-10-04 ENCOUNTER — Encounter: Payer: Medicaid Other | Admitting: Physical Therapy

## 2021-10-05 ENCOUNTER — Ambulatory Visit: Payer: Medicaid Other | Admitting: Physical Therapy

## 2021-10-11 ENCOUNTER — Encounter: Payer: Self-pay | Admitting: Physical Therapy

## 2021-10-11 ENCOUNTER — Ambulatory Visit: Payer: Medicaid Other | Attending: Orthopedic Surgery | Admitting: Physical Therapy

## 2021-10-11 ENCOUNTER — Other Ambulatory Visit: Payer: Self-pay

## 2021-10-11 DIAGNOSIS — M25572 Pain in left ankle and joints of left foot: Secondary | ICD-10-CM | POA: Insufficient documentation

## 2021-10-11 DIAGNOSIS — M5442 Lumbago with sciatica, left side: Secondary | ICD-10-CM | POA: Insufficient documentation

## 2021-10-11 DIAGNOSIS — M5441 Lumbago with sciatica, right side: Secondary | ICD-10-CM | POA: Insufficient documentation

## 2021-10-11 DIAGNOSIS — M25612 Stiffness of left shoulder, not elsewhere classified: Secondary | ICD-10-CM | POA: Diagnosis not present

## 2021-10-11 DIAGNOSIS — M25672 Stiffness of left ankle, not elsewhere classified: Secondary | ICD-10-CM | POA: Insufficient documentation
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
## 2021-10-13 ENCOUNTER — Ambulatory Visit: Payer: Medicaid Other | Admitting: Physical Therapy

## 2021-10-13 ENCOUNTER — Other Ambulatory Visit: Payer: Self-pay

## 2021-10-13 ENCOUNTER — Encounter: Payer: Self-pay | Admitting: Physical Therapy

## 2021-10-13 DIAGNOSIS — M25572 Pain in left ankle and joints of left foot: Secondary | ICD-10-CM

## 2021-10-13 DIAGNOSIS — M25612 Stiffness of left shoulder, not elsewhere classified: Secondary | ICD-10-CM

## 2021-10-13 DIAGNOSIS — M5441 Lumbago with sciatica, right side: Secondary | ICD-10-CM
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
## 2021-10-18 ENCOUNTER — Other Ambulatory Visit: Payer: Self-pay

## 2021-10-18 ENCOUNTER — Ambulatory Visit: Payer: Medicaid Other | Admitting: Physical Therapy

## 2021-10-18 ENCOUNTER — Encounter: Payer: Self-pay | Admitting: Physical Therapy

## 2021-10-18 DIAGNOSIS — M5441 Lumbago with sciatica, right side: Secondary | ICD-10-CM

## 2021-10-18 DIAGNOSIS — M25612 Stiffness of left shoulder, not elsewhere classified: Secondary | ICD-10-CM | POA: Diagnosis not present

## 2021-10-18 DIAGNOSIS — M5442 Lumbago with sciatica, left side: Secondary | ICD-10-CM

## 2021-10-18 DIAGNOSIS — M25572 Pain in left ankle and joints of left foot: Secondary | ICD-10-CM
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
## 2021-10-20 ENCOUNTER — Other Ambulatory Visit: Payer: Self-pay

## 2021-10-20 ENCOUNTER — Encounter: Payer: Self-pay | Admitting: Physical Therapy

## 2021-10-20 ENCOUNTER — Ambulatory Visit: Payer: Medicaid Other | Admitting: Physical Therapy

## 2021-10-20 DIAGNOSIS — M25572 Pain in left ankle and joints of left foot: Secondary | ICD-10-CM

## 2021-10-20 DIAGNOSIS — M25612 Stiffness of left shoulder, not elsewhere classified: Secondary | ICD-10-CM | POA: Diagnosis not present

## 2021-10-20 DIAGNOSIS — M5441 Lumbago with sciatica, right side: Secondary | ICD-10-CM
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
## 2021-10-25 ENCOUNTER — Ambulatory Visit: Payer: Medicaid Other | Admitting: Physical Therapy

## 2021-10-25 ENCOUNTER — Encounter: Payer: Self-pay | Admitting: Physical Therapy

## 2021-10-25 ENCOUNTER — Other Ambulatory Visit: Payer: Self-pay

## 2021-10-25 DIAGNOSIS — M5441 Lumbago with sciatica, right side: Secondary | ICD-10-CM

## 2021-10-25 DIAGNOSIS — M25672 Stiffness of left ankle, not elsewhere classified: Secondary | ICD-10-CM

## 2021-10-25 DIAGNOSIS — M5442 Lumbago with sciatica, left side: Secondary | ICD-10-CM

## 2021-10-25 DIAGNOSIS — M25612 Stiffness of left shoulder, not elsewhere classified: Secondary | ICD-10-CM

## 2021-10-25 DIAGNOSIS — M25572 Pain in left ankle and joints of left foot: Secondary | ICD-10-CM
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
## 2021-10-27 ENCOUNTER — Ambulatory Visit: Payer: Medicaid Other | Admitting: Physical Therapy

## 2021-11-01 ENCOUNTER — Other Ambulatory Visit: Payer: Self-pay

## 2021-11-01 ENCOUNTER — Ambulatory Visit: Payer: Medicaid Other | Admitting: Physical Therapy

## 2021-11-01 DIAGNOSIS — M25612 Stiffness of left shoulder, not elsewhere classified: Secondary | ICD-10-CM

## 2021-11-01 DIAGNOSIS — M5441 Lumbago with sciatica, right side: Secondary | ICD-10-CM

## 2021-11-01 DIAGNOSIS — M25572 Pain in left ankle and joints of left foot: Secondary | ICD-10-CM

## 2021-11-01 DIAGNOSIS — M5442 Lumbago with sciatica, left side: Secondary | ICD-10-CM
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
## 2021-11-03 ENCOUNTER — Encounter: Payer: Self-pay | Admitting: Physical Therapy

## 2021-11-03 ENCOUNTER — Ambulatory Visit: Payer: Medicare Other | Attending: Orthopedic Surgery | Admitting: Physical Therapy

## 2021-11-03 ENCOUNTER — Other Ambulatory Visit: Payer: Self-pay

## 2021-11-03 DIAGNOSIS — M25612 Stiffness of left shoulder, not elsewhere classified: Secondary | ICD-10-CM | POA: Insufficient documentation

## 2021-11-03 DIAGNOSIS — M6281 Muscle weakness (generalized): Secondary | ICD-10-CM | POA: Diagnosis present

## 2021-11-03 DIAGNOSIS — M25572 Pain in left ankle and joints of left foot: Secondary | ICD-10-CM | POA: Insufficient documentation

## 2021-11-03 DIAGNOSIS — M25672 Stiffness of left ankle, not elsewhere classified: Secondary | ICD-10-CM | POA: Insufficient documentation

## 2021-11-03 DIAGNOSIS — M5441 Lumbago with sciatica, right side: Secondary | ICD-10-CM | POA: Insufficient documentation

## 2021-11-03 DIAGNOSIS — M2560 Stiffness of unspecified joint, not elsewhere classified: Secondary | ICD-10-CM | POA: Diagnosis present

## 2021-11-03 DIAGNOSIS — M5442 Lumbago with sciatica, left side: Secondary | ICD-10-CM | POA: Insufficient documentation
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
## 2021-11-08 ENCOUNTER — Ambulatory Visit: Payer: Medicare Other | Admitting: Physical Therapy

## 2021-11-08 ENCOUNTER — Other Ambulatory Visit: Payer: Self-pay

## 2021-11-08 DIAGNOSIS — M5442 Lumbago with sciatica, left side: Secondary | ICD-10-CM

## 2021-11-08 DIAGNOSIS — M6281 Muscle weakness (generalized): Secondary | ICD-10-CM

## 2021-11-08 DIAGNOSIS — M25572 Pain in left ankle and joints of left foot: Secondary | ICD-10-CM

## 2021-11-08 DIAGNOSIS — M5441 Lumbago with sciatica, right side: Secondary | ICD-10-CM

## 2021-11-08 DIAGNOSIS — M25612 Stiffness of left shoulder, not elsewhere classified: Secondary | ICD-10-CM

## 2021-11-08 DIAGNOSIS — M25672 Stiffness of left ankle, not elsewhere classified: Secondary | ICD-10-CM
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
## 2021-11-10 ENCOUNTER — Other Ambulatory Visit: Payer: Self-pay

## 2021-11-10 ENCOUNTER — Ambulatory Visit: Payer: Medicare Other | Admitting: Physical Therapy

## 2021-11-10 DIAGNOSIS — M6281 Muscle weakness (generalized): Secondary | ICD-10-CM

## 2021-11-10 DIAGNOSIS — M25672 Stiffness of left ankle, not elsewhere classified: Secondary | ICD-10-CM

## 2021-11-10 DIAGNOSIS — M25572 Pain in left ankle and joints of left foot: Secondary | ICD-10-CM | POA: Diagnosis not present

## 2021-11-10 DIAGNOSIS — M5441 Lumbago with sciatica, right side: Secondary | ICD-10-CM

## 2021-11-10 DIAGNOSIS — M25612 Stiffness of left shoulder, not elsewhere classified: Secondary | ICD-10-CM
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
## 2021-11-15 ENCOUNTER — Ambulatory Visit: Payer: Medicare Other | Admitting: Physical Therapy

## 2021-11-17 ENCOUNTER — Encounter: Payer: Self-pay | Admitting: Physical Therapy

## 2021-11-17 ENCOUNTER — Other Ambulatory Visit: Payer: Self-pay

## 2021-11-17 ENCOUNTER — Ambulatory Visit: Payer: Medicare Other | Admitting: Physical Therapy

## 2021-11-17 DIAGNOSIS — M25572 Pain in left ankle and joints of left foot: Secondary | ICD-10-CM | POA: Diagnosis not present
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
## 2021-11-21 ENCOUNTER — Ambulatory Visit: Payer: Medicaid Other | Admitting: Physical Therapy

## 2021-11-30 ENCOUNTER — Other Ambulatory Visit: Payer: Self-pay

## 2021-11-30 ENCOUNTER — Encounter: Payer: Self-pay | Admitting: Physical Therapy

## 2021-11-30 ENCOUNTER — Ambulatory Visit: Payer: Medicare Other | Admitting: Physical Therapy

## 2021-11-30 DIAGNOSIS — M2560 Stiffness of unspecified joint, not elsewhere classified: Secondary | ICD-10-CM

## 2021-11-30 DIAGNOSIS — M25572 Pain in left ankle and joints of left foot: Secondary | ICD-10-CM

## 2021-11-30 DIAGNOSIS — M25672 Stiffness of left ankle, not elsewhere classified: Secondary | ICD-10-CM

## 2021-11-30 DIAGNOSIS — M5441 Lumbago with sciatica, right side: Secondary | ICD-10-CM

## 2021-11-30 DIAGNOSIS — M25612 Stiffness of left shoulder, not elsewhere classified: Secondary | ICD-10-CM

## 2021-11-30 DIAGNOSIS — M6281 Muscle weakness (generalized): Secondary | ICD-10-CM

## 2021-11-30 NOTE — Therapy (Signed)
[place] ?[facility] ?[address] [place], [place], [address] ?Phone: [phone]   Fax:  [phone] ? ?Physical Therapy Treatment ? ?Patient Details  ?Name: Kelsey Bell ?MRN: [id number] ?Date of Birth: 20-May-1981 ?Referring Provider (PT): Dr. Anderson Malta ? ? ?Encounter Date: 11/30/2021 ? ? PT End of Session - 12/02/21 1836   ? ? Visit Number 24   ? Number of Visits 39   ? Date for PT Re-Evaluation 01/12/22   ? Authorization - Visit Number 4   ? Authorization - Number of Visits 10   ? PT Start Time [phone]   ? PT Stop Time [phone]   ? PT Time Calculation (min) 61 min   ? Equipment Utilized During Treatment --   ? Activity Tolerance Patient limited by pain;Patient tolerated treatment well   ? Behavior During Therapy [facility] for tasks assessed/performed   ? ?  ?  ? ?  ? ? ?Past Medical History:  ?Diagnosis Date  ? Allergy   ? Anxiety   ? Arthritis   ? Asthma   ? Breast pain present for several months  ? Bil LT >RT across of breasts  ? CRPS (complex regional pain syndrome type I)   ? Depression   ? DVT (deep venous thrombosis) ([place]) 06/2018  ? Fibromyalgia   ? Kidney stones   ? LGSIL on Pap smear of cervix 2007  ? Migraine   ? Neuromuscular disorder ([place])   ? Complex regional pain syndrome and Fibromyalgia  ? Pancreatic pseudocyst   ? Pancreatitis   ? ? ?Past Surgical History:  ?Procedure Laterality Date  ? COLPOSCOPY  2017  ? neg bx  ? ERCP    ? EXTRACORPOREAL SHOCK WAVE LITHOTRIPSY Right 04/26/2017  ? Procedure: EXTRACORPOREAL SHOCK WAVE LITHOTRIPSY (ESWL);  Surgeon: Hollice Espy, MD;  Location: [facility] ORS;  Service: Urology;  Laterality: Right;  ? GASTROSTOMY-JEJEUNOSTOMY TUBE CHANGE/PLACEMENT    ? KNEE ARTHROSCOPY Right   ? nj placement    ? TONSILLECTOMY    ? UPPER ESOPHAGEAL ENDOSCOPIC ULTRASOUND (EUS)    ? UPPER GASTROINTESTINAL ENDOSCOPY    ? ? ?There were no vitals filed for this visit. ? ? ?Pt. has had a difficult time this past week secondary to GI tube falling out and  having to return for outpatient surgery last Tuesday. Pt. reports her pancreas has been really spicy and angry over the past week. Pt. demonstrates an increase in L shoulder AROM prior to tx. Session. ? ? ? ? ? ? ?There.ex.: ?  ?Nustep L2 10 min. B UE/LE (consistent cadence) ? ?Standing hip flexion/ abduction/ extension/ partial squats 20x each.  Modified heel raises 20x (mirror feedback)- donning L ankle brace.  ? ?Nautilus: seated lat. Pull downs 40#/ standing tricep extension 30#/ scap. Retraction 30# 20x each.   ? ?Walking in PT clinic with consistent heel strike/ toe off while donning ankle brace on L.   ? ?Standing B shoulder AROM (all planes)- marked increase in L shoulder flexion noted (>145 deg.) ? ?  ?Manual tx.: ? ?MH to low back in sitting prior to supine ex.  ? ?Supine L shoulder AA/PROM (all planes)- 5x each.  ?  ?Prone CPA/UPA mobs. to thoracic spine as tolerated (central).  STM to L scapular/ posterior deltoid/ thoracic paraspinals.   ?Use of Hypervolt to thoracic/cervical paraspinal muscles in prone.  Good tx. Tolerance.   ?  ? ? PT Long Term Goals - 11/18/21 0809   ? ?  ?  PT LONG TERM GOAL #1  ? Title Pt will increase FOTO score to 56 to improve overall perceived functional ability.   ? Baseline IE: 27.  1/3: 27 (no change)- pain limited.  3/2: 54 (ankle/foot)- 20th visit.   ? Time 8   ? Period Weeks   ? Status Partially Met   ? Target Date 01/12/22   ?  ? PT LONG TERM GOAL #2  ? Title Pt will increase ankle strength by at least 1/2 MMT grade in order to demonstrate improvement in strength and functio   ? Baseline L ankle strength grossly 3+/5 MMT (pain limited).  R ankle strength 5/5 MMT.  L ankle remain pain limited.   ? Time 8   ? Period Weeks   ? Status Partially Met   ? Target Date 01/12/22   ?  ? PT LONG TERM GOAL #3  ? Title Pt will increase L ankle AROM to [facility] as compared to R ankle to improve functional mobility.   ? Baseline L/R ankle AROM: DF (-5/11 deg.), PF (44/58 deg.), IV (12/30  deg.), EV (15/15 deg.).   ? Time 8   ? Period Weeks   ? Status Partially Met   ? Target Date 01/12/22   ?  ? PT LONG TERM GOAL #4  ? Title Pt. will ambulate with normalized gait pattern and consistent heel strike with no increase c/o L ankle pain without use of ankle support/brace.   ? Baseline L antalgic gait pattern.  7/10 L ankle pain.   ? Time 8   ? Period Weeks   ? Status Partially Met   ? Target Date 01/12/22   ?  ? PT LONG TERM GOAL #5  ? Title Pt. will increase L shoulder AROM to [facility] as compared to R shoulder to improve pain-free mobility/ ADL.   ? Baseline Supine L shoulder AROM: flexion 68 deg. (pain/guarded), abduction 66 deg. (pain), ER 32 deg., IR 85 deg.  Pt. unable to tolerate PROM to L shoulder at this time (>8/10 pain reported).  No MMT.   ? Time 8   ? Period Weeks   ? Status Not Met   ? Target Date 01/12/22   ?  ? PT LONG TERM GOAL #6  ? Title Pt. will increase FOTO to 58 to improve shoulder functional pain-free mobility.   ? Baseline Initial FOTO for shoulder: 43.  2/7: 54 (for shoulder on 5th visit).  3/2: 42 (10th visit in [place]).   ? Time 8   ? Period Weeks   ? Status Partially Met   ? Target Date 01/12/22   ? ?  ?  ? ?  ? ? ? ? Plan - 12/02/21 1837   ? ? Clinical Impression Statement Pt. feels like "crap" during tx. session but motivated to work towards L shoulder goals to improve functional mobility.  Pt. completes seated/standing Nautilus ex. in a pain tolerable range with good upright posture/ L shoulder mobility.  Pt. ambulates with normalized gait pattern with use of ankle stability brace.  Pt. reports persistent L abdominal pain t/o tx. and states pain is from an ongoing pancreas flare-up.  Pt. instructed to focus on L shoulder AROM and daily walking with focus on consistent heel strike/ toe off.   ? Examination-Activity Limitations Bathing;Bed Mobility;Bend;Carry;Dressing;Hygiene/Grooming;Lift;Locomotion Level;Sit;Squat;Stairs;Toileting;Stand   ? Examination-Participation Restrictions  Cleaning;Laundry;Meal Prep;Community Activity   ? Stability/Clinical Decision Making Evolving/Moderate complexity   ? Clinical Decision Making Moderate   ? Rehab Potential Fair   ?  PT Frequency 2x / week   ? PT Duration 8 weeks   ? PT Treatment/Interventions ADLs/Self Care Home Management;Aquatic Therapy;Cryotherapy;Iontophoresis 46m/ml Dexamethasone;Patient/family education;Neuromuscular re-education;Balance training;Therapeutic exercise;Therapeutic activities;Functional mobility training;Stair training;Gait training;Manual techniques;Passive range of motion;Dry needling;Electrical Stimulation   ? PT Next Visit Plan Continue with L shoulder manual tx. L ankle stability exercises.  Incorporate more ther.ex.   ? PT Home Exercise Plan [email or web address]   ? Consulted and Agree with Plan of Care Patient   ? ?  ?  ? ?  ? ? ?Patient will benefit from skilled therapeutic intervention in order to improve the following deficits and impairments:  Abnormal gait, Decreased strength, Increased muscle spasms, Postural dysfunction, Impaired perceived functional ability, Decreased activity tolerance, Decreased mobility, Impaired flexibility, Obesity, Decreased range of motion, Decreased balance, Pain, Decreased endurance, Difficulty walking, Increased edema ? ?Visit Diagnosis: ?Pain in left ankle and joints of left foot ? ?Shoulder joint stiffness, left ? ?Acute bilateral low back pain with bilateral sciatica ? ?Ankle joint stiffness, left ? ?Muscle weakness (generalized) ? ?Joint stiffness ? ? ? ? ?Problem List ?Patient Active Problem List  ? Diagnosis Date Noted  ? Orthostatic syncope   ? Dehydration 06/09/2020  ? Genetic testing 10/24/2018  ? Idiopathic acute pancreatitis 07/22/2018  ? [place] 06/[place]/2018  ? Acute pancreatitis 05/27/2018  ? Right knee pain 05/19/2018  ? Peripheral edema 03/05/2018  ? Neuropathic pain 11/02/2017  ? Kidney stone 04/25/2017  ? Ureteral stone   ? Migraines 12/15/2016  ? Morbid  obesity with BMI of 45.0-49.9, adult ([place] 10/20/2015  ? Asthma, moderate 05/21/2015  ? Polyarthralgia 12/01/2011  ? Fibromyalgia 09/18/2011  ? Low back pain 09/18/2011  ? ?MPura Spice PT, DPT # [id number]?3/31

## 2021-12-02 ENCOUNTER — Ambulatory Visit: Payer: Medicare Other | Admitting: Physical Therapy

## 2021-12-02 DIAGNOSIS — M5441 Lumbago with sciatica, right side: Secondary | ICD-10-CM

## 2021-12-02 DIAGNOSIS — M25672 Stiffness of left ankle, not elsewhere classified: Secondary | ICD-10-CM

## 2021-12-02 DIAGNOSIS — M2560 Stiffness of unspecified joint, not elsewhere classified: Secondary | ICD-10-CM

## 2021-12-02 DIAGNOSIS — M25612 Stiffness of left shoulder, not elsewhere classified: Secondary | ICD-10-CM

## 2021-12-02 DIAGNOSIS — M25572 Pain in left ankle and joints of left foot: Secondary | ICD-10-CM

## 2021-12-02 DIAGNOSIS — M6281 Muscle weakness (generalized): Secondary | ICD-10-CM

## 2021-12-03 NOTE — Therapy (Signed)
?OUTPATIENT PHYSICAL THERAPY TREATMENT NOTE ? ? ?Patient Name: Kelsey Bell ?MRN: [id number] ?DOB:[birth year], 41 y.o., female ?Today's Date: 12/03/2021 ? ?PCP: Ricardo Jericho, NP ?REFERRING PROVIDER: Anderson Malta, MD ? ? PT End of Session - 12/03/21 1101   ? ? Visit Number 25   ? Number of Visits 39   ? Date for PT Re-Evaluation 01/12/22   ? Authorization Type 2 of 10 CAID authorization (3/27 to 5/12)   ? Authorization - Visit Number 5   ? Authorization - Number of Visits 10   ? PT Start Time 1030   ? PT Stop Time 1128   ? PT Time Calculation (min) 58 min   ? Activity Tolerance Patient limited by pain;Patient tolerated treatment well   ? Behavior During Therapy [facility] for tasks assessed/performed   ? ?  ?  ? ?  ? ? ?Past Medical History:  ?Diagnosis Date  ? Allergy   ? Anxiety   ? Arthritis   ? Asthma   ? Breast pain present for several months  ? Bil LT >RT across of breasts  ? CRPS (complex regional pain syndrome type I)   ? Depression   ? DVT (deep venous thrombosis) ([place]) 06/2018  ? Fibromyalgia   ? Kidney stones   ? LGSIL on Pap smear of cervix 2007  ? Migraine   ? Neuromuscular disorder ([place])   ? Complex regional pain syndrome and Fibromyalgia  ? Pancreatic pseudocyst   ? Pancreatitis   ? ?Past Surgical History:  ?Procedure Laterality Date  ? COLPOSCOPY  2017  ? neg bx  ? ERCP    ? EXTRACORPOREAL SHOCK WAVE LITHOTRIPSY Right 04/26/2017  ? Procedure: EXTRACORPOREAL SHOCK WAVE LITHOTRIPSY (ESWL);  Surgeon: Hollice Espy, MD;  Location: [facility] ORS;  Service: Urology;  Laterality: Right;  ? GASTROSTOMY-JEJEUNOSTOMY TUBE CHANGE/PLACEMENT    ? KNEE ARTHROSCOPY Right   ? nj placement    ? TONSILLECTOMY    ? UPPER ESOPHAGEAL ENDOSCOPIC ULTRASOUND (EUS)    ? UPPER GASTROINTESTINAL ENDOSCOPY    ? ?Patient Active Problem List  ? Diagnosis Date Noted  ? Orthostatic syncope   ? Dehydration 06/09/2020  ? Genetic testing 10/24/2018  ? Idiopathic acute pancreatitis 07/22/2018  ? DVT (deep venous thrombosis) ([place])  06/04/2018  ? Acute pancreatitis 05/27/2018  ? Right knee pain 05/19/2018  ? Peripheral edema 03/05/2018  ? Neuropathic pain 11/02/2017  ? Kidney stone 04/25/2017  ? Ureteral stone   ? Migraines 12/15/2016  ? Morbid obesity with BMI of 45.0-49.9, adult ([place]) 10/20/2015  ? Asthma, moderate 05/21/2015  ? Polyarthralgia 12/01/2011  ? Fibromyalgia 09/18/2011  ? Low back pain 09/18/2011  ? ? ?REFERRING DIAG: Sprain of L ankle/ L ankle pain/ L shoulder pain/ Low back pain ? ?THERAPY DIAG:  ?Pain in left ankle and joints of left foot ? ?Shoulder joint stiffness, left ? ?Acute bilateral low back pain with bilateral sciatica ? ?Ankle joint stiffness, left ? ?Muscle weakness (generalized) ? ?Joint stiffness ? ?PERTINENT HISTORY: see evaluation ? ?PRECAUTIONS: N/A ? ?SUBJECTIVE: Pt. Went to ER at [facility] secondary to abdominal pain and gastroparesis.  Pt. Received a CT scan/ IV fluids and medications.  Pt. Was not admitted to hospital.  Pt. Has not used the bathroom and states she is constipated due to Zofran.   ? ?PAIN:  ?Are you having pain? Yes: NPRS scale: 5/10 ?Pain location: L abdominal region ?Pain description: excrutiating ?Aggravating factors: movement ?Relieving factors: MH/ rest/ medications ? ? ? ? ?TODAY'S  TREATMENT:  ?There.ex.: ?  ?Nustep L3 10 min. B UE/LE (consistent cadence) ?  ?Nautilus: seated lat. Pull downs 40#/ standing tricep extension 30#/ scap. Retraction 30# 20x each.   ?  ?Walking in PT clinic with consistent heel strike/ toe off without ankle brace on L. ? ?Tandem gait (forward/ backwards in //-bars).  Improving ankle stability.   ?   ?Manual tx.: ?    ?Prone CPA/UPA mobs. to thoracic spine as tolerated (central).  STM to L scapular/ posterior deltoid/ thoracic paraspinals.   ? ?Use of Hypervolt to thoracic/cervical paraspinal muscles in prone.  Good tx. Tolerance.   ? ?Supine LE/lumbar generalized stretches.   ? ? ?PATIENT EDUCATION: ?Education details: HEP ?Person educated: Patient ?Education  method: Explanation and Demonstration ?Education comprehension: verbalized understanding and returned demonstration ? ? ?HOME EXERCISE PROGRAM: ?[id number] ? ? ? ? PT Long Term Goals -  ? ?  ? PT LONG TERM GOAL #1  ? Title Pt will increase FOTO score to 56 to improve overall perceived functional ability.   ? Baseline IE: 27.  1/3: 27 (no change)- pain limited.  3/2: 72 (ankle/foot)- 20th visit.   ? Time 8   ? Period Weeks   ? Status Partially Met   ? Target Date 01/12/22   ?  ? PT LONG TERM GOAL #2  ? Title Pt will increase ankle strength by at least 1/2 MMT grade in order to demonstrate improvement in strength and functio   ? Baseline L ankle strength grossly 3+/5 MMT (pain limited).  R ankle strength 5/5 MMT.  L ankle remain pain limited.   ? Time 8   ? Period Weeks   ? Status Partially Met   ? Target Date 01/12/22   ?  ? PT LONG TERM GOAL #3  ? Title Pt will increase L ankle AROM to [facility] as compared to R ankle to improve functional mobility.   ? Baseline L/R ankle AROM: DF (-5/11 deg.), PF (44/58 deg.), IV (12/30 deg.), EV (15/15 deg.).   ? Time 8   ? Period Weeks   ? Status Partially Met   ? Target Date 01/12/22   ?  ? PT LONG TERM GOAL #4  ? Title Pt. will ambulate with normalized gait pattern and consistent heel strike with no increase c/o L ankle pain without use of ankle support/brace.   ? Baseline L antalgic gait pattern.  7/10 L ankle pain.   ? Time 8   ? Period Weeks   ? Status Partially Met   ? Target Date 01/12/22   ?  ? PT LONG TERM GOAL #5  ? Title Pt. will increase L shoulder AROM to [facility] as compared to R shoulder to improve pain-free mobility/ ADL.   ? Baseline Supine L shoulder AROM: flexion 68 deg. (pain/guarded), abduction 66 deg. (pain), ER 32 deg., IR 85 deg.  Pt. unable to tolerate PROM to L shoulder at this time (>8/10 pain reported).  No MMT.   ? Time 8   ? Period Weeks   ? Status Not Met   ? Target Date 01/12/22   ?  ? PT LONG TERM GOAL #6  ? Title Pt. will increase FOTO to 58 to improve  shoulder functional pain-free mobility.   ? Baseline Initial FOTO for shoulder: 43.  2/7: 54 (for shoulder on 5th visit).  3/2: 87 (10th visit in [place]).   ? Time 8   ? Period Weeks   ? Status Partially Met   ?  Target Date 01/12/22   ? ?  ?  ? ?  ? ? ? Plan - 12/03/21 1112   ? ? Clinical Impression Statement Pt. Reports feeling better today as compared to yesterday but still dealing with symptoms of gastroparesis. Pt. completes seated/standing Nautilus ex. in a pain tolerable range with good upright posture/ L shoulder mobility. Supine L shoulder flexion 158 deg, (pain limited but marked increase in AROM noted).  Good L scapular mobility noted in standing posture.  Pt. ambulates with normalized gait pattern without use of ankle stability brace. Pt. reports persistent L abdominal pain t/o tx.  Good tandem gait with increase focus required.  Pt. instructed to focus on L shoulder AROM and daily walking with focus on consistent heel strike/ toe off.   ? Personal Factors and Comorbidities Comorbidity 3+   ? Examination-Activity Limitations Bathing;Bed Mobility;Bend;Carry;Dressing;Hygiene/Grooming;Lift;Locomotion Level;Sit;Squat;Stairs;Toileting;Stand   ? Examination-Participation Restrictions Cleaning;Laundry;Meal Prep;Community Activity   ? Stability/Clinical Decision Making Evolving/Moderate complexity   ? Clinical Decision Making Moderate   ? Rehab Potential Fair   ? PT Frequency 2x / week   ? PT Duration 8 weeks   ? PT Treatment/Interventions ADLs/Self Care Home Management;Aquatic Therapy;Cryotherapy;Iontophoresis 72m/ml Dexamethasone;Patient/family education;Neuromuscular re-education;Balance training;Therapeutic exercise;Therapeutic activities;Functional mobility training;Stair training;Gait training;Manual techniques;Passive range of motion;Dry needling;Electrical Stimulation   ? PT Next Visit Plan Continue with L shoulder manual tx. L ankle stability exercises.  Incorporate more ther.ex.   ? PT Home Exercise Plan  [email or web address]   ? Consulted and Agree with Plan of Care Patient   ? ?  ?  ? ?  ? ? ? ?MPura Spice PT, DPT # [phone]?12/03/2021, 11:22 AM ? ?   ?

## 2021-12-07 ENCOUNTER — Encounter: Payer: Self-pay | Admitting: Physical Therapy

## 2021-12-07 ENCOUNTER — Ambulatory Visit: Payer: Medicare Other | Attending: Orthopedic Surgery | Admitting: Physical Therapy

## 2021-12-07 DIAGNOSIS — M25572 Pain in left ankle and joints of left foot: Secondary | ICD-10-CM | POA: Diagnosis present

## 2021-12-07 DIAGNOSIS — M2560 Stiffness of unspecified joint, not elsewhere classified: Secondary | ICD-10-CM | POA: Diagnosis present

## 2021-12-07 DIAGNOSIS — M5442 Lumbago with sciatica, left side: Secondary | ICD-10-CM | POA: Insufficient documentation

## 2021-12-07 DIAGNOSIS — M6281 Muscle weakness (generalized): Secondary | ICD-10-CM

## 2021-12-07 DIAGNOSIS — M25612 Stiffness of left shoulder, not elsewhere classified: Secondary | ICD-10-CM | POA: Diagnosis present

## 2021-12-07 DIAGNOSIS — M5441 Lumbago with sciatica, right side: Secondary | ICD-10-CM

## 2021-12-07 DIAGNOSIS — M25672 Stiffness of left ankle, not elsewhere classified: Secondary | ICD-10-CM | POA: Diagnosis present

## 2021-12-07 NOTE — Therapy (Signed)
?OUTPATIENT PHYSICAL THERAPY TREATMENT NOTE ? ? ?Patient Name: Kelsey Bell ?MRN: [id number] ?DOB:01/20/1981, 41 y.o., female ?Today's Date: 12/07/2021 ? ?PCP: Ricardo Jericho, NP ?REFERRING PROVIDER: Anderson Malta, MD ? ? PT End of Session - 12/07/21 1220   ? ? Visit Number 26   ? Number of Visits 39   ? Date for PT Re-Evaluation 01/12/22   ? Authorization Type 3 of 10 CAID authorization (3/27 to 5/12)   ? Authorization - Visit Number 6   ? Authorization - Number of Visits 10   ? PT Start Time [phone]   ? PT Stop Time 0911   ? PT Time Calculation (min) 45 min   ? Activity Tolerance Patient limited by pain;Patient tolerated treatment well   ? Behavior During Therapy [facility] for tasks assessed/performed   ? ?  ?  ? ?  ? ? ?Past Medical History:  ?Diagnosis Date  ? Allergy   ? Anxiety   ? Arthritis   ? Asthma   ? Breast pain present for several months  ? Bil LT >RT across of breasts  ? CRPS (complex regional pain syndrome type I)   ? Depression   ? DVT (deep venous thrombosis) ([place]) 06/2018  ? Fibromyalgia   ? Kidney stones   ? LGSIL on Pap smear of cervix 2007  ? Migraine   ? Neuromuscular disorder ([place])   ? Complex regional pain syndrome and Fibromyalgia  ? Pancreatic pseudocyst   ? Pancreatitis   ? ?Past Surgical History:  ?Procedure Laterality Date  ? COLPOSCOPY  2017  ? neg bx  ? ERCP    ? EXTRACORPOREAL SHOCK WAVE LITHOTRIPSY Right 04/26/2017  ? Procedure: EXTRACORPOREAL SHOCK WAVE LITHOTRIPSY (ESWL);  Surgeon: Hollice Espy, MD;  Location: [facility] ORS;  Service: Urology;  Laterality: Right;  ? GASTROSTOMY-JEJEUNOSTOMY TUBE CHANGE/PLACEMENT    ? KNEE ARTHROSCOPY Right   ? nj placement    ? TONSILLECTOMY    ? UPPER ESOPHAGEAL ENDOSCOPIC ULTRASOUND (EUS)    ? UPPER GASTROINTESTINAL ENDOSCOPY    ? ?Patient Active Problem List  ? Diagnosis Date Noted  ? Orthostatic syncope   ? Dehydration 06/09/2020  ? Genetic testing 10/24/2018  ? Idiopathic acute pancreatitis 07/22/2018  ? DVT (deep venous thrombosis) ([place])  06/04/2018  ? Acute pancreatitis 05/27/2018  ? Right knee pain 05/19/2018  ? Peripheral edema 03/05/2018  ? Neuropathic pain 11/02/2017  ? Kidney stone 04/25/2017  ? Ureteral stone   ? Migraines 12/15/2016  ? Morbid obesity with BMI of 45.0-49.9, adult ([place]) 10/20/2015  ? Asthma, moderate 05/21/2015  ? Polyarthralgia 12/01/2011  ? Fibromyalgia 09/18/2011  ? Low back pain 09/18/2011  ? ? ?REFERRING DIAG: Sprain of L ankle/ L ankle pain/ L shoulder pain/ Low back pain ? ?THERAPY DIAG:  ?Pain in left ankle and joints of left foot ? ?Shoulder joint stiffness, left ? ?Acute bilateral low back pain with bilateral sciatica ? ?Ankle joint stiffness, left ? ?Muscle weakness (generalized) ? ?Joint stiffness ? ?PERTINENT HISTORY: see evaluation ? ?PRECAUTIONS: N/A ? ?SUBJECTIVE: Pt. Reports back pain is "excruciating".  Pt. Was active all day yesterday.   ? ?PAIN:  ?Are you having pain? Yes: NPRS scale: 8/10 ?Pain location: Low Back (generalized/ bilateral) ?Pain description: excruciating ?Aggravating factors: movement ?Relieving factors: MH/ rest/ medications ? ? ? ? ?TODAY'S TREATMENT:  ?There.ex.: ?  ?Nustep L3 10 min. B UE/LE (consistent cadence) ?  ?Standing weighted wand ex.: shoulder flexion/ chest press 20x.    ? ?Reassessment of  gait and L shoulder AROM.   ?  ?   ?Manual tx.: ?    ?Prone CPA/UPA mobs. to thoracic spine as tolerated (central).  STM to L scapular/ posterior deltoid/ thoracic paraspinals.   ? ?Use of Hypervolt to thoracic/cervical paraspinal muscles in prone.  Less tenderness over L posterior deltoid/ scapular region and increase tenderness in B lumbar/ SI region.   ? ? ?PATIENT EDUCATION: ?Education details: HEP ?Person educated: Patient ?Education method: Explanation and Demonstration ?Education comprehension: verbalized understanding and returned demonstration ? ? ?HOME EXERCISE PROGRAM: ?[id number] ? ? ? ? PT Long Term Goals -  ? ?  ? PT LONG TERM GOAL #1  ? Title Pt will increase FOTO score to 56 to  improve overall perceived functional ability.   ? Baseline IE: 27.  1/3: 27 (no change)- pain limited.  3/2: 4 (ankle/foot)- 20th visit.   ? Time 8   ? Period Weeks   ? Status Partially Met   ? Target Date 01/12/22   ?  ? PT LONG TERM GOAL #2  ? Title Pt will increase ankle strength by at least 1/2 MMT grade in order to demonstrate improvement in strength and functio   ? Baseline L ankle strength grossly 3+/5 MMT (pain limited).  R ankle strength 5/5 MMT.  L ankle remain pain limited.   ? Time 8   ? Period Weeks   ? Status Partially Met   ? Target Date 01/12/22   ?  ? PT LONG TERM GOAL #3  ? Title Pt will increase L ankle AROM to [facility] as compared to R ankle to improve functional mobility.   ? Baseline L/R ankle AROM: DF (-5/11 deg.), PF (44/58 deg.), IV (12/30 deg.), EV (15/15 deg.).   ? Time 8   ? Period Weeks   ? Status Partially Met   ? Target Date 01/12/22   ?  ? PT LONG TERM GOAL #4  ? Title Pt. will ambulate with normalized gait pattern and consistent heel strike with no increase c/o L ankle pain without use of ankle support/brace.   ? Baseline L antalgic gait pattern.  7/10 L ankle pain.   ? Time 8   ? Period Weeks   ? Status Partially Met   ? Target Date 01/12/22   ?  ? PT LONG TERM GOAL #5  ? Title Pt. will increase L shoulder AROM to [facility] as compared to R shoulder to improve pain-free mobility/ ADL.   ? Baseline Supine L shoulder AROM: flexion 68 deg. (pain/guarded), abduction 66 deg. (pain), ER 32 deg., IR 85 deg.  Pt. unable to tolerate PROM to L shoulder at this time (>8/10 pain reported).  No MMT.   ? Time 8   ? Period Weeks   ? Status Not Met   ? Target Date 01/12/22   ?  ? PT LONG TERM GOAL #6  ? Title Pt. will increase FOTO to 58 to improve shoulder functional pain-free mobility.   ? Baseline Initial FOTO for shoulder: 43.  2/7: 54 (for shoulder on 5th visit).  3/2: 44 (10th visit in [place]).   ? Time 8   ? Period Weeks   ? Status Partially Met   ? Target Date 01/12/22   ? ?  ?  ? ?  ? ? ? Plan -  12/03/21 1112   ? ? Clinical Impression Statement Pain limited during prone STM to lumbar spine/ superior gluts.  L shoulder AROM improving but marked increase  in pain with shoulder flexion >100 deg.  Pt. instructed to focus on L shoulder AROM and daily walking with focus on consistent heel strike/ toe off.   ? Personal Factors and Comorbidities Comorbidity 3+   ? Examination-Activity Limitations Bathing;Bed Mobility;Bend;Carry;Dressing;Hygiene/Grooming;Lift;Locomotion Level;Sit;Squat;Stairs;Toileting;Stand   ? Examination-Participation Restrictions Cleaning;Laundry;Meal Prep;Community Activity   ? Stability/Clinical Decision Making Evolving/Moderate complexity   ? Clinical Decision Making Moderate   ? Rehab Potential Fair   ? PT Frequency 2x / week   ? PT Duration 8 weeks   ? PT Treatment/Interventions ADLs/Self Care Home Management;Aquatic Therapy;Cryotherapy;Iontophoresis 4m/ml Dexamethasone;Patient/family education;Neuromuscular re-education;Balance training;Therapeutic exercise;Therapeutic activities;Functional mobility training;Stair training;Gait training;Manual techniques;Passive range of motion;Dry needling;Electrical Stimulation   ? PT Next Visit Plan Continue with L shoulder manual tx. L ankle stability exercises.  Incorporate more ther.ex.   ? PT Home Exercise Plan [email or web address]   ? Consulted and Agree with Plan of Care Patient   ? ?  ?  ? ?  ? ? ? ?MPura Spice PT, DPT # [phone]?12/07/2021, 12:22 PM ? ?  u ?

## 2021-12-09 ENCOUNTER — Ambulatory Visit: Payer: Medicare Other | Admitting: Physical Therapy

## 2021-12-12 ENCOUNTER — Ambulatory Visit: Payer: Medicare Other | Admitting: Physical Therapy

## 2021-12-14 ENCOUNTER — Encounter: Payer: Self-pay | Admitting: Physical Therapy

## 2021-12-14 ENCOUNTER — Ambulatory Visit: Payer: Medicare Other | Admitting: Physical Therapy

## 2021-12-14 DIAGNOSIS — M2560 Stiffness of unspecified joint, not elsewhere classified: Secondary | ICD-10-CM

## 2021-12-14 DIAGNOSIS — M5441 Lumbago with sciatica, right side: Secondary | ICD-10-CM

## 2021-12-14 DIAGNOSIS — M25572 Pain in left ankle and joints of left foot: Secondary | ICD-10-CM | POA: Diagnosis not present

## 2021-12-14 DIAGNOSIS — M25672 Stiffness of left ankle, not elsewhere classified: Secondary | ICD-10-CM

## 2021-12-14 DIAGNOSIS — M25612 Stiffness of left shoulder, not elsewhere classified: Secondary | ICD-10-CM

## 2021-12-14 DIAGNOSIS — M6281 Muscle weakness (generalized): Secondary | ICD-10-CM

## 2021-12-14 NOTE — Therapy (Signed)
?OUTPATIENT PHYSICAL THERAPY TREATMENT NOTE ? ? ?Patient Name: Kelsey Bell ?MRN: [id number] ?DOB:[birth year], 41 y.o., female ?Today's Date: 12/24/2021 ? ?PCP: Ricardo Jericho, NP ?REFERRING PROVIDER: Anderson Malta, MD ? ? ? ? PT End of Session - 12/14/21 1301   ? ? Visit Number 27   ? Number of Visits 39   ? Date for PT Re-Evaluation 01/12/22   ? Authorization Type 4 of 10 CAID authorization (3/27 to 5/12)   ? Authorization - Visit Number 7   ? Authorization - Number of Visits 10   ? PT Start Time 1301 to 1403  (62 minutes)  ? Activity Tolerance Patient limited by pain;Patient tolerated treatment well   ? Behavior During Therapy [facility] for tasks assessed/performed   ? ?  ?  ? ?  ? ? ? ? ?Past Medical History:  ?Diagnosis Date  ? Allergy   ? Anxiety   ? Arthritis   ? Asthma   ? Breast pain present for several months  ? Bil LT >RT across of breasts  ? CRPS (complex regional pain syndrome type I)   ? Depression   ? DVT (deep venous thrombosis) ([place]) 06/2018  ? Fibromyalgia   ? Kidney stones   ? LGSIL on Pap smear of cervix 2007  ? Migraine   ? Neuromuscular disorder ([place])   ? Complex regional pain syndrome and Fibromyalgia  ? Pancreatic pseudocyst   ? Pancreatitis   ? ?Past Surgical History:  ?Procedure Laterality Date  ? COLPOSCOPY  2017  ? neg bx  ? ERCP    ? EXTRACORPOREAL SHOCK WAVE LITHOTRIPSY Right 04/26/2017  ? Procedure: EXTRACORPOREAL SHOCK WAVE LITHOTRIPSY (ESWL);  Surgeon: Hollice Espy, MD;  Location: [facility] ORS;  Service: Urology;  Laterality: Right;  ? GASTROSTOMY-JEJEUNOSTOMY TUBE CHANGE/PLACEMENT    ? KNEE ARTHROSCOPY Right   ? nj placement    ? TONSILLECTOMY    ? UPPER ESOPHAGEAL ENDOSCOPIC ULTRASOUND (EUS)    ? UPPER GASTROINTESTINAL ENDOSCOPY    ? ?Patient Active Problem List  ? Diagnosis Date Noted  ? Orthostatic syncope   ? Dehydration 06/09/2020  ? Genetic testing 10/24/2018  ? Idiopathic acute pancreatitis 07/22/2018  ? DVT (deep venous thrombosis) ([place]) 06/04/2018  ? Acute  pancreatitis 05/27/2018  ? Right knee pain 05/19/2018  ? Peripheral edema 03/05/2018  ? Neuropathic pain 11/02/2017  ? Kidney stone 04/25/2017  ? Ureteral stone   ? Migraines 12/15/2016  ? Morbid obesity with BMI of 45.0-49.9, adult ([place]) 10/20/2015  ? Asthma, moderate 05/21/2015  ? Polyarthralgia 12/01/2011  ? Fibromyalgia 09/18/2011  ? Low back pain 09/18/2011  ? ? ?REFERRING DIAG: Sprain of L ankle/ L ankle pain/ L shoulder pain/ Low back pain ? ?THERAPY DIAG:  ?Pain in left ankle and joints of left foot ? ?Shoulder joint stiffness, left ? ?Acute bilateral low back pain with bilateral sciatica ? ?Ankle joint stiffness, left ? ?Muscle weakness (generalized) ? ?Joint stiffness ? ?PERTINENT HISTORY: see evaluation ? ?PRECAUTIONS: N/A ? ?SUBJECTIVE:  Pt. Reports generalized pain symptoms but better than last tx.  Pt. Reports limited carrying tasks this morning.  Pt. Reports her abdomen/ pancreas is spicy.  Pt. Reports skin irritation around Gtube is still irritated.     ? ?PAIN:  ?Are you having pain? Yes: NPRS scale: 5/10 ?Pain location: Low Back (generalized/ bilateral) ?Pain description: excruciating ?Aggravating factors: movement ?Relieving factors: MH/ rest/ medications ? ? ? ? ?TODAY'S TREATMENT:  ?There.ex.: ?  ?Nustep L3 10 min. B UE/LE (consistent  cadence)- discussed daily activities/ upcoming MRI for abdominal pain.   ?  ?Standing B UE Nautilus: 40# lat. Pull downs/ 30# tricep extension/ 30# scapular retraction/ 30# chest press (L foot in front) 20x.     ? ?Standing hip ex. (Flexion/ abduction/ extension)- 20x each.  Partial squats (pain limited).   ?  ?   ?Manual tx.: ?    ?Prone CPA/UPA mobs. to thoracic spine as tolerated (central).  STM to L scapular/ posterior deltoid/ thoracic paraspinals.   ? ?Use of Hypervolt to thoracic/cervical paraspinal muscles in prone.  Less tenderness over L posterior deltoid/ scapular region and increase tenderness in B lumbar/ SI region.   ? ? ?PATIENT EDUCATION: ?Education  details: HEP ?Person educated: Patient ?Education method: Explanation and Demonstration ?Education comprehension: verbalized understanding and returned demonstration ? ? ?HOME EXERCISE PROGRAM: ?[email or web address] ? ? ? ? PT Long Term Goals -  ? ?  ? PT LONG TERM GOAL #1  ? Title Pt will increase FOTO score to 56 to improve overall perceived functional ability.   ? Baseline IE: 27.  1/3: 27 (no change)- pain limited.  3/2: 84 (ankle/foot)- 20th visit.   ? Time 8   ? Period Weeks   ? Status Partially Met   ? Target Date 01/12/22   ?  ? PT LONG TERM GOAL #2  ? Title Pt will increase ankle strength by at least 1/2 MMT grade in order to demonstrate improvement in strength and functio   ? Baseline L ankle strength grossly 3+/5 MMT (pain limited).  R ankle strength 5/5 MMT.  L ankle remain pain limited.   ? Time 8   ? Period Weeks   ? Status Partially Met   ? Target Date 01/12/22   ?  ? PT LONG TERM GOAL #3  ? Title Pt will increase L ankle AROM to [facility] as compared to R ankle to improve functional mobility.   ? Baseline L/R ankle AROM: DF (-5/11 deg.), PF (44/58 deg.), IV (12/30 deg.), EV (15/15 deg.).   ? Time 8   ? Period Weeks   ? Status Partially Met   ? Target Date 01/12/22   ?  ? PT LONG TERM GOAL #4  ? Title Pt. will ambulate with normalized gait pattern and consistent heel strike with no increase c/o L ankle pain without use of ankle support/brace.   ? Baseline L antalgic gait pattern.  7/10 L ankle pain.   ? Time 8   ? Period Weeks   ? Status Partially Met   ? Target Date 01/12/22   ?  ? PT LONG TERM GOAL #5  ? Title Pt. will increase L shoulder AROM to [facility] as compared to R shoulder to improve pain-free mobility/ ADL.   ? Baseline Supine L shoulder AROM: flexion 68 deg. (pain/guarded), abduction 66 deg. (pain), ER 32 deg., IR 85 deg.  Pt. unable to tolerate PROM to L shoulder at this time (>8/10 pain reported).  No MMT.   ? Time 8   ? Period Weeks   ? Status Not Met   ? Target Date 01/12/22   ?  ? PT LONG TERM GOAL #6  ?  Title Pt. will increase FOTO to 58 to improve shoulder functional pain-free mobility.   ? Baseline Initial FOTO for shoulder: 43.  2/7: 54 (for shoulder on 5th visit).  3/2: 30 (10th visit in [place]).   ? Time 8   ? Period Weeks   ? Status Partially Met   ?  Target Date 01/12/22   ? ?  ?  ? ?  ? ? ? Plan - 12/03/21 1112   ? ? Clinical Impression Statement L shoulder AROM improving but marked increase in pain with shoulder flexion >100 deg.  Pt. instructed to focus on L shoulder AROM and daily walking with focus on consistent heel strike/ toe off.  Good hip/knee mobility with increase c/o L ankle pain during partial squats.  Pt. Continues to be limited by chronic pain/ progression of resisted there.ex.    ? Personal Factors and Comorbidities Comorbidity 3+   ? Examination-Activity Limitations Bathing;Bed Mobility;Bend;Carry;Dressing;Hygiene/Grooming;Lift;Locomotion Level;Sit;Squat;Stairs;Toileting;Stand   ? Examination-Participation Restrictions Cleaning;Laundry;Meal Prep;Community Activity   ? Stability/Clinical Decision Making Evolving/Moderate complexity   ? Clinical Decision Making Moderate   ? Rehab Potential Fair   ? PT Frequency 2x / week   ? PT Duration 8 weeks   ? PT Treatment/Interventions ADLs/Self Care Home Management;Aquatic Therapy;Cryotherapy;Iontophoresis 29m/ml Dexamethasone;Patient/family education;Neuromuscular re-education;Balance training;Therapeutic exercise;Therapeutic activities;Functional mobility training;Stair training;Gait training;Manual techniques;Passive range of motion;Dry needling;Electrical Stimulation   ? PT Next Visit Plan Continue with L shoulder manual tx. L ankle stability exercises.  Incorporate more ther.ex.   ? PT Home Exercise Plan [email or web address]   ? Consulted and Agree with Plan of Care Patient   ? ?  ?  ? ?  ? ? ? ?MPura Spice PT, DPT # [phone]?12/24/2021, 11:46 AM ? ?  u ?

## 2021-12-19 ENCOUNTER — Ambulatory Visit: Payer: Medicare Other | Admitting: Physical Therapy

## 2021-12-22 ENCOUNTER — Encounter: Payer: Medicare Other | Admitting: Physical Therapy

## 2021-12-23 ENCOUNTER — Ambulatory Visit: Payer: Medicare Other | Admitting: Physical Therapy

## 2021-12-23 DIAGNOSIS — M25572 Pain in left ankle and joints of left foot: Secondary | ICD-10-CM

## 2021-12-23 DIAGNOSIS — M2560 Stiffness of unspecified joint, not elsewhere classified: Secondary | ICD-10-CM

## 2021-12-23 DIAGNOSIS — M5441 Lumbago with sciatica, right side: Secondary | ICD-10-CM

## 2021-12-23 DIAGNOSIS — M25672 Stiffness of left ankle, not elsewhere classified: Secondary | ICD-10-CM

## 2021-12-23 DIAGNOSIS — M25612 Stiffness of left shoulder, not elsewhere classified: Secondary | ICD-10-CM

## 2021-12-23 DIAGNOSIS — M6281 Muscle weakness (generalized): Secondary | ICD-10-CM

## 2021-12-24 ENCOUNTER — Encounter: Payer: Self-pay | Admitting: Physical Therapy

## 2021-12-24 NOTE — Therapy (Addendum)
?OUTPATIENT PHYSICAL THERAPY TREATMENT NOTE ? ? ?Patient Name: Kelsey Bell ?MRN: [id number] ?DOB:[birth year], 41 y.o., female ?Today's Date: 12/24/2021 ? ?PCP: Ricardo Jericho, NP ?REFERRING PROVIDER: Anderson Malta, MD ? ? ? ? PT End of Session - 12/14/21 1301   ? ? Visit Number 27   ? Number of Visits 39   ? Date for PT Re-Evaluation 01/12/22   ? Authorization Type 4 of 10 CAID authorization (3/27 to 5/12)   ? Authorization - Visit Number 7   ? Authorization - Number of Visits 10   ? PT Start Time 1301 to 1403  (62 minutes)  ? Activity Tolerance Patient limited by pain;Patient tolerated treatment well   ? Behavior During Therapy [facility] for tasks assessed/performed   ? ?  ?  ? ?  ? ? PT End of Session - 12/24/21 1809   ? ? Visit Number 28   ? Number of Visits 39   ? Date for PT Re-Evaluation 01/12/22   ? Authorization Type 4 of 10 CAID authorization (3/27 to 5/12)   ? Authorization - Visit Number 8   ? Authorization - Number of Visits 10   ? PT Start Time 1327   ? PT Stop Time 1430   ? PT Time Calculation (min) 63 min   ? Activity Tolerance Patient limited by pain;Patient tolerated treatment well   ? Behavior During Therapy [facility] for tasks assessed/performed   ? ?  ?  ? ?  ? ? ? ?Past Medical History:  ?Diagnosis Date  ? Allergy   ? Anxiety   ? Arthritis   ? Asthma   ? Breast pain present for several months  ? Bil LT >RT across of breasts  ? CRPS (complex regional pain syndrome type I)   ? Depression   ? DVT (deep venous thrombosis) ([place]) 06/2018  ? Fibromyalgia   ? Kidney stones   ? LGSIL on Pap smear of cervix 2007  ? Migraine   ? Neuromuscular disorder ([place])   ? Complex regional pain syndrome and Fibromyalgia  ? Pancreatic pseudocyst   ? Pancreatitis   ? ?Past Surgical History:  ?Procedure Laterality Date  ? COLPOSCOPY  2017  ? neg bx  ? ERCP    ? EXTRACORPOREAL SHOCK WAVE LITHOTRIPSY Right 04/26/2017  ? Procedure: EXTRACORPOREAL SHOCK WAVE LITHOTRIPSY (ESWL);  Surgeon: Hollice Espy, MD;  Location:  [facility] ORS;  Service: Urology;  Laterality: Right;  ? GASTROSTOMY-JEJEUNOSTOMY TUBE CHANGE/PLACEMENT    ? KNEE ARTHROSCOPY Right   ? nj placement    ? TONSILLECTOMY    ? UPPER ESOPHAGEAL ENDOSCOPIC ULTRASOUND (EUS)    ? UPPER GASTROINTESTINAL ENDOSCOPY    ? ?Patient Active Problem List  ? Diagnosis Date Noted  ? Orthostatic syncope   ? Dehydration 06/09/2020  ? Genetic testing 10/24/2018  ? Idiopathic acute pancreatitis 07/22/2018  ? DVT (deep venous thrombosis) ([place]) 06/04/2018  ? Acute pancreatitis 05/27/2018  ? Right knee pain 05/19/2018  ? Peripheral edema 03/05/2018  ? Neuropathic pain 11/02/2017  ? Kidney stone 04/25/2017  ? Ureteral stone   ? Migraines 12/15/2016  ? Morbid obesity with BMI of 45.0-49.9, adult ([place]) 10/20/2015  ? Asthma, moderate 05/21/2015  ? Polyarthralgia 12/01/2011  ? Fibromyalgia 09/18/2011  ? Low back pain 09/18/2011  ? ? ?REFERRING DIAG: Sprain of L ankle/ L ankle pain/ L shoulder pain/ Low back pain ? ?THERAPY DIAG:  ?Pain in left ankle and joints of left foot ? ?Shoulder joint stiffness, left ? ?Acute  bilateral low back pain with bilateral sciatica ? ?Ankle joint stiffness, left ? ?Muscle weakness (generalized) ? ?Joint stiffness ? ?PERTINENT HISTORY: see evaluation ? ?PRECAUTIONS: N/A ? ?SUBJECTIVE:  Pt. Entered PT wearing L ankle brace today secondary to marked increase in L ankle swelling/ pain last night.  Pt. States she is unaware of why L ankle pain was so bad last night.  Pt. Reports abdominal MRI was negative and she continues to see MD to discuss symptoms.  Pt. Reports abdominal pain persists.  Pt. Reports a drop in blood sugar yesterday and is in discussion with MD concerning insulin pump.  Pt. States she is not eating enough and has started supplements.   ? ?PAIN:  ?Are you having pain? Yes: NPRS scale: 5/10 ?Pain location: Low Back (generalized/ bilateral) ?Pain description: excruciating ?Aggravating factors: movement ?Relieving factors: MH/ rest/  medications ? ? ? ? ?TODAY'S TREATMENT:  ?There.ex.: ? ?No Nustep today secondary to increase in L ankle pain.   ?  ?Standing B UE Nautilus: 30# lat. Pull downs/ 30# tricep extension/ 30# scapular retraction/ 30# (L foot in front) 20x each.   ? ?Seated weighted wand shoulder flexion (L shoulder pain).   ? ?Supine TrA ex.: marching/ bolster bridging (modified).   ? ?Discussed HEP  ?  ?   ?Manual tx.: ?    ?Prone CPA/UPA mobs. to thoracic spine as tolerated (central).  STM to L scapular/ posterior deltoid/ thoracic paraspinals.  MH to low back in prone.   ? ?Use of Hypervolt to thoracic/cervical paraspinal muscles in prone.  Less tenderness over L posterior deltoid/ scapular region and increase tenderness in B lumbar/ SI region.   ? ? ?PATIENT EDUCATION: ?Education details: HEP ?Person educated: Patient ?Education method: Explanation and Demonstration ?Education comprehension: verbalized understanding and returned demonstration ? ? ?HOME EXERCISE PROGRAM: ?[id number] ? ? ? ? PT Long Term Goals -  ? ?  ? PT LONG TERM GOAL #1  ? Title Pt will increase FOTO score to 56 to improve overall perceived functional ability.   ? Baseline IE: 27.  1/3: 27 (no change)- pain limited.  3/2: 71 (ankle/foot)- 20th visit.   ? Time 8   ? Period Weeks   ? Status Partially Met   ? Target Date 01/12/22   ?  ? PT LONG TERM GOAL #2  ? Title Pt will increase ankle strength by at least 1/2 MMT grade in order to demonstrate improvement in strength and functio   ? Baseline L ankle strength grossly 3+/5 MMT (pain limited).  R ankle strength 5/5 MMT.  L ankle remain pain limited.   ? Time 8   ? Period Weeks   ? Status Partially Met   ? Target Date 01/12/22   ?  ? PT LONG TERM GOAL #3  ? Title Pt will increase L ankle AROM to [facility] as compared to R ankle to improve functional mobility.   ? Baseline L/R ankle AROM: DF (-5/11 deg.), PF (44/58 deg.), IV (12/30 deg.), EV (15/15 deg.).   ? Time 8   ? Period Weeks   ? Status Partially Met   ? Target Date  01/12/22   ?  ? PT LONG TERM GOAL #4  ? Title Pt. will ambulate with normalized gait pattern and consistent heel strike with no increase c/o L ankle pain without use of ankle support/brace.   ? Baseline L antalgic gait pattern.  7/10 L ankle pain.   ? Time 8   ? Period Weeks   ?  Status Partially Met   ? Target Date 01/12/22   ?  ? PT LONG TERM GOAL #5  ? Title Pt. will increase L shoulder AROM to [facility] as compared to R shoulder to improve pain-free mobility/ ADL.   ? Baseline Supine L shoulder AROM: flexion 68 deg. (pain/guarded), abduction 66 deg. (pain), ER 32 deg., IR 85 deg.  Pt. unable to tolerate PROM to L shoulder at this time (>8/10 pain reported).  No MMT.   ? Time 8   ? Period Weeks   ? Status Not Met   ? Target Date 01/12/22   ?  ? PT LONG TERM GOAL #6  ? Title Pt. will increase FOTO to 58 to improve shoulder functional pain-free mobility.   ? Baseline Initial FOTO for shoulder: 43.  2/7: 54 (for shoulder on 5th visit).  3/2: 52 (10th visit in [place]).   ? Time 8   ? Period Weeks   ? Status Partially Met   ? Target Date 01/12/22   ? ?  ?  ? ?  ? ? ? Plan - 12/03/21 1112   ? ? Clinical Impression Statement PT tx. Session focused on L shoulder ROM/ strengthening.  Pt. Limited by L ankle pain with standing tasks.  No Nustep or standing hip ex. Secondary to L ankle pain.  Pt. instructed to focus on L shoulder AROM and daily walking with focus on consistent heel strike/ toe off.  Good hip/knee mobility with increase c/o L ankle pain during partial squats.  Pt. Continues to be limited by chronic pain/ progression of resisted there.ex.    ? Personal Factors and Comorbidities Comorbidity 3+   ? Examination-Activity Limitations Bathing;Bed Mobility;Bend;Carry;Dressing;Hygiene/Grooming;Lift;Locomotion Level;Sit;Squat;Stairs;Toileting;Stand   ? Examination-Participation Restrictions Cleaning;Laundry;Meal Prep;Community Activity   ? Stability/Clinical Decision Making Evolving/Moderate complexity   ? Clinical Decision  Making Moderate   ? Rehab Potential Fair   ? PT Frequency 2x / week   ? PT Duration 8 weeks   ? PT Treatment/Interventions ADLs/Self Care Home Management;Aquatic Therapy;Cryotherapy;Iontophoresis 7m/ml Dexamethasone;Patient/family educ

## 2021-12-26 ENCOUNTER — Ambulatory Visit: Payer: Medicare Other | Admitting: Physical Therapy

## 2021-12-28 ENCOUNTER — Ambulatory Visit: Payer: Medicare Other

## 2021-12-28 DIAGNOSIS — M25672 Stiffness of left ankle, not elsewhere classified: Secondary | ICD-10-CM

## 2021-12-28 DIAGNOSIS — M25572 Pain in left ankle and joints of left foot: Secondary | ICD-10-CM | POA: Diagnosis not present

## 2021-12-28 DIAGNOSIS — M25612 Stiffness of left shoulder, not elsewhere classified: Secondary | ICD-10-CM

## 2021-12-28 DIAGNOSIS — M2560 Stiffness of unspecified joint, not elsewhere classified: Secondary | ICD-10-CM

## 2021-12-28 DIAGNOSIS — M5441 Lumbago with sciatica, right side: Secondary | ICD-10-CM

## 2021-12-28 DIAGNOSIS — M6281 Muscle weakness (generalized): Secondary | ICD-10-CM

## 2021-12-28 NOTE — Therapy (Signed)
?OUTPATIENT PHYSICAL THERAPY TREATMENT NOTE ? ? ?Patient Name: Kelsey Bell ?MRN: [id number] ?DOB:1981/03/25, 41 y.o., female ?Today's Date: 12/28/2021 ? ?PCP: Ricardo Jericho, NP ?REFERRING PROVIDER: Anderson Malta, MD ? ? ? ? PT End of Session - 12/14/21 1301   ? ? Visit Number 27   ? Number of Visits 39   ? Date for PT Re-Evaluation 01/12/22   ? Authorization Type 4 of 10 CAID authorization (3/27 to 5/12)   ? Authorization - Visit Number 7   ? Authorization - Number of Visits 10   ? PT Start Time 1301 to 1403  (62 minutes)  ? Activity Tolerance Patient limited by pain;Patient tolerated treatment well   ? Behavior During Therapy [facility] for tasks assessed/performed   ? ?  ?  ? ?  ? ? PT End of Session - 12/28/21 1206   ? ? Visit Number 29   ? Number of Visits 39   ? Date for PT Re-Evaluation 01/12/22   ? Authorization Type 4 of 10 CAID authorization (3/27 to 5/12)   ? Authorization - Visit Number 9   ? Authorization - Number of Visits 10   ? PT Start Time [phone]   ? PT Stop Time 1030   ? PT Time Calculation (min) 42 min   ? Activity Tolerance Patient limited by pain;Patient tolerated treatment well   ? Behavior During Therapy [facility] for tasks assessed/performed   ? ?  ?  ? ?  ? ? ? ? ?Past Medical History:  ?Diagnosis Date  ? Allergy   ? Anxiety   ? Arthritis   ? Asthma   ? Breast pain present for several months  ? Bil LT >RT across of breasts  ? CRPS (complex regional pain syndrome type I)   ? Depression   ? DVT (deep venous thrombosis) ([place]) 06/2018  ? Fibromyalgia   ? Kidney stones   ? LGSIL on Pap smear of cervix 2007  ? Migraine   ? Neuromuscular disorder ([place])   ? Complex regional pain syndrome and Fibromyalgia  ? Pancreatic pseudocyst   ? Pancreatitis   ? ?Past Surgical History:  ?Procedure Laterality Date  ? COLPOSCOPY  2017  ? neg bx  ? ERCP    ? EXTRACORPOREAL SHOCK WAVE LITHOTRIPSY Right 04/26/2017  ? Procedure: EXTRACORPOREAL SHOCK WAVE LITHOTRIPSY (ESWL);  Surgeon: Hollice Espy, MD;  Location:  [facility] ORS;  Service: Urology;  Laterality: Right;  ? GASTROSTOMY-JEJEUNOSTOMY TUBE CHANGE/PLACEMENT    ? KNEE ARTHROSCOPY Right   ? nj placement    ? TONSILLECTOMY    ? UPPER ESOPHAGEAL ENDOSCOPIC ULTRASOUND (EUS)    ? UPPER GASTROINTESTINAL ENDOSCOPY    ? ?Patient Active Problem List  ? Diagnosis Date Noted  ? Orthostatic syncope   ? Dehydration 06/09/2020  ? Genetic testing 10/24/2018  ? Idiopathic acute pancreatitis 07/22/2018  ? DVT (deep venous thrombosis) ([place]) 06/04/2018  ? Acute pancreatitis 05/27/2018  ? Right knee pain 05/19/2018  ? Peripheral edema 03/05/2018  ? Neuropathic pain 11/02/2017  ? Kidney stone 04/25/2017  ? Ureteral stone   ? Migraines 12/15/2016  ? Morbid obesity with BMI of 45.0-49.9, adult ([place]) 10/20/2015  ? Asthma, moderate 05/21/2015  ? Polyarthralgia 12/01/2011  ? Fibromyalgia 09/18/2011  ? Low back pain 09/18/2011  ? ? ?REFERRING DIAG: Sprain of L ankle/ L ankle pain/ L shoulder pain/ Low back pain ? ?THERAPY DIAG:  ?Pain in left ankle and joints of left foot ? ?Shoulder joint stiffness, left ? ?  Acute bilateral low back pain with bilateral sciatica ? ?Ankle joint stiffness, left ? ?Muscle weakness (generalized) ? ?Joint stiffness ? ?PERTINENT HISTORY: see evaluation ? ?PRECAUTIONS: N/A ? ?SUBJECTIVE:  Pt. Reports her L shoulder pain is very "spicy" today due to babysitting tasks with lifting child and carrying heavy purse. Wanting to focus on decreasing L shoulder pain. ? ?PAIN:  ?Are you having pain? Yes  ?L shoulder 8/10 NPS ? ? ? ? ?TODAY'S TREATMENT:  ? ?12/28/21:  ? ? ?MH for 10 min for pain modulation on L shoulder during manual therapy. ? ?Manual Therapy:  ?Hook lying  ? ?Cervical rotation R/L + Upper trap stretch: 3x30 sec each ? ?STM for 8 min to L pectoralis for pain modulation and improved tissue extensibility with palpable trigger points with concordant pain with palpation. Pt reporting ease and "release" of trigger points with trigger point release. ? ?Prone cervical CPA's  and L sided UPA's grade 2 to 3 for pain modulation and joint mobility: 2x15 sec bouts C2-C7 and 1x15 sec bouts grade 3 for L sided UPA's ? ? ? ?There.ex:  ? Supine scap retractions with 5 sec holds: 2x10, 10 sec holds  ? Hook lying: chest press with dowel for gentle pectoralis major mobility: 2x15 ? ? ? ? ?PATIENT EDUCATION: ?Education details: Form/technique with exercise ?Person educated: Patient ?Education method: Explanation and Demonstration ?Education comprehension: verbalized understanding and returned demonstration ? ? ?HOME EXERCISE PROGRAM: ?[id number] ? ? ? ? PT Long Term Goals -  ? ?  ? PT LONG TERM GOAL #1  ? Title Pt will increase FOTO score to 56 to improve overall perceived functional ability.   ? Baseline IE: 27.  1/3: 27 (no change)- pain limited.  3/2: 85 (ankle/foot)- 20th visit.   ? Time 8   ? Period Weeks   ? Status Partially Met   ? Target Date 01/12/22   ?  ? PT LONG TERM GOAL #2  ? Title Pt will increase ankle strength by at least 1/2 MMT grade in order to demonstrate improvement in strength and functio   ? Baseline L ankle strength grossly 3+/5 MMT (pain limited).  R ankle strength 5/5 MMT.  L ankle remain pain limited.   ? Time 8   ? Period Weeks   ? Status Partially Met   ? Target Date 01/12/22   ?  ? PT LONG TERM GOAL #3  ? Title Pt will increase L ankle AROM to [facility] as compared to R ankle to improve functional mobility.   ? Baseline L/R ankle AROM: DF (-5/11 deg.), PF (44/58 deg.), IV (12/30 deg.), EV (15/15 deg.).   ? Time 8   ? Period Weeks   ? Status Partially Met   ? Target Date 01/12/22   ?  ? PT LONG TERM GOAL #4  ? Title Pt. will ambulate with normalized gait pattern and consistent heel strike with no increase c/o L ankle pain without use of ankle support/brace.   ? Baseline L antalgic gait pattern.  7/10 L ankle pain.   ? Time 8   ? Period Weeks   ? Status Partially Met   ? Target Date 01/12/22   ?  ? PT LONG TERM GOAL #5  ? Title Pt. will increase L shoulder AROM to [facility] as  compared to R shoulder to improve pain-free mobility/ ADL.   ? Baseline Supine L shoulder AROM: flexion 68 deg. (pain/guarded), abduction 66 deg. (pain), ER 32 deg., IR 85 deg.  Pt. unable to tolerate PROM to L shoulder at this time (>8/10 pain reported).  No MMT.   ? Time 8   ? Period Weeks   ? Status Not Met   ? Target Date 01/12/22   ?  ? PT LONG TERM GOAL #6  ? Title Pt. will increase FOTO to 58 to improve shoulder functional pain-free mobility.   ? Baseline Initial FOTO for shoulder: 43.  2/7: 54 (for shoulder on 5th visit).  3/2: 20 (10th visit in [place]).   ? Time 8   ? Period Weeks   ? Status Partially Met   ? Target Date 01/12/22   ? ?  ?  ? ?  ? ? ? Plan - 12/03/21 1112   ? ? Clinical Impression Statement Focus of session on improving L shoulder pain as pt reporting 8/10 pain in L shoulder. Able to utilize manual therapy interventions and therex to decrease shoulder pain to 6/10 NPS. Hard to decipher if radicular cervical issues causing shoulder pain or pectoralis tightness causing pain as cervical and pec specific manual interventions affecting L shoulder. Noted palpable trigger points with STM on L shoulder proximally near insertion. Pt will continue to benefit from skilled PT services to progress pain free mobility and strength.    ? Personal Factors and Comorbidities Comorbidity 3+   ? Examination-Activity Limitations Bathing;Bed Mobility;Bend;Carry;Dressing;Hygiene/Grooming;Lift;Locomotion Level;Sit;Squat;Stairs;Toileting;Stand   ? Examination-Participation Restrictions Cleaning;Laundry;Meal Prep;Community Activity   ? Stability/Clinical Decision Making Evolving/Moderate complexity   ? Clinical Decision Making Moderate   ? Rehab Potential Fair   ? PT Frequency 2x / week   ? PT Duration 8 weeks   ? PT Treatment/Interventions ADLs/Self Care Home Management;Aquatic Therapy;Cryotherapy;Iontophoresis 79m/ml Dexamethasone;Patient/family education;Neuromuscular re-education;Balance training;Therapeutic  exercise;Therapeutic activities;Functional mobility training;Stair training;Gait training;Manual techniques;Passive range of motion;Dry needling;Electrical Stimulation   ? PT Next Visit Plan Progress note  ? PT Home

## 2021-12-30 ENCOUNTER — Ambulatory Visit: Payer: Medicare Other | Admitting: Physical Therapy

## 2021-12-30 ENCOUNTER — Encounter: Payer: Self-pay | Admitting: Physical Therapy

## 2021-12-30 DIAGNOSIS — M5441 Lumbago with sciatica, right side: Secondary | ICD-10-CM

## 2021-12-30 DIAGNOSIS — M6281 Muscle weakness (generalized): Secondary | ICD-10-CM

## 2021-12-30 DIAGNOSIS — M25672 Stiffness of left ankle, not elsewhere classified: Secondary | ICD-10-CM

## 2021-12-30 DIAGNOSIS — M25572 Pain in left ankle and joints of left foot: Secondary | ICD-10-CM

## 2021-12-30 DIAGNOSIS — M2560 Stiffness of unspecified joint, not elsewhere classified: Secondary | ICD-10-CM

## 2021-12-30 DIAGNOSIS — M25612 Stiffness of left shoulder, not elsewhere classified: Secondary | ICD-10-CM

## 2021-12-30 NOTE — Therapy (Signed)
?OUTPATIENT PHYSICAL THERAPY TREATMENT NOTE ?Physical Therapy Progress Note ? ? ?Dates of reporting period  11/08/21  to  12/30/21  ? ? ?Patient Name: Kelsey Bell ?MRN: [id number] ?DOB:21-May-1981, 41 y.o., female ?Today's Date: 12/31/2021 ? ?PCP: Ricardo Jericho, NP ?REFERRING PROVIDER: Anderson Malta, MD ? ? ? PT End of Session - 12/30/21 1037   ? ? Visit Number 30   ? Number of Visits 39   ? Date for PT Re-Evaluation 01/12/22   ? Authorization Type 7 of 10 CAID authorization (3/27 to 5/12)   ? Authorization - Visit Number 10   ? Authorization - Number of Visits 10   ? PT Start Time 1038   ? PT Stop Time 1128   ? PT Time Calculation (min) 50 min   ? Activity Tolerance Patient limited by pain;Patient tolerated treatment well   ? Behavior During Therapy [facility] for tasks assessed/performed   ? ?  ?  ? ?  ? ? ? ? ?Past Medical History:  ?Diagnosis Date  ? Allergy   ? Anxiety   ? Arthritis   ? Asthma   ? Breast pain present for several months  ? Bil LT >RT across of breasts  ? CRPS (complex regional pain syndrome type I)   ? Depression   ? DVT (deep venous thrombosis) ([place]) 06/2018  ? Fibromyalgia   ? Kidney stones   ? LGSIL on Pap smear of cervix 2007  ? Migraine   ? Neuromuscular disorder ([place])   ? Complex regional pain syndrome and Fibromyalgia  ? Pancreatic pseudocyst   ? Pancreatitis   ? ?Past Surgical History:  ?Procedure Laterality Date  ? COLPOSCOPY  2017  ? neg bx  ? ERCP    ? EXTRACORPOREAL SHOCK WAVE LITHOTRIPSY Right 04/26/2017  ? Procedure: EXTRACORPOREAL SHOCK WAVE LITHOTRIPSY (ESWL);  Surgeon: Hollice Espy, MD;  Location: [facility] ORS;  Service: Urology;  Laterality: Right;  ? GASTROSTOMY-JEJEUNOSTOMY TUBE CHANGE/PLACEMENT    ? KNEE ARTHROSCOPY Right   ? nj placement    ? TONSILLECTOMY    ? UPPER ESOPHAGEAL ENDOSCOPIC ULTRASOUND (EUS)    ? UPPER GASTROINTESTINAL ENDOSCOPY    ? ?Patient Active Problem List  ? Diagnosis Date Noted  ? Orthostatic syncope   ? Dehydration 06/09/2020  ? Genetic testing  10/24/2018  ? Idiopathic acute pancreatitis 07/22/2018  ? DVT (deep venous thrombosis) ([place]) 06/04/2018  ? Acute pancreatitis 05/27/2018  ? Right knee pain 05/19/2018  ? Peripheral edema 03/05/2018  ? Neuropathic pain 11/02/2017  ? Kidney stone 04/25/2017  ? Ureteral stone   ? Migraines 12/15/2016  ? Morbid obesity with BMI of 45.0-49.9, adult ([place]) 10/20/2015  ? Asthma, moderate 05/21/2015  ? Polyarthralgia 12/01/2011  ? Fibromyalgia 09/18/2011  ? Low back pain 09/18/2011  ? ? ?REFERRING DIAG: Sprain of L ankle/ L ankle pain/ L shoulder pain/ Low back pain ? ?THERAPY DIAG:  ?Pain in left ankle and joints of left foot ? ?Shoulder joint stiffness, left ? ?Acute bilateral low back pain with bilateral sciatica ? ?Ankle joint stiffness, left ? ?Muscle weakness (generalized) ? ?Joint stiffness ? ?PERTINENT HISTORY: see evaluation ? ?PRECAUTIONS: N/A ? ?SUBJECTIVE:  Pt. Reports a "full body hurt" yesterday with increase soreness reported.  Pt. States neck is sore/hurting since last PT visit.  No L ankle issues reported and not wearing brace.   ? ?PAIN:  ?Are you having pain? Yes  ?L shoulder 7/10 NPS, 6/10 low cervical pain ? ? ?TODAY'S TREATMENT:  ? ?12/30/21: ? ?  Nustep L4 10 min. B UE/LE (consistent cadence/ discussed daily activities).  MH to low back.  ? ?Standing Nautilus:  30# lat. Pull down 15x2/ tricep extension 15x2/ sh. Adduction/ abduction with 20# handles (standing at side)- 15x2.   ? ?Supine 2# serratus punches/ tricep extension (good sh. Control at 90 deg.)- 20x each.  Supine L sh. Horizontal abduction/ adduction (no wt) 10x.   ? ?Seated thoracic rotn. 2x L/R ? ?Manual: ? ?Supine L shoulder stretches (all planes).   ? ?Prone STM to cervical/thoracic/lumbar paraspinals (use of hypervolt).  STM focus on L scapular/posterior deltoid region.   ? ?Seated cervical AROM WNL (all planes).  ? ? ? ?12/28/21:  ? ? ?MH for 10 min for pain modulation on L shoulder during manual therapy. ? ?Manual Therapy:  ?Hook lying   ? ?Cervical rotation R/L + Upper trap stretch: 3x30 sec each ? ?STM for 8 min to L pectoralis for pain modulation and improved tissue extensibility with palpable trigger points with concordant pain with palpation. Pt reporting ease and "release" of trigger points with trigger point release. ? ?Prone cervical CPA's and L sided UPA's grade 2 to 3 for pain modulation and joint mobility: 2x15 sec bouts C2-C7 and 1x15 sec bouts grade 3 for L sided UPA's ? ? ? ?There.ex:  ? Supine scap retractions with 5 sec holds: 2x10, 10 sec holds  ? Hook lying: chest press with dowel for gentle pectoralis major mobility: 2x15 ? ? ? ? ?PATIENT EDUCATION: ?Education details: Form/technique with exercise ?Person educated: Patient ?Education method: Explanation and Demonstration ?Education comprehension: verbalized understanding and returned demonstration ? ? ?HOME EXERCISE PROGRAM: ?[id number] ? ? ? ? PT Long Term Goals -  ? ?  ? PT LONG TERM GOAL #1  ? Title Pt will increase FOTO score to 56 to improve overall perceived functional ability.   ? Baseline IE: 27.  1/3: 27 (no change)- pain limited.  3/2: 34 (ankle/foot)- 20th visit.   ? Time 8   ? Period Weeks   ? Status Partially Met   ? Target Date 01/12/22   ?  ? PT LONG TERM GOAL #2  ? Title Pt will increase ankle strength by at least 1/2 MMT grade in order to demonstrate improvement in strength and functio   ? Baseline L ankle strength grossly 3+/5 MMT (pain limited).  R ankle strength 5/5 MMT.  L ankle remain pain limited.   ? Time 8   ? Period Weeks   ? Status Partially Met   ? Target Date 01/12/22   ?  ? PT LONG TERM GOAL #3  ? Title Pt will increase L ankle AROM to [facility] as compared to R ankle to improve functional mobility.   ? Baseline L/R ankle AROM: DF (-5/11 deg.), PF (44/58 deg.), IV (12/30 deg.), EV (15/15 deg.).   ? Time 8   ? Period Weeks   ? Status Partially Met   ? Target Date 01/12/22   ?  ? PT LONG TERM GOAL #4  ? Title Pt. will ambulate with normalized gait pattern and  consistent heel strike with no increase c/o L ankle pain without use of ankle support/brace.   ? Baseline L antalgic gait pattern.  7/10 L ankle pain.   ? Time 8   ? Period Weeks   ? Status Partially Met   ? Target Date 01/12/22   ?  ? PT LONG TERM GOAL #5  ? Title Pt. will increase L shoulder AROM to  WFL as compared to R shoulder to improve pain-free mobility/ ADL.   ? Baseline Supine L shoulder AROM: flexion 68 deg. (pain/guarded), abduction 66 deg. (pain), ER 32 deg., IR 85 deg.  Pt. unable to tolerate PROM to L shoulder at this time (>8/10 pain reported).  No MMT.   ? Time 8   ? Period Weeks   ? Status Not Met   ? Target Date 01/12/22   ?  ? PT LONG TERM GOAL #6  ? Title Pt. will increase FOTO to 58 to improve shoulder functional pain-free mobility.   ? Baseline Initial FOTO for shoulder: 43.  2/7: 54 (for shoulder on 5th visit).  3/2: 40 (10th visit in [place]).   ? Time 8   ? Period Weeks   ? Status Partially Met   ? Target Date 01/12/22   ? ?  ?  ? ?  ? ? ? Plan - 12/03/21 1112   ? ? Clinical Impression Statement PT tx. Focus on there.ex./ generalized B UE strengthening with good technique/ upright posture.  Pt. Ambulates with more normalized gait pattern with no L antalgic issues noted. L pec. Muscle tightness noted during horizontal abduction in supine. Pt will continue to benefit from skilled PT services to progress pain free mobility and strength.    ? Personal Factors and Comorbidities Comorbidity 3+   ? Examination-Activity Limitations Bathing;Bed Mobility;Bend;Carry;Dressing;Hygiene/Grooming;Lift;Locomotion Level;Sit;Squat;Stairs;Toileting;Stand   ? Examination-Participation Restrictions Cleaning;Laundry;Meal Prep;Community Activity   ? Stability/Clinical Decision Making Evolving/Moderate complexity   ? Clinical Decision Making Moderate   ? Rehab Potential Fair   ? PT Frequency 2x / week   ? PT Duration 8 weeks   ? PT Treatment/Interventions ADLs/Self Care Home Management;Aquatic  Therapy;Cryotherapy;Iontophoresis 26m/ml Dexamethasone;Patient/family education;Neuromuscular re-education;Balance training;Therapeutic exercise;Therapeutic activities;Functional mobility training;Stair training;Gait training;[place]

## 2022-01-03 ENCOUNTER — Ambulatory Visit: Payer: Medicare Other | Attending: Orthopedic Surgery | Admitting: Physical Therapy

## 2022-01-03 ENCOUNTER — Encounter: Payer: Self-pay | Admitting: Physical Therapy

## 2022-01-03 DIAGNOSIS — M5441 Lumbago with sciatica, right side: Secondary | ICD-10-CM | POA: Diagnosis present

## 2022-01-03 DIAGNOSIS — M5442 Lumbago with sciatica, left side: Secondary | ICD-10-CM | POA: Diagnosis present

## 2022-01-03 DIAGNOSIS — M25572 Pain in left ankle and joints of left foot: Secondary | ICD-10-CM | POA: Diagnosis present

## 2022-01-03 DIAGNOSIS — M25672 Stiffness of left ankle, not elsewhere classified: Secondary | ICD-10-CM | POA: Insufficient documentation

## 2022-01-03 DIAGNOSIS — M6281 Muscle weakness (generalized): Secondary | ICD-10-CM | POA: Diagnosis present

## 2022-01-03 DIAGNOSIS — M2560 Stiffness of unspecified joint, not elsewhere classified: Secondary | ICD-10-CM | POA: Diagnosis present

## 2022-01-03 DIAGNOSIS — M25612 Stiffness of left shoulder, not elsewhere classified: Secondary | ICD-10-CM | POA: Insufficient documentation

## 2022-01-03 NOTE — Therapy (Addendum)
?OUTPATIENT PHYSICAL THERAPY TREATMENT NOTE ? ? ?Patient Name: Kelsey Bell ?MRN: [id number] ?DOB:[birth year], 41 y.o., female ?Today's Date: 01/05/2022 ? ?PCP: Ricardo Jericho, NP ?REFERRING PROVIDER: Anderson Malta, MD ? ? ? ? ? PT End of Session - 01/03/22 1518   ? ? Visit Number 31   ? Number of Visits 39   ? Date for PT Re-Evaluation 01/12/22   ? Authorization Type 8 of 10 CAID authorization (3/27 to 5/12)   ? Authorization - Visit Number 1   ? Authorization - Number of Visits 10   ? PT Start Time 3295 to 1604  ? Activity Tolerance Patient limited by pain;Patient tolerated treatment well   ? Behavior During Therapy [facility] for tasks assessed/performed   ? ?  ?  ? ? ? ? ? ?Past Medical History:  ?Diagnosis Date  ? Allergy   ? Anxiety   ? Arthritis   ? Asthma   ? Breast pain present for several months  ? Bil LT >RT across of breasts  ? CRPS (complex regional pain syndrome type I)   ? Depression   ? DVT (deep venous thrombosis) ([place]) 06/2018  ? Fibromyalgia   ? Kidney stones   ? LGSIL on Pap smear of cervix 2007  ? Migraine   ? Neuromuscular disorder ([place])   ? Complex regional pain syndrome and Fibromyalgia  ? Pancreatic pseudocyst   ? Pancreatitis   ? ?Past Surgical History:  ?Procedure Laterality Date  ? COLPOSCOPY  2017  ? neg bx  ? ERCP    ? EXTRACORPOREAL SHOCK WAVE LITHOTRIPSY Right 04/26/2017  ? Procedure: EXTRACORPOREAL SHOCK WAVE LITHOTRIPSY (ESWL);  Surgeon: Hollice Espy, MD;  Location: [facility] ORS;  Service: Urology;  Laterality: Right;  ? GASTROSTOMY-JEJEUNOSTOMY TUBE CHANGE/PLACEMENT    ? KNEE ARTHROSCOPY Right   ? nj placement    ? TONSILLECTOMY    ? UPPER ESOPHAGEAL ENDOSCOPIC ULTRASOUND (EUS)    ? UPPER GASTROINTESTINAL ENDOSCOPY    ? ?Patient Active Problem List  ? Diagnosis Date Noted  ? Orthostatic syncope   ? Dehydration 06/09/2020  ? Genetic testing 10/24/2018  ? Idiopathic acute pancreatitis 07/22/2018  ? DVT (deep venous thrombosis) ([place]) 06/04/2018  ? Acute pancreatitis 05/27/2018  ?  Right knee pain 05/19/2018  ? Peripheral edema 03/05/2018  ? Neuropathic pain 11/02/2017  ? Kidney stone 04/25/2017  ? Ureteral stone   ? Migraines 12/15/2016  ? Morbid obesity with BMI of 45.0-49.9, adult ([place]) 10/20/2015  ? Asthma, moderate 05/21/2015  ? Polyarthralgia 12/01/2011  ? Fibromyalgia 09/18/2011  ? Low back pain 09/18/2011  ? ? ?REFERRING DIAG: Sprain of L ankle/ L ankle pain/ L shoulder pain/ Low back pain ? ?THERAPY DIAG:  ?Pain in left ankle and joints of left foot ? ?Shoulder joint stiffness, left ? ?Acute bilateral low back pain with bilateral sciatica ? ?Ankle joint stiffness, left ? ?Muscle weakness (generalized) ? ?Joint stiffness ? ?PERTINENT HISTORY: see evaluation ? ?PRECAUTIONS: N/A ? ?SUBJECTIVE:  Pt. C/o more L shoulder pain today than previous tx.  Pt. C/o B anterior thigh to ankle pain walking into PT tx. Session.   ? ?PAIN:  ?Are you having pain? Yes  ?L shoulder 7/10 NPS, 6/10 low cervical pain ? ? ?TODAY'S TREATMENT:  ?01/03/22: ? ?There.ex.: ? ?Nustep L4-2 B LE/ R UE (no L UE secondary to shoulder discomfort today).  10 min at a consistent cadence. ? ?Reassessment of B shoulder AROM in standing.   ? ?Supine wand (3#) ex.: chest  press/ shoulder flexion with elbow extended (pain limited)/ tricep extension 20x each (modified as tolerated).   ? ?Supine L serratus punches 10x. ? ?Manual tx.: ? ?Supine L shoulder AA/PROM all planes in pain tolerable range. ? ?Prone STM to B UT/scapular region/ lumbar paraspinals (use of Hypervolt).  Pt. More sensitive with manual STM, as compared to Hypervolt).   ? ? ? ? ?12/30/21: ? ?Nustep L4 10 min. B UE/LE (consistent cadence/ discussed daily activities).  MH to low back.  ? ?Standing Nautilus:  30# lat. Pull down 15x2/ tricep extension 15x2/ sh. Adduction/ abduction with 20# handles (standing at side)- 15x2.   ? ?Supine 2# serratus punches/ tricep extension (good sh. Control at 90 deg.)- 20x each.  Supine L sh. Horizontal abduction/ adduction (no wt)  10x.   ? ?Seated thoracic rotn. 2x L/R ? ?Manual: ? ?Supine L shoulder stretches (all planes).   ? ?Prone STM to cervical/thoracic/lumbar paraspinals (use of hypervolt).  STM focus on L scapular/posterior deltoid region.   ? ?Seated cervical AROM WNL (all planes).  ? ? ? ?12/28/21:  ? ? ?MH for 10 min for pain modulation on L shoulder during manual therapy. ? ?Manual Therapy:  ?Hook lying  ? ?Cervical rotation R/L + Upper trap stretch: 3x30 sec each ? ?STM for 8 min to L pectoralis for pain modulation and improved tissue extensibility with palpable trigger points with concordant pain with palpation. Pt reporting ease and "release" of trigger points with trigger point release. ? ?Prone cervical CPA's and L sided UPA's grade 2 to 3 for pain modulation and joint mobility: 2x15 sec bouts C2-C7 and 1x15 sec bouts grade 3 for L sided UPA's ? ? ? ?There.ex:  ? Supine scap retractions with 5 sec holds: 2x10, 10 sec holds  ? Hook lying: chest press with dowel for gentle pectoralis major mobility: 2x15 ? ? ? ? ?PATIENT EDUCATION: ?Education details: Form/technique with exercise ?Person educated: Patient ?Education method: Explanation and Demonstration ?Education comprehension: verbalized understanding and returned demonstration ? ? ?HOME EXERCISE PROGRAM: ?[id number] ? ? ? ? PT Long Term Goals -  ? ?  ? PT LONG TERM GOAL #1  ? Title Pt will increase FOTO score to 56 to improve overall perceived functional ability.   ? Baseline IE: 27.  1/3: 27 (no change)- pain limited.  3/2: 30 (ankle/foot)- 20th visit.   ? Time 8   ? Period Weeks   ? Status Partially Met   ? Target Date 01/12/22   ?  ? PT LONG TERM GOAL #2  ? Title Pt will increase ankle strength by at least 1/2 MMT grade in order to demonstrate improvement in strength and functio   ? Baseline L ankle strength grossly 3+/5 MMT (pain limited).  R ankle strength 5/5 MMT.  L ankle remain pain limited.   ? Time 8   ? Period Weeks   ? Status Partially Met   ? Target Date 01/12/22    ?  ? PT LONG TERM GOAL #3  ? Title Pt will increase L ankle AROM to [facility] as compared to R ankle to improve functional mobility.   ? Baseline L/R ankle AROM: DF (-5/11 deg.), PF (44/58 deg.), IV (12/30 deg.), EV (15/15 deg.).   ? Time 8   ? Period Weeks   ? Status Partially Met   ? Target Date 01/12/22   ?  ? PT LONG TERM GOAL #4  ? Title Pt. will ambulate with normalized gait pattern and consistent heel  strike with no increase c/o L ankle pain without use of ankle support/brace.   ? Baseline L antalgic gait pattern.  7/10 L ankle pain.   ? Time 8   ? Period Weeks   ? Status Partially Met   ? Target Date 01/12/22   ?  ? PT LONG TERM GOAL #5  ? Title Pt. will increase L shoulder AROM to [facility] as compared to R shoulder to improve pain-free mobility/ ADL.   ? Baseline Supine L shoulder AROM: flexion 68 deg. (pain/guarded), abduction 66 deg. (pain), ER 32 deg., IR 85 deg.  Pt. unable to tolerate PROM to L shoulder at this time (>8/10 pain reported).  No MMT.   ? Time 8   ? Period Weeks   ? Status Not Met   ? Target Date 01/12/22   ?  ? PT LONG TERM GOAL #6  ? Title Pt. will increase FOTO to 58 to improve shoulder functional pain-free mobility.   ? Baseline Initial FOTO for shoulder: 43.  2/7: 54 (for shoulder on 5th visit).  3/2: 37 (10th visit in [place]).   ? Time 8   ? Period Weeks   ? Status Partially Met   ? Target Date 01/12/22   ? ?  ?  ? ?  ? ? ? Plan - 12/03/21 1112   ? ? Clinical Impression Statement L shoulder pain limited progression with tx. Session today.  Pt. Remains guarded with L shoulder flexion/ horizontal abduction in supine and standing posture. No change to HEP.   Pt will continue to benefit from skilled PT services to progress pain free mobility and strength.    ? Personal Factors and Comorbidities Comorbidity 3+   ? Examination-Activity Limitations Bathing;Bed Mobility;Bend;Carry;Dressing;Hygiene/Grooming;Lift;Locomotion Level;Sit;Squat;Stairs;Toileting;Stand   ? Examination-Participation Restrictions  Cleaning;Laundry;Meal Prep;Community Activity   ? Stability/Clinical Decision Making Evolving/Moderate complexity   ? Clinical Decision Making Moderate   ? Rehab Potential Fair   ? PT Frequency 2x / week

## 2022-01-05 ENCOUNTER — Ambulatory Visit: Payer: Medicare Other | Admitting: Physical Therapy

## 2022-01-05 ENCOUNTER — Encounter: Payer: Self-pay | Admitting: Physical Therapy

## 2022-01-05 DIAGNOSIS — M25572 Pain in left ankle and joints of left foot: Secondary | ICD-10-CM

## 2022-01-05 DIAGNOSIS — M6281 Muscle weakness (generalized): Secondary | ICD-10-CM

## 2022-01-05 DIAGNOSIS — M25672 Stiffness of left ankle, not elsewhere classified: Secondary | ICD-10-CM

## 2022-01-05 DIAGNOSIS — M25612 Stiffness of left shoulder, not elsewhere classified: Secondary | ICD-10-CM

## 2022-01-05 DIAGNOSIS — M5441 Lumbago with sciatica, right side: Secondary | ICD-10-CM

## 2022-01-05 DIAGNOSIS — M2560 Stiffness of unspecified joint, not elsewhere classified: Secondary | ICD-10-CM

## 2022-01-05 NOTE — Therapy (Signed)
?OUTPATIENT PHYSICAL THERAPY TREATMENT NOTE ? ? ?Patient Name: Kelsey Bell ?MRN: [id number] ?DOB:02-23-1981, 41 y.o., female ?Today's Date: 01/06/2022 ? ?PCP: Ricardo Jericho, NP ?REFERRING PROVIDER: Anderson Malta, MD ? ? ? PT End of Session - 01/05/22 1305   ? ? Visit Number 32   ? Number of Visits 39   ? Date for PT Re-Evaluation 01/12/22   ? Authorization Type 8 of 10 CAID authorization (3/27 to 5/12)   ? Authorization - Visit Number 2   ? Authorization - Number of Visits 10   ? PT Start Time 1258   ? PT Stop Time 1348   ? PT Time Calculation (min) 50 min   ? Activity Tolerance Patient limited by pain;Patient tolerated treatment well   ? Behavior During Therapy [facility] for tasks assessed/performed   ? ?  ?  ? ?  ? ? ? ?Past Medical History:  ?Diagnosis Date  ? Allergy   ? Anxiety   ? Arthritis   ? Asthma   ? Breast pain present for several months  ? Bil LT >RT across of breasts  ? CRPS (complex regional pain syndrome type I)   ? Depression   ? DVT (deep venous thrombosis) ([place]) 06/2018  ? Fibromyalgia   ? Kidney stones   ? LGSIL on Pap smear of cervix 2007  ? Migraine   ? Neuromuscular disorder ([place])   ? Complex regional pain syndrome and Fibromyalgia  ? Pancreatic pseudocyst   ? Pancreatitis   ? ?Past Surgical History:  ?Procedure Laterality Date  ? COLPOSCOPY  2017  ? neg bx  ? ERCP    ? EXTRACORPOREAL SHOCK WAVE LITHOTRIPSY Right 04/26/2017  ? Procedure: EXTRACORPOREAL SHOCK WAVE LITHOTRIPSY (ESWL);  Surgeon: Hollice Espy, MD;  Location: [facility] ORS;  Service: Urology;  Laterality: Right;  ? GASTROSTOMY-JEJEUNOSTOMY TUBE CHANGE/PLACEMENT    ? KNEE ARTHROSCOPY Right   ? nj placement    ? TONSILLECTOMY    ? UPPER ESOPHAGEAL ENDOSCOPIC ULTRASOUND (EUS)    ? UPPER GASTROINTESTINAL ENDOSCOPY    ? ?Patient Active Problem List  ? Diagnosis Date Noted  ? Orthostatic syncope   ? Dehydration 06/09/2020  ? Genetic testing 10/24/2018  ? Idiopathic acute pancreatitis 07/22/2018  ? DVT (deep venous thrombosis)  ([place]) 06/04/2018  ? Acute pancreatitis 05/27/2018  ? Right knee pain 05/19/2018  ? Peripheral edema 03/05/2018  ? Neuropathic pain 11/02/2017  ? Kidney stone 04/25/2017  ? Ureteral stone   ? Migraines 12/15/2016  ? Morbid obesity with BMI of 45.0-49.9, adult ([place]) 10/20/2015  ? Asthma, moderate 05/21/2015  ? Polyarthralgia 12/01/2011  ? Fibromyalgia 09/18/2011  ? Low back pain 09/18/2011  ? ? ?REFERRING DIAG: Sprain of L ankle/ L ankle pain/ L shoulder pain/ Low back pain ? ?THERAPY DIAG:  ?Pain in left ankle and joints of left foot ? ?Shoulder joint stiffness, left ? ?Acute bilateral low back pain with bilateral sciatica ? ?Ankle joint stiffness, left ? ?Muscle weakness (generalized) ? ?Joint stiffness ? ?PERTINENT HISTORY: see evaluation ? ?PRECAUTIONS: N/A ? ?SUBJECTIVE:  Pt. Reports L "ankle is hurting bad."  Pt. Reports 6.5/10 L ankle pain.  Pt. States L shoulder/ "whole arm" is also a 6.5/10 pain prior to tx. Session.  Pt. Is waiting to receive Medicare card before joining [employer].   ? ?PAIN:  ?Are you having pain? Yes  ?L shoulder 6.5/10 NPS, 6.5/10 L ankle ? ? ?TODAY'S TREATMENT:  ? ?01/05/22: ? ?There.ex.: ? ?Nustep L4 B UE/LE 10  min. (Discussed drop in blood sugar last night)- pt. States she is doing to [place] this weekend for [place].   ? ?BOSU static lunges on L/R with light UE on //-bars 8x each ? ?Standing partial squats at //-bars (cuing for technique)- chair behind for safety. ? ?Resisted gait (lateral L/R)- 5x each with 2BTB. ? ?Nautilus: seated 40# lat. Pull down 25x/ 30# tricep extension 25x/ 30# shoulder extension- 15x2.   ? ?Supine L shoulder 3# serratus punches/ 3# bicep curls 20x.   ? ?Manual tx.: ? ?Supine L shoulder AA/PROM all planes (full ROM with discomfort at end-range flexion). ? ?Seated STM to L UT/cervical paraspinals/ scapular musculature (hypervolt).   ? ? ? ?01/03/22: ? ?There.ex.: ? ?Nustep L4-2 B LE/ R UE (no L UE secondary to shoulder discomfort today).  10 min  at a consistent cadence. ? ?Reassessment of B shoulder AROM in standing.   ? ?Supine wand (3#) ex.: chest press/ shoulder flexion with elbow extended (pain limited)/ tricep extension 20x each (modified as tolerated).   ? ?Supine L serratus punches 10x. ? ?Manual tx.: ? ?Supine L shoulder AA/PROM all planes in pain tolerable range. ? ?Prone STM to B UT/scapular region/ lumbar paraspinals (use of Hypervolt).  Pt. More sensitive with manual STM, as compared to Hypervolt).   ? ? ? ? ?12/30/21: ? ?Nustep L4 10 min. B UE/LE (consistent cadence/ discussed daily activities).  MH to low back.  ? ?Standing Nautilus:  30# lat. Pull down 15x2/ tricep extension 15x2/ sh. Adduction/ abduction with 20# handles (standing at side)- 15x2.   ? ?Supine 2# serratus punches/ tricep extension (good sh. Control at 90 deg.)- 20x each.  Supine L sh. Horizontal abduction/ adduction (no wt) 10x.   ? ?Seated thoracic rotn. 2x L/R ? ?Manual: ? ?Supine L shoulder stretches (all planes).   ? ?Prone STM to cervical/thoracic/lumbar paraspinals (use of hypervolt).  STM focus on L scapular/posterior deltoid region.   ? ?Seated cervical AROM WNL (all planes).  ? ? ? ?12/28/21:  ? ? ?MH for 10 min for pain modulation on L shoulder during manual therapy. ? ?Manual Therapy:  ?Hook lying  ? ?Cervical rotation R/L + Upper trap stretch: 3x30 sec each ? ?STM for 8 min to L pectoralis for pain modulation and improved tissue extensibility with palpable trigger points with concordant pain with palpation. Pt reporting ease and "release" of trigger points with trigger point release. ? ?Prone cervical CPA's and L sided UPA's grade 2 to 3 for pain modulation and joint mobility: 2x15 sec bouts C2-C7 and 1x15 sec bouts grade 3 for L sided UPA's ? ? ? ?There.ex:  ? Supine scap retractions with 5 sec holds: 2x10, 10 sec holds  ? Hook lying: chest press with dowel for gentle pectoralis major mobility: 2x15 ? ? ? ? ?PATIENT EDUCATION: ?Education details: Form/technique with  exercise ?Person educated: Patient ?Education method: Explanation and Demonstration ?Education comprehension: verbalized understanding and returned demonstration ? ? ?HOME EXERCISE PROGRAM: ?[id number] ? ? ? ? PT Long Term Goals -  ? ?  ? PT LONG TERM GOAL #1  ? Title Pt will increase FOTO score to 56 to improve overall perceived functional ability.   ? Baseline IE: 27.  1/3: 27 (no change)- pain limited.  3/2: 67 (ankle/foot)- 20th visit.   ? Time 8   ? Period Weeks   ? Status Partially Met   ? Target Date 01/12/22   ?  ? PT LONG TERM GOAL #2  ?  Title Pt will increase ankle strength by at least 1/2 MMT grade in order to demonstrate improvement in strength and functio   ? Baseline L ankle strength grossly 3+/5 MMT (pain limited).  R ankle strength 5/5 MMT.  L ankle remain pain limited.   ? Time 8   ? Period Weeks   ? Status Partially Met   ? Target Date 01/12/22   ?  ? PT LONG TERM GOAL #3  ? Title Pt will increase L ankle AROM to [facility] as compared to R ankle to improve functional mobility.   ? Baseline L/R ankle AROM: DF (-5/11 deg.), PF (44/58 deg.), IV (12/30 deg.), EV (15/15 deg.).   ? Time 8   ? Period Weeks   ? Status Partially Met   ? Target Date 01/12/22   ?  ? PT LONG TERM GOAL #4  ? Title Pt. will ambulate with normalized gait pattern and consistent heel strike with no increase c/o L ankle pain without use of ankle support/brace.   ? Baseline L antalgic gait pattern.  7/10 L ankle pain.   ? Time 8   ? Period Weeks   ? Status Partially Met   ? Target Date 01/12/22   ?  ? PT LONG TERM GOAL #5  ? Title Pt. will increase L shoulder AROM to [facility] as compared to R shoulder to improve pain-free mobility/ ADL.   ? Baseline Supine L shoulder AROM: flexion 68 deg. (pain/guarded), abduction 66 deg. (pain), ER 32 deg., IR 85 deg.  Pt. unable to tolerate PROM to L shoulder at this time (>8/10 pain reported).  No MMT.   ? Time 8   ? Period Weeks   ? Status Not Met   ? Target Date 01/12/22   ?  ? PT LONG TERM GOAL #6  ? Title  Pt. will increase FOTO to 58 to improve shoulder functional pain-free mobility.   ? Baseline Initial FOTO for shoulder: 43.  2/7: 54 (for shoulder on 5th visit).  3/2: 67 (10th visit in [place]).   ? Time 8

## 2022-01-09 ENCOUNTER — Ambulatory Visit: Payer: Medicare Other | Admitting: Physical Therapy

## 2022-01-09 ENCOUNTER — Encounter: Payer: Self-pay | Admitting: Physical Therapy

## 2022-01-09 DIAGNOSIS — M5441 Lumbago with sciatica, right side: Secondary | ICD-10-CM

## 2022-01-09 DIAGNOSIS — M6281 Muscle weakness (generalized): Secondary | ICD-10-CM

## 2022-01-09 DIAGNOSIS — M25672 Stiffness of left ankle, not elsewhere classified: Secondary | ICD-10-CM

## 2022-01-09 DIAGNOSIS — M25572 Pain in left ankle and joints of left foot: Secondary | ICD-10-CM | POA: Diagnosis not present

## 2022-01-09 DIAGNOSIS — M25612 Stiffness of left shoulder, not elsewhere classified: Secondary | ICD-10-CM

## 2022-01-09 NOTE — Therapy (Signed)
?OUTPATIENT PHYSICAL THERAPY TREATMENT NOTE ? ? ?Patient Name: Kelsey Bell ?MRN: [id number] ?DOB:[birth year], 41 y.o., female ?Today's Date: 01/09/2022 ? ?PCP: Ricardo Jericho, NP ?REFERRING PROVIDER: Anderson Malta, MD ? ? ? PT End of Session - 01/09/22 1246   ? ? Visit Number 33   ? Number of Visits 39   ? Date for PT Re-Evaluation 01/12/22   ? Authorization Type 9 of 10 CAID authorization (3/27 to 5/12)   ? Authorization - Visit Number 3   ? Authorization - Number of Visits 10   ? PT Start Time 1119   ? PT Stop Time 1210   ? PT Time Calculation (min) 51 min   ? Activity Tolerance Patient limited by pain;Patient tolerated treatment well   ? Behavior During Therapy [facility] for tasks assessed/performed   ? ?  ?  ? ?  ? ? ? ?Past Medical History:  ?Diagnosis Date  ? Allergy   ? Anxiety   ? Arthritis   ? Asthma   ? Breast pain present for several months  ? Bil LT >RT across of breasts  ? CRPS (complex regional pain syndrome type I)   ? Depression   ? DVT (deep venous thrombosis) ([place]) 06/2018  ? Fibromyalgia   ? Kidney stones   ? LGSIL on Pap smear of cervix 2007  ? Migraine   ? Neuromuscular disorder ([place])   ? Complex regional pain syndrome and Fibromyalgia  ? Pancreatic pseudocyst   ? Pancreatitis   ? ?Past Surgical History:  ?Procedure Laterality Date  ? COLPOSCOPY  2017  ? neg bx  ? ERCP    ? EXTRACORPOREAL SHOCK WAVE LITHOTRIPSY Right 04/26/2017  ? Procedure: EXTRACORPOREAL SHOCK WAVE LITHOTRIPSY (ESWL);  Surgeon: Hollice Espy, MD;  Location: [facility] ORS;  Service: Urology;  Laterality: Right;  ? GASTROSTOMY-JEJEUNOSTOMY TUBE CHANGE/PLACEMENT    ? KNEE ARTHROSCOPY Right   ? nj placement    ? TONSILLECTOMY    ? UPPER ESOPHAGEAL ENDOSCOPIC ULTRASOUND (EUS)    ? UPPER GASTROINTESTINAL ENDOSCOPY    ? ?Patient Active Problem List  ? Diagnosis Date Noted  ? Orthostatic syncope   ? Dehydration 06/09/2020  ? Genetic testing 10/24/2018  ? Idiopathic acute pancreatitis 07/22/2018  ? DVT (deep venous thrombosis)  ([place]) 06/04/2018  ? Acute pancreatitis 05/27/2018  ? Right knee pain 05/19/2018  ? Peripheral edema 03/05/2018  ? Neuropathic pain 11/02/2017  ? Kidney stone 04/25/2017  ? Ureteral stone   ? Migraines 12/15/2016  ? Morbid obesity with BMI of 45.0-49.9, adult ([place]) 10/20/2015  ? Asthma, moderate 05/21/2015  ? Polyarthralgia 12/01/2011  ? Fibromyalgia 09/18/2011  ? Low back pain 09/18/2011  ? ? ?REFERRING DIAG: Sprain of L ankle/ L ankle pain/ L shoulder pain/ Low back pain ? ?THERAPY DIAG:  ?Pain in left ankle and joints of left foot ? ?Shoulder joint stiffness, left ? ?Acute bilateral low back pain with bilateral sciatica ? ?Ankle joint stiffness, left ? ?Muscle weakness (generalized) ? ?PERTINENT HISTORY: see evaluation ? ?PRECAUTIONS: N/A ? ?SUBJECTIVE:  Pt. Reports having a headache all weekend ("probably sinus related").  Pt. States she slept 14 hours yesterday and was hurting everywhere.  Pt. States she received her insurance card and will be looking into joining [employer].     ? ?PAIN:  ?Are you having pain? Yes  ?L shoulder 6.5/10 NPS, 6.5/10 L ankle ? ? ?TODAY'S TREATMENT: ? ?01/09/22: ? ?Nustep L4-3 10 min. B UE/LE (consistent cadence/ discussed daily activities).  [place]  to low back.  ? ?TRX squats at chair 10x.  Full B elbow extension during squats. ? ?Tandem gait (challenged with occasional use of //-bars to maintain balance)- forward/backwards.  Lateral walking with added braiding.  Slight increase in L ankle discomfort during braiding to L.   ? ?Supine L shoulder AROM (all planes). ? ?Supine TrA ex.: marching/ bicycles/ bridging 10x each.   ? ? ?Manual: ? ?Supine L shoulder stretches (all planes).  Full L shoulder capsular mobility with reports of L axillary pain at end-range flexion/ abduction.   ? ? ? ?01/05/22: ? ?There.ex.: ? ?Nustep L4 B UE/LE 10 min. (Discussed drop in blood sugar last night)- pt. States she is doing to [place] this weekend for [place].   ? ?BOSU static lunges on L/R  with light UE on //-bars 8x each ? ?Standing partial squats at //-bars (cuing for technique)- chair behind for safety. ? ?Resisted gait (lateral L/R)- 5x each with 2BTB. ? ?Nautilus: seated 40# lat. Pull down 25x/ 30# tricep extension 25x/ 30# shoulder extension- 15x2.   ? ?Supine L shoulder 3# serratus punches/ 3# bicep curls 20x.   ? ?Manual tx.: ? ?Supine L shoulder AA/PROM all planes (full ROM with discomfort at end-range flexion). ? ?Seated STM to L UT/cervical paraspinals/ scapular musculature (hypervolt).   ? ? ? ?01/03/22: ? ?There.ex.: ? ?Nustep L4-2 B LE/ R UE (no L UE secondary to shoulder discomfort today).  10 min at a consistent cadence. ? ?Reassessment of B shoulder AROM in standing.   ? ?Supine wand (3#) ex.: chest press/ shoulder flexion with elbow extended (pain limited)/ tricep extension 20x each (modified as tolerated).   ? ?Supine L serratus punches 10x. ? ?Manual tx.: ? ?Supine L shoulder AA/PROM all planes in pain tolerable range. ? ?Prone STM to B UT/scapular region/ lumbar paraspinals (use of Hypervolt).  Pt. More sensitive with manual STM, as compared to Hypervolt).   ? ? ? ?PATIENT EDUCATION: ?Education details: Form/technique with exercise ?Person educated: Patient ?Education method: Explanation and Demonstration ?Education comprehension: verbalized understanding and returned demonstration ? ? ?HOME EXERCISE PROGRAM: ?[id number] ? ? ? ? PT Long Term Goals -  ? ?  ? PT LONG TERM GOAL #1  ? Title Pt will increase FOTO score to 56 to improve overall perceived functional ability.   ? Baseline IE: 27.  1/3: 27 (no change)- pain limited.  3/2: 87 (ankle/foot)- 20th visit.   ? Time 8   ? Period Weeks   ? Status Partially Met   ? Target Date 01/12/22   ?  ? PT LONG TERM GOAL #2  ? Title Pt will increase ankle strength by at least 1/2 MMT grade in order to demonstrate improvement in strength and functio   ? Baseline L ankle strength grossly 3+/5 MMT (pain limited).  R ankle strength 5/5 MMT.  L ankle  remain pain limited.   ? Time 8   ? Period Weeks   ? Status Partially Met   ? Target Date 01/12/22   ?  ? PT LONG TERM GOAL #3  ? Title Pt will increase L ankle AROM to [facility] as compared to R ankle to improve functional mobility.   ? Baseline L/R ankle AROM: DF (-5/11 deg.), PF (44/58 deg.), IV (12/30 deg.), EV (15/15 deg.).   ? Time 8   ? Period Weeks   ? Status Partially Met   ? Target Date 01/12/22   ?  ? PT LONG TERM GOAL #4  ?  Title Pt. will ambulate with normalized gait pattern and consistent heel strike with no increase c/o L ankle pain without use of ankle support/brace.   ? Baseline L antalgic gait pattern.  7/10 L ankle pain.   ? Time 8   ? Period Weeks   ? Status Partially Met   ? Target Date 01/12/22   ?  ? PT LONG TERM GOAL #5  ? Title Pt. will increase L shoulder AROM to [facility] as compared to R shoulder to improve pain-free mobility/ ADL.   ? Baseline Supine L shoulder AROM: flexion 68 deg. (pain/guarded), abduction 66 deg. (pain), ER 32 deg., IR 85 deg.  Pt. unable to tolerate PROM to L shoulder at this time (>8/10 pain reported).  No MMT.   ? Time 8   ? Period Weeks   ? Status Not Met   ? Target Date 01/12/22   ?  ? PT LONG TERM GOAL #6  ? Title Pt. will increase FOTO to 58 to improve shoulder functional pain-free mobility.   ? Baseline Initial FOTO for shoulder: 43.  2/7: 54 (for shoulder on 5th visit).  3/2: 47 (10th visit in [place]).   ? Time 8   ? Period Weeks   ? Status Partially Met   ? Target Date 01/12/22   ? ?  ?  ? ?  ? ? ? Plan - 12/03/21 1112   ? ? Clinical Impression Statement L shoulder AROM has continued to present with full AROM and excellent capsular mobility.  Pt. Reports L axillary pain with full L shoulder flexion/ abduction in supine.  Good cervical/ L ankle AROM noted during tx. Session.  Pt. Remains limited by chronic generalized pain.  Pt will continue to benefit from skilled PT services to progress pain free mobility and strength.    ? Personal Factors and Comorbidities Comorbidity  3+   ? Examination-Activity Limitations Bathing;Bed Mobility;Bend;Carry;Dressing;Hygiene/Grooming;Lift;Locomotion Level;Sit;Squat;Stairs;Toileting;Stand   ? Examination-Participation Restrictions Cleaning;La

## 2022-01-12 ENCOUNTER — Ambulatory Visit: Payer: Medicare Other | Admitting: Physical Therapy

## 2022-01-12 DIAGNOSIS — M5441 Lumbago with sciatica, right side: Secondary | ICD-10-CM

## 2022-01-12 DIAGNOSIS — M25612 Stiffness of left shoulder, not elsewhere classified: Secondary | ICD-10-CM

## 2022-01-12 DIAGNOSIS — M25572 Pain in left ankle and joints of left foot: Secondary | ICD-10-CM | POA: Diagnosis not present

## 2022-01-12 DIAGNOSIS — M2560 Stiffness of unspecified joint, not elsewhere classified: Secondary | ICD-10-CM

## 2022-01-12 DIAGNOSIS — M25672 Stiffness of left ankle, not elsewhere classified: Secondary | ICD-10-CM

## 2022-01-12 DIAGNOSIS — M6281 Muscle weakness (generalized): Secondary | ICD-10-CM

## 2022-01-14 NOTE — Therapy (Signed)
?OUTPATIENT PHYSICAL THERAPY TREATMENT NOTE ? ? ?Patient Name: Kelsey Bell ?MRN: [id number] ?DOB:[birth year], 41 y.o., female ?Today's Date: 01/14/2022 ? ?PCP: Ricardo Jericho, NP ?REFERRING PROVIDER: Anderson Malta, MD ? ? ? PT End of Session - 01/14/22 1321   ? ? Visit Number 34   ? Number of Visits 39   ? Date for PT Re-Evaluation 01/12/22   ? Authorization Type 10 of 10 CAID authorization (3/27 to 5/12)   ? Authorization - Visit Number 4   ? Authorization - Number of Visits 10   ? PT Start Time 1023   ? PT Stop Time 1119   ? PT Time Calculation (min) 56 min   ? Activity Tolerance Patient limited by pain;Patient tolerated treatment well   ? Behavior During Therapy [facility] for tasks assessed/performed   ? ?  ?  ? ?  ? ? ? ?Past Medical History:  ?Diagnosis Date  ? Allergy   ? Anxiety   ? Arthritis   ? Asthma   ? Breast pain present for several months  ? Bil LT >RT across of breasts  ? CRPS (complex regional pain syndrome type I)   ? Depression   ? DVT (deep venous thrombosis) ([place]) 06/2018  ? Fibromyalgia   ? Kidney stones   ? LGSIL on Pap smear of cervix 2007  ? Migraine   ? Neuromuscular disorder ([place])   ? Complex regional pain syndrome and Fibromyalgia  ? Pancreatic pseudocyst   ? Pancreatitis   ? ?Past Surgical History:  ?Procedure Laterality Date  ? COLPOSCOPY  2017  ? neg bx  ? ERCP    ? EXTRACORPOREAL SHOCK WAVE LITHOTRIPSY Right 04/26/2017  ? Procedure: EXTRACORPOREAL SHOCK WAVE LITHOTRIPSY (ESWL);  Surgeon: Hollice Espy, MD;  Location: [facility] ORS;  Service: Urology;  Laterality: Right;  ? GASTROSTOMY-JEJEUNOSTOMY TUBE CHANGE/PLACEMENT    ? KNEE ARTHROSCOPY Right   ? nj placement    ? TONSILLECTOMY    ? UPPER ESOPHAGEAL ENDOSCOPIC ULTRASOUND (EUS)    ? UPPER GASTROINTESTINAL ENDOSCOPY    ? ?Patient Active Problem List  ? Diagnosis Date Noted  ? Orthostatic syncope   ? Dehydration 06/09/2020  ? Genetic testing 10/24/2018  ? Idiopathic acute pancreatitis 07/22/2018  ? DVT (deep venous thrombosis)  ([place]) 06/04/2018  ? Acute pancreatitis 05/27/2018  ? Right knee pain 05/19/2018  ? Peripheral edema 03/05/2018  ? Neuropathic pain 11/02/2017  ? Kidney stone 04/25/2017  ? Ureteral stone   ? Migraines 12/15/2016  ? Morbid obesity with BMI of 45.0-49.9, adult ([place]) 10/20/2015  ? Asthma, moderate 05/21/2015  ? Polyarthralgia 12/01/2011  ? Fibromyalgia 09/18/2011  ? Low back pain 09/18/2011  ? ? ?REFERRING DIAG: Sprain of L ankle/ L ankle pain/ L shoulder pain/ Low back pain ? ?THERAPY DIAG:  ?Pain in left ankle and joints of left foot ? ?Shoulder joint stiffness, left ? ?Acute bilateral low back pain with bilateral sciatica ? ?Ankle joint stiffness, left ? ?Muscle weakness (generalized) ? ?Joint stiffness ? ?PERTINENT HISTORY: see evaluation ? ?PRECAUTIONS: N/A ? ?SUBJECTIVE:  Pt. Had f/u visit with Dr. Saverio Danker who is happy with pts. L shoulder ROM/ progress. Pt. Is planning to rejoin [employer] after PT today.  Pt. States her abdomen is hurting more today and L shoulder is "spicy".   Pt. Took a pain pill.   ? ?PAIN:  ?Are you having pain? Yes  ?L shoulder/abdomen 6.5/10 NPS.  No pain score given for L ankle today.   ? ? ?  TODAY'S TREATMENT: ?01/14/22: ? ?There.ex.: ? ?Nustep L4-3 B LE/UE 10 min at a consistent cadence.  Discussed MD f/u visit and plans to rejoin PF/ discussed decreasing frequency with PT to 1x/week with gym program focus.   ? ?L LE step ups at stairs/ lunges at 2nd step and gastroc stretches at 1st step (as tolerated)- static holds.   ? ?Supine L shoulder rhythmic stabs (all planes)- min. To mod. Resistance (pain reported) ? ?Supine L shoulder A/AROM all planes ? ?Standing Nautilus: lat. Pull downs 40#/ tricep extension 40#/ scap. Retraction 40# 20x each.  ? ?Manual tx.: ? ?Supine L shoulder AA/PROM all planes in pain tolerable range. ? ?Prone STM to B UT/scapular region/ lumbar paraspinals (use of Hypervolt).  Pt. More sensitive with manual STM, as compared to Hypervolt).   ? ? ? ?01/09/22: ? ?Nustep  L4-3 10 min. B UE/LE (consistent cadence/ discussed daily activities).  MH to low back.  ? ?TRX squats at chair 10x.  Full B elbow extension during squats. ? ?Tandem gait (challenged with occasional use of //-bars to maintain balance)- forward/backwards.  Lateral walking with added braiding.  Slight increase in L ankle discomfort during braiding to L.   ? ?Supine L shoulder AROM (all planes). ? ?Supine TrA ex.: marching/ bicycles/ bridging 10x each.   ? ? ?Manual: ? ?Supine L shoulder stretches (all planes).  Full L shoulder capsular mobility with reports of L axillary pain at end-range flexion/ abduction.   ? ? ? ?01/05/22: ? ?There.ex.: ? ?Nustep L4 B UE/LE 10 min. (Discussed drop in blood sugar last night)- pt. States she is doing to [place] this weekend for [place].   ? ?BOSU static lunges on L/R with light UE on //-bars 8x each ? ?Standing partial squats at //-bars (cuing for technique)- chair behind for safety. ? ?Resisted gait (lateral L/R)- 5x each with 2BTB. ? ?Nautilus: seated 40# lat. Pull down 25x/ 30# tricep extension 25x/ 30# shoulder extension- 15x2.   ? ?Supine L shoulder 3# serratus punches/ 3# bicep curls 20x.   ? ?Manual tx.: ? ?Supine L shoulder AA/PROM all planes (full ROM with discomfort at end-range flexion). ? ?Seated STM to L UT/cervical paraspinals/ scapular musculature (hypervolt).   ? ? ? ? ? ?PATIENT EDUCATION: ?Education details: Form/technique with exercise ?Person educated: Patient ?Education method: Explanation and Demonstration ?Education comprehension: verbalized understanding and returned demonstration ? ? ?HOME EXERCISE PROGRAM: ?[id number] ? ? ? ? PT Long Term Goals -  ? ?  ? PT LONG TERM GOAL #1  ? Title Pt will increase FOTO score to 56 to improve overall perceived functional ability.   ? Baseline IE: 27.  1/3: 27 (no change)- pain limited.  3/2: 8 (ankle/foot)- 20th visit.   ? Time 8   ? Period Weeks   ? Status Partially Met   ? Target Date 01/12/22   ?  ? PT LONG  TERM GOAL #2  ? Title Pt will increase ankle strength by at least 1/2 MMT grade in order to demonstrate improvement in strength and functio   ? Baseline L ankle strength grossly 3+/5 MMT (pain limited).  R ankle strength 5/5 MMT.  L ankle remain pain limited.   ? Time 8   ? Period Weeks   ? Status Partially Met   ? Target Date 01/12/22   ?  ? PT LONG TERM GOAL #3  ? Title Pt will increase L ankle AROM to [facility] as compared to R ankle to improve functional mobility.   ?  Baseline L/R ankle AROM: DF (-5/11 deg.), PF (44/58 deg.), IV (12/30 deg.), EV (15/15 deg.).   ? Time 8   ? Period Weeks   ? Status Partially Met   ? Target Date 01/12/22   ?  ? PT LONG TERM GOAL #4  ? Title Pt. will ambulate with normalized gait pattern and consistent heel strike with no increase c/o L ankle pain without use of ankle support/brace.   ? Baseline L antalgic gait pattern.  7/10 L ankle pain.   ? Time 8   ? Period Weeks   ? Status Partially Met   ? Target Date 01/12/22   ?  ? PT LONG TERM GOAL #5  ? Title Pt. will increase L shoulder AROM to [facility] as compared to R shoulder to improve pain-free mobility/ ADL.   ? Baseline Supine L shoulder AROM: flexion 68 deg. (pain/guarded), abduction 66 deg. (pain), ER 32 deg., IR 85 deg.  Pt. unable to tolerate PROM to L shoulder at this time (>8/10 pain reported).  No MMT.   ? Time 8   ? Period Weeks   ? Status Not Met   ? Target Date 01/12/22   ?  ? PT LONG TERM GOAL #6  ? Title Pt. will increase FOTO to 58 to improve shoulder functional pain-free mobility.   ? Baseline Initial FOTO for shoulder: 43.  2/7: 54 (for shoulder on 5th visit).  3/2: 6 (10th visit in [place]).   ? Time 8   ? Period Weeks   ? Status Partially Met   ? Target Date 01/12/22   ? ?  ?  ? ?  ? ? ? Plan - 12/03/21 1112   ? ? Clinical Impression Statement PT will reassess all goals/ recert pt. Next tx. Session.  Pt. Works hard during tx. Session to increase L shoulder/ ankle stability in pain tolerable range.  Pt. Remains limited by  chronic generalized pain.  Pt will continue to benefit from skilled PT services to progress pain free mobility and strength.    ? Personal Factors and Comorbidities Comorbidity 3+   ? Examination-Activity Limita

## 2022-01-17 ENCOUNTER — Encounter: Payer: Medicare Other | Admitting: Physical Therapy

## 2022-01-19 ENCOUNTER — Ambulatory Visit: Payer: Medicare Other | Admitting: Physical Therapy

## 2022-01-24 ENCOUNTER — Ambulatory Visit: Payer: Medicare Other | Admitting: Physical Therapy

## 2022-01-26 ENCOUNTER — Ambulatory Visit: Payer: Medicare Other | Admitting: Physical Therapy

## 2022-01-26 ENCOUNTER — Encounter: Payer: Self-pay | Admitting: Physical Therapy

## 2022-01-26 DIAGNOSIS — M2560 Stiffness of unspecified joint, not elsewhere classified: Secondary | ICD-10-CM

## 2022-01-26 DIAGNOSIS — M25672 Stiffness of left ankle, not elsewhere classified: Secondary | ICD-10-CM

## 2022-01-26 DIAGNOSIS — M25572 Pain in left ankle and joints of left foot: Secondary | ICD-10-CM | POA: Diagnosis not present

## 2022-01-26 DIAGNOSIS — M5441 Lumbago with sciatica, right side: Secondary | ICD-10-CM

## 2022-01-26 DIAGNOSIS — M6281 Muscle weakness (generalized): Secondary | ICD-10-CM

## 2022-01-26 DIAGNOSIS — M25612 Stiffness of left shoulder, not elsewhere classified: Secondary | ICD-10-CM

## 2022-01-26 NOTE — Therapy (Signed)
OUTPATIENT PHYSICAL THERAPY TREATMENT NOTE   Patient Name: Kelsey Bell MRN: [id number] DOB:1981/05/05, 41 y.o., female Today's Date: 01/26/2022  PCP: Ricardo Jericho, NP REFERRING PROVIDER: Anderson Malta, MD    PT End of Session - 01/26/22 1238     Visit Number 35    Authorization Type 10 of 10 CAID authorization (3/27 to 5/12)    Authorization - Visit Number 5    Authorization - Number of Visits 10    PT Start Time 0109 to 1340  (61 minutes)   Activity Tolerance Patient limited by pain;Patient tolerated treatment well    Behavior During Therapy [facility] for tasks assessed/performed              Past Medical History:  Diagnosis Date   Allergy    Anxiety    Arthritis    Asthma    Breast pain present for several months   Bil LT >RT across of breasts   CRPS (complex regional pain syndrome type I)    Depression    DVT (deep venous thrombosis) ([place]) 06/2018   Fibromyalgia    Kidney stones    LGSIL on Pap smear of cervix 2007   Migraine    Neuromuscular disorder ([place])    Complex regional pain syndrome and Fibromyalgia   Pancreatic pseudocyst    Pancreatitis    Past Surgical History:  Procedure Laterality Date   COLPOSCOPY  2017   neg bx   ERCP     EXTRACORPOREAL SHOCK WAVE LITHOTRIPSY Right 04/26/2017   Procedure: EXTRACORPOREAL SHOCK WAVE LITHOTRIPSY (ESWL);  Surgeon: Hollice Espy, MD;  Location: [facility] ORS;  Service: Urology;  Laterality: Right;   GASTROSTOMY-JEJEUNOSTOMY TUBE CHANGE/PLACEMENT     KNEE ARTHROSCOPY Right    nj placement     TONSILLECTOMY     UPPER ESOPHAGEAL ENDOSCOPIC ULTRASOUND (EUS)     UPPER GASTROINTESTINAL ENDOSCOPY     Patient Active Problem List   Diagnosis Date Noted   Orthostatic syncope    Dehydration 06/09/2020   Genetic testing 10/24/2018   Idiopathic acute pancreatitis 07/22/2018   DVT (deep venous thrombosis) ([place]) 06/04/2018   Acute pancreatitis 05/27/2018   Right knee pain 05/19/2018   Peripheral edema  03/05/2018   Neuropathic pain 11/02/2017   Kidney stone 04/25/2017   Ureteral stone    Migraines 12/15/2016   Morbid obesity with BMI of 45.0-49.9, adult ([place]) 10/20/2015   Asthma, moderate 05/21/2015   Polyarthralgia 12/01/2011   Fibromyalgia 09/18/2011   Low back pain 09/18/2011    REFERRING DIAG: Sprain of L ankle/ L ankle pain/ L shoulder pain/ Low back pain  THERAPY DIAG:  Pain in left ankle and joints of left foot  Shoulder joint stiffness, left  Acute bilateral low back pain with bilateral sciatica  Ankle joint stiffness, left  Muscle weakness (generalized)  Joint stiffness  PERTINENT HISTORY: see evaluation  PRECAUTIONS: N/A  SUBJECTIVE:  Pt. Has cancelled last several PT visits secondary to sinus infection.  Pt. Had a blood sugar crash this morning and had to eat a bunch of food to bring glucose levels up.  Pt. "Feels like poo".    PAIN:  Are you having pain? Yes  L shoulder/abdomen:  no subjective pain score given.       TODAY'S TREATMENT:  01/26/22:  Nustep L4 10 min. B UE/LE (consistent cadence/ discussed daily activities).    Standing Nautilus: lat. Pull downs 40#/ tricep extension 30#/ chest press 30#/ scap. Retraction 40# 20x each.  Moderate fatigue  noted.  Discomfort in L shoulder.   Standing bodyblade:  B shoulder flexion/ difficulty with shoulder IR/ER.  Challenged with shoulder IR/ER and moderate fatigue with B shoulder flexion.    RTB B shoulder ER/ horizontal abduction 20x.    Tandem gait (challenged with occasional use of //-bars to maintain balance)- forward/backwards.  Lateral walking with added braiding.  Slight increase in L ankle discomfort during braiding to L.    Manual:  Supine L shoulder stretches (all planes).  Full L shoulder capsular mobility with reports of L axillary pain at end-range flexion/ abduction.    Seated STM/ use of hypervolt to B UT/ mid-thoracic/lumbar paraspinals.       01/14/22:  There.ex.:  Nustep L4-3 B  LE/UE 10 min at a consistent cadence.  Discussed MD f/u visit and plans to rejoin PF/ discussed decreasing frequency with PT to 1x/week with gym program focus.    L LE step ups at stairs/ lunges at 2nd step and gastroc stretches at 1st step (as tolerated)- static holds.    Supine L shoulder rhythmic stabs (all planes)- min. To mod. Resistance (pain reported)  Supine L shoulder A/AROM all planes  Standing Nautilus: lat. Pull downs 40#/ tricep extension 40#/ scap. Retraction 40# 20x each.   Manual tx.:  Supine L shoulder AA/PROM all planes in pain tolerable range.  Prone STM to B UT/scapular region/ lumbar paraspinals (use of Hypervolt).  Pt. More sensitive with manual STM, as compared to Hypervolt).         PATIENT EDUCATION: Education details: Form/technique with exercise Person educated: Patient Education method: Customer service manager Education comprehension: verbalized understanding and returned demonstration   HOME EXERCISE PROGRAM: [place] #1   Title Pt will increase FOTO score to 56 to improve overall perceived functional ability.    Baseline IE: 27.  1/3: 27 (no change)- pain limited.  3/2: 82 (ankle/foot)- 20th visit.  5/25: unable to reassess   Time 8    Period Weeks    Status Partially Met    Target Date 02/23/22      PT LONG TERM GOAL #2   Title Pt will increase ankle strength by at least 1/2 MMT grade in order to demonstrate improvement in strength and functio    Baseline L ankle strength grossly 3+/5 MMT (pain limited).  R ankle strength 5/5 MMT.  L ankle remain pain limited.    Time 8    Period Weeks    Status Partially Met    Target Date 02/23/22      PT LONG TERM GOAL #3   Title Pt will increase L ankle AROM to [facility] as compared to R ankle to improve functional mobility.    Baseline L/R ankle AROM: DF (-5/11 deg.), PF (44/58 deg.), IV (12/30 deg.), EV (15/15 deg.).    Time 8    Period Weeks     Status Partially Met    Target Date 02/23/22      PT LONG TERM GOAL #4   Title Pt. will ambulate with normalized gait pattern and consistent heel strike with no increase c/o L ankle pain without use of ankle support/brace.    Baseline L antalgic gait pattern.  7/10 L ankle pain.  01/26/22: marked improvement in ankle pain but occasional flare ups   Time 8    Period Weeks    Status Partially Met    Target Date  02/23/22      PT LONG TERM GOAL #5   Title Pt. will increase L shoulder AROM to [facility] as compared to R shoulder to improve pain-free mobility/ ADL.    Baseline Supine L shoulder AROM: flexion 68 deg. (pain/guarded), abduction 66 deg. (pain), ER 32 deg., IR 85 deg.  Pt. unable to tolerate PROM to L shoulder at this time (>8/10 pain reported).  No MMT.   5/25: L shoulder AROM WFL all planes.  Axillary discomfort with end-range sh. Flexion/ abduction.     Time 8    Period Weeks    Status Goal met   Target Date 01/26/22      PT LONG TERM GOAL #6   Title Pt. will increase FOTO to 58 to improve shoulder functional pain-free mobility.    Baseline Initial FOTO for shoulder: 43.  2/7: 54 (for shoulder on 5th visit).  3/2: 74 (10th visit in [place]).  5/25: Unable to reassess   Time 8    Period Weeks    Status Partially Met    Target Date 02/23/22              Plan -     Clinical Impression Statement Pt. Had a difficulty past 2 weeks due to illness but entered PT motivated to return to there.ex./ ankle stability ex.  Pt. Works hard during tx. Session to increase L shoulder/ ankle stability in pain tolerable range.  Pt. Remains limited by chronic generalized pain.  See updated goals.   Pt will continue to benefit from skilled PT services to progress pain free mobility and strength.     Personal Factors and Comorbidities Comorbidity 3+    Examination-Activity Limitations Bathing;Bed Mobility;Bend;Carry;Dressing;Hygiene/Grooming;Lift;Locomotion Level;Sit;Squat;Stairs;Toileting;Stand     Examination-Participation Restrictions Cleaning;Laundry;Meal Prep;Community Activity    Stability/Clinical Decision Making Evolving/Moderate complexity    Clinical Decision Making Moderate    Rehab Potential Fair    PT Frequency 2x / week    PT Duration 4 weeks    PT Treatment/Interventions ADLs/Self Care Home Management;Aquatic Therapy;Cryotherapy;Iontophoresis 23m/ml Dexamethasone;Patient/family education;Neuromuscular re-education;Balance training;Therapeutic exercise;Therapeutic activities;Functional mobility training;Stair training;Gait training;Manual techniques;Passive range of motion;Dry needling;Electrical Stimulation    PT Next Visit Plan CHECK FOTO for ankle and shoulder.    PT Home Exercise Plan [id number]    [id number] Agree with Plan of Care Patient            MPura Spice PT, DPT # [phone] Therapist- [facility] 01/26/2022, 12:39 PM

## 2022-02-02 ENCOUNTER — Encounter: Payer: Medicare Other | Admitting: Physical Therapy

## 2022-02-06 ENCOUNTER — Ambulatory Visit: Payer: Medicare Other | Attending: Orthopedic Surgery | Admitting: Physical Therapy

## 2022-02-06 ENCOUNTER — Encounter: Payer: Self-pay | Admitting: Physical Therapy

## 2022-02-06 DIAGNOSIS — M5441 Lumbago with sciatica, right side: Secondary | ICD-10-CM | POA: Diagnosis present

## 2022-02-06 DIAGNOSIS — M2560 Stiffness of unspecified joint, not elsewhere classified: Secondary | ICD-10-CM | POA: Diagnosis present

## 2022-02-06 DIAGNOSIS — M6281 Muscle weakness (generalized): Secondary | ICD-10-CM

## 2022-02-06 DIAGNOSIS — M25572 Pain in left ankle and joints of left foot: Secondary | ICD-10-CM | POA: Diagnosis present

## 2022-02-06 DIAGNOSIS — M25612 Stiffness of left shoulder, not elsewhere classified: Secondary | ICD-10-CM

## 2022-02-06 DIAGNOSIS — M25672 Stiffness of left ankle, not elsewhere classified: Secondary | ICD-10-CM | POA: Diagnosis present

## 2022-02-06 DIAGNOSIS — M5442 Lumbago with sciatica, left side: Secondary | ICD-10-CM | POA: Diagnosis present

## 2022-02-06 NOTE — Therapy (Signed)
OUTPATIENT PHYSICAL THERAPY TREATMENT NOTE   Patient Name: Kelsey Bell MRN: [id number] DOB:1981/04/03, 41 y.o., female Today's Date: 02/06/2022  PCP: Ricardo Jericho, NP REFERRING PROVIDER: [place], [place] of Session - 01/26/22 1238     Visit Number 36    Authorization Type [id number]   Authorization - Visit Number 6    Authorization - Number of Visits 10    PT Start Time 4193 to 1402  (56 minutes)   Activity Tolerance Patient limited by pain;Patient tolerated treatment well    Behavior During Therapy [facility] for tasks assessed/performed           Past Medical History:  Diagnosis Date   Allergy    Anxiety    Arthritis    Asthma    Breast pain present for several months   Bil LT >RT across of breasts   CRPS (complex regional pain syndrome type I)    Depression    DVT (deep venous thrombosis) ([place]) 06/2018   Fibromyalgia    Kidney stones    LGSIL on Pap smear of cervix 2007   Migraine    Neuromuscular disorder ([place])    Complex regional pain syndrome and Fibromyalgia   Pancreatic pseudocyst    Pancreatitis    Past Surgical History:  Procedure Laterality Date   COLPOSCOPY  2017   neg bx   ERCP     EXTRACORPOREAL SHOCK WAVE LITHOTRIPSY Right 04/26/2017   Procedure: EXTRACORPOREAL SHOCK WAVE LITHOTRIPSY (ESWL);  Surgeon: Hollice Espy, MD;  Location: [facility] ORS;  Service: Urology;  Laterality: Right;   GASTROSTOMY-JEJEUNOSTOMY TUBE CHANGE/PLACEMENT     KNEE ARTHROSCOPY Right    nj placement     TONSILLECTOMY     UPPER ESOPHAGEAL ENDOSCOPIC ULTRASOUND (EUS)     UPPER GASTROINTESTINAL ENDOSCOPY     Patient Active Problem List   Diagnosis Date Noted   Orthostatic syncope    Dehydration 06/09/2020   Genetic testing 10/24/2018   Idiopathic acute pancreatitis 07/22/2018   DVT (deep venous thrombosis) ([place]) 06/04/2018   Acute pancreatitis 05/27/2018   Right knee pain 05/19/2018   Peripheral edema 03/05/2018   Neuropathic pain 11/02/2017    Kidney stone 04/25/2017   Ureteral stone    Migraines 12/15/2016   Morbid obesity with BMI of 45.0-49.9, adult ([place]) 10/20/2015   Asthma, moderate 05/21/2015   Polyarthralgia 12/01/2011   Fibromyalgia 09/18/2011   Low back pain 09/18/2011    REFERRING DIAG: Sprain of L ankle/ L ankle pain/ L shoulder pain/ Low back pain  THERAPY DIAG:  Pain in left ankle and joints of left foot  Shoulder joint stiffness, left  Acute bilateral low back pain with bilateral sciatica  Ankle joint stiffness, left  Muscle weakness (generalized)  PERTINENT HISTORY: see evaluation  PRECAUTIONS: N/A  SUBJECTIVE:  Pt. Reports doing better since sinus infection and migraine last week.  Pt. States she is hurting in L shoulder.    PAIN:  Are you having pain? Yes  L shoulder/abdomen:  no subjective pain score given.       TODAY'S TREATMENT:  02/06/22:  Nustep L4 10 min. B UE/LE (consistent cadence/ discussed daily activities).    Recip. Stairs: 7x with light to no UE assist.    Standing Nautilus: lat. Pull downs 40#/ tricep extension 30#/ chest press 30#/ scap. Retraction 40#/ chest press 20# 20x each.  Moderate fatigue noted.  Discomfort in L shoulder.   Standing 2# chest press/punches 20x and shoulder flexion 20x with  mirror feedback (fatigue/ L shoulder pain reported).    Walking in PT clinic with consistent cadence/ gait pattern.   Manual:  MH to low back prior to manual tx.  Prone STM/ use of hypervolt to B UT/ mid-thoracic/lumbar paraspinals.    Reassessment of spinal mobility.      PATIENT EDUCATION: Education details: Form/technique with exercise Person educated: Patient Education method: Customer service manager Education comprehension: verbalized understanding and returned demonstration   HOME EXERCISE PROGRAM: [place] #1   Title Pt will increase FOTO score to 56 to improve overall perceived functional ability.     Baseline IE: 27.  1/3: 27 (no change)- pain limited.  3/2: 26 (ankle/foot)- 20th visit.  5/25: unable to reassess   Time 8    Period Weeks    Status Partially Met    Target Date 02/23/22      PT LONG TERM GOAL #2   Title Pt will increase ankle strength by at least 1/2 MMT grade in order to demonstrate improvement in strength and functio    Baseline L ankle strength grossly 3+/5 MMT (pain limited).  R ankle strength 5/5 MMT.  L ankle remain pain limited.    Time 8    Period Weeks    Status Partially Met    Target Date 02/23/22      PT LONG TERM GOAL #3   Title Pt will increase L ankle AROM to [facility] as compared to R ankle to improve functional mobility.    Baseline L/R ankle AROM: DF (-5/11 deg.), PF (44/58 deg.), IV (12/30 deg.), EV (15/15 deg.).    Time 8    Period Weeks    Status Partially Met    Target Date 02/23/22      PT LONG TERM GOAL #4   Title Pt. will ambulate with normalized gait pattern and consistent heel strike with no increase c/o L ankle pain without use of ankle support/brace.    Baseline L antalgic gait pattern.  7/10 L ankle pain.  01/26/22: marked improvement in ankle pain but occasional flare ups   Time 8    Period Weeks    Status Partially Met    Target Date 02/23/22      PT LONG TERM GOAL #5   Title Pt. will increase L shoulder AROM to [facility] as compared to R shoulder to improve pain-free mobility/ ADL.    Baseline Supine L shoulder AROM: flexion 68 deg. (pain/guarded), abduction 66 deg. (pain), ER 32 deg., IR 85 deg.  Pt. unable to tolerate PROM to L shoulder at this time (>8/10 pain reported).  No MMT.   5/25: L shoulder AROM WFL all planes.  Axillary discomfort with end-range sh. Flexion/ abduction.     Time 8    Period Weeks    Status Goal met   Target Date 01/26/22      PT LONG TERM GOAL #6   Title Pt. will increase FOTO to 58 to improve shoulder functional pain-free mobility.    Baseline Initial FOTO for shoulder: 43.  2/7: 54 (for shoulder on 5th  visit).  3/2: 26 (10th visit in [place]).  5/25: Unable to reassess   Time 8    Period Weeks    Status Partially Met    Target Date 02/23/22              Plan -     Clinical  Impression Statement Significant low back/ L mid-scapular tenderness with gentle STM and use of Hypervolt.  Good L shoulder AROM noted in standing posture and pt. Progressing well with resisted UE ex.  Pt. Works hard during tx. Session to increase L shoulder/ ankle stability in pain tolerable range.  Pt. Remains limited by chronic generalized pain.  Pt will continue to benefit from skilled PT services to progress pain free mobility and strength.     Personal Factors and Comorbidities Comorbidity 3+    Examination-Activity Limitations Bathing;Bed Mobility;Bend;Carry;Dressing;Hygiene/Grooming;Lift;Locomotion Level;Sit;Squat;Stairs;Toileting;Stand    Examination-Participation Restrictions Cleaning;Laundry;Meal Prep;Community Activity    Stability/Clinical Decision Making Evolving/Moderate complexity    Clinical Decision Making Moderate    Rehab Potential Fair    PT Frequency 2x / week    PT Duration 4 weeks    PT Treatment/Interventions ADLs/Self Care Home Management;Aquatic Therapy;Cryotherapy;Iontophoresis 43m/ml Dexamethasone;Patient/family education;Neuromuscular re-education;Balance training;Therapeutic exercise;Therapeutic activities;Functional mobility training;Stair training;Gait training;Manual techniques;Passive range of motion;Dry needling;Electrical Stimulation    PT Next Visit Plan CHECK FOTO for ankle and shoulder.    PT Home Exercise Plan [id number]    JSUNHRVACand Agree with Plan of Care Patient            MPura Spice PT, DPT # [phone] Therapist- [facility] 02/06/2022, 3:34 PM

## 2022-02-08 ENCOUNTER — Encounter: Payer: Medicare Other | Admitting: Physical Therapy

## 2022-02-09 ENCOUNTER — Ambulatory Visit: Payer: Medicare Other | Admitting: Physical Therapy

## 2022-02-09 ENCOUNTER — Encounter: Payer: Self-pay | Admitting: Physical Therapy

## 2022-02-09 DIAGNOSIS — M25612 Stiffness of left shoulder, not elsewhere classified: Secondary | ICD-10-CM

## 2022-02-09 DIAGNOSIS — M2560 Stiffness of unspecified joint, not elsewhere classified: Secondary | ICD-10-CM

## 2022-02-09 DIAGNOSIS — M6281 Muscle weakness (generalized): Secondary | ICD-10-CM

## 2022-02-09 DIAGNOSIS — M25572 Pain in left ankle and joints of left foot: Secondary | ICD-10-CM | POA: Diagnosis not present

## 2022-02-09 DIAGNOSIS — M25672 Stiffness of left ankle, not elsewhere classified: Secondary | ICD-10-CM

## 2022-02-09 DIAGNOSIS — M5441 Lumbago with sciatica, right side: Secondary | ICD-10-CM
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
## 2022-02-13 ENCOUNTER — Ambulatory Visit: Payer: Medicare Other | Admitting: Physical Therapy

## 2022-02-14 ENCOUNTER — Ambulatory Visit: Payer: Medicare Other | Admitting: Physical Therapy

## 2022-02-14 DIAGNOSIS — M25572 Pain in left ankle and joints of left foot: Secondary | ICD-10-CM | POA: Diagnosis not present

## 2022-02-14 DIAGNOSIS — M5441 Lumbago with sciatica, right side: Secondary | ICD-10-CM

## 2022-02-14 DIAGNOSIS — M6281 Muscle weakness (generalized): Secondary | ICD-10-CM

## 2022-02-14 DIAGNOSIS — M25612 Stiffness of left shoulder, not elsewhere classified: Secondary | ICD-10-CM

## 2022-02-14 DIAGNOSIS — M2560 Stiffness of unspecified joint, not elsewhere classified: Secondary | ICD-10-CM

## 2022-02-14 DIAGNOSIS — M25672 Stiffness of left ankle, not elsewhere classified: Secondary | ICD-10-CM

## 2022-02-14 NOTE — Therapy (Cosign Needed)
OUTPATIENT PHYSICAL THERAPY TREATMENT NOTE   Patient Name: Kelsey Bell MRN: [id number] DOB:05-19-1981, 41 y.o., female Today's Date: 02/15/2022  PCP: Ricardo Jericho, NP REFERRING PROVIDER: Ricardo Jericho, NP    PT End of Session -     Visit Number 31    Authorization Type 42    Recert date: 2/68/34   Authorization - Visit Number 8   Authorization - Number of Visits 10    PT Start Time 11:26 - 12:01   (35 minutes)   Activity Tolerance Patient limited by pain;Patient tolerated treatment well    Behavior During Therapy [facility] for tasks assessed/performed           Past Medical History:  Diagnosis Date   Allergy    Anxiety    Arthritis    Asthma    Breast pain present for several months   Bil LT >RT across of breasts   CRPS (complex regional pain syndrome type I)    Depression    DVT (deep venous thrombosis) ([place]) 06/2018   Fibromyalgia    Kidney stones    LGSIL on Pap smear of cervix 2007   Migraine    Neuromuscular disorder ([place])    Complex regional pain syndrome and Fibromyalgia   Pancreatic pseudocyst    Pancreatitis    Past Surgical History:  Procedure Laterality Date   COLPOSCOPY  2017   neg bx   ERCP     EXTRACORPOREAL SHOCK WAVE LITHOTRIPSY Right 04/26/2017   Procedure: EXTRACORPOREAL SHOCK WAVE LITHOTRIPSY (ESWL);  Surgeon: Hollice Espy, MD;  Location: [facility] ORS;  Service: Urology;  Laterality: Right;   GASTROSTOMY-JEJEUNOSTOMY TUBE CHANGE/PLACEMENT     KNEE ARTHROSCOPY Right    nj placement     TONSILLECTOMY     UPPER ESOPHAGEAL ENDOSCOPIC ULTRASOUND (EUS)     UPPER GASTROINTESTINAL ENDOSCOPY     Patient Active Problem List   Diagnosis Date Noted   Orthostatic syncope    Dehydration 06/09/2020   Genetic testing 10/24/2018   Idiopathic acute pancreatitis 07/22/2018   DVT (deep venous thrombosis) ([place]) 06/04/2018   Acute pancreatitis 05/27/2018   Right knee pain 05/19/2018   Peripheral edema 03/05/2018   Neuropathic pain  11/02/2017   Kidney stone 04/25/2017   Ureteral stone    Migraines 12/15/2016   Morbid obesity with BMI of 45.0-49.9, adult ([place]) 10/20/2015   Asthma, moderate 05/21/2015   Polyarthralgia 12/01/2011   Fibromyalgia 09/18/2011   Low back pain 09/18/2011    REFERRING DIAG: Sprain of L ankle/ L ankle pain/ L shoulder pain/ Low back pain  THERAPY DIAG:  Pain in left ankle and joints of left foot  Shoulder joint stiffness, left  Acute bilateral low back pain with bilateral sciatica  Ankle joint stiffness, left  Muscle weakness (generalized)  Joint stiffness  PERTINENT HISTORY: see evaluation  PRECAUTIONS: N/A  SUBJECTIVE:  Pt states an increase in L suboccipital pain, with palpable mass below L occiput. Pt. C/o minor L UE radicular symptoms today, with 4/10 pain throughout body.   PAIN:  Are you having pain? Yes  L shoulder:  4/10      TODAY'S TREATMENT:   02/14/22:  There ex: Supine AAROM with wooden pole into shoulder flexion and scaption - x 10    Open book in supine   Standing doorway stretch where pt places arm over head onto doorframe and sidebends away from doorframe in order to stretch her lats and intercostals. - 30 s hold B  Manual Therapy: Cervical  distraction  - 30 s x 2 Suboccipital release - 30 s x 2 Supine trunk rotation with overpressure at end range - x 4 (20 s hold) Manual upper trap stretch in supine position - 20 s x 4 B UE long distraction and grade II inferior joint mobilization at humeral head in supine position    02/09/22:  Nustep L4 10 min B UE/LE (consistent cadence/ discussed daily activities)  There ex:  Tandem stance on airex pad - B 30 s holds   Forward lunge onto BOSU ball - B x 10   Lateral lunge onto BOSU ball - 10 x on R LE, unable to perform on L LE due to pain  LAQ on blue ball 10 x - 2 sets B  LAQ on blue ball with contralateral forward punch with 3# weight B 10 x 2 sets  Marching on blue ball with contralateral  forward punch 3# weight B 10 x 2 sets  Scaption on blue ball with 3# weight with TA activation - 10 x 2 sets  Shoulder rows with red theraband on blue ball 10x  AAROM shoulder flexion with pipe - 10 x 2 sets  AAROM shoulder extension with pipe - limited by pain     PATIENT EDUCATION: Education details: Form/technique with exercise Person educated: Patient Education method: Customer service manager Education comprehension: verbalized understanding and returned demonstration   HOME EXERCISE PROGRAM: [email or web address]     PT Long Term Goals -      PT LONG TERM GOAL #1   Title Pt will increase FOTO score to 56 to improve overall perceived functional ability.    Baseline IE: 27.  1/3: 27 (no change)- pain limited.  3/2: 18 (ankle/foot)- 20th visit.  5/25: unable to reassess   Time 8    Period Weeks    Status Partially Met    Target Date 02/23/22      PT LONG TERM GOAL #2   Title Pt will increase ankle strength by at least 1/2 MMT grade in order to demonstrate improvement in strength and functio    Baseline L ankle strength grossly 3+/5 MMT (pain limited).  R ankle strength 5/5 MMT.  L ankle remain pain limited.    Time 8    Period Weeks    Status Partially Met    Target Date 02/23/22      PT LONG TERM GOAL #3   Title Pt will increase L ankle AROM to [facility] as compared to R ankle to improve functional mobility.    Baseline L/R ankle AROM: DF (-5/11 deg.), PF (44/58 deg.), IV (12/30 deg.), EV (15/15 deg.).    Time 8    Period Weeks    Status Partially Met    Target Date 02/23/22      PT LONG TERM GOAL #4   Title Pt. will ambulate with normalized gait pattern and consistent heel strike with no increase c/o L ankle pain without use of ankle support/brace.    Baseline L antalgic gait pattern.  7/10 L ankle pain.  01/26/22: marked improvement in ankle pain but occasional flare ups   Time 8    Period Weeks    Status Partially Met    Target Date 02/23/22      PT LONG TERM GOAL  #5   Title Pt. will increase L shoulder AROM to [facility] as compared to R shoulder to improve pain-free mobility/ ADL.    Baseline Supine L shoulder AROM: flexion 68 deg. (pain/guarded), abduction 66  deg. (pain), ER 32 deg., IR 85 deg.  Pt. unable to tolerate PROM to L shoulder at this time (>8/10 pain reported).  No MMT.   5/25: L shoulder AROM WFL all planes.  Axillary discomfort with end-range sh. Flexion/ abduction.     Time 8    Period Weeks    Status Goal met   Target Date 01/26/22      PT LONG TERM GOAL #6   Title Pt. will increase FOTO to 58 to improve shoulder functional pain-free mobility.    Baseline Initial FOTO for shoulder: 43.  2/7: 54 (for shoulder on 5th visit).  3/2: 52 (10th visit in [place]).  5/25: Unable to reassess   Time 8    Period Weeks    Status Partially Met    Target Date 02/23/22              Plan -     Clinical Impression Statement Pt had increased tenderness to L suboccipital region, and decreased pain in L UE from last visit. Pt stated that her pec musculature was "tight", and that she felt a "pull" in her chest upon end range flexion and scaption of her UE in supine position. Pt responded well to a doorway stretch, which aided the stretching of her lats and intercostal musculature.  Pt. Remains limited by chronic generalized pain.  Pt will continue to benefit from skilled PT services to progress pain free mobility and strength.     Personal Factors and Comorbidities Comorbidity 3+    Examination-Activity Limitations Bathing;Bed Mobility;Bend;Carry;Dressing;Hygiene/Grooming;Lift;Locomotion Level;Sit;Squat;Stairs;Toileting;Stand    Examination-Participation Restrictions Cleaning;Laundry;Meal Prep;Community Activity    Stability/Clinical Decision Making Evolving/Moderate complexity    Clinical Decision Making Moderate    Rehab Potential Fair    PT Frequency 2x / week    PT Duration 4 weeks    PT Treatment/Interventions ADLs/Self Care Home Management;Aquatic  Therapy;Cryotherapy;Iontophoresis 34m/ml Dexamethasone;Patient/family education;Neuromuscular re-education;Balance training;Therapeutic exercise;Therapeutic activities;Functional mobility training;Stair training;Gait training;Manual techniques;Passive range of motion;Dry needling;Electrical Stimulation    PT Next Visit Plan CHECK FOTO for ankle and shoulder.    PT Home Exercise Plan [email or web address]   Consulted and Agree with Plan of Care Patient            KAndee Lineman SPT MPura Spice PT, DPT # [phone] Therapist- [facility] 02/15/2022, 2:19 PM

## 2022-02-15 ENCOUNTER — Encounter: Payer: Medicare Other | Admitting: Physical Therapy

## 2022-02-16 ENCOUNTER — Ambulatory Visit: Payer: Medicare Other | Admitting: Physical Therapy

## 2022-02-16 DIAGNOSIS — M25572 Pain in left ankle and joints of left foot: Secondary | ICD-10-CM

## 2022-02-16 DIAGNOSIS — M5441 Lumbago with sciatica, right side: Secondary | ICD-10-CM

## 2022-02-16 DIAGNOSIS — M6281 Muscle weakness (generalized): Secondary | ICD-10-CM

## 2022-02-16 DIAGNOSIS — M25612 Stiffness of left shoulder, not elsewhere classified: Secondary | ICD-10-CM

## 2022-02-16 DIAGNOSIS — M25672 Stiffness of left ankle, not elsewhere classified: Secondary | ICD-10-CM

## 2022-02-16 DIAGNOSIS — M2560 Stiffness of unspecified joint, not elsewhere classified: Secondary | ICD-10-CM

## 2022-02-16 NOTE — Therapy (Addendum)
OUTPATIENT PHYSICAL THERAPY TREATMENT NOTE   Patient Name: Kelsey Bell MRN: [id number] DOB:[birth year], 41 y.o., female Today's Date: 02/16/2022  PCP: Ricardo Jericho, NP REFERRING PROVIDER: Ricardo Jericho, NP    PT End of Session -     Visit Number 64   Authorization Type 42    Recert date: 0/93/23   Authorization - Visit Number 9   Authorization - Number of Visits 10    PT Start Time 11:19 - 11:59   (40 minutes)   Activity Tolerance Patient limited by pain;Patient tolerated treatment well    Behavior During Therapy [facility] for tasks assessed/performed           Past Medical History:  Diagnosis Date   Allergy    Anxiety    Arthritis    Asthma    Breast pain present for several months   Bil LT >RT across of breasts   CRPS (complex regional pain syndrome type I)    Depression    DVT (deep venous thrombosis) ([place]) 06/2018   Fibromyalgia    Kidney stones    LGSIL on Pap smear of cervix 2007   Migraine    Neuromuscular disorder ([place])    Complex regional pain syndrome and Fibromyalgia   Pancreatic pseudocyst    Pancreatitis    Past Surgical History:  Procedure Laterality Date   COLPOSCOPY  2017   neg bx   ERCP     EXTRACORPOREAL SHOCK WAVE LITHOTRIPSY Right 04/26/2017   Procedure: EXTRACORPOREAL SHOCK WAVE LITHOTRIPSY (ESWL);  Surgeon: Hollice Espy, MD;  Location: [facility] ORS;  Service: Urology;  Laterality: Right;   GASTROSTOMY-JEJEUNOSTOMY TUBE CHANGE/PLACEMENT     KNEE ARTHROSCOPY Right    nj placement     TONSILLECTOMY     UPPER ESOPHAGEAL ENDOSCOPIC ULTRASOUND (EUS)     UPPER GASTROINTESTINAL ENDOSCOPY     Patient Active Problem List   Diagnosis Date Noted   Orthostatic syncope    Dehydration 06/09/2020   Genetic testing 10/24/2018   Idiopathic acute pancreatitis 07/22/2018   DVT (deep venous thrombosis) ([place]) 06/04/2018   Acute pancreatitis 05/27/2018   Right knee pain 05/19/2018   Peripheral edema 03/05/2018   Neuropathic pain  11/02/2017   Kidney stone 04/25/2017   Ureteral stone    Migraines 12/15/2016   Morbid obesity with BMI of 45.0-49.9, adult ([place]) 10/20/2015   Asthma, moderate 05/21/2015   Polyarthralgia 12/01/2011   Fibromyalgia 09/18/2011   Low back pain 09/18/2011    REFERRING DIAG: Sprain of L ankle/ L ankle pain/ L shoulder pain/ Low back pain  THERAPY DIAG:  Pain in left ankle and joints of left foot  Shoulder joint stiffness, left  Acute bilateral low back pain with bilateral sciatica  Ankle joint stiffness, left  Muscle weakness (generalized)  Joint stiffness  PERTINENT HISTORY: see evaluation  PRECAUTIONS: N/A  SUBJECTIVE:  Pt states an increase in L suboccipital pain, with palpable mass below L occiput that PCP diagnosed as a lymph node. Pt. C/o L UE radicular symptoms today, with pain throughout body. Pt had tenderness to touch on her L UE. Pt stated that her neck, L arm, L shoulder, R shoulder, upper traps, and B legs were hurting.   PAIN:  Are you having pain? Yes  L shoulder:  4/10      TODAY'S TREATMENT:  02/14/22:  There ex: AROM with wooden pole into shoulder flexion, scaption, horizontal abduction - x 10  Standing doorway stretch where pt places arm over head onto doorframe  and sidebends away from doorframe in order to stretch her lats and intercostals. - 30 s hold B  Manual Therapy: Lymphatic drainage techniques to L UE STM to fingers, forearm, and bicep from proximal to distal extremity PROM of UEs in supine position into flexion and scaption - 30 s holds at end range    02/14/22:  There ex: Supine AAROM with wooden pole into shoulder flexion and scaption - x 10    Open book in supine   Standing doorway stretch where pt places arm over head onto doorframe and sidebends away from doorframe in order to stretch her lats and intercostals. - 30 s hold B  Manual Therapy: Cervical distraction  - 30 s x 2 Suboccipital release - 30 s x 2 Supine trunk rotation  with overpressure at end range - x 4 (20 s hold) Manual upper trap stretch in supine position - 20 s x 4 B UE long distraction and grade II inferior joint mobilization at humeral head in supine position     PATIENT EDUCATION: Education details: Form/technique with exercise Person educated: Patient Education method: Customer service manager Education comprehension: verbalized understanding and returned demonstration   HOME EXERCISE PROGRAM: [email or web address]     PT Long Term Goals -      PT LONG TERM GOAL #1   Title Pt will increase FOTO score to 56 to improve overall perceived functional ability.    Baseline IE: 27.  1/3: 27 (no change)- pain limited.  3/2: 67 (ankle/foot)- 20th visit.  5/25: unable to reassess   Time 8    Period Weeks    Status Partially Met    Target Date 02/23/22      PT LONG TERM GOAL #2   Title Pt will increase ankle strength by at least 1/2 MMT grade in order to demonstrate improvement in strength and functio    Baseline L ankle strength grossly 3+/5 MMT (pain limited).  R ankle strength 5/5 MMT.  L ankle remain pain limited.    Time 8    Period Weeks    Status Partially Met    Target Date 02/23/22      PT LONG TERM GOAL #3   Title Pt will increase L ankle AROM to [facility] as compared to R ankle to improve functional mobility.    Baseline L/R ankle AROM: DF (-5/11 deg.), PF (44/58 deg.), IV (12/30 deg.), EV (15/15 deg.).    Time 8    Period Weeks    Status Partially Met    Target Date 02/23/22      PT LONG TERM GOAL #4   Title Pt. will ambulate with normalized gait pattern and consistent heel strike with no increase c/o L ankle pain without use of ankle support/brace.    Baseline L antalgic gait pattern.  7/10 L ankle pain.  01/26/22: marked improvement in ankle pain but occasional flare ups   Time 8    Period Weeks    Status Partially Met    Target Date 02/23/22      PT LONG TERM GOAL #5   Title Pt. will increase L shoulder AROM to [facility] as compared  to R shoulder to improve pain-free mobility/ ADL.    Baseline Supine L shoulder AROM: flexion 68 deg. (pain/guarded), abduction 66 deg. (pain), ER 32 deg., IR 85 deg.  Pt. unable to tolerate PROM to L shoulder at this time (>8/10 pain reported).  No MMT.   5/25: L shoulder AROM WFL all planes.  Axillary  discomfort with end-range sh. Flexion/ abduction.     Time 8    Period Weeks    Status Goal met   Target Date 01/26/22      PT LONG TERM GOAL #6   Title Pt. will increase FOTO to 58 to improve shoulder functional pain-free mobility.    Baseline Initial FOTO for shoulder: 43.  2/7: 54 (for shoulder on 5th visit).  3/2: 6 (10th visit in [place]).  5/25: Unable to reassess   Time 8    Period Weeks    Status Partially Met    Target Date 02/23/22              Plan -     Clinical Impression Statement Pt had increased tenderness to L suboccipital region, and decreased pain in L UE from last visit. Pt stated that her pec musculature was "tight", and that she felt a "pull" in her chest upon end range flexion and scaption of her UE in supine position. Pt responded well to a doorway stretch, which aided the stretching of her lats and intercostal musculature.  Pt. Remains limited by chronic generalized pain.  Pt will continue to benefit from skilled PT services to progress pain free mobility and strength.     Personal Factors and Comorbidities Comorbidity 3+    Examination-Activity Limitations Bathing;Bed Mobility;Bend;Carry;Dressing;Hygiene/Grooming;Lift;Locomotion Level;Sit;Squat;Stairs;Toileting;Stand    Examination-Participation Restrictions Cleaning;Laundry;Meal Prep;Community Activity    Stability/Clinical Decision Making Evolving/Moderate complexity    Clinical Decision Making Moderate    Rehab Potential Fair    PT Frequency 2x / week    PT Duration 4 weeks    PT Treatment/Interventions ADLs/Self Care Home Management;Aquatic Therapy;Cryotherapy;Iontophoresis 51m/ml  Dexamethasone;Patient/family education;Neuromuscular re-education;Balance training;Therapeutic exercise;Therapeutic activities;Functional mobility training;Stair training;Gait training;Manual techniques;Passive range of motion;Dry needling;Electrical Stimulation    PT Next Visit Plan 10th visit progress note next tx.  Update goals.     PT Home Exercise Plan [email or web address]   Consulted and Agree with Plan of Care Patient            KAndee Lineman SPT MPura Spice PT, DPT # [phone] Therapist- [facility] 02/16/2022, 12:49 PM

## 2022-02-20 ENCOUNTER — Ambulatory Visit: Payer: Medicare Other | Admitting: Physical Therapy

## 2022-02-20 DIAGNOSIS — M25572 Pain in left ankle and joints of left foot: Secondary | ICD-10-CM | POA: Diagnosis not present

## 2022-02-20 DIAGNOSIS — M25672 Stiffness of left ankle, not elsewhere classified: Secondary | ICD-10-CM

## 2022-02-20 DIAGNOSIS — M5441 Lumbago with sciatica, right side: Secondary | ICD-10-CM

## 2022-02-20 DIAGNOSIS — M2560 Stiffness of unspecified joint, not elsewhere classified: Secondary | ICD-10-CM

## 2022-02-20 DIAGNOSIS — M6281 Muscle weakness (generalized): Secondary | ICD-10-CM

## 2022-02-20 DIAGNOSIS — M25612 Stiffness of left shoulder, not elsewhere classified: Secondary | ICD-10-CM

## 2022-02-20 NOTE — Therapy (Incomplete Revision)
OUTPATIENT PHYSICAL THERAPY TREATMENT NOTE Physical Therapy Progress Note   Dates of reporting period  01/03/22  to  02/20/22   Patient Name: Kelsey Bell MRN: [id number] DOB:06-21-1981, 41 y.o., female Today's Date: 02/20/2022  PCP: Ricardo Jericho, NP REFERRING PROVIDER: Ricardo Jericho, NP    PT End of Session -     Visit Number 40   Authorization Type 42    Recert date: 1/47/82   Authorization - Visit Number 9   Authorization - Number of Visits 10    PT Start Time 11:31 - 12:05 (34 minutes)   Activity Tolerance Patient limited by pain;Patient tolerated treatment well    Behavior During Therapy [facility] for tasks assessed/performed           Past Medical History:  Diagnosis Date   Allergy    Anxiety    Arthritis    Asthma    Breast pain present for several months   Bil LT >RT across of breasts   CRPS (complex regional pain syndrome type I)    Depression    DVT (deep venous thrombosis) ([place]) 06/2018   Fibromyalgia    Kidney stones    LGSIL on Pap smear of cervix 2007   Migraine    Neuromuscular disorder ([place])    Complex regional pain syndrome and Fibromyalgia   Pancreatic pseudocyst    Pancreatitis    Past Surgical History:  Procedure Laterality Date   COLPOSCOPY  2017   neg bx   ERCP     EXTRACORPOREAL SHOCK WAVE LITHOTRIPSY Right 04/26/2017   Procedure: EXTRACORPOREAL SHOCK WAVE LITHOTRIPSY (ESWL);  Surgeon: Hollice Espy, MD;  Location: [facility] ORS;  Service: Urology;  Laterality: Right;   GASTROSTOMY-JEJEUNOSTOMY TUBE CHANGE/PLACEMENT     KNEE ARTHROSCOPY Right    nj placement     TONSILLECTOMY     UPPER ESOPHAGEAL ENDOSCOPIC ULTRASOUND (EUS)     UPPER GASTROINTESTINAL ENDOSCOPY     Patient Active Problem List   Diagnosis Date Noted   Orthostatic syncope    Dehydration 06/09/2020   Genetic testing 10/24/2018   Idiopathic acute pancreatitis 07/22/2018   DVT (deep venous thrombosis) ([place]) 06/04/2018   Acute pancreatitis 05/27/2018    Right knee pain 05/19/2018   Peripheral edema 03/05/2018   Neuropathic pain 11/02/2017   Kidney stone 04/25/2017   Ureteral stone    Migraines 12/15/2016   Morbid obesity with BMI of 45.0-49.9, adult ([place]) 10/20/2015   Asthma, moderate 05/21/2015   Polyarthralgia 12/01/2011   Fibromyalgia 09/18/2011   Low back pain 09/18/2011    REFERRING DIAG: Sprain of L ankle/ L ankle pain/ L shoulder pain/ Low back pain  THERAPY DIAG:  Pain in left ankle and joints of left foot  Muscle weakness (generalized)  Shoulder joint stiffness, left  Joint stiffness  Acute bilateral low back pain with bilateral sciatica  Ankle joint stiffness, left  PERTINENT HISTORY: see evaluation  PRECAUTIONS: N/A  SUBJECTIVE:  Pt stated that she has been feeling under the weather lately, and has not been able to do much of her HEP. Pt has swollen lymph node in L suboccipital region, and has been extra tired lately. Pt has pain in low back/flank region, which was tender to the touch. Pt's L shoulder has decreased pain, and increased ROM since last visit.   PAIN:  Are you having pain? Yes  L shoulder:  3/10      TODAY'S TREATMENT:  02/20/22: Manual Therapy: Prone: STM to thoracic, lumbar, and sacral paraspinals -  5 mins  Hypervolt to thoracic, lumbar, and sacral paraspinals - 5 mins - pain in sacral region  Grade 2 joint mobs to thoracic, lumbar, and sacral vertebrae - 15 s per vertebrae   There ex: Seated rows on blue ball with blue TB 10 x ; 3 sets  90 deg shoulder flexion punches with 2# weight on blue ball - 10 x 2 B Shoulder abduction (above head) with 2# on blue ball - 10 x 2 B Scaption with 2# weight on blue ball 10 x x 2 B AAROM with wooden pole into shoulder flexion and scaption   Pt education on importance of a gym membership to remain active daily.    02/14/22:  There ex: AROM with wooden pole into shoulder flexion, scaption, horizontal abduction - x 10  Standing doorway stretch  where pt places arm over head onto doorframe and sidebends away from doorframe in order to stretch her lats and intercostals. - 30 s hold B  Manual Therapy: Lymphatic drainage techniques to L UE STM to fingers, forearm, and bicep from proximal to distal extremity PROM of UEs in supine position into flexion and scaption - 30 s holds at end range      PATIENT EDUCATION: Education details: Form/technique with exercise Person educated: Patient Education method: Customer service manager Education comprehension: verbalized understanding and returned demonstration   HOME EXERCISE PROGRAM: [email or web address]     PT Long Term Goals -      PT LONG TERM GOAL #1   Title Pt will increase FOTO score to 56 to improve overall perceived functional ability.    Baseline IE: 27.  1/3: 27 (no change)- pain limited.  3/2: 83 (ankle/foot)- 20th visit.  5/25: unable to reassess   Time 8    Period Weeks    Status Partially Met    Target Date 02/23/22      PT LONG TERM GOAL #2   Title Pt will increase ankle strength by at least 1/2 MMT grade in order to demonstrate improvement in strength and functio    Baseline L ankle strength grossly 3+/5 MMT (pain limited).  R ankle strength 5/5 MMT.  L ankle remain pain limited.    Time 8    Period Weeks    Status Partially Met    Target Date 02/23/22      PT LONG TERM GOAL #3   Title Pt will increase L ankle AROM to [facility] as compared to R ankle to improve functional mobility.    Baseline L/R ankle AROM: DF (-5/11 deg.), PF (44/58 deg.), IV (12/30 deg.), EV (15/15 deg.).    Time 8    Period Weeks    Status Partially Met    Target Date 02/23/22      PT LONG TERM GOAL #4   Title Pt. will ambulate with normalized gait pattern and consistent heel strike with no increase c/o L ankle pain without use of ankle support/brace.    Baseline L antalgic gait pattern.  7/10 L ankle pain.  01/26/22: marked improvement in ankle pain but occasional flare ups   Time 8     Period Weeks    Status Partially Met    Target Date 02/23/22      PT LONG TERM GOAL #5   Title Pt. will increase L shoulder AROM to [facility] as compared to R shoulder to improve pain-free mobility/ ADL.    Baseline Supine L shoulder AROM: flexion 68 deg. (pain/guarded), abduction 66 deg. (pain), ER 32 deg.,  IR 85 deg.  Pt. unable to tolerate PROM to L shoulder at this time (>8/10 pain reported).  No MMT.   5/25: L shoulder AROM WFL all planes.  Axillary discomfort with end-range sh. Flexion/ abduction.     Time 8    Period Weeks    Status Goal met   Target Date 01/26/22      PT LONG TERM GOAL #6   Title Pt. will increase FOTO to 58 to improve shoulder functional pain-free mobility.    Baseline Initial FOTO for shoulder: 43.  2/7: 54 (for shoulder on 5th visit).  3/2: 80 (10th visit in [place]).  5/25: Unable to reassess   Time 8    Period Weeks    Status Partially Met    Target Date 02/23/22              Plan -     Clinical Impression Statement Pt is still experiencing tenderness to L suboccipital region, and exhibits a palpable mass to the area. Pt has increased L UE AROM and PROM, with tightness at end range in her pecs and chest. Pt responded well to vertebral mobilizations, and desensitized to hypervolt as tx session progressed. Pt remains limited by chronic generalized pain.  Pt will continue to benefit from skilled PT services to progress pain free mobility and strength.     Personal Factors and Comorbidities Comorbidity 3+    Examination-Activity Limitations Bathing;Bed Mobility;Bend;Carry;Dressing;Hygiene/Grooming;Lift;Locomotion Level;Sit;Squat;Stairs;Toileting;Stand    Examination-Participation Restrictions Cleaning;Laundry;Meal Prep;Community Activity    Stability/Clinical Decision Making Evolving/Moderate complexity    Clinical Decision Making Moderate    Rehab Potential Fair    PT Frequency 2x / week    PT Duration 4 weeks    PT Treatment/Interventions ADLs/Self Care Home  Management;Aquatic Therapy;Cryotherapy;Iontophoresis 48m/ml Dexamethasone;Patient/family education;Neuromuscular re-education;Balance training;Therapeutic exercise;Therapeutic activities;Functional mobility training;Stair training;Gait training;Manual techniques;Passive range of motion;Dry needling;Electrical Stimulation    PT Next Visit Plan 10th visit progress note next tx.  Update goals.     PT Home Exercise Plan [id number]    [email or web address] Agree with Plan of Care Patient            KAndee Lineman SPT MPura Spice PT, DPT # [phone] Therap[facility] 02/20/2022, 4:00 PM

## 2022-02-20 NOTE — Therapy (Cosign Needed)
OUTPATIENT PHYSICAL THERAPY TREATMENT NOTE Physical Therapy Progress Note   Dates of reporting period  01/03/22  to  02/20/22   Patient Name: Kelsey Bell MRN: [id number] DOB:06-21-1981, 41 y.o., female Today's Date: 02/20/2022  PCP: Ricardo Jericho, NP REFERRING PROVIDER: Ricardo Jericho, NP    PT End of Session -     Visit Number 40   Authorization Type 42    Recert date: 1/47/82   Authorization - Visit Number 9   Authorization - Number of Visits 10    PT Start Time 11:31 - 12:05 (34 minutes)   Activity Tolerance Patient limited by pain;Patient tolerated treatment well    Behavior During Therapy [facility] for tasks assessed/performed           Past Medical History:  Diagnosis Date   Allergy    Anxiety    Arthritis    Asthma    Breast pain present for several months   Bil LT >RT across of breasts   CRPS (complex regional pain syndrome type I)    Depression    DVT (deep venous thrombosis) ([place]) 06/2018   Fibromyalgia    Kidney stones    LGSIL on Pap smear of cervix 2007   Migraine    Neuromuscular disorder ([place])    Complex regional pain syndrome and Fibromyalgia   Pancreatic pseudocyst    Pancreatitis    Past Surgical History:  Procedure Laterality Date   COLPOSCOPY  2017   neg bx   ERCP     EXTRACORPOREAL SHOCK WAVE LITHOTRIPSY Right 04/26/2017   Procedure: EXTRACORPOREAL SHOCK WAVE LITHOTRIPSY (ESWL);  Surgeon: Hollice Espy, MD;  Location: [facility] ORS;  Service: Urology;  Laterality: Right;   GASTROSTOMY-JEJEUNOSTOMY TUBE CHANGE/PLACEMENT     KNEE ARTHROSCOPY Right    nj placement     TONSILLECTOMY     UPPER ESOPHAGEAL ENDOSCOPIC ULTRASOUND (EUS)     UPPER GASTROINTESTINAL ENDOSCOPY     Patient Active Problem List   Diagnosis Date Noted   Orthostatic syncope    Dehydration 06/09/2020   Genetic testing 10/24/2018   Idiopathic acute pancreatitis 07/22/2018   DVT (deep venous thrombosis) ([place]) 06/04/2018   Acute pancreatitis 05/27/2018    Right knee pain 05/19/2018   Peripheral edema 03/05/2018   Neuropathic pain 11/02/2017   Kidney stone 04/25/2017   Ureteral stone    Migraines 12/15/2016   Morbid obesity with BMI of 45.0-49.9, adult ([place]) 10/20/2015   Asthma, moderate 05/21/2015   Polyarthralgia 12/01/2011   Fibromyalgia 09/18/2011   Low back pain 09/18/2011    REFERRING DIAG: Sprain of L ankle/ L ankle pain/ L shoulder pain/ Low back pain  THERAPY DIAG:  Pain in left ankle and joints of left foot  Muscle weakness (generalized)  Shoulder joint stiffness, left  Joint stiffness  Acute bilateral low back pain with bilateral sciatica  Ankle joint stiffness, left  PERTINENT HISTORY: see evaluation  PRECAUTIONS: N/A  SUBJECTIVE:  Pt stated that she has been feeling under the weather lately, and has not been able to do much of her HEP. Pt has swollen lymph node in L suboccipital region, and has been extra tired lately. Pt has pain in low back/flank region, which was tender to the touch. Pt's L shoulder has decreased pain, and increased ROM since last visit.   PAIN:  Are you having pain? Yes  L shoulder:  3/10      TODAY'S TREATMENT:  02/20/22: Manual Therapy: Prone: STM to thoracic, lumbar, and sacral paraspinals -  5 mins  Hypervolt to thoracic, lumbar, and sacral paraspinals - 5 mins - pain in sacral region  Grade 2 joint mobs to thoracic, lumbar, and sacral vertebrae - 15 s per vertebrae   There ex: Seated rows on blue ball with blue TB 10 x ; 3 sets  90 deg shoulder flexion punches with 2# weight on blue ball - 10 x 2 B Shoulder abduction (above head) with 2# on blue ball - 10 x 2 B Scaption with 2# weight on blue ball 10 x x 2 B AAROM with wooden pole into shoulder flexion and scaption   Pt education on importance of a gym membership to remain active daily.    02/14/22:  There ex: AROM with wooden pole into shoulder flexion, scaption, horizontal abduction - x 10  Standing doorway stretch  where pt places arm over head onto doorframe and sidebends away from doorframe in order to stretch her lats and intercostals. - 30 s hold B  Manual Therapy: Lymphatic drainage techniques to L UE STM to fingers, forearm, and bicep from proximal to distal extremity PROM of UEs in supine position into flexion and scaption - 30 s holds at end range      PATIENT EDUCATION: Education details: Form/technique with exercise Person educated: Patient Education method: Customer service manager Education comprehension: verbalized understanding and returned demonstration   HOME EXERCISE PROGRAM: [id number]     PT Long Term Goals -      PT LONG TERM GOAL #1   Title Pt will increase FOTO score to 56 to improve overall perceived functional ability.    Baseline IE: 27.  1/3: 27 (no change)- pain limited.  3/2: 77 (ankle/foot)- 20th visit.  5/25: unable to reassess.     Time 8    Period Weeks    Status Goal met   Target Date 02/20/22      PT LONG TERM GOAL #2   Title Pt will increase ankle strength by at least 1/2 MMT grade in order to demonstrate improvement in strength and functio    Baseline L ankle strength grossly 3+/5 MMT (pain limited).  R ankle strength 5/5 MMT.  L ankle remain pain limited.    Time 8    Period Weeks    Status Partially Met    Target Date 02/23/22      PT LONG TERM GOAL #3   Title Pt will increase L ankle AROM to [facility] as compared to R ankle to improve functional mobility.    Baseline L/R ankle AROM: DF (-5/11 deg.), PF (44/58 deg.), IV (12/30 deg.), EV (15/15 deg.).    Time 8    Period Weeks    Status Partially Met    Target Date 02/23/22      PT LONG TERM GOAL #4   Title Pt. will ambulate with normalized gait pattern and consistent heel strike with no increase c/o L ankle pain without use of ankle support/brace.    Baseline L antalgic gait pattern.  7/10 L ankle pain.  01/26/22: marked improvement in ankle pain but occasional flare ups   Time 8    Period  Weeks    Status Partially Met    Target Date 02/23/22      PT LONG TERM GOAL #5   Title Pt. will increase L shoulder AROM to [facility] as compared to R shoulder to improve pain-free mobility/ ADL.    Baseline Supine L shoulder AROM: flexion 68 deg. (pain/guarded), abduction 66 deg. (pain), ER 32  deg., IR 85 deg.  Pt. unable to tolerate PROM to L shoulder at this time (>8/10 pain reported).  No MMT.   5/25: L shoulder AROM WFL all planes.  Axillary discomfort with end-range sh. Flexion/ abduction.     Time 8    Period Weeks    Status Goal met   Target Date 01/26/22      PT LONG TERM GOAL #6   Title Pt. will increase FOTO to 58 to improve shoulder functional pain-free mobility.    Baseline Initial FOTO for shoulder: 43.  2/7: 54 (for shoulder on 5th visit).  3/2: 12 (10th visit in [place]).  5/25: Unable to reassess.  6/19: 69   Time 8    Period Weeks    Status Goal met   Target Date 02/20/22          Plan -     Clinical Impression Statement Pt is still experiencing tenderness to L suboccipital region, and exhibits a palpable mass to the area. Pt has increased L UE AROM and PROM, with tightness at end range in her pecs and chest. Pt responded well to vertebral mobilizations, and desensitized to hypervolt as tx session progressed. Pt remains limited by chronic generalized pain.  Pt will continue to benefit from skilled PT services to progress pain free mobility and strength.     Personal Factors and Comorbidities Comorbidity 3+    Examination-Activity Limitations Bathing;Bed Mobility;Bend;Carry;Dressing;Hygiene/Grooming;Lift;Locomotion Level;Sit;Squat;Stairs;Toileting;Stand    Examination-Participation Restrictions Cleaning;Laundry;Meal Prep;Community Activity    Stability/Clinical Decision Making Evolving/Moderate complexity    Clinical Decision Making Moderate    Rehab Potential Fair    PT Frequency 2x / week    PT Duration 4 weeks    PT Treatment/Interventions ADLs/Self Care Home  Management;Aquatic Therapy;Cryotherapy;Iontophoresis 71m/ml Dexamethasone;Patient/family education;Neuromuscular re-education;Balance training;Therapeutic exercise;Therapeutic activities;Functional mobility training;Stair training;Gait training;Manual techniques;Passive range of motion;Dry needling;Electrical Stimulation    PT Next Visit Plan RECERT vs. DISCHARGE next tx.      PT Home Exercise Plan [email or web address]    [email or web address] Agree with Plan of Care Patient            KAndee Lineman SPT MPura Spice PT, DPT # [phone] Therapist- [facility] 02/20/2022, 4:00 PM

## 2022-02-22 ENCOUNTER — Encounter: Payer: Medicare Other | Admitting: Physical Therapy

## 2022-02-23 ENCOUNTER — Encounter: Payer: Medicare Other | Admitting: Physical Therapy

## 2022-02-27 ENCOUNTER — Encounter: Payer: Medicare Other | Admitting: Physical Therapy

## 2022-03-01 ENCOUNTER — Ambulatory Visit: Payer: Medicare Other | Admitting: Physical Therapy

## 2022-03-01 DIAGNOSIS — M6281 Muscle weakness (generalized): Secondary | ICD-10-CM

## 2022-03-01 DIAGNOSIS — M5441 Lumbago with sciatica, right side: Secondary | ICD-10-CM

## 2022-03-01 DIAGNOSIS — M25672 Stiffness of left ankle, not elsewhere classified: Secondary | ICD-10-CM

## 2022-03-01 DIAGNOSIS — M25612 Stiffness of left shoulder, not elsewhere classified: Secondary | ICD-10-CM

## 2022-03-01 DIAGNOSIS — M2560 Stiffness of unspecified joint, not elsewhere classified: Secondary | ICD-10-CM

## 2022-03-01 DIAGNOSIS — M25572 Pain in left ankle and joints of left foot: Secondary | ICD-10-CM

## 2022-03-02 ENCOUNTER — Encounter: Payer: Self-pay | Admitting: Physical Therapy

## 2022-03-02 NOTE — Therapy (Signed)
OUTPATIENT PHYSICAL THERAPY TREATMENT NOTE/ RECERTIFICATION    Patient Name: Kelsey Bell MRN: [id number] DOB:1981-04-11, 41 y.o., female Today's Date: 03/02/2022  PCP: Kelsey Jericho, NP REFERRING PROVIDER: Ricardo Jericho, NP    PT End of Session -     Visit Number [phone]   Authorization Type [id number]   Recert date: 8/75/64   Authorization - Visit Number 1   Authorization - Number of Visits 10    PT Start Time 3329 - 5188 (49 minutes)   Activity Tolerance Patient limited by pain;Patient tolerated treatment well    Behavior During Therapy [facility] for tasks assessed/performed           Past Medical History:  Diagnosis Date   Allergy    Anxiety    Arthritis    Asthma    Breast pain present for several months   Bil LT >RT across of breasts   CRPS (complex regional pain syndrome type I)    Depression    DVT (deep venous thrombosis) ([place]) 06/2018   Fibromyalgia    Kidney stones    LGSIL on Pap smear of cervix 2007   Migraine    Neuromuscular disorder ([place])    Complex regional pain syndrome and Fibromyalgia   Pancreatic pseudocyst    Pancreatitis    Past Surgical History:  Procedure Laterality Date   COLPOSCOPY  2017   neg bx   ERCP     EXTRACORPOREAL SHOCK WAVE LITHOTRIPSY Right 04/26/2017   Procedure: EXTRACORPOREAL SHOCK WAVE LITHOTRIPSY (ESWL);  Surgeon: Hollice Espy, MD;  Location: [facility] ORS;  Service: Urology;  Laterality: Right;   GASTROSTOMY-JEJEUNOSTOMY TUBE CHANGE/PLACEMENT     KNEE ARTHROSCOPY Right    nj placement     TONSILLECTOMY     UPPER ESOPHAGEAL ENDOSCOPIC ULTRASOUND (EUS)     UPPER GASTROINTESTINAL ENDOSCOPY     Patient Active Problem List   Diagnosis Date Noted   Orthostatic syncope    Dehydration 06/09/2020   Genetic testing 10/24/2018   Idiopathic acute pancreatitis 07/22/2018   DVT (deep venous thrombosis) ([place]) 06/04/2018   Acute pancreatitis 05/27/2018   Right knee pain 05/19/2018   Peripheral edema 03/05/2018    Neuropathic pain 11/02/2017   Kidney stone 04/25/2017   Ureteral stone    Migraines 12/15/2016   Morbid obesity with BMI of 45.0-49.9, adult ([place]) 10/20/2015   Asthma, moderate 05/21/2015   Polyarthralgia 12/01/2011   Fibromyalgia 09/18/2011   Low back pain 09/18/2011    REFERRING DIAG: Sprain of L ankle/ L ankle pain/ L shoulder pain/ Low back pain  THERAPY DIAG:  Pain in left ankle and joints of left foot  Muscle weakness (generalized)  Shoulder joint stiffness, left  Joint stiffness  Acute bilateral low back pain with bilateral sciatica  Ankle joint stiffness, left  PERTINENT HISTORY: see evaluation  PRECAUTIONS: N/A  SUBJECTIVE:  Pt. Has not been to PT since 6/19 secondary to illness.  Pt. Reports she had a fibro flare resulting in an increase in L shoulder/ collarbone pain.  Pt. States blood sugar as been better (124) and no MD appts. Coming up.    PAIN:  Are you having pain? Yes  L shoulder:  5/10      TODAY'S TREATMENT:    03/01/22:    Manual tx.:    Supine B LE/lumbar/L shoulder generalized stretches (all planes) with static holds as tolerated 3x each.     Prone STM to thoracic/lumbar/sacral paraspinals (8 min.).   STM with use of Hypervolt (better  pain tolerance with use of Hypervolt as compared to manual).      Seated L scapular/ shoulder reassessment (all planes)- good ROM/ mobility.      There.ex.:    Reviewed HEP.   Nustep L4 10 min. With B UE/LE at end of tx.  Consistent cadence/ good L ankle positioning.     02/20/22: Manual Therapy: Prone: STM to thoracic, lumbar, and sacral paraspinals - 5 mins  Hypervolt to thoracic, lumbar, and sacral paraspinals - 5 mins - pain in sacral region  Grade 2 joint mobs to thoracic, lumbar, and sacral vertebrae - 15 s per vertebrae   There ex: Seated rows on blue ball with blue TB 10 x ; 3 sets  90 deg shoulder flexion punches with 2# weight on blue ball - 10 x 2 B Shoulder abduction (above head) with 2# on  blue ball - 10 x 2 B Scaption with 2# weight on blue ball 10 x x 2 B AAROM with wooden pole into shoulder flexion and scaption   Pt education on importance of a gym membership to remain active daily.    02/14/22:  There ex: AROM with wooden pole into shoulder flexion, scaption, horizontal abduction - x 10  Standing doorway stretch where pt places arm over head onto doorframe and sidebends away from doorframe in order to stretch her lats and intercostals. - 30 s hold B  Manual Therapy: Lymphatic drainage techniques to L UE STM to fingers, forearm, and bicep from proximal to distal extremity PROM of UEs in supine position into flexion and scaption - 30 s holds at end range      PATIENT EDUCATION: Education details: Form/technique with exercise Person educated: Patient Education method: Customer service manager Education comprehension: verbalized understanding and returned demonstration   HOME EXERCISE PROGRAM: [email or web address]     PT Long Term Goals -      PT LONG TERM GOAL #1   Title Pt will increase FOTO score to 56 to improve overall perceived functional ability.    Baseline IE: 27.  1/3: 27 (no change)- pain limited.  3/2: 56 (ankle/foot)- 20th visit.  5/25: unable to reassess.     Time 8    Period Weeks    Status Goal met   Target Date 02/20/22      PT LONG TERM GOAL #2   Title Pt will increase ankle strength by at least 1/2 MMT grade in order to demonstrate improvement in strength and functio    Baseline L ankle strength grossly 3+/5 MMT (pain limited).  R ankle strength 5/5 MMT.  L ankle remain pain limited.  6/28: L ankle IV/EV limited to 4/5 MMT (marked pain)   Time 4   Period Weeks    Status Partially Met    Target Date 03/29/22      PT LONG TERM GOAL #3   Title Pt will increase L ankle AROM to [facility] as compared to R ankle to improve functional mobility.    Baseline L/R ankle AROM: DF (-5/11 deg.), PF (44/58 deg.), IV (12/30 deg.), EV (15/15 deg.).  6/28: L/R  ankle AROM: DF (WNL), PF (WNL), IV (24/36 deg.), EV (8/30 deg.)- pain limited.     Time 4   Period Weeks    Status Partially Met    Target Date 03/29/22      PT LONG TERM GOAL #4   Title Pt. will ambulate with normalized gait pattern and consistent heel strike with no increase c/o L ankle pain without  use of ankle support/brace.    Baseline L antalgic gait pattern.  7/10 L ankle pain.  01/26/22: marked improvement in ankle pain but occasional flare ups   Time 4    Period Weeks    Status Partially Met    Target Date 03/29/22      PT LONG TERM GOAL #5   Title Pt. will increase L shoulder strength to grossly 5/5 MMT to improve  pain-free mobility and return to gym.    Baseline Seated L shoulder strength grossly 4/5 MMT (pain with flexion/ abduction).     Time 8    Period Weeks    Status Partially met   Target Date 04/26/22      PT LONG TERM GOAL #6   Title Pt. Able to tolerate 30 minute work-out at [employer] with no L shoulder pain/ limitations.    Baseline Pt. Has not returned to PF at this time.    Time 8    Period Weeks    Status Not met   Target Date 04/26/22          Plan -     Clinical Impression Statement Pt remains pain limited with palpation over back/ L shoulder region.  Pt has shown marked improvement with L shoulder AROM (all planes) but remains limited with L ankle IV/EV. Pt responded well to vertebral mobilizations, and desensitized to hypervolt as tx session progressed. Pt remains limited by chronic generalized pain.  Pt will continue to benefit from skilled PT services to progress pain free mobility and strength.      Personal Factors and Comorbidities Comorbidity 3+    Examination-Activity Limitations Bathing;Bed Mobility;Bend;Carry;Dressing;Hygiene/Grooming;Lift;Locomotion Level;Sit;Squat;Stairs;Toileting;Stand    Examination-Participation Restrictions Cleaning;Laundry;Meal Prep;Community Activity    Stability/Clinical Decision Making Evolving/Moderate  complexity    Clinical Decision Making Moderate    Rehab Potential Fair    PT Frequency 2x / week    PT Duration 8 weeks    PT Treatment/Interventions ADLs/Self Care Home Management;Aquatic Therapy;Cryotherapy;Iontophoresis 36m/ml Dexamethasone;Patient/family education;Neuromuscular re-education;Balance training;Therapeutic exercise;Therapeutic activities;Functional mobility training;Stair training;Gait training;Manual techniques;Passive range of motion;Dry needling;Electrical Stimulation    PT Next Visit Plan Progress strength training.        PT Home Exercise Plan [id number]    [id number] Agree with Plan of Care Patient          MPura Spice PT, DPT # [id number] Therapist- [facility] 03/02/2022, 1:29 PM

## 2022-03-08 ENCOUNTER — Ambulatory Visit: Payer: Medicare Other | Admitting: Physical Therapy

## 2022-03-09 ENCOUNTER — Ambulatory Visit: Payer: Medicare Other | Admitting: Physical Therapy

## 2022-03-13 ENCOUNTER — Ambulatory Visit: Payer: Medicare Other | Attending: Orthopedic Surgery

## 2022-03-13 DIAGNOSIS — M5441 Lumbago with sciatica, right side: Secondary | ICD-10-CM | POA: Diagnosis present

## 2022-03-13 DIAGNOSIS — M25672 Stiffness of left ankle, not elsewhere classified: Secondary | ICD-10-CM | POA: Diagnosis present

## 2022-03-13 DIAGNOSIS — M2560 Stiffness of unspecified joint, not elsewhere classified: Secondary | ICD-10-CM | POA: Diagnosis present

## 2022-03-13 DIAGNOSIS — M25612 Stiffness of left shoulder, not elsewhere classified: Secondary | ICD-10-CM | POA: Insufficient documentation

## 2022-03-13 DIAGNOSIS — M25572 Pain in left ankle and joints of left foot: Secondary | ICD-10-CM | POA: Insufficient documentation

## 2022-03-13 DIAGNOSIS — M6281 Muscle weakness (generalized): Secondary | ICD-10-CM | POA: Diagnosis present

## 2022-03-13 DIAGNOSIS — M5442 Lumbago with sciatica, left side: Secondary | ICD-10-CM | POA: Insufficient documentation

## 2022-03-13 NOTE — Therapy (Addendum)
OUTPATIENT PHYSICAL THERAPY TREATMENT NOTE    Patient Name: Kelsey Bell MRN: [id number] DOB:10-11-1980, 41 y.o., female Today's Date: 03/13/2022  PCP: Ricardo Jericho, NP REFERRING PROVIDER: Ricardo Jericho, NP    PT End of Session -     Visit Number 47   Authorization Type [id number]   Recert date: 04/30/06   Authorization - Visit Number 2   Authorization - Number of Visits 10    PT Start Time 8675 - 4492 (49 minutes)   Activity Tolerance Patient limited by pain;Patient tolerated treatment well    Behavior During Therapy [facility] for tasks assessed/performed           Past Medical History:  Diagnosis Date   Allergy    Anxiety    Arthritis    Asthma    Breast pain present for several months   Bil LT >RT across of breasts   CRPS (complex regional pain syndrome type I)    Depression    DVT (deep venous thrombosis) ([place]) 06/2018   Fibromyalgia    Kidney stones    LGSIL on Pap smear of cervix 2007   Migraine    Neuromuscular disorder ([place])    Complex regional pain syndrome and Fibromyalgia   Pancreatic pseudocyst    Pancreatitis    Past Surgical History:  Procedure Laterality Date   COLPOSCOPY  2017   neg bx   ERCP     EXTRACORPOREAL SHOCK WAVE LITHOTRIPSY Right 04/26/2017   Procedure: EXTRACORPOREAL SHOCK WAVE LITHOTRIPSY (ESWL);  Surgeon: Hollice Espy, MD;  Location: [facility] ORS;  Service: Urology;  Laterality: Right;   GASTROSTOMY-JEJEUNOSTOMY TUBE CHANGE/PLACEMENT     KNEE ARTHROSCOPY Right    nj placement     TONSILLECTOMY     UPPER ESOPHAGEAL ENDOSCOPIC ULTRASOUND (EUS)     UPPER GASTROINTESTINAL ENDOSCOPY     Patient Active Problem List   Diagnosis Date Noted   Orthostatic syncope    Dehydration 06/09/2020   Genetic testing 10/24/2018   Idiopathic acute pancreatitis 07/22/2018   DVT (deep venous thrombosis) ([place]) 06/04/2018   Acute pancreatitis 05/27/2018   Right knee pain 05/19/2018   Peripheral edema 03/05/2018   Neuropathic pain  11/02/2017   Kidney stone 04/25/2017   Ureteral stone    Migraines 12/15/2016   Morbid obesity with BMI of 45.0-49.9, adult ([place]) 10/20/2015   Asthma, moderate 05/21/2015   Polyarthralgia 12/01/2011   Fibromyalgia 09/18/2011   Low back pain 09/18/2011    REFERRING DIAG: Sprain of L ankle/ L ankle pain/ L shoulder pain/ Low back pain  THERAPY DIAG:  Pain in left ankle and joints of left foot  Acute bilateral low back pain with bilateral sciatica  Muscle weakness (generalized)  Ankle joint stiffness, left  Shoulder joint stiffness, left  Joint stiffness  PERTINENT HISTORY: see evaluation  PRECAUTIONS: N/A  SUBJECTIVE:  Pt had minimal shoulder pain while entering tx, and was also experiencing instability in L ankle and limited ROM in L shoulder during active movement. Pt notes pain at end range L shoulder flexion, and pain at approximately 90 degrees of L shoulder abduction. Pt has been experiencing minor nausea from day to day due to pancreatitis, but has been much more active over the past 48 hours.   PAIN:  Are you having pain? Yes  L shoulder:  3/10 - 8/10 at end range   TODAY'S TREATMENT:  03/13/22: Manual Therapy:  Supine stretching of L shoulder flexion / abduction - overpressure at end range Supine stretching of  hip flexion / ER / IR with overpressure at end range bilaterally   There ex:  Nautilus : 30 # scapular retraction 20 x - 2 sets with staggered stance  Nautilus : 30 # lat pull downs 20 x  - 2 sets with staggered stance   Standing AAROM with wooden pole into shoulder abduction / extension / flexion - pain in anterior L shoulder throughout all ranges except shoulder extension   Seated 4 way ankle intervention in long sitting - DF/PF/inversion/eversion with green TB 20 x per direction, bilaterally   6 inch front step ups with blue airex pad on floor; 20x ea R and L  Pt education on importance of a gym membership to remain active daily.      03/01/22:    Manual tx.:    Supine B LE/lumbar/L shoulder generalized stretches (all planes) with static holds as tolerated 3x each.     Prone STM to thoracic/lumbar/sacral paraspinals (8 min.).   STM with use of Hypervolt (better pain tolerance with use of Hypervolt as compared to manual).      Seated L scapular/ shoulder reassessment (all planes)- good ROM/ mobility.      There.ex.:    Reviewed HEP.   Nustep L4 10 min. With B UE/LE at end of tx.  Consistent cadence/ good L ankle positioning.     PATIENT EDUCATION: Education details: Form/technique with exercise Person educated: Patient Education method: Customer service manager Education comprehension: verbalized understanding and returned demonstration   HOME EXERCISE PROGRAM: [place] #1   Title Pt will increase FOTO score to 56 to improve overall perceived functional ability.    Baseline IE: 27.  1/3: 27 (no change)- pain limited.  3/2: 9 (ankle/foot)- 20th visit.  5/25: unable to reassess.     Time 8    Period Weeks    Status Goal met   Target Date 02/20/22      PT LONG TERM GOAL #2   Title Pt will increase ankle strength by at least 1/2 MMT grade in order to demonstrate improvement in strength and functio    Baseline L ankle strength grossly 3+/5 MMT (pain limited).  R ankle strength 5/5 MMT.  L ankle remain pain limited.  6/28: L ankle IV/EV limited to 4/5 MMT (marked pain)   Time 4   Period Weeks    Status Partially Met    Target Date 03/29/22      PT LONG TERM GOAL #3   Title Pt will increase L ankle AROM to [facility] as compared to R ankle to improve functional mobility.    Baseline L/R ankle AROM: DF (-5/11 deg.), PF (44/58 deg.), IV (12/30 deg.), EV (15/15 deg.).  6/28: L/R ankle AROM: DF (WNL), PF (WNL), IV (24/36 deg.), EV (8/30 deg.)- pain limited.     Time 4   Period Weeks    Status Partially Met    Target Date 03/29/22      PT LONG TERM GOAL #4    Title Pt. will ambulate with normalized gait pattern and consistent heel strike with no increase c/o L ankle pain without use of ankle support/brace.    Baseline L antalgic gait pattern.  7/10 L ankle pain.  01/26/22: marked improvement in ankle pain but occasional flare ups   Time 4    Period Weeks    Status Partially Met    Target Date 03/29/22  PT LONG TERM GOAL #5   Title Pt. will increase L shoulder strength to grossly 5/5 MMT to improve  pain-free mobility and return to gym.    Baseline Seated L shoulder strength grossly 4/5 MMT (pain with flexion/ abduction).     Time 8    Period Weeks    Status Partially met   Target Date 04/26/22      PT LONG TERM GOAL #6   Title Pt. Able to tolerate 30 minute work-out at [employer] with no L shoulder pain/ limitations.    Baseline Pt. Has not returned to PF at this time.    Time 8    Period Weeks    Status Not met   Target Date 04/26/22          Plan -     Clinical Impression Statement Pt has shown marked improvement with ability to perform L shoulder AROM (all planes, but has pain at end range shoulder flexion and mid-range L shoulder abduction. Pt had pain with supine L shoulder inferior glenohumeral mobs, so they were not continued in tx. Pt remains limited by chronic generalized pain.  Pt will continue to benefit from skilled PT services to progress pain free mobility and strength. Pt ed was provided on the importance of a gym membership.    Personal Factors and Comorbidities Comorbidity 3+    Examination-Activity Limitations Bathing;Bed Mobility;Bend;Carry;Dressing;Hygiene/Grooming;Lift;Locomotion Level;Sit;Squat;Stairs;Toileting;Stand    Examination-Participation Restrictions Cleaning;Laundry;Meal Prep;Community Activity    Stability/Clinical Decision Making Evolving/Moderate complexity    Clinical Decision Making Moderate    Rehab Potential Fair    PT Frequency 2x / week    PT Duration 8 weeks    PT  Treatment/Interventions ADLs/Self Care Home Management;Aquatic Therapy;Cryotherapy;Iontophoresis 11m/ml Dexamethasone;Patient/family education;Neuromuscular re-education;Balance training;Therapeutic exercise;Therapeutic activities;Functional mobility training;Stair training;Gait training;Manual techniques;Passive range of motion;Dry needling;Electrical Stimulation    PT Next Visit Plan Progress strength training.        PT Home Exercise Plan [id number]   Consulted and Agree with Plan of Care Patient         KAndee Lineman SPT 03/13/2022, 12:36 PM RMerdis Delay PT, DPT, OCS  #[phone]

## 2022-03-15 ENCOUNTER — Ambulatory Visit: Payer: Medicare Other

## 2022-03-15 DIAGNOSIS — M25612 Stiffness of left shoulder, not elsewhere classified: Secondary | ICD-10-CM

## 2022-03-15 DIAGNOSIS — M2560 Stiffness of unspecified joint, not elsewhere classified: Secondary | ICD-10-CM

## 2022-03-15 DIAGNOSIS — M25572 Pain in left ankle and joints of left foot: Secondary | ICD-10-CM | POA: Diagnosis not present

## 2022-03-15 DIAGNOSIS — M5441 Lumbago with sciatica, right side: Secondary | ICD-10-CM

## 2022-03-15 DIAGNOSIS — M25672 Stiffness of left ankle, not elsewhere classified: Secondary | ICD-10-CM

## 2022-03-15 DIAGNOSIS — M6281 Muscle weakness (generalized): Secondary | ICD-10-CM

## 2022-03-15 NOTE — Therapy (Signed)
OUTPATIENT PHYSICAL THERAPY TREATMENT NOTE    Patient Name: Kelsey Bell MRN: [id number] DOB:13-Oct-1980, 41 y.o., female Today's Date: 03/15/2022  PCP: Ricardo Jericho, NP REFERRING PROVIDER: Ricardo Jericho, NP    PT End of Session -     Visit Number [id number]   Authorization Type [id number]   Recert date: 05/02/92   Authorization - Visit Number 3   Authorization - Number of Visits 10    PT Start Time 1125-1200   Activity Tolerance Patient limited by pain;Patient tolerated treatment well    Behavior During Therapy [facility] for tasks assessed/performed           Past Medical History:  Diagnosis Date   Allergy    Anxiety    Arthritis    Asthma    Breast pain present for several months   Bil LT >RT across of breasts   CRPS (complex regional pain syndrome type I)    Depression    DVT (deep venous thrombosis) ([place]) 06/2018   Fibromyalgia    Kidney stones    LGSIL on Pap smear of cervix 2007   Migraine    Neuromuscular disorder ([place])    Complex regional pain syndrome and Fibromyalgia   Pancreatic pseudocyst    Pancreatitis    Past Surgical History:  Procedure Laterality Date   COLPOSCOPY  2017   neg bx   ERCP     EXTRACORPOREAL SHOCK WAVE LITHOTRIPSY Right 04/26/2017   Procedure: EXTRACORPOREAL SHOCK WAVE LITHOTRIPSY (ESWL);  Surgeon: Hollice Espy, MD;  Location: [facility] ORS;  Service: Urology;  Laterality: Right;   GASTROSTOMY-JEJEUNOSTOMY TUBE CHANGE/PLACEMENT     KNEE ARTHROSCOPY Right    nj placement     TONSILLECTOMY     UPPER ESOPHAGEAL ENDOSCOPIC ULTRASOUND (EUS)     UPPER GASTROINTESTINAL ENDOSCOPY     Patient Active Problem List   Diagnosis Date Noted   Orthostatic syncope    Dehydration 06/09/2020   Genetic testing 10/24/2018   Idiopathic acute pancreatitis 07/22/2018   DVT (deep venous thrombosis) ([place]) 06/04/2018   Acute pancreatitis 05/27/2018   Right knee pain 05/19/2018   Peripheral edema 03/05/2018   Neuropathic pain 11/02/2017   Kidney  stone 04/25/2017   Ureteral stone    Migraines 12/15/2016   Morbid obesity with BMI of 45.0-49.9, adult ([place]) 10/20/2015   Asthma, moderate 05/21/2015   Polyarthralgia 12/01/2011   Fibromyalgia 09/18/2011   Low back pain 09/18/2011    REFERRING DIAG: Sprain of L ankle/ L ankle pain/ L shoulder pain/ Low back pain  THERAPY DIAG:  Pain in left ankle and joints of left foot  Acute bilateral low back pain with bilateral sciatica  Muscle weakness (generalized)  Ankle joint stiffness, left  Shoulder joint stiffness, left  Joint stiffness  PERTINENT HISTORY: see evaluation  PRECAUTIONS: N/A  SUBJECTIVE:  Pt states her L knee is sore upon arrival.  She did not notice an increase in ankle or shoulder pain after last PT session.  She also reports having some difficulty with her blood sugar levels  dropping recently.  She has been following up with her PCP about this.  PAIN:  Are you having pain? Yes  L knee:  4/10    TODAY'S TREATMENT:  03/15/22: There ex: Nustep L5-6 x10 min. Consistent cadence/ good L ankle positioning.    Seated  shoulder "Y" seated on physioball: 1 lb  L, 2 lb R, x10 ea Seated shoulder flexion on blue physioball: 1 lb L, 2lb R, x10 ea Seated  marches on physioball with core "bracing" x20 ea Seated marches on blue physioball with opposite alternating UE punch to shoulder flex 90 deg x20 ea Seated marches with alternating opposite shoulder abduction to 90 deg x20 ea  Tandem walk on airex balance beam in // bars- forward and reverse x 5 laps (for ankle strength/stability)  Manual Therapy: Prone STM with use of Hypervolt thoracic/lumbar paraspinals and medial L scapular mm, 5 min (for pain control and muscular dysfunction/tightness in L UT and lumbothoracic paraspinal mm)  Pt education: reviewed the plan to initiate gym membership at [employer] when she transitions from PT to independent exercise program.     03/13/22: Manual Therapy:  Supine  stretching of L shoulder flexion / abduction - overpressure at end range Supine stretching of hip flexion / ER / IR with overpressure at end range bilaterally   There ex:  Nautilus : 30 # scapular retraction 20 x - 2 sets with staggered stance  Nautilus : 30 # lat pull downs 20 x  - 2 sets with staggered stance   Standing AAROM with wooden pole into shoulder abduction / extension / flexion - pain in anterior L shoulder throughout all ranges except shoulder extension   Seated 4 way ankle intervention in long sitting - DF/PF/inversion/eversion with green TB 20 x per direction, bilaterally   6 inch front step ups with blue airex pad on floor; 20x ea R and L  Pt education: reviewed the plan to initiate gym membership at [employer] when she transitions from PT to independent exercise program.     PATIENT EDUCATION: Education details: Form/technique with exercise Person educated: Patient Education method: Customer service manager Education comprehension: verbalized understanding and returned demonstration   HOME EXERCISE PROGRAM: [email or web address]     PT Long Term Goals -      PT LONG TERM GOAL #1   Title Pt will increase FOTO score to 56 to improve overall perceived functional ability.    Baseline IE: 27.  1/3: 27 (no change)- pain limited.  3/2: 31 (ankle/foot)- 20th visit.  5/25: unable to reassess.     Time 8    Period Weeks    Status Goal met   Target Date 02/20/22      PT LONG TERM GOAL #2   Title Pt will increase ankle strength by at least 1/2 MMT grade in order to demonstrate improvement in strength and functio    Baseline L ankle strength grossly 3+/5 MMT (pain limited).  R ankle strength 5/5 MMT.  L ankle remain pain limited.  6/28: L ankle IV/EV limited to 4/5 MMT (marked pain)   Time 4   Period Weeks    Status Partially Met    Target Date 03/29/22      PT LONG TERM GOAL #3   Title Pt will increase L ankle AROM to [facility] as compared to R ankle to improve functional  mobility.    Baseline L/R ankle AROM: DF (-5/11 deg.), PF (44/58 deg.), IV (12/30 deg.), EV (15/15 deg.).  6/28: L/R ankle AROM: DF (WNL), PF (WNL), IV (24/36 deg.), EV (8/30 deg.)- pain limited.     Time 4   Period Weeks    Status Partially Met    Target Date 03/29/22      PT LONG TERM GOAL #4   Title Pt. will ambulate with normalized gait pattern and consistent heel strike with no increase c/o L ankle pain without use of ankle support/brace.    Baseline L antalgic  gait pattern.  7/10 L ankle pain.  01/26/22: marked improvement in ankle pain but occasional flare ups   Time 4    Period Weeks    Status Partially Met    Target Date 03/29/22      PT LONG TERM GOAL #5   Title Pt. will increase L shoulder strength to grossly 5/5 MMT to improve  pain-free mobility and return to gym.    Baseline Seated L shoulder strength grossly 4/5 MMT (pain with flexion/ abduction).     Time 8    Period Weeks    Status Partially met   Target Date 04/26/22      PT LONG TERM GOAL #6   Title Pt. Able to tolerate 30 minute work-out at [employer] with no L shoulder pain/ limitations.    Baseline Pt. Has not returned to PF at this time.    Time 8    Period Weeks    Status Not met   Target Date 04/26/22          Plan -     Clinical Impression Statement Tx today focused on interventions for shoulder, trunk, and ankle strength.  Pt was appropriately challenged with dual tasking trunk and shoulder combined strengthening exercises sitting on physioball.  She had no loss of balance on airex balance beam.  Overall tolerated abbreviated PT session well (arrived a little late today) and responded well to verbal motivational cues during tx.  Plan to resume full tx session at next visit.     Personal Factors and Comorbidities Comorbidity 3+    Examination-Activity Limitations Bathing;Bed Mobility;Bend;Carry;Dressing;Hygiene/Grooming;Lift;Locomotion Level;Sit;Squat;Stairs;Toileting;Stand     Examination-Participation Restrictions Cleaning;Laundry;Meal Prep;Community Activity    Stability/Clinical Decision Making Evolving/Moderate complexity    Clinical Decision Making Moderate    Rehab Potential Fair    PT Frequency 2x / week    PT Duration 8 weeks    PT Treatment/Interventions ADLs/Self Care Home Management;Aquatic Therapy;Cryotherapy;Iontophoresis 74m/ml Dexamethasone;Patient/family education;Neuromuscular re-education;Balance training;Therapeutic exercise;Therapeutic activities;Functional mobility training;Stair training;Gait training;Manual techniques;Passive range of motion;Dry needling;Electrical Stimulation    PT Next Visit Plan Progress strength training.        PT Home Exercise Plan [email or web address]    [email or web address] Agree with Plan of Care Patient          03/15/2022, 12:32 PM RMerdis Delay PT, DPT, OCS  #[phone] [facility]MK[facility] [place] [address]Phone: [phone]  Fax:  [phone] Patient Details  Name: Kelsey TROPEAMRN: [id number] of Birth: 1Sep 05, 1982Referring Provider:  LAnderson Malta MD  Encounter Date: 03/15/2022   RPincus Badder PT 03/15/2022, 12:32 PM   ASt [facility] HospitalMV[facility] [place] [address]Phone: [phone]  Fax:  [phone]

## 2022-03-20 ENCOUNTER — Ambulatory Visit: Payer: Medicare Other | Admitting: Physical Therapy

## 2022-03-22 ENCOUNTER — Ambulatory Visit: Payer: Medicare Other

## 2022-03-22 DIAGNOSIS — M25612 Stiffness of left shoulder, not elsewhere classified: Secondary | ICD-10-CM

## 2022-03-22 DIAGNOSIS — M25572 Pain in left ankle and joints of left foot: Secondary | ICD-10-CM

## 2022-03-22 DIAGNOSIS — M25672 Stiffness of left ankle, not elsewhere classified: Secondary | ICD-10-CM

## 2022-03-22 DIAGNOSIS — M2560 Stiffness of unspecified joint, not elsewhere classified: Secondary | ICD-10-CM

## 2022-03-22 DIAGNOSIS — M6281 Muscle weakness (generalized): Secondary | ICD-10-CM

## 2022-03-22 DIAGNOSIS — M5441 Lumbago with sciatica, right side: Secondary | ICD-10-CM

## 2022-03-22 NOTE — Therapy (Signed)
OUTPATIENT PHYSICAL THERAPY TREATMENT NOTE    Patient Name: Kelsey Bell MRN: [id number] DOB:June 27, 1981, 41 y.o., female Today's Date: 03/22/2022  PCP: Ricardo Jericho, NP REFERRING PROVIDER: Ricardo Jericho, NP    PT End of Session -     Visit Number [phone]   Authorization Type [id number]   Recert date: 10/27/95   Authorization - Visit Number 3   Authorization - Number of Visits 10    PT Start Time 1125-1200   Activity Tolerance Patient limited by pain;Patient tolerated treatment well    Behavior During Therapy [facility] for tasks assessed/performed           Past Medical History:  Diagnosis Date   Allergy    Anxiety    Arthritis    Asthma    Breast pain present for several months   Bil LT >RT across of breasts   CRPS (complex regional pain syndrome type I)    Depression    DVT (deep venous thrombosis) ([place]) 06/2018   Fibromyalgia    Kidney stones    LGSIL on Pap smear of cervix 2007   Migraine    Neuromuscular disorder ([place])    Complex regional pain syndrome and Fibromyalgia   Pancreatic pseudocyst    Pancreatitis    Past Surgical History:  Procedure Laterality Date   COLPOSCOPY  2017   neg bx   ERCP     EXTRACORPOREAL SHOCK WAVE LITHOTRIPSY Right 04/26/2017   Procedure: EXTRACORPOREAL SHOCK WAVE LITHOTRIPSY (ESWL);  Surgeon: Hollice Espy, MD;  Location: [facility] ORS;  Service: Urology;  Laterality: Right;   GASTROSTOMY-JEJEUNOSTOMY TUBE CHANGE/PLACEMENT     KNEE ARTHROSCOPY Right    nj placement     TONSILLECTOMY     UPPER ESOPHAGEAL ENDOSCOPIC ULTRASOUND (EUS)     UPPER GASTROINTESTINAL ENDOSCOPY     Patient Active Problem List   Diagnosis Date Noted   Orthostatic syncope    Dehydration 06/09/2020   Genetic testing 10/24/2018   Idiopathic acute pancreatitis 07/22/2018   DVT (deep venous thrombosis) ([place]) 06/04/2018   Acute pancreatitis 05/27/2018   Right knee pain 05/19/2018   Peripheral edema 03/05/2018   Neuropathic pain 11/02/2017   Kidney  stone 04/25/2017   Ureteral stone    Migraines 12/15/2016   Morbid obesity with BMI of 45.0-49.9, adult ([place]) 10/20/2015   Asthma, moderate 05/21/2015   Polyarthralgia 12/01/2011   Fibromyalgia 09/18/2011   Low back pain 09/18/2011    REFERRING DIAG: Sprain of L ankle/ L ankle pain/ L shoulder pain/ Low back pain  THERAPY DIAG:  Pain in left ankle and joints of left foot  Acute bilateral low back pain with bilateral sciatica  Muscle weakness (generalized)  Ankle joint stiffness, left  Shoulder joint stiffness, left  Joint stiffness  PERTINENT HISTORY: see evaluation  PRECAUTIONS: N/A  SUBJECTIVE: Pt states her L knee is sore upon arrival, with 8/10 pain. Pt was limping upon entrance to the clinic. Pt experienced this pain when lying in bed two night ago, as she felt her knee got stuck in a flexed position, and needed to "pop" it back into extension. Pt has pain in medial knee, which is worse in SLS. She did not notice an increase in ankle or shoulder pain after last PT session.  She also reports having some difficulty with her blood sugar levels dropping recently.   PAIN:  Are you having pain? Yes  L knee:  8/10    TODAY'S TREATMENT:  03/22/22: L knee assessment upon arrival: Medial/lateral  patellar mobs, pt notes medial knee discomfort with medial mob today; superior/inferior mobs- no pain noted; varus stress test pt notes discomfort today along medial knee; SLS pt notes medial knee pain, held on performing thessaly's after that; pt reluctant to PT performing McMurray's and demonstrated mm guarding    Therapeutic Exercise: UBE: 3 min forward/3 min reverse  Scapular retraction 20 x with blue TB - 2 sets  Horizontal abduction - 20 x - 1 set with green TB Shoulder ER 20 x 1 set with green TB  Blue physioball on table in standing: trunk lean forward/shoulder flex x10, side bend/shoulder abd x Standing shoulder wand shoulder horizontal add/abd x 10 alternating UE Standing  shoulder scaption with wand x10 alternating UE   Pt education regarding quadriceps isometrics in long sitting 5 sec hold x10, throughout day, ice  15 min for knee pain for HEP today  03/15/22: There ex: Nustep L5-6 x10 min. Consistent cadence/ good L ankle positioning.    Seated  shoulder "Y" seated on physioball: 1 lb  L, 2 lb R, x10 ea Seated shoulder flexion on blue physioball: 1 lb L, 2lb R, x10 ea Seated marches on physioball with core "bracing" x20 ea Seated marches on blue physioball with opposite alternating UE punch to shoulder flex 90 deg x20 ea Seated marches with alternating opposite shoulder abduction to 90 deg x20 ea  Tandem walk on airex balance beam in // bars- forward and reverse x 5 laps (for ankle strength/stability)  Manual Therapy: Prone STM with use of Hypervolt thoracic/lumbar paraspinals and medial L scapular mm, 5 min (for pain control and muscular dysfunction/tightness in L UT and lumbothoracic paraspinal mm)  Pt education: reviewed the plan to initiate gym membership at [employer] when she transitions from PT to independent exercise program.        PATIENT EDUCATION: Education details: Form/technique with exercise Person educated: Patient Education method: Customer service manager Education comprehension: verbalized understanding and returned demonstration   HOME EXERCISE PROGRAM: [email or web address]     PT Long Term Goals -      PT LONG TERM GOAL #1   Title Pt will increase FOTO score to 56 to improve overall perceived functional ability.    Baseline IE: 27.  1/3: 27 (no change)- pain limited.  3/2: 37 (ankle/foot)- 20th visit.  5/25: unable to reassess.     Time 8    Period Weeks    Status Goal met   Target Date 02/20/22      PT LONG TERM GOAL #2   Title Pt will increase ankle strength by at least 1/2 MMT grade in order to demonstrate improvement in strength and functio    Baseline L ankle strength grossly 3+/5 MMT (pain limited).  R ankle  strength 5/5 MMT.  L ankle remain pain limited.  6/28: L ankle IV/EV limited to 4/5 MMT (marked pain)   Time 4   Period Weeks    Status Partially Met    Target Date 03/29/22      PT LONG TERM GOAL #3   Title Pt will increase L ankle AROM to [facility] as compared to R ankle to improve functional mobility.    Baseline L/R ankle AROM: DF (-5/11 deg.), PF (44/58 deg.), IV (12/30 deg.), EV (15/15 deg.).  6/28: L/R ankle AROM: DF (WNL), PF (WNL), IV (24/36 deg.), EV (8/30 deg.)- pain limited.     Time 4   Period Weeks    Status Partially Met    Target Date  03/29/22      PT LONG TERM GOAL #4   Title Pt. will ambulate with normalized gait pattern and consistent heel strike with no increase c/o L ankle pain without use of ankle support/brace.    Baseline L antalgic gait pattern.  7/10 L ankle pain.  01/26/22: marked improvement in ankle pain but occasional flare ups   Time 4    Period Weeks    Status Partially Met    Target Date 03/29/22      PT LONG TERM GOAL #5   Title Pt. will increase L shoulder strength to grossly 5/5 MMT to improve  pain-free mobility and return to gym.    Baseline Seated L shoulder strength grossly 4/5 MMT (pain with flexion/ abduction).     Time 8    Period Weeks    Status Partially met   Target Date 04/26/22      PT LONG TERM GOAL #6   Title Pt. Able to tolerate 30 minute work-out at [employer] with no L shoulder pain/ limitations.    Baseline Pt. Has not returned to PF at this time.    Time 8    Period Weeks    Status Not met   Target Date 04/26/22          Plan -     Clinical Impression Statement Pt was able to weight bear on L LE, but experienced discomfort throughout standing interventions.  Pt had L shoulder pain with horizontal abduction with blue TB, so her resistance was decreased to green TB for tx. Pt tolerated UBE well, and stated that it was a challenge for her periscapular endurance. Held on nustep/scifit due to her L knee pain today.  Pt will  benefit from future therapeutic intervention of [employer], as long as her knee can tolerate the positional changes.     Personal Factors and Comorbidities Comorbidity 3+    Examination-Activity Limitations Bathing;Bed Mobility;Bend;Carry;Dressing;Hygiene/Grooming;Lift;Locomotion Level;Sit;Squat;Stairs;Toileting;Stand    Examination-Participation Restrictions Cleaning;Laundry;Meal Prep;Community Activity    Stability/Clinical Decision Making Evolving/Moderate complexity    Clinical Decision Making Moderate    Rehab Potential Fair    PT Frequency 2x / week    PT Duration 8 weeks    PT Treatment/Interventions ADLs/Self Care Home Management;Aquatic Therapy;Cryotherapy;Iontophoresis 30m/ml Dexamethasone;Patient/family education;Neuromuscular re-education;Balance training;Therapeutic exercise;Therapeutic activities;Functional mobility training;Stair training;Gait training;Manual techniques;Passive range of motion;Dry needling;Electrical Stimulation    PT Next Visit Plan Progress strength training.        PT Home Exercise Plan [id number]    [id number] Agree with Plan of Care Patient          03/22/2022, 11:42 AM RMerdis Delay PT, DPT, OCS  #[phone] CB[facility] RockHealth AUniversity [facility] Samar[facility] MedicalMC[facility] [place] [address]Phone: [phone]  Fax:  [phone] Patient Details  Name: Kelsey STALLMRN: [id number] of Birth: [birth year]Referring Provider:  LAnderson Malta MD  Encounter Date: 03/22/2022   RPincus Badder PT 03/22/2022, 11:42 AM  [place] [facility] HospitalMS[facility]MLong [place] [address]Phone: [phone]  Fax:  [phone]

## 2022-03-27 ENCOUNTER — Ambulatory Visit: Payer: Medicare Other | Admitting: Physical Therapy

## 2022-03-29 ENCOUNTER — Ambulatory Visit: Payer: Medicare Other | Admitting: Physical Therapy

## 2022-03-29 ENCOUNTER — Encounter: Payer: Self-pay | Admitting: Physical Therapy

## 2022-03-29 DIAGNOSIS — M5441 Lumbago with sciatica, right side: Secondary | ICD-10-CM

## 2022-03-29 DIAGNOSIS — M25672 Stiffness of left ankle, not elsewhere classified: Secondary | ICD-10-CM

## 2022-03-29 DIAGNOSIS — M2560 Stiffness of unspecified joint, not elsewhere classified: Secondary | ICD-10-CM

## 2022-03-29 DIAGNOSIS — M25612 Stiffness of left shoulder, not elsewhere classified: Secondary | ICD-10-CM

## 2022-03-29 DIAGNOSIS — M6281 Muscle weakness (generalized): Secondary | ICD-10-CM

## 2022-03-29 DIAGNOSIS — M25572 Pain in left ankle and joints of left foot: Secondary | ICD-10-CM | POA: Diagnosis not present

## 2022-03-29 NOTE — Therapy (Signed)
OUTPATIENT PHYSICAL THERAPY TREATMENT NOTE    Patient Name: Kelsey Bell MRN: [id number] DOB:10-Aug-1981, 41 y.o., female Today's Date: 03/29/2022  PCP: Ricardo Jericho, NP REFERRING PROVIDER: Ricardo Jericho, NP    PT End of Session -     Visit Number 91   Authorization Type [id number]   Recert date: 0/05/11   Authorization - Visit Number 5   Authorization - Number of Visits 10    PT Start Time 1113 to 1202   Activity Tolerance Patient limited by pain;Patient tolerated treatment well    Behavior During Therapy [facility] for tasks assessed/performed           Past Medical History:  Diagnosis Date   Allergy    Anxiety    Arthritis    Asthma    Breast pain present for several months   Bil LT >RT across of breasts   CRPS (complex regional pain syndrome type I)    Depression    DVT (deep venous thrombosis) ([place]) 06/2018   Fibromyalgia    Kidney stones    LGSIL on Pap smear of cervix 2007   Migraine    Neuromuscular disorder ([place])    Complex regional pain syndrome and Fibromyalgia   Pancreatic pseudocyst    Pancreatitis    Past Surgical History:  Procedure Laterality Date   COLPOSCOPY  2017   neg bx   ERCP     EXTRACORPOREAL SHOCK WAVE LITHOTRIPSY Right 04/26/2017   Procedure: EXTRACORPOREAL SHOCK WAVE LITHOTRIPSY (ESWL);  Surgeon: Hollice Espy, MD;  Location: [facility] ORS;  Service: Urology;  Laterality: Right;   GASTROSTOMY-JEJEUNOSTOMY TUBE CHANGE/PLACEMENT     KNEE ARTHROSCOPY Right    nj placement     TONSILLECTOMY     UPPER ESOPHAGEAL ENDOSCOPIC ULTRASOUND (EUS)     UPPER GASTROINTESTINAL ENDOSCOPY     Patient Active Problem List   Diagnosis Date Noted   Orthostatic syncope    Dehydration 06/09/2020   Genetic testing 10/24/2018   Idiopathic acute pancreatitis 07/22/2018   DVT (deep venous thrombosis) ([place]) 06/04/2018   Acute pancreatitis 05/27/2018   Right knee pain 05/19/2018   Peripheral edema 03/05/2018   Neuropathic pain 11/02/2017    Kidney stone 04/25/2017   Ureteral stone    Migraines 12/15/2016   Morbid obesity with BMI of 45.0-49.9, adult ([place]) 10/20/2015   Asthma, moderate 05/21/2015   Polyarthralgia 12/01/2011   Fibromyalgia 09/18/2011   Low back pain 09/18/2011    REFERRING DIAG: Sprain of L ankle/ L ankle pain/ L shoulder pain/ Low back pain  THERAPY DIAG:  Pain in left ankle and joints of left foot  Acute bilateral low back pain with bilateral sciatica  Muscle weakness (generalized)  Ankle joint stiffness, left  Shoulder joint stiffness, left  Joint stiffness  PERTINENT HISTORY: see evaluation  PRECAUTIONS: N/A  SUBJECTIVE: Pt continues to c/o L knee discomfort/ soreness.  Pt. Returns to pain mgmt on Friday to discuss.  No c/o nausea today.    PAIN:  Are you having pain? Yes  L knee:  8/10   TODAY'S TREATMENT:  03/29/22:  L knee reassessment (no noted swelling but c/o moderate tenderness with light palpation).  L antalgic gait pattern.  Pt. Has rejoined [employer] but has not attended at this time.   Manual Therapy:  Prone STM with use of Hypervolt thoracic/lumbar paraspinals and medial L scapular mm, 5 min (for pain control and muscular dysfunction/tightness in L UT and lumbothoracic paraspinal mm)  Supine L/R LE stretches (generalized).  Full L shoulder AROM noted in supine position.      03/22/22: L knee assessment upon arrival: Medial/lateral patellar mobs, pt notes medial knee discomfort with medial mob today; superior/inferior mobs- no pain noted; varus stress test pt notes discomfort today along medial knee; SLS pt notes medial knee pain, held on performing thessaly's after that; pt reluctant to PT performing McMurray's and demonstrated mm guarding   Therapeutic Exercise: UBE: 3 min forward/3 min reverse  Scapular retraction 20 x with blue TB - 2 sets  Horizontal abduction - 20 x - 1 set with green TB Shoulder ER 20 x 1 set with green TB  Blue physioball on table in  standing: trunk lean forward/shoulder flex x10, side bend/shoulder abd x Standing shoulder wand shoulder horizontal add/abd x 10 alternating UE Standing shoulder scaption with wand x10 alternating UE   Pt education regarding quadriceps isometrics in long sitting 5 sec hold x10, throughout day, ice  15 min for knee pain for HEP today  03/15/22: There ex: Nustep L5-6 x10 min. Consistent cadence/ good L ankle positioning.    Seated  shoulder "Y" seated on physioball: 1 lb  L, 2 lb R, x10 ea Seated shoulder flexion on blue physioball: 1 lb L, 2lb R, x10 ea Seated marches on physioball with core "bracing" x20 ea Seated marches on blue physioball with opposite alternating UE punch to shoulder flex 90 deg x20 ea Seated marches with alternating opposite shoulder abduction to 90 deg x20 ea  Tandem walk on airex balance beam in // bars- forward and reverse x 5 laps (for ankle strength/stability)  Manual Therapy: Prone STM with use of Hypervolt thoracic/lumbar paraspinals and medial L scapular mm, 5 min (for pain control and muscular dysfunction/tightness in L UT and lumbothoracic paraspinal mm)  Pt education: reviewed the plan to initiate gym membership at [employer] when she transitions from PT to independent exercise program.        PATIENT EDUCATION: Education details: Form/technique with exercise Person educated: Patient Education method: Customer service manager Education comprehension: verbalized understanding and returned demonstration   HOME EXERCISE PROGRAM: [email or web address]     PT Long Term Goals -      PT LONG TERM GOAL #1   Title Pt will increase FOTO score to 56 to improve overall perceived functional ability.    Baseline IE: 27.  1/3: 27 (no change)- pain limited.  3/2: 30 (ankle/foot)- 20th visit.  5/25: unable to reassess.     Time 8    Period Weeks    Status Goal met   Target Date 02/20/22      PT LONG TERM GOAL #2   Title Pt will increase ankle strength by  at least 1/2 MMT grade in order to demonstrate improvement in strength and functio    Baseline L ankle strength grossly 3+/5 MMT (pain limited).  R ankle strength 5/5 MMT.  L ankle remain pain limited.  6/28: L ankle IV/EV limited to 4/5 MMT (marked pain)   Time 4   Period Weeks    Status Partially Met    Target Date 03/29/22      PT LONG TERM GOAL #3   Title Pt will increase L ankle AROM to [facility] as compared to R ankle to improve functional mobility.    Baseline L/R ankle AROM: DF (-5/11 deg.), PF (44/58 deg.), IV (12/30 deg.), EV (15/15 deg.).  6/28: L/R ankle AROM: DF (WNL), PF (WNL), IV (24/36 deg.), EV (8/30 deg.)- pain limited.  Time 4   Period Weeks    Status Partially Met    Target Date 03/29/22      PT LONG TERM GOAL #4   Title Pt. will ambulate with normalized gait pattern and consistent heel strike with no increase c/o L ankle pain without use of ankle support/brace.    Baseline L antalgic gait pattern.  7/10 L ankle pain.  01/26/22: marked improvement in ankle pain but occasional flare ups   Time 4    Period Weeks    Status Partially Met    Target Date 03/29/22      PT LONG TERM GOAL #5   Title Pt. will increase L shoulder strength to grossly 5/5 MMT to improve  pain-free mobility and return to gym.    Baseline Seated L shoulder strength grossly 4/5 MMT (pain with flexion/ abduction).     Time 8    Period Weeks    Status Partially met   Target Date 04/26/22      PT LONG TERM GOAL #6   Title Pt. Able to tolerate 30 minute work-out at [employer] with no L shoulder pain/ limitations.    Baseline Pt. Has not returned to PF at this time.    Time 8    Period Weeks    Status Not met   Target Date 04/26/22          Plan -     Clinical Impression Statement Pt. Will contact PT after pain mgmt f/u to discuss POC with L knee.  Pt. Instructed to return to [employer] if cleared by MD.  Moderate lumbar paraspinal tenderness with light palpation but improve tolerance  with use of Hypervolt  Pt will benefit from future therapeutic intervention of [employer] incorporation, as long as her knee can tolerate the positional changes.     Personal Factors and Comorbidities Comorbidity 3+    Examination-Activity Limitations Bathing;Bed Mobility;Bend;Carry;Dressing;Hygiene/Grooming;Lift;Locomotion Level;Sit;Squat;Stairs;Toileting;Stand    Examination-Participation Restrictions Cleaning;Laundry;Meal Prep;Community Activity    Stability/Clinical Decision Making Evolving/Moderate complexity    Clinical Decision Making Moderate    Rehab Potential Fair    PT Frequency 2x / week    PT Duration 8 weeks    PT Treatment/Interventions ADLs/Self Care Home Management;Aquatic Therapy;Cryotherapy;Iontophoresis 4m/ml Dexamethasone;Patient/family education;Neuromuscular re-education;Balance training;Therapeutic exercise;Therapeutic activities;Functional mobility training;Stair training;Gait training;Manual techniques;Passive range of motion;Dry needling;Electrical Stimulation    PT Next Visit Plan Progress strength training.        PT Home Exercise Plan [id number]    [email or web address] Agree with Plan of Care Patient          MPura Spice PT, DPT # [phone]/26/2023, 2:10 PM  [place] [facility] [place] [address]Phone: [phone]  Fax:  [phone]

## 2022-04-03 ENCOUNTER — Ambulatory Visit: Payer: Medicare Other | Admitting: Physical Therapy

## 2022-04-10 ENCOUNTER — Ambulatory Visit: Payer: Medicare Other | Attending: Orthopedic Surgery | Admitting: Physical Therapy

## 2022-04-10 DIAGNOSIS — M6281 Muscle weakness (generalized): Secondary | ICD-10-CM | POA: Diagnosis present

## 2022-04-10 DIAGNOSIS — M5442 Lumbago with sciatica, left side: Secondary | ICD-10-CM | POA: Insufficient documentation

## 2022-04-10 DIAGNOSIS — M25612 Stiffness of left shoulder, not elsewhere classified: Secondary | ICD-10-CM

## 2022-04-10 DIAGNOSIS — M25572 Pain in left ankle and joints of left foot: Secondary | ICD-10-CM | POA: Diagnosis present

## 2022-04-10 DIAGNOSIS — M25672 Stiffness of left ankle, not elsewhere classified: Secondary | ICD-10-CM

## 2022-04-10 DIAGNOSIS — M5441 Lumbago with sciatica, right side: Secondary | ICD-10-CM

## 2022-04-10 DIAGNOSIS — M2560 Stiffness of unspecified joint, not elsewhere classified: Secondary | ICD-10-CM | POA: Diagnosis present

## 2022-04-10 NOTE — Therapy (Signed)
OUTPATIENT PHYSICAL THERAPY TREATMENT NOTE    Patient Name: Kelsey Bell MRN: [id number] DOB:November 30, 1980, 41 y.o., female Today's Date: 04/10/2022  PCP: Ricardo Jericho, NP REFERRING PROVIDER: Ricardo Jericho, NP    PT End of Session -     Visit Number [phone]   Authorization Type 30   Recert date: 3/81/01   Authorization - Visit Number 6   Authorization - Number of Visits 10    PT Start Time 7510 to 1121   Activity Tolerance Patient limited by pain;Patient tolerated treatment well    Behavior During Therapy [facility] for tasks assessed/performed           Past Medical History:  Diagnosis Date   Allergy    Anxiety    Arthritis    Asthma    Breast pain present for several months   Bil LT >RT across of breasts   CRPS (complex regional pain syndrome type I)    Depression    DVT (deep venous thrombosis) ([place]) 06/2018   Fibromyalgia    Kidney stones    LGSIL on Pap smear of cervix 2007   Migraine    Neuromuscular disorder ([place])    Complex regional pain syndrome and Fibromyalgia   Pancreatic pseudocyst    Pancreatitis    Past Surgical History:  Procedure Laterality Date   COLPOSCOPY  2017   neg bx   ERCP     EXTRACORPOREAL SHOCK WAVE LITHOTRIPSY Right 04/26/2017   Procedure: EXTRACORPOREAL SHOCK WAVE LITHOTRIPSY (ESWL);  Surgeon: Hollice Espy, MD;  Location: [facility] ORS;  Service: Urology;  Laterality: Right;   GASTROSTOMY-JEJEUNOSTOMY TUBE CHANGE/PLACEMENT     KNEE ARTHROSCOPY Right    nj placement     TONSILLECTOMY     UPPER ESOPHAGEAL ENDOSCOPIC ULTRASOUND (EUS)     UPPER GASTROINTESTINAL ENDOSCOPY     Patient Active Problem List   Diagnosis Date Noted   Orthostatic syncope    Dehydration 06/09/2020   Genetic testing 10/24/2018   Idiopathic acute pancreatitis 07/22/2018   DVT (deep venous thrombosis) ([place]) 06/04/2018   Acute pancreatitis 05/27/2018   Right knee pain 05/19/2018   Peripheral edema 03/05/2018   Neuropathic pain 11/02/2017   Kidney  stone 04/25/2017   Ureteral stone    Migraines 12/15/2016   Morbid obesity with BMI of 45.0-49.9, adult ([place]) 10/20/2015   Asthma, moderate 05/21/2015   Polyarthralgia 12/01/2011   Fibromyalgia 09/18/2011   Low back pain 09/18/2011    REFERRING DIAG: Sprain of L ankle/ L ankle pain/ L shoulder pain/ Low back pain  THERAPY DIAG:  Pain in left ankle and joints of left foot  Acute bilateral low back pain with bilateral sciatica  Muscle weakness (generalized)  Ankle joint stiffness, left  Shoulder joint stiffness, left  PERTINENT HISTORY: see evaluation  PRECAUTIONS: N/A  SUBJECTIVE: Pt. Reports knee is feeling better.  Pt. Has crepitus in L knee noted with sit to stands.  Pt. Has not returned to [employer] secondary to gastroparesis issus.  Pt. States she has to force herself to eat.     PAIN:  Are you having pain? Yes  L knee:  no subjective pain score given   TODAY'S TREATMENT:   04/10/22:  There ex: Nustep L4 x10 min. B UE/LE.  Consistent cadence/ no increase c/o pain.    Seated physioball ex.:  2# shoulder flexion/ chest press/ bicep curls 20x each with mirror feedback.  Good core control/ balance on ex. Ball.    Sit to stands from gray  chair with focus on equal wt. Bearing/ knee flexion 5x.  Moderate L knee crepitus noted.    Manual Therapy: Prone STM with use of Hypervolt thoracic/lumbar paraspinals and medial L scapular mm, 10 min (for pain control and muscular dysfunction/tightness in L UT and lumbothoracic paraspinal mm)  Pt education: pt. Encouraged to return to [employer] when she transitions from PT to independent exercise program.      03/29/22:  L knee reassessment (no noted swelling but c/o moderate tenderness with light palpation).  L antalgic gait pattern.  Pt. Has rejoined [employer] but has not attended at this time.   Manual Therapy:  Prone STM with use of Hypervolt thoracic/lumbar paraspinals and medial L scapular mm, 5 min (for  pain control and muscular dysfunction/tightness in L UT and lumbothoracic paraspinal mm)  Supine L/R LE stretches (generalized).  Full L shoulder AROM noted in supine position.      03/22/22: L knee assessment upon arrival: Medial/lateral patellar mobs, pt notes medial knee discomfort with medial mob today; superior/inferior mobs- no pain noted; varus stress test pt notes discomfort today along medial knee; SLS pt notes medial knee pain, held on performing thessaly's after that; pt reluctant to PT performing McMurray's and demonstrated mm guarding   Therapeutic Exercise: UBE: 3 min forward/3 min reverse  Scapular retraction 20 x with blue TB - 2 sets  Horizontal abduction - 20 x - 1 set with green TB Shoulder ER 20 x 1 set with green TB  Blue physioball on table in standing: trunk lean forward/shoulder flex x10, side bend/shoulder abd x Standing shoulder wand shoulder horizontal add/abd x 10 alternating UE Standing shoulder scaption with wand x10 alternating UE   Pt education regarding quadriceps isometrics in long sitting 5 sec hold x10, throughout day, ice  15 min for knee pain for HEP today    PATIENT EDUCATION: Education details: Form/technique with exercise Person educated: Patient Education method: Customer service manager Education comprehension: verbalized understanding and returned demonstration   HOME EXERCISE PROGRAM: [email or web address]     PT Long Term Goals -      PT LONG TERM GOAL #1   Title Pt will increase FOTO score to 56 to improve overall perceived functional ability.    Baseline IE: 27.  1/3: 27 (no change)- pain limited.  3/2: 34 (ankle/foot)- 20th visit.  5/25: unable to reassess.     Time 8    Period Weeks    Status Goal met   Target Date 02/20/22      PT LONG TERM GOAL #2   Title Pt will increase ankle strength by at least 1/2 MMT grade in order to demonstrate improvement in strength and functio    Baseline L ankle strength grossly 3+/5 MMT (pain  limited).  R ankle strength 5/5 MMT.  L ankle remain pain limited.  6/28: L ankle IV/EV limited to 4/5 MMT (marked pain)   Time 4   Period Weeks    Status Partially Met    Target Date 03/29/22      PT LONG TERM GOAL #3   Title Pt will increase L ankle AROM to [facility] as compared to R ankle to improve functional mobility.    Baseline L/R ankle AROM: DF (-5/11 deg.), PF (44/58 deg.), IV (12/30 deg.), EV (15/15 deg.).  6/28: L/R ankle AROM: DF (WNL), PF (WNL), IV (24/36 deg.), EV (8/30 deg.)- pain limited.     Time 4   Period Weeks    Status Partially Met  Target Date 03/29/22      PT LONG TERM GOAL #4   Title Pt. will ambulate with normalized gait pattern and consistent heel strike with no increase c/o L ankle pain without use of ankle support/brace.    Baseline L antalgic gait pattern.  7/10 L ankle pain.  01/26/22: marked improvement in ankle pain but occasional flare ups   Time 4    Period Weeks    Status Partially Met    Target Date 03/29/22      PT LONG TERM GOAL #5   Title Pt. will increase L shoulder strength to grossly 5/5 MMT to improve  pain-free mobility and return to gym.    Baseline Seated L shoulder strength grossly 4/5 MMT (pain with flexion/ abduction).     Time 8    Period Weeks    Status Partially met   Target Date 04/26/22      PT LONG TERM GOAL #6   Title Pt. Able to tolerate 30 minute work-out at [employer] with no L shoulder pain/ limitations.    Baseline Pt. Has not returned to PF at this time.    Time 8    Period Weeks    Status Not met   Target Date 04/26/22          Plan -     Clinical Impression Statement Pt. Instructed to return to [employer] to promote increase strengthening/ muscle endurance.  Moderate lumbar paraspinal tenderness with light palpation but improve tolerance with use of Hypervolt   Good upright posture/ core muscle activation with seated blue ball ex.  Pt will benefit from future therapeutic intervention of Sci[employer]  incorporation, as long as her knee can tolerate the positional changes.     Personal Factors and Comorbidities Comorbidity 3+    Examination-Activity Limitations Bathing;Bed Mobility;Bend;Carry;Dressing;Hygiene/Grooming;Lift;Locomotion Level;Sit;Squat;Stairs;Toileting;Stand    Examination-Participation Restrictions Cleaning;Laundry;Meal Prep;Community Activity    Stability/Clinical Decision Making Evolving/Moderate complexity    Clinical Decision Making Moderate    Rehab Potential Fair    PT Frequency 2x / week    PT Duration 8 weeks    PT Treatment/Interventions ADLs/Self Care Home Management;Aquatic Therapy;Cryotherapy;Iontophoresis 59m/ml Dexamethasone;Patient/family education;Neuromuscular re-education;Balance training;Therapeutic exercise;Therapeutic activities;Functional mobility training;Stair training;Gait training;Manual techniques;Passive range of motion;Dry needling;Electrical Stimulation    PT Next Visit Plan Progress strength training.        PT Home Exercise Plan [email or web address]    [id number] Agree with Plan of Care Patient          MPura Spice PT, DPT # [phone]/03/2022, 1:11 PM  [place] [facility] [place] [address]Phone: [phone]  Fax:  [phone]

## 2022-04-12 ENCOUNTER — Ambulatory Visit: Payer: Medicare Other | Admitting: Physical Therapy

## 2022-04-12 ENCOUNTER — Encounter: Payer: Self-pay | Admitting: Physical Therapy

## 2022-04-12 DIAGNOSIS — M25572 Pain in left ankle and joints of left foot: Secondary | ICD-10-CM

## 2022-04-12 DIAGNOSIS — M5441 Lumbago with sciatica, right side: Secondary | ICD-10-CM

## 2022-04-12 DIAGNOSIS — M2560 Stiffness of unspecified joint, not elsewhere classified: Secondary | ICD-10-CM

## 2022-04-12 DIAGNOSIS — M25612 Stiffness of left shoulder, not elsewhere classified: Secondary | ICD-10-CM

## 2022-04-12 DIAGNOSIS — M25672 Stiffness of left ankle, not elsewhere classified: Secondary | ICD-10-CM

## 2022-04-12 DIAGNOSIS — M6281 Muscle weakness (generalized): Secondary | ICD-10-CM

## 2022-04-12 NOTE — Therapy (Signed)
OUTPATIENT PHYSICAL THERAPY TREATMENT NOTE  Patient Name: Kelsey Bell MRN: [id number] DOB:04-26-1981, 41 y.o., female Today's Date: 04/12/2022  PCP: Ricardo Jericho, NP REFERRING PROVIDER: Ricardo Jericho, NP    PT End of Session -     Visit Number [phone]   Authorization Type [id number]   Recert date: 2/83/15   Authorization - Visit Number 7   Authorization - Number of Visits 10    PT Start Time 1761 to 1357  (61 minutes)   Activity Tolerance Patient limited by pain;Patient tolerated treatment well    Behavior During Therapy [facility] for tasks assessed/performed           Past Medical History:  Diagnosis Date   Allergy    Anxiety    Arthritis    Asthma    Breast pain present for several months   Bil LT >RT across of breasts   CRPS (complex regional pain syndrome type I)    Depression    DVT (deep venous thrombosis) ([place]) 06/2018   Fibromyalgia    Kidney stones    LGSIL on Pap smear of cervix 2007   Migraine    Neuromuscular disorder ([place])    Complex regional pain syndrome and Fibromyalgia   Pancreatic pseudocyst    Pancreatitis    Past Surgical History:  Procedure Laterality Date   COLPOSCOPY  2017   neg bx   ERCP     EXTRACORPOREAL SHOCK WAVE LITHOTRIPSY Right 04/26/2017   Procedure: EXTRACORPOREAL SHOCK WAVE LITHOTRIPSY (ESWL);  Surgeon: Hollice Espy, MD;  Location: [facility] ORS;  Service: Urology;  Laterality: Right;   GASTROSTOMY-JEJEUNOSTOMY TUBE CHANGE/PLACEMENT     KNEE ARTHROSCOPY Right    nj placement     TONSILLECTOMY     UPPER ESOPHAGEAL ENDOSCOPIC ULTRASOUND (EUS)     UPPER GASTROINTESTINAL ENDOSCOPY     Patient Active Problem List   Diagnosis Date Noted   Orthostatic syncope    Dehydration 06/09/2020   Genetic testing 10/24/2018   Idiopathic acute pancreatitis 07/22/2018   DVT (deep venous thrombosis) ([place]) 06/04/2018   Acute pancreatitis 05/27/2018   Right knee pain 05/19/2018   Peripheral edema 03/05/2018   Neuropathic pain 11/02/2017    Kidney stone 04/25/2017   Ureteral stone    Migraines 12/15/2016   Morbid obesity with BMI of 45.0-49.9, adult ([place]) 10/20/2015   Asthma, moderate 05/21/2015   Polyarthralgia 12/01/2011   Fibromyalgia 09/18/2011   Low back pain 09/18/2011    REFERRING DIAG: Sprain of L ankle/ L ankle pain/ L shoulder pain/ Low back pain  THERAPY DIAG:  Pain in left ankle and joints of left foot  Acute bilateral low back pain with bilateral sciatica  Muscle weakness (generalized)  Ankle joint stiffness, left  Shoulder joint stiffness, left  Joint stiffness  PERTINENT HISTORY: see evaluation  PRECAUTIONS: N/A  SUBJECTIVE:  Pt. Reports she lost another size in her waist so her pants are not fitting.  Pts. Blood sugar  Is 129 prior to tx. Session.    PAIN:  Are you having pain? Yes in L shoulder and ankle  TODAY'S TREATMENT:   04/12/22:  There ex:  Nustep L5 x10 min. B UE/LE.  Consistent cadence/ no increase c/o pain.    Seated physioball ex.:  GTB scap. Retraction/ shoulder extension/ horizontal abduction/ B external rotation 20x each. With mirror feedback.  Attempted alt. UE/LE core ex. But limited by coordination limitations.    [employer]: seated 40# lat. Pull downs/ standing 30# tricep extension 20x  Manual Therapy:  Seated/ prone STM to B UT and mid-scapular musculature with use of Hypervolt for pain control and muscular dysfunction/tightness (10 minutes).  MH to mid-low back during manual tx.    Pt education: pt. Encouraged to return to [employer] when she transitions from PT to independent exercise program.       PATIENT EDUCATION: Education details: Form/technique with exercise Person educated: Patient Education method: Customer service manager Education comprehension: verbalized understanding and returned demonstration   HOME EXERCISE PROGRAM: [place] #1   Title Pt will increase FOTO score to 56 to  improve overall perceived functional ability.    Baseline IE: 27.  1/3: 27 (no change)- pain limited.  3/2: 46 (ankle/foot)- 20th visit.  5/25: unable to reassess.     Time 8    Period Weeks    Status Goal met   Target Date 02/20/22      PT LONG TERM GOAL #2   Title Pt will increase ankle strength by at least 1/2 MMT grade in order to demonstrate improvement in strength and functio    Baseline L ankle strength grossly 3+/5 MMT (pain limited).  R ankle strength 5/5 MMT.  L ankle remain pain limited.  6/28: L ankle IV/EV limited to 4/5 MMT (marked pain)   Time 4   Period Weeks    Status Partially Met    Target Date 03/29/22      PT LONG TERM GOAL #3   Title Pt will increase L ankle AROM to [facility] as compared to R ankle to improve functional mobility.    Baseline L/R ankle AROM: DF (-5/11 deg.), PF (44/58 deg.), IV (12/30 deg.), EV (15/15 deg.).  6/28: L/R ankle AROM: DF (WNL), PF (WNL), IV (24/36 deg.), EV (8/30 deg.)- pain limited.     Time 4   Period Weeks    Status Partially Met    Target Date 03/29/22      PT LONG TERM GOAL #4   Title Pt. will ambulate with normalized gait pattern and consistent heel strike with no increase c/o L ankle pain without use of ankle support/brace.    Baseline L antalgic gait pattern.  7/10 L ankle pain.  01/26/22: marked improvement in ankle pain but occasional flare ups   Time 4    Period Weeks    Status Partially Met    Target Date 03/29/22      PT LONG TERM GOAL #5   Title Pt. will increase L shoulder strength to grossly 5/5 MMT to improve  pain-free mobility and return to gym.    Baseline Seated L shoulder strength grossly 4/5 MMT (pain with flexion/ abduction).     Time 8    Period Weeks    Status Partially met   Target Date 04/26/22      PT LONG TERM GOAL #6   Title Pt. Able to tolerate 30 minute work-out at [employer] with no L shoulder pain/ limitations.    Baseline Pt. Has not returned to PF at this time.    Time 8    Period Weeks     Status Not met   Target Date 04/26/22          Plan -     Clinical Impression Statement Pt. Instructed to return to [employer] to promote increase strengthening/ muscle endurance.  Improvement in R lumbar tenderness noted today during manual  tx.  Good upright posture/ core muscle activation with seated blue ball ex.  Poor coordination with alt. UE/LE marching with blue ball and in chair.  Pt will benefit from generalized UE/LE muscle strengthening and pain mgmt. to improve pain-free mobility.     Personal Factors and Comorbidities Comorbidity 3+    Examination-Activity Limitations Bathing;Bed Mobility;Bend;Carry;Dressing;Hygiene/Grooming;Lift;Locomotion Level;Sit;Squat;Stairs;Toileting;Stand    Examination-Participation Restrictions Cleaning;Laundry;Meal Prep;Community Activity    Stability/Clinical Decision Making Evolving/Moderate complexity    Clinical Decision Making Moderate    Rehab Potential Fair    PT Frequency 2x / week    PT Duration 8 weeks    PT Treatment/Interventions ADLs/Self Care Home Management;Aquatic Therapy;Cryotherapy;Iontophoresis 64m/ml Dexamethasone;Patient/family education;Neuromuscular re-education;Balance training;Therapeutic exercise;Therapeutic activities;Functional mobility training;Stair training;Gait training;Manual techniques;Passive range of motion;Dry needling;Electrical Stimulation    PT Next Visit Plan Progress strength training.        PT Home Exercise Plan [id number]    [email or web address] Agree with Plan of Care Patient          MPura Spice PT, DPT # [phone]/05/2022, 1:58 PM  [facility] CenterMR[facility] [place] [address]Phone: [phone]  Fax:  [phone]

## 2022-04-17 ENCOUNTER — Ambulatory Visit: Payer: Medicare Other | Admitting: Physical Therapy

## 2022-04-20 ENCOUNTER — Ambulatory Visit: Payer: Medicare Other | Admitting: Physical Therapy

## 2022-04-24 ENCOUNTER — Ambulatory Visit: Payer: Medicare Other | Admitting: Physical Therapy

## 2022-04-26 ENCOUNTER — Ambulatory Visit: Payer: Medicare Other | Admitting: Physical Therapy

## 2022-05-01 ENCOUNTER — Ambulatory Visit: Payer: Medicare Other | Admitting: Physical Therapy

## 2022-05-01 DIAGNOSIS — M25572 Pain in left ankle and joints of left foot: Secondary | ICD-10-CM

## 2022-05-01 DIAGNOSIS — M2560 Stiffness of unspecified joint, not elsewhere classified: Secondary | ICD-10-CM

## 2022-05-01 DIAGNOSIS — M25612 Stiffness of left shoulder, not elsewhere classified: Secondary | ICD-10-CM

## 2022-05-01 DIAGNOSIS — M6281 Muscle weakness (generalized): Secondary | ICD-10-CM

## 2022-05-01 DIAGNOSIS — M5441 Lumbago with sciatica, right side: Secondary | ICD-10-CM

## 2022-05-01 DIAGNOSIS — M25672 Stiffness of left ankle, not elsewhere classified: Secondary | ICD-10-CM

## 2022-05-03 ENCOUNTER — Ambulatory Visit: Payer: Medicare Other | Admitting: Physical Therapy

## 2022-05-03 DIAGNOSIS — M25572 Pain in left ankle and joints of left foot: Secondary | ICD-10-CM | POA: Diagnosis not present

## 2022-05-03 DIAGNOSIS — M25672 Stiffness of left ankle, not elsewhere classified: Secondary | ICD-10-CM

## 2022-05-03 DIAGNOSIS — M5441 Lumbago with sciatica, right side: Secondary | ICD-10-CM

## 2022-05-03 DIAGNOSIS — M2560 Stiffness of unspecified joint, not elsewhere classified: Secondary | ICD-10-CM

## 2022-05-03 DIAGNOSIS — M25612 Stiffness of left shoulder, not elsewhere classified: Secondary | ICD-10-CM

## 2022-05-03 DIAGNOSIS — M6281 Muscle weakness (generalized): Secondary | ICD-10-CM

## 2022-05-04 NOTE — Therapy (Signed)
OUTPATIENT PHYSICAL THERAPY TREATMENT NOTE/ RECERTIFICATION  Patient Name: Kelsey Bell MRN: [id number] DOB:Jan 30, 1981, 41 y.o., female Today's Date: 05/01/22  PCP: Ricardo Jericho, NP REFERRING PROVIDER: Ricardo Jericho, NP    PT End of Session -     Visit Number 66   Authorization Type 70   Recert date: 81/15/72   Authorization - Visit Number 8   Authorization - Number of Visits 10    PT Start Time 1022 to 1121  (59 minutes)   Activity Tolerance Patient limited by pain;Patient tolerated treatment well    Behavior During Therapy [facility] for tasks assessed/performed           Past Medical History:  Diagnosis Date   Allergy    Anxiety    Arthritis    Asthma    Breast pain present for several months   Bil LT >RT across of breasts   CRPS (complex regional pain syndrome type I)    Depression    DVT (deep venous thrombosis) ([place]) 06/2018   Fibromyalgia    Kidney stones    LGSIL on Pap smear of cervix 2007   Migraine    Neuromuscular disorder ([place])    Complex regional pain syndrome and Fibromyalgia   Pancreatic pseudocyst    Pancreatitis    Past Surgical History:  Procedure Laterality Date   COLPOSCOPY  2017   neg bx   ERCP     EXTRACORPOREAL SHOCK WAVE LITHOTRIPSY Right 04/26/2017   Procedure: EXTRACORPOREAL SHOCK WAVE LITHOTRIPSY (ESWL);  Surgeon: Hollice Espy, MD;  Location: [facility] ORS;  Service: Urology;  Laterality: Right;   GASTROSTOMY-JEJEUNOSTOMY TUBE CHANGE/PLACEMENT     KNEE ARTHROSCOPY Right    nj placement     TONSILLECTOMY     UPPER ESOPHAGEAL ENDOSCOPIC ULTRASOUND (EUS)     UPPER GASTROINTESTINAL ENDOSCOPY     Patient Active Problem List   Diagnosis Date Noted   Orthostatic syncope    Dehydration 06/09/2020   Genetic testing 10/24/2018   Idiopathic acute pancreatitis 07/22/2018   DVT (deep venous thrombosis) ([place]) 06/04/2018   Acute pancreatitis 05/27/2018   Right knee pain 05/19/2018   Peripheral edema 03/05/2018    Neuropathic pain 11/02/2017   Kidney stone 04/25/2017   Ureteral stone    Migraines 12/15/2016   Morbid obesity with BMI of 45.0-49.9, adult ([place]) 10/20/2015   Asthma, moderate 05/21/2015   Polyarthralgia 12/01/2011   Fibromyalgia 09/18/2011   Low back pain 09/18/2011    REFERRING DIAG: Sprain of L ankle/ L ankle pain/ L shoulder pain/ Low back pain  THERAPY DIAG:  Pain in left ankle and joints of left foot  Acute bilateral low back pain with bilateral sciatica  Muscle weakness (generalized)  Ankle joint stiffness, left  Shoulder joint stiffness, left  Joint stiffness  PERTINENT HISTORY: see evaluation  PRECAUTIONS: N/A  SUBJECTIVE:  05/01/22:  Pt. States she cancelled last several PT appts. Secondary to migraine.  Pt. Had Botox for migraines last week and is doing better.  Pt. Unable to participate with [employer] secondary to migraines.      PAIN:  Are you having pain? Yes in L shoulder and ankle  (no subjective pain score).    TODAY'S TREATMENT:   05/01/22:  There ex:  Nustep L4-5 x10 min. B UE/LE.  Consistent cadence/ no increase c/o pain.    Seated physioball ex.:  GTB scap. Retraction/ shoulder extension/ horizontal abduction/ B external rotation 20x each. With mirror feedback.  Attempted alt. UE/LE core ex. But  limited by coordination limitations.    [employer]: seated 40# lat. Pull downs/ standing 30# tricep extension 20x   Standing partial lunges on L/R LE/ BOSU.  Focus on ankle control.    Discussed [employer]  Manual Therapy:  Seated/ prone STM to B UT/scapular/ posterior deltoid/ lumbar musculature with use of Hypervolt for pain control and muscular dysfunction/tightness (15 minutes).  MH to mid-low back during manual tx.    Pt education: pt. Encouraged to return to [employer] when she transitions from PT to independent exercise program.       PATIENT EDUCATION: Education details: Form/technique with exercise Person educated:  Patient Education method: Customer service manager Education comprehension: verbalized understanding and returned demonstration   HOME EXERCISE PROGRAM: [place] #1   Title Pt will increase FOTO score to 56 to improve overall perceived functional ability.    Baseline IE: 27.  1/3: 27 (no change)- pain limited.  3/2: 84 (ankle/foot)- 20th visit.  5/25: unable to reassess.     Time 8    Period Weeks    Status Goal met   Target Date 02/20/22      PT LONG TERM GOAL #2   Title Pt will increase ankle strength by at least 1/2 MMT grade in order to demonstrate improvement in strength and functio    Baseline L ankle strength grossly 3+/5 MMT (pain limited).  R ankle strength 5/5 MMT.  L ankle remain pain limited.  6/28: L ankle IV/EV limited to 4/5 MMT (marked pain).     Time 4   Period Weeks    Status Partially Met    Target Date 06/26/22      PT LONG TERM GOAL #3   Title Pt will increase L ankle AROM to [facility] as compared to R ankle to improve functional mobility.    Baseline L/R ankle AROM: DF (-5/11 deg.), PF (44/58 deg.), IV (12/30 deg.), EV (15/15 deg.).  6/28: L/R ankle AROM: DF (WNL), PF (WNL), IV (24/36 deg.), EV (8/30 deg.)- pain limited.     Time 4   Period Weeks    Status Partially Met    Target Date 06/26/22      PT LONG TERM GOAL #4   Title Pt. will ambulate with normalized gait pattern and consistent heel strike with no increase c/o L ankle pain without use of ankle support/brace.    Baseline L antalgic gait pattern.  7/10 L ankle pain.  01/26/22: marked improvement in ankle pain but occasional flare ups   Time 4    Period Weeks    Status Partially Met    Target Date 06/26/22      PT LONG TERM GOAL #5   Title Pt. will increase L shoulder strength to grossly 5/5 MMT to improve  pain-free mobility and return to gym.    Baseline Seated L shoulder strength grossly 4/5 MMT (pain with flexion/ abduction).     Time 8     Period Weeks    Status Partially met   Target Date 06/26/22      PT LONG TERM GOAL #6   Title Pt. Able to tolerate 30 minute work-out at [employer] with no L shoulder pain/ limitations.    Baseline Pt. Has not returned to PF at this time.  8/28: pt. Has joined PF but not attended.   Time 8    Period Weeks  Status Not met   Target Date 06/26/22          Plan -     Clinical Impression Statement PT reviewed goals/ current functional mobility with daily tasks.  Pt. Instructed to return to [employer] to promote increase strengthening/ muscle endurance.  Improvement in R lumbar tenderness noted today during manual tx.  Good upright posture/ core muscle activation with seated blue ball ex.  Poor coordination with alt. UE/LE marching with blue ball and in chair.  Pain limited with L shoulder/ ankle palpation and progression in there.ex.   Pt will benefit from generalized UE/LE muscle strengthening and pain mgmt. to improve pain-free mobility.     Personal Factors and Comorbidities Comorbidity 3+    Examination-Activity Limitations Bathing;Bed Mobility;Bend;Carry;Dressing;Hygiene/Grooming;Lift;Locomotion Level;Sit;Squat;Stairs;Toileting;Stand    Examination-Participation Restrictions Cleaning;Laundry;Meal Prep;Community Activity    Stability/Clinical Decision Making Evolving/Moderate complexity    Clinical Decision Making Moderate    Rehab Potential Fair    PT Frequency 2x / week    PT Duration 8 weeks    PT Treatment/Interventions ADLs/Self Care Home Management;Aquatic Therapy;Cryotherapy;Iontophoresis 41m/ml Dexamethasone;Patient/family education;Neuromuscular re-education;Balance training;Therapeutic exercise;Therapeutic activities;Functional mobility training;Stair training;Gait training;Manual techniques;Passive range of motion;Dry needling;Electrical Stimulation    PT Next Visit Plan Progress strength training.        PT Home Exercise Plan [id number]    [email or web address] Agree with Plan  of Care Patient          MPura Spice PT, DPT # [phone]/31/2023, 6:16 PM  [place] [facility] [place] [address]Phone: [phone]  Fax:  [phone]

## 2022-05-04 NOTE — Therapy (Signed)
OUTPATIENT PHYSICAL THERAPY TREATMENT NOTE/  Patient Name: Kelsey Bell MRN: [id number] DOB:1981-02-26, 41 y.o., female Today's Date: 05/03/22  PCP: Ricardo Jericho, NP REFERRING PROVIDER: Ricardo Jericho, NP    PT End of Session -     Visit Number [phone]   Authorization Type 38   Recert date: 62/69/48   Authorization - Visit Number 9   Authorization - Number of Visits 10    PT Start Time 5462 to 7035  (54 minutes)   Activity Tolerance Patient limited by pain;Patient tolerated treatment well    Behavior During Therapy [facility] for tasks assessed/performed           Past Medical History:  Diagnosis Date   Allergy    Anxiety    Arthritis    Asthma    Breast pain present for several months   Bil LT >RT across of breasts   CRPS (complex regional pain syndrome type I)    Depression    DVT (deep venous thrombosis) ([place]) 06/2018   Fibromyalgia    Kidney stones    LGSIL on Pap smear of cervix 2007   Migraine    Neuromuscular disorder ([place])    Complex regional pain syndrome and Fibromyalgia   Pancreatic pseudocyst    Pancreatitis    Past Surgical History:  Procedure Laterality Date   COLPOSCOPY  2017   neg bx   ERCP     EXTRACORPOREAL SHOCK WAVE LITHOTRIPSY Right 04/26/2017   Procedure: EXTRACORPOREAL SHOCK WAVE LITHOTRIPSY (ESWL);  Surgeon: Hollice Espy, MD;  Location: [facility] ORS;  Service: Urology;  Laterality: Right;   GASTROSTOMY-JEJEUNOSTOMY TUBE CHANGE/PLACEMENT     KNEE ARTHROSCOPY Right    nj placement     TONSILLECTOMY     UPPER ESOPHAGEAL ENDOSCOPIC ULTRASOUND (EUS)     UPPER GASTROINTESTINAL ENDOSCOPY     Patient Active Problem List   Diagnosis Date Noted   Orthostatic syncope    Dehydration 06/09/2020   Genetic testing 10/24/2018   Idiopathic acute pancreatitis 07/22/2018   DVT (deep venous thrombosis) ([place]) 06/04/2018   Acute pancreatitis 05/27/2018   Right knee pain 05/19/2018   Peripheral edema 03/05/2018   Neuropathic pain  11/02/2017   Kidney stone 04/25/2017   Ureteral stone    Migraines 12/15/2016   Morbid obesity with BMI of 45.0-49.9, adult ([place]) 10/20/2015   Asthma, moderate 05/21/2015   Polyarthralgia 12/01/2011   Fibromyalgia 09/18/2011   Low back pain 09/18/2011    REFERRING DIAG: Sprain of L ankle/ L ankle pain/ L shoulder pain/ Low back pain  THERAPY DIAG:  Pain in left ankle and joints of left foot  Acute bilateral low back pain with bilateral sciatica  Muscle weakness (generalized)  Ankle joint stiffness, left  Shoulder joint stiffness, left  Joint stiffness  PERTINENT HISTORY: see evaluation  PRECAUTIONS: N/A  SUBJECTIVE:  05/03/22:  Pt. 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  Pt. Reports blood sugar/ A1C have been much better recently as compared to last year.  Pt. States L shoulder/ ankle have been hurting more recently.     PAIN:  Are you having pain? Yes in L shoulder and ankle  (no subjective pain score).    TODAY'S TREATMENT:   05/03/22:  There ex:  Nustep L5 B UE/LE for 10 minutes.  Consistent cadence/ no increase c/o pain.    Seated physioball ex. At [employer]:  BTB B shoulder external rotation/ 50# lat. Pull downs/ 30# tricep extension/ 4# dumbbells shoulder  press ups and punchs 20x each.     Walking in PT clinic with gait reassessment  Discussed [employer]/ HEP  Manual Therapy:  Prone STM to B UT/scapular/ posterior deltoid/ lumbar musculature with use of Hypervolt for pain control and muscular dysfunction/tightness (15 minutes).  MH to mid-low back during manual tx.    Pt education: pt. Encouraged to return to [employer] when she transitions from PT to independent exercise program.       PATIENT EDUCATION: Education details: Form/technique with exercise Person educated: Patient Education method: Customer service manager Education comprehension: verbalized understanding and returned demonstration   HOME  EXERCISE PROGRAM: [place] #1   Title Pt will increase FOTO score to 56 to improve overall perceived functional ability.    Baseline IE: 27.  1/3: 27 (no change)- pain limited.  3/2: 43 (ankle/foot)- 20th visit.  5/25: unable to reassess.     Time 8    Period Weeks    Status Goal met   Target Date 02/20/22      PT LONG TERM GOAL #2   Title Pt will increase ankle strength by at least 1/2 MMT grade in order to demonstrate improvement in strength and functio    Baseline L ankle strength grossly 3+/5 MMT (pain limited).  R ankle strength 5/5 MMT.  L ankle remain pain limited.  6/28: L ankle IV/EV limited to 4/5 MMT (marked pain).     Time 4   Period Weeks    Status Partially Met    Target Date 06/26/22      PT LONG TERM GOAL #3   Title Pt will increase L ankle AROM to [facility] as compared to R ankle to improve functional mobility.    Baseline L/R ankle AROM: DF (-5/11 deg.), PF (44/58 deg.), IV (12/30 deg.), EV (15/15 deg.).  6/28: L/R ankle AROM: DF (WNL), PF (WNL), IV (24/36 deg.), EV (8/30 deg.)- pain limited.     Time 4   Period Weeks    Status Partially Met    Target Date 06/26/22      PT LONG TERM GOAL #4   Title Pt. will ambulate with normalized gait pattern and consistent heel strike with no increase c/o L ankle pain without use of ankle support/brace.    Baseline L antalgic gait pattern.  7/10 L ankle pain.  01/26/22: marked improvement in ankle pain but occasional flare ups   Time 4    Period Weeks    Status Partially Met    Target Date 06/26/22      PT LONG TERM GOAL #5   Title Pt. will increase L shoulder strength to grossly 5/5 MMT to improve  pain-free mobility and return to gym.    Baseline Seated L shoulder strength grossly 4/5 MMT (pain with flexion/ abduction).     Time 8    Period Weeks    Status Partially met   Target Date 06/26/22      PT LONG TERM GOAL #6   Title Pt. Able to tolerate 30 minute work-out at  [employer] with no L shoulder pain/ limitations.    Baseline Pt. Has not returned to PF at this time.  8/28: pt. Has joined PF but not attended.   Time 8    Period Weeks    Status Not met   Target Date 06/26/22  Plan -     Clinical Impression Statement Pt. Instructed to return to [employer] to promote increase strengthening/ muscle endurance.  Improvement in R lumbar tenderness noted today during manual tx.  Good upright posture/ core muscle activation with seated blue ball ex.  Pt. Has progressed well with UE ex. On blue physioball.  Pain limited with L shoulder/ ankle palpation and progression in there.ex.   Pt will benefit from generalized UE/LE muscle strengthening and pain mgmt. to improve pain-free mobility.     Personal Factors and Comorbidities Comorbidity 3+    Examination-Activity Limitations Bathing;Bed Mobility;Bend;Carry;Dressing;Hygiene/Grooming;Lift;Locomotion Level;Sit;Squat;Stairs;Toileting;Stand    Examination-Participation Restrictions Cleaning;Laundry;Meal Prep;Community Activity    Stability/Clinical Decision Making Evolving/Moderate complexity    Clinical Decision Making Moderate    Rehab Potential Fair    PT Frequency 2x / week    PT Duration 8 weeks    PT Treatment/Interventions ADLs/Self Care Home Management;Aquatic Therapy;Cryotherapy;Iontophoresis 88m/ml Dexamethasone;Patient/family education;Neuromuscular re-education;Balance training;Therapeutic exercise;Therapeutic activities;Functional mobility training;Stair training;Gait training;Manual techniques;Passive range of motion;Dry needling;Electrical Stimulation    PT Next Visit Plan Progress strength training.        PT Home Exercise Plan [id number]    [id number] Agree with Plan of Care Patient          MPura Spice PT, DPT # [phone]/31/2023, 6:55 PM  [place] [facility]Christ[facility]MP[facility] [place] [address]Phone: [phone]  Fax:   [phone]

## 2022-05-12 ENCOUNTER — Ambulatory Visit: Payer: Medicare Other | Admitting: Physical Therapy

## 2022-05-19 ENCOUNTER — Encounter: Payer: Self-pay | Admitting: Physical Therapy

## 2022-05-19 ENCOUNTER — Ambulatory Visit: Payer: Medicare Other | Attending: Orthopedic Surgery | Admitting: Physical Therapy

## 2022-05-19 DIAGNOSIS — M25612 Stiffness of left shoulder, not elsewhere classified: Secondary | ICD-10-CM | POA: Insufficient documentation

## 2022-05-19 DIAGNOSIS — M2560 Stiffness of unspecified joint, not elsewhere classified: Secondary | ICD-10-CM | POA: Insufficient documentation

## 2022-05-19 DIAGNOSIS — M25572 Pain in left ankle and joints of left foot: Secondary | ICD-10-CM | POA: Insufficient documentation

## 2022-05-19 DIAGNOSIS — M5442 Lumbago with sciatica, left side: Secondary | ICD-10-CM | POA: Diagnosis present

## 2022-05-19 DIAGNOSIS — M6281 Muscle weakness (generalized): Secondary | ICD-10-CM | POA: Diagnosis present

## 2022-05-19 DIAGNOSIS — M5441 Lumbago with sciatica, right side: Secondary | ICD-10-CM | POA: Diagnosis present

## 2022-05-19 DIAGNOSIS — M25672 Stiffness of left ankle, not elsewhere classified: Secondary | ICD-10-CM | POA: Insufficient documentation

## 2022-05-19 NOTE — Therapy (Signed)
OUTPATIENT PHYSICAL THERAPY TREATMENT NOTE Physical Therapy Progress Note   Dates of reporting period  03/01/22  to  05/19/22   Patient Name: Kelsey Bell MRN: [id number] DOB:06/26/1981, 41 y.o., female Today's Date: 05/19/22  PCP: White, Elizabeth Burney, NP REFERRING PROVIDER: White, Elizabeth Burney, NP    PT End of Session -     Visit Number 50   Authorization Type [id number]   Recert date: 06/26/22   Authorization - Visit Number 10   Authorization - Number of Visits 10    PT Start Time 1249 to 1348  (59 minutes)   Activity Tolerance Patient limited by pain;Patient tolerated treatment well    Behavior During Therapy WFL for tasks assessed/performed           Past Medical History:  Diagnosis Date   Allergy    Anxiety    Arthritis    Asthma    Breast pain present for several months   Bil LT >RT across of breasts   CRPS (complex regional pain syndrome type I)    Depression    DVT (deep venous thrombosis) (HCC) 06/2018   Fibromyalgia    Kidney stones    LGSIL on Pap smear of cervix 2007   Migraine    Neuromuscular disorder (HCC)    Complex regional pain syndrome and Fibromyalgia   Pancreatic pseudocyst    Pancreatitis    Past Surgical History:  Procedure Laterality Date   COLPOSCOPY  2017   neg bx   ERCP     EXTRACORPOREAL SHOCK WAVE LITHOTRIPSY Right 04/26/2017   Procedure: EXTRACORPOREAL SHOCK WAVE LITHOTRIPSY (ESWL);  Surgeon: Brandon, Ashley, MD;  Location: [facility] ORS;  Service: Urology;  Laterality: Right;   GASTROSTOMY-JEJEUNOSTOMY TUBE CHANGE/PLACEMENT     KNEE ARTHROSCOPY Right    nj placement     TONSILLECTOMY     UPPER ESOPHAGEAL ENDOSCOPIC ULTRASOUND (EUS)     UPPER GASTROINTESTINAL ENDOSCOPY     Patient Active Problem List   Diagnosis Date Noted   Orthostatic syncope    Dehydration 06/09/2020   Genetic testing 10/24/2018   Idiopathic acute pancreatitis 07/22/2018   DVT (deep venous thrombosis) (HCC) 06/04/2018   Acute pancreatitis 05/27/2018    Right knee pain 05/19/2018   Peripheral edema 03/05/2018   Neuropathic pain 11/02/2017   Kidney stone 04/25/2017   Ureteral stone    Migraines 12/15/2016   Morbid obesity with BMI of 45.0-49.9, adult (HCC) 10/20/2015   Asthma, moderate 05/21/2015   Polyarthralgia 12/01/2011   Fibromyalgia 09/18/2011   Low back pain 09/18/2011    REFERRING DIAG: Sprain of L ankle/ L ankle pain/ L shoulder pain/ Low back pain  THERAPY DIAG:  Pain in left ankle and joints of left foot  Acute bilateral low back pain with bilateral sciatica  Muscle weakness (generalized)  Ankle joint stiffness, left  Shoulder joint stiffness, left  Joint stiffness  PERTINENT HISTORY: see evaluation  PRECAUTIONS: N/A  SUBJECTIVE:  05/19/22:  Pt. Has been sick over past week due to a virus mimicking the flu.  Pt. States her Covid tests have been negative.  Pt. Has been resting and staying hydrated.  Pt. States L shoulder and hip have been hurting more recently.     PAIN:  Are you having pain? Yes in L shoulder and ankle  (no subjective pain score).    TODAY'S TREATMENT:   05/19/22:  There ex:  Nustep L5 B UE/LE for 10+ minutes.  Consistent cadence/ no increase c/o pain.  0.5 miles    Walking in //-bars (high marching/ lateral walking)- 3 laps.  R hip discomfort/ "popping" noted.  Pt. Unable to complete walking lunges with to L knee pain without UE assist.    Standing heel raises 20x.  Step ups/ downs 10x2 (light UE assist for safety/ deweight R LE).    Seated physioball ex. At [employer]:  50# lat. Pull downs/ 30# tricep extension/ 4# dumbbells: bicep curls, shoulder press ups and punches 20x each.     Reassessment of ankle stability (tandem/ SLS).  See updated goals.    Manual Therapy:  Prone STM to B UT/scapular/ posterior deltoid/ lumbar musculature with use of [employer]volt for pain control and muscular dysfunction/tightness (15 minutes).  No MH today.    Pt education: pt. Encouraged to return to [employer] when she transitions from PT to independent exercise program.       PATIENT EDUCATION: Education details: Form/technique with exercise Person educated: Patient Education method: Customer service manager Education comprehension: verbalized understanding and returned demonstration   HOME EXERCISE PROGRAM: [place] #1   Title Pt will increase FOTO score to 56 to improve overall perceived functional ability.    Baseline IE: 27.  1/3: 27 (no change)- pain limited.  3/2: 67 (ankle/foot)- 20th visit.  5/25: unable to reassess.     Time 8    Period Weeks    Status Goal met   Target Date 02/20/22      PT LONG TERM GOAL #2   Title Pt will increase ankle strength by at least 1/2 MMT grade in order to demonstrate improvement in strength and functio    Baseline L ankle strength grossly 3+/5 MMT (pain limited).  R ankle strength 5/5 MMT.  L ankle remain pain limited.  6/28: L ankle IV/EV limited to 4/5 MMT (marked pain).     Time 4   Period Weeks    Status Partially Met    Target Date 06/26/22      PT LONG TERM GOAL #3   Title Pt will increase L ankle AROM to [facility] as compared to R ankle to improve functional mobility.    Baseline L/R ankle AROM: DF (-5/11 deg.), PF (44/58 deg.), IV (12/30 deg.), EV (15/15 deg.).  6/28: L/R ankle AROM: DF (WNL), PF (WNL), IV (24/36 deg.), EV (8/30 deg.)- pain limited.     Time 4   Period Weeks    Status Partially Met    Target Date 06/26/22      PT LONG TERM GOAL #4   Title Pt. will ambulate with normalized gait pattern and consistent heel strike with no increase c/o L ankle pain without use of ankle support/brace.    Baseline L antalgic gait pattern.  7/10 L ankle pain.  01/26/22: marked improvement in ankle pain but occasional flare ups   Time 4    Period Weeks    Status Partially Met    Target Date 06/26/22      PT LONG TERM GOAL #5   Title Pt. will increase L shoulder strength to  grossly 5/5 MMT to improve  pain-free mobility and return to gym.    Baseline Seated L shoulder strength grossly 4/5 MMT (pain with flexion/ abduction).     Time 8    Period Weeks    Status Partially met   Target Date 06/26/22      PT LONG TERM GOAL #6  Title Pt. Able to tolerate 30 minute work-out at [employer] with no L shoulder pain/ limitations.    Baseline Pt. Has not returned to PF at this time.  8/28: pt. Has joined PF but not attended.   Time 8    Period Weeks    Status Not met   Target Date 06/26/22          Plan -     Clinical Impression Statement Pt. Instructed to return to [employer] to promote increase strengthening/ muscle endurance.  Improvement in R lumbar tenderness noted today during manual tx.  Good upright posture/ core muscle activation with seated blue ball ex.  Pt. Has progressed well with UE ex. On blue physioball.  Pain limited with L shoulder/ ankle palpation and progression in there.ex.   Pt will benefit from generalized UE/LE muscle strengthening and pain mgmt. to improve pain-free mobility.     Personal Factors and Comorbidities Comorbidity 3+    Examination-Activity Limitations Bathing;Bed Mobility;Bend;Carry;Dressing;Hygiene/Grooming;Lift;Locomotion Level;Sit;Squat;Stairs;Toileting;Stand    Examination-Participation Restrictions Cleaning;Laundry;Meal Prep;Community Activity    Stability/Clinical Decision Making Evolving/Moderate complexity    Clinical Decision Making Moderate    Rehab Potential Fair    PT Frequency 2x / week    PT Duration 8 weeks    PT Treatment/Interventions ADLs/Self Care Home Management;Aquatic Therapy;Cryotherapy;Iontophoresis 4mg/ml Dexamethasone;Patient/family education;Neuromuscular re-education;Balance training;Therapeutic exercise;Therapeutic activities;Functional mobility training;Stair training;Gait training;Manual techniques;Passive range of motion;Dry needling;Electrical Stimulation    PT Next Visit Plan Progress  strength training.        PT Home Exercise Plan [email or web address]    Consulted and Agree with Plan of Care Patient          Michael C Sherk, PT, DPT # [id number] 05/19/2022, 12:54 PM  [facility] [address] [place], [place], [address] Phone: [phone]   Fax:  [phone] 

## 2022-05-25 ENCOUNTER — Ambulatory Visit: Payer: Medicare Other | Admitting: Physical Therapy

## 2022-05-30 ENCOUNTER — Encounter: Payer: Medicare Other | Admitting: Physical Therapy

## 2022-06-02 ENCOUNTER — Ambulatory Visit: Payer: Medicare Other | Admitting: Physical Therapy

## 2022-06-02 ENCOUNTER — Encounter: Payer: Self-pay | Admitting: Physical Therapy

## 2022-06-02 DIAGNOSIS — M6281 Muscle weakness (generalized): Secondary | ICD-10-CM

## 2022-06-02 DIAGNOSIS — M25572 Pain in left ankle and joints of left foot: Secondary | ICD-10-CM

## 2022-06-02 DIAGNOSIS — M5441 Lumbago with sciatica, right side: Secondary | ICD-10-CM

## 2022-06-02 DIAGNOSIS — M2560 Stiffness of unspecified joint, not elsewhere classified: Secondary | ICD-10-CM

## 2022-06-02 DIAGNOSIS — M25672 Stiffness of left ankle, not elsewhere classified: Secondary | ICD-10-CM

## 2022-06-02 DIAGNOSIS — M25612 Stiffness of left shoulder, not elsewhere classified: Secondary | ICD-10-CM

## 2022-06-02 NOTE — Therapy (Signed)
OUTPATIENT PHYSICAL THERAPY TREATMENT NOTE   Patient Name: Kelsey Bell MRN: [id number] DOB:1980-11-01, 41 y.o., female Today's Date: 06/02/22  PCP: Ricardo Jericho, NP REFERRING PROVIDER: Ricardo Jericho, NP    PT End of Session -     Visit Number 52   Authorization Type [id number]   Recert date: 30/16/01   Authorization - Visit Number 1   Authorization - Number of Visits 10    PT Start Time 0932 to 1100   (37 minutes)   Activity Tolerance Patient limited by pain;Patient tolerated treatment well    Behavior During Therapy [facility] for tasks assessed/performed           Past Medical History:  Diagnosis Date   Allergy    Anxiety    Arthritis    Asthma    Breast pain present for several months   Bil LT >RT across of breasts   CRPS (complex regional pain syndrome type I)    Depression    DVT (deep venous thrombosis) ([place]) 06/2018   Fibromyalgia    Kidney stones    LGSIL on Pap smear of cervix 2007   Migraine    Neuromuscular disorder ([place])    Complex regional pain syndrome and Fibromyalgia   Pancreatic pseudocyst    Pancreatitis    Past Surgical History:  Procedure Laterality Date   COLPOSCOPY  2017   neg bx   ERCP     EXTRACORPOREAL SHOCK WAVE LITHOTRIPSY Right 04/26/2017   Procedure: EXTRACORPOREAL SHOCK WAVE LITHOTRIPSY (ESWL);  Surgeon: Hollice Espy, MD;  Location: [facility] ORS;  Service: Urology;  Laterality: Right;   GASTROSTOMY-JEJEUNOSTOMY TUBE CHANGE/PLACEMENT     KNEE ARTHROSCOPY Right    nj placement     TONSILLECTOMY     UPPER ESOPHAGEAL ENDOSCOPIC ULTRASOUND (EUS)     UPPER GASTROINTESTINAL ENDOSCOPY     Patient Active Problem List   Diagnosis Date Noted   Orthostatic syncope    Dehydration 06/09/2020   Genetic testing 10/24/2018   Idiopathic acute pancreatitis 07/22/2018   DVT (deep venous thrombosis) ([place]) 06/04/2018   Acute pancreatitis 05/27/2018   Right knee pain 05/19/2018   Peripheral edema 03/05/2018   Neuropathic pain  11/02/2017   Kidney stone 04/25/2017   Ureteral stone    Migraines 12/15/2016   Morbid obesity with BMI of 45.0-49.9, adult ([place]) 10/20/2015   Asthma, moderate 05/21/2015   Polyarthralgia 12/01/2011   Fibromyalgia 09/18/2011   Low back pain 09/18/2011    REFERRING DIAG: Sprain of L ankle/ L ankle pain/ L shoulder pain/ Low back pain  THERAPY DIAG:  Pain in left ankle and joints of left foot  Acute bilateral low back pain with bilateral sciatica  Muscle weakness (generalized)  Ankle joint stiffness, left  Shoulder joint stiffness, left  Joint stiffness  PERTINENT HISTORY: see evaluation  PRECAUTIONS: N/A  SUBJECTIVE:  06/02/22:  Pt. Reports generalized muscle/ joint pain in multiple joints.  Pt. Has not been able to exercise/ go to gym.  PT discussed benefits of aquatic ex.     PAIN:  Are you having pain? Yes in back/ B shoulder/ abdomen (generalized pain score 7/10).    TODAY'S TREATMENT:  thorough discussion of recent exacerbation of pain symptoms/ generalized fatigue.    06/02/22:  There ex:  L/R shoulder: flexion reassessment.  L shoulder flexion 109 deg./ abduction 130 deg.    No Nustep L5 B UE/LE for 10+ minutes.   Pt. Not feeling like exercising today.  Pt. Will benefit from discharge  from PT and referral to aquatic/ gym based ex.       PATIENT EDUCATION: Education details: Form/technique with exercise Person educated: Patient Education method: Customer service manager Education comprehension: verbalized understanding and returned demonstration   HOME EXERCISE PROGRAM: [place] #1   Title Pt will increase FOTO score to 56 to improve overall perceived functional ability.    Baseline IE: 27.  1/3: 27 (no change)- pain limited.  3/2: 83 (ankle/foot)- 20th visit.  5/25: unable to reassess.     Time 8    Period Weeks    Status Goal met   Target Date 02/20/22      PT LONG TERM GOAL #2   Title  Pt will increase ankle strength by at least 1/2 MMT grade in order to demonstrate improvement in strength and functio    Baseline L ankle strength grossly 3+/5 MMT (pain limited).  R ankle strength 5/5 MMT.  L ankle remain pain limited.  6/28: L ankle IV/EV limited to 4/5 MMT (marked pain).     Time 4   Period Weeks    Status Partially Met    Target Date 06/26/22      PT LONG TERM GOAL #3   Title Pt will increase L ankle AROM to [facility] as compared to R ankle to improve functional mobility.    Baseline L/R ankle AROM: DF (-5/11 deg.), PF (44/58 deg.), IV (12/30 deg.), EV (15/15 deg.).  6/28: L/R ankle AROM: DF (WNL), PF (WNL), IV (24/36 deg.), EV (8/30 deg.)- pain limited.     Time 4   Period Weeks    Status Partially Met    Target Date 06/26/22      PT LONG TERM GOAL #4   Title Pt. will ambulate with normalized gait pattern and consistent heel strike with no increase c/o L ankle pain without use of ankle support/brace.    Baseline L antalgic gait pattern.  7/10 L ankle pain.  01/26/22: marked improvement in ankle pain but occasional flare ups   Time 4    Period Weeks    Status Partially Met    Target Date 06/26/22      PT LONG TERM GOAL #5   Title Pt. will increase L shoulder strength to grossly 5/5 MMT to improve  pain-free mobility and return to gym.    Baseline Seated L shoulder strength grossly 4/5 MMT (pain with flexion/ abduction).     Time 8    Period Weeks    Status Partially met   Target Date 06/26/22      PT LONG TERM GOAL #6   Title Pt. Able to tolerate 30 minute work-out at [employer] with no L shoulder pain/ limitations.    Baseline Pt. Has not returned to PF at this time.  8/28: pt. Has joined PF but not attended.   Time 8    Period Weeks    Status Not met   Target Date 06/26/22          Plan -     Clinical Impression Statement Pt. Instructed to return to [employer] or aquatic ex. program to promote increase strengthening/ muscle endurance. Pain limited  with L shoulder AROM  Pt. Instructed to start aquatic ex. To improve generalized joint/ muscle pain.     Personal Factors and Comorbidities Comorbidity 3+    Examination-Activity Limitations Bathing;Bed Mobility;Bend;Carry;Dressing;Hygiene/Grooming;Lift;Locomotion Level;Sit;Squat;Stairs;Toileting;Stand  Examination-Participation Restrictions Cleaning;Laundry;Meal Prep;Community Activity    Stability/Clinical Decision Making Evolving/Moderate complexity    Clinical Decision Making Moderate    Rehab Potential Fair    PT Frequency 2x / week    PT Duration 8 weeks    PT Treatment/Interventions ADLs/Self Care Home Management;Aquatic Therapy;Cryotherapy;Iontophoresis 20m/ml Dexamethasone;Patient/family education;Neuromuscular re-education;Balance training;Therapeutic exercise;Therapeutic activities;Functional mobility training;Stair training;Gait training;Manual techniques;Passive range of motion;Dry needling;Electrical Stimulation    PT Next Visit Plan Discharge   [facility]   Consulted and Agree with Plan of Care Patient          MPura Spice PT, DPT # [id number]/29/2023, 10:09 AM  [place] [facility] [place] [address]Phone: [phone]  Fax:  [phone]

## 2022-06-07 ENCOUNTER — Ambulatory Visit: Payer: Medicare Other | Admitting: Physical Therapy

## 2022-06-14 ENCOUNTER — Encounter: Payer: Medicare Other | Admitting: Physical Therapy

## 2022-06-21 ENCOUNTER — Encounter: Payer: Medicare Other | Admitting: Physical Therapy

## 2022-06-29 ENCOUNTER — Encounter: Payer: Medicare Other | Admitting: Physical Therapy

## 2022-08-15 ENCOUNTER — Ambulatory Visit: Payer: Medicare Other | Attending: Orthopedic Surgery | Admitting: Physical Therapy

## 2022-08-15 DIAGNOSIS — M25512 Pain in left shoulder: Secondary | ICD-10-CM | POA: Diagnosis present

## 2022-08-15 DIAGNOSIS — M25612 Stiffness of left shoulder, not elsewhere classified: Secondary | ICD-10-CM | POA: Diagnosis present

## 2022-08-15 DIAGNOSIS — G8929 Other chronic pain: Secondary | ICD-10-CM | POA: Insufficient documentation

## 2022-08-15 DIAGNOSIS — M6281 Muscle weakness (generalized): Secondary | ICD-10-CM | POA: Diagnosis present

## 2022-08-18 ENCOUNTER — Ambulatory Visit: Payer: Medicare Other | Admitting: Physical Therapy

## 2022-08-19 NOTE — Therapy (Addendum)
OUTPATIENT PHYSICAL THERAPY SHOULDER EVALUATION   Patient Name: Kelsey MonsLauren B Bell MRN: [id number] DOB:04/08/1981, [age] y.o., female Today's Date: 08/15/2022  END OF SESSION:  PT End of Session - 08/19/22 0730     Visit Number 1    Number of Visits 17    Date for PT Re-Evaluation 10/10/22    PT Start Time 1639    PT Stop Time 1730    PT Time Calculation (min) 51 min    Activity Tolerance Patient limited by pain;Patient tolerated treatment well    [facility] for tasks assessed/performed             Past Medical History:  Diagnosis Date   Allergy    Anxiety    Arthritis    Asthma    Breast pain present for several months   Bil LT >RT across of breasts   CRPS (complex regional pain syndrome type I)    Depression    DVT (deep venous thrombosis) (HCC) 06/2018   Fibromyalgia    Kidney stones    LGSIL on Pap smear of cervix 2007   Migraine    Neuromuscular disorder (HCC)    Complex regional pain syndrome and Fibromyalgia   Pancreatic pseudocyst    Pancreatitis    Past Surgical History:  Procedure Laterality Date   COLPOSCOPY  2017   neg bx   ERCP     EXTRACORPOREAL SHOCK WAVE LITHOTRIPSY Right 04/26/2017   Procedure: EXTRACORPOREAL SHOCK WAVE LITHOTRIPSY (ESWL);  Surgeon: Vanna ScotlandBrandon, Ashley, MD;  Location: [facility] ORS;  Service: Urology;  Laterality: Right;   GASTROSTOMY-JEJEUNOSTOMY TUBE CHANGE/PLACEMENT     KNEE ARTHROSCOPY Right    nj placement     TONSILLECTOMY     UPPER ESOPHAGEAL ENDOSCOPIC ULTRASOUND (EUS)     UPPER GASTROINTESTINAL ENDOSCOPY     Patient Active Problem List   Diagnosis Date Noted   Orthostatic syncope    Dehydration 06/09/2020   Genetic testing 10/24/2018   Idiopathic acute pancreatitis 07/22/2018   DVT (deep venous thrombosis) (HCC) 06/04/2018   Acute pancreatitis 05/27/2018   Right knee pain 05/19/2018   Peripheral edema 03/05/2018   Neuropathic pain 11/02/2017   Kidney stone 04/25/2017   Ureteral stone    Migraines  12/15/2016   Morbid obesity with BMI of 45.0-49.9, adult (HCC) 10/20/2015   Asthma, moderate 05/21/2015   Polyarthralgia 12/01/2011   Fibromyalgia 09/18/2011   Low back pain 09/18/2011   PCP: Titus MouldWhite, Elizabeth Burney, NP  REFERRING PROVIDER: Virgina OrganLau, Brian Chei-Fai, MD  REFERRING DIAG: Acute pain of left shoulder  THERAPY DIAG:  Muscle weakness (generalized)  Chronic left shoulder pain  Shoulder joint stiffness, left  Rationale for Evaluation and Treatment: Rehabilitation  ONSET DATE: 08/11/22 (fall)  SUBJECTIVE:  SUBJECTIVE STATEMENT: Pt. Fell on curb/ L ankle turned resulting in L shoulder/ head injury.  Pt. Presents with ecchymosis on L forehead/ temple.  Pt. States he was assisted after fall and pulled up on L shoulder/arm resulting in increase pain.  Pt. C/o 7.5/10 L shoulder pain at rest and 3-4/10 at best with medications  PERTINENT HISTORY: Pt. Known well to PT clinic.  Chief Complaint:  Chief Complaint Patient presents with Head Injury Pt states she fell and hit her left side of head and shoulder on the concrete in a parking lot and she is having back pain x yesterday  Subjective:  Kelsey Bell is a 41 y.o. female in today for evaluation after a fall. She had a mechanical fall after she tripped in a parking lot. She hit her left shoulder, left forehead and right back. She says she has a left shoulder labral tear and did PT but now it hurts again since the fall. She says she set her alarm for every hour last night to be sure she could wake up. No nausea, vomiting.  No focal weakness or numbness.  She goes to the pain clinic. Chronic meds include Dilaudid, topamax, meloxicam. Tinazadine and tylenol. She takes these for chronic pancreatitis and CRPS and fibromyalgia.  Social Determinants of  Health  Financial Resource Strain: Medium Risk (06/04/2022) Overall Financial Resource Strain (CARDIA) Difficulty of Paying Living Expenses: Somewhat hard Food Insecurity: Food Insecurity Present (06/04/2022) Hunger Vital Sign Worried About Running Out of Food in the Last Year: Sometimes true Ran Out of Food in the Last Year: Sometimes true Transportation Needs: Unmet Transportation Needs (06/04/2022) PRAPARE - Contractor (Medical): Yes Lack of Transportation (Non-Medical): Yes Physical Activity: Inactive (10/05/2021) Exercise Vital Sign Days of Exercise per Week: 0 days Minutes of Exercise per Session: 0 min Stress: Stress Concern Present (10/05/2021) [employer] of Occupational Health - Occupational Stress Questionnaire Feeling of Stress : Very much Social Connections: Socially Isolated (10/05/2021) Social Connection and Isolation Panel [NHANES] Frequency of Communication with Friends and Family: More than three times a week Frequency of Social Gatherings with Friends and Family: Twice a week Attends Religious Services: Never Database administrator or Organizations: No Attends Banker Meetings: Never Marital Status: Never married Housing Stability: Low Risk (10/05/2021) Housing Stability Vital Sign Unable to Pay for Housing in the Last Year: No Number of Places Lived in the Last Year: 1 Unstable Housing in the Last Year: No  ROS See HPI  Objective:  Vitals: 08/12/22 0915 BP: 134/85 Pulse: 86 Resp: 18 Temp: 36.1 C (97 F) TempSrc: Oral SpO2: 97% Weight: (!) 126.6 kg (279 lb) Height: 172.7 cm (5' 7.99") PainSc: 7 PainLoc: Generalized  Body mass index is 42.43 kg/m. Patient's last menstrual period was 07/23/2022 (exact date).  General. Alert, answering all questions appropriately, in no acute distress EYES. Sclera and conjuctiva clear NOSE. No drainage OROPHARYNX. Moist mucous membranes, No suspicious lesions, normal  dentition, no posterior pharyngeal erythema, exudate or asymmetry NECK. Supple LUNGS. Respirations unlabored CARDIOVASCULAR. Regular rate MSK tenderness to palpation right over the midline thoracic spine, can flex extend and laterally rotate/twist spine. Normal gait. Left shoulder with tenderness to palpation over anterior aspect of glenohumeral joint. Decreased range of motion with extension and abduction above 90 degrees due to pain NEUROLOGIC. Speech intact; face symmetrical; no gross neurologic abnormalities SKIN No suspicious lesions identified  No results found for this visit on 08/12/22.  Assessment/Plan: Time: 30 minutes spent in patient facing  and nonpatient facing activities for this visit  Ms. Happ presents today for acute pain of the left forehead and left shoulder as well as right thoracic back area lateral to the midline after a mechanical fall yesterday when she tripped. Vital signs are normal. She has a normal gait. Good range of motion except with extension and abduction of her left arm above 90 degrees. Previous shoulder injury on the left side. X-rays of thoracic spine and left shoulder did not show any acute abnormality. She already has a PT she would like to see who is out of network of the [facility] system so I printed out her PT referral. She will follow-up with them in continue her chronic medicines as well as use aggressive ice on any areas that hurt.   PAIN:  Are you having pain? Yes: NPRS scale: 7.5/10 Pain location: L shoulder/ upper back Pain description: sore/ tender/ sharp Aggravating factors: palpation/ movement Relieving factors: medications  PRECAUTIONS: None  WEIGHT BEARING RESTRICTIONS: No  FALLS:  Has patient fallen in last 6 months? Yes. Number of falls 2  LIVING ENVIRONMENT: Lives with: lives alone Lives in: House/apartment Stairs: No Has following equipment at home: None  OCCUPATION: Disability  PLOF: Independent  PATIENT GOALS: Increase L  shoulder ROM/ decrease pain  NEXT MD VISIT: 12/20 (pain management).   OBJECTIVE:   DIAGNOSTIC FINDINGS:  X-ray (negative)  PATIENT SURVEYS:  FOTO initial 46/ goal 90  COGNITION: Overall cognitive status: Within functional limits for tasks assessed     SENSATION: WFL  POSTURE: Rounded shoulders in sitting/ standing  UPPER EXTREMITY ROM:   Active ROM Right eval Left eval  Shoulder flexion 142 deg 79 deg. (Pain)  Shoulder extension    Shoulder abduction 121 deg. 73 deg. (Pain)  Shoulder adduction    Shoulder internal rotation WNL 81 deg.  Shoulder external rotation WNL 76 deg.  Elbow flexion WNL WNL  Elbow extension WNL WNL  Wrist flexion WNL WNL  Wrist extension WNL WNL  Wrist ulnar deviation    Wrist radial deviation    Wrist pronation    Wrist supination    (Blank rows = not tested)  L shoulder PROM 148 deg. Flexion in supine (guarded/ pain).    Cervical spine WFL (all planes).  L elbow WFL (flexion/ extension).    UPPER EXTREMITY MMT:  R shoulder strength grossly 4/5 MMT, biceps/triceps 4+/5 MMT.  Unable to assess L shoulder at this time secondary to pain.  Grip strength: R 46.7#, L 50.4#.    SHOULDER SPECIAL TESTS: Impingement tests: Neer impingement test: positive  and Hawkins/Kennedy impingement test: positive  Rotator cuff assessment: Empty can test: negative and Full can test: negative   Difficulty assessing secondary pain with positioning.    JOINT MOBILITY TESTING:  Good L shoulder capsular mobility.    PALPATION:  Significant L anterior/ posterior shoulder tenderness.  Trigger points noted.    TODAY'S TREATMENT:  DATE: 12/12  Pulley ex./ ice   PATIENT EDUCATION: Education details: Pulley/ ice Person educated: Patient Education method: Medical illustrator Education comprehension: verbalized  understanding and returned demonstration  HOME EXERCISE PROGRAM: Pulley ex.  ASSESSMENT:  CLINICAL IMPRESSION: Patient is a pleasant 41 y.o. female who was seen today for physical therapy evaluation and treatment for L shoulder pain.  Pt. Has chronic h/o L shoulder pain with recent fall resulting in acute flare-up of L shoulder pain.  Limited L shoulder AROM and significant c/o pain with light palpation/ PROM.  Unable to strength test today.  No cervical/ elbow ROM limitations noted.  Pt. Will benefit from skilled PT services to increase L shoulder ROM/ strengthening to improve pain-free mobility.    OBJECTIVE IMPAIRMENTS: decreased mobility, decreased ROM, decreased strength, impaired flexibility, impaired UE functional use, improper body mechanics, postural dysfunction, and pain.   ACTIVITY LIMITATIONS: carrying, lifting, toileting, dressing, and reach over head  PARTICIPATION LIMITATIONS: cleaning, driving, shopping, and community activity  PERSONAL FACTORS: Fitness and Past/current experiences are also affecting patient's functional outcome.   REHAB POTENTIAL: Good  CLINICAL DECISION MAKING: Stable/uncomplicated  EVALUATION COMPLEXITY: Moderate   GOALS: Goals reviewed with patient? Yes  SHORT TERM GOALS: Target date: 09/12/22  Pt. Independent with HEP to increase L shoulder AROM to [facility] as compared to R shoulder to improve pain-free mobility.   Baseline:  See above Goal status: INITIAL  LONG TERM GOALS: Target date: 10/10/22  Pt. Will increase FOTO to 64 to improve pain-free UE mobility.   Baseline: initial 46 Goal status: INITIAL  2.  Pt. Will increase L shoulder flexion/ abduction strength to grossly 4/5 MMT with no increase c/o pain.  Baseline: Unable to assess at eval due to pain Goal status: INITIAL  3.  Pt. Able to reach/lift 10# object overhead with no L shoulder pain/ limitations.   Baseline:  See above Goal status: INITIAL  PLAN:  PT FREQUENCY:  1-2x/week  PT DURATION: 8 weeks  PLANNED INTERVENTIONS: Therapeutic exercises, Therapeutic activity, Neuromuscular re-education, Balance training, Gait training, Patient/Family education, Self Care, Joint mobilization, Dry Needling, Electrical stimulation, Cryotherapy, and Moist heat Manual   PLAN FOR NEXT SESSION: Progress HEP  Cammie Mcgee, PT, DPT # [phone] 08/19/2022, 7:33 AM

## 2022-08-21 ENCOUNTER — Ambulatory Visit: Payer: Medicare Other | Admitting: Physical Therapy

## 2022-08-21 DIAGNOSIS — M6281 Muscle weakness (generalized): Secondary | ICD-10-CM

## 2022-08-21 DIAGNOSIS — G8929 Other chronic pain: Secondary | ICD-10-CM

## 2022-08-21 DIAGNOSIS — M25612 Stiffness of left shoulder, not elsewhere classified: Secondary | ICD-10-CM

## 2022-08-24 ENCOUNTER — Encounter: Payer: Medicare Other | Admitting: Physical Therapy

## 2022-08-24 ENCOUNTER — Ambulatory Visit: Payer: Medicare Other | Admitting: Physical Therapy

## 2022-08-24 DIAGNOSIS — M25612 Stiffness of left shoulder, not elsewhere classified: Secondary | ICD-10-CM

## 2022-08-24 DIAGNOSIS — G8929 Other chronic pain: Secondary | ICD-10-CM

## 2022-08-24 DIAGNOSIS — M6281 Muscle weakness (generalized): Secondary | ICD-10-CM

## 2022-08-29 ENCOUNTER — Ambulatory Visit: Payer: Medicare Other | Admitting: Physical Therapy

## 2022-08-31 ENCOUNTER — Ambulatory Visit: Payer: Medicare Other | Admitting: Physical Therapy

## 2022-08-31 DIAGNOSIS — M25612 Stiffness of left shoulder, not elsewhere classified: Secondary | ICD-10-CM

## 2022-08-31 DIAGNOSIS — M6281 Muscle weakness (generalized): Secondary | ICD-10-CM

## 2022-08-31 DIAGNOSIS — G8929 Other chronic pain: Secondary | ICD-10-CM

## 2022-09-02 NOTE — Addendum Note (Signed)
Addended by: Cammie Mcgee on: 09/02/2022 11:36 AM   Modules accepted: Orders

## 2022-09-02 NOTE — Therapy (Signed)
OUTPATIENT PHYSICAL THERAPY SHOULDER TREATMENT   Patient Name: Kelsey Bell MRN: [id number] DOB:September 19, 1980, 41 y.o., female Today's Date: 08/21/2022  END OF SESSION:  PT End of Session - 09/02/22 1142     Visit Number 2    Number of Visits 17    Date for PT Re-Evaluation 10/10/22    PT Start Time 1351    PT Stop Time 1436    PT Time Calculation (min) 45 min    Activity Tolerance Patient limited by pain;Patient tolerated treatment well    Behavior During Therapy [facility] for tasks assessed/performed             Past Medical History:  Diagnosis Date   Allergy    Anxiety    Arthritis    Asthma    Breast pain present for several months   Bil LT >RT across of breasts   CRPS (complex regional pain syndrome type I)    Depression    DVT (deep venous thrombosis) (HCC) 06/2018   Fibromyalgia    Kidney stones    LGSIL on Pap smear of cervix 2007   Migraine    Neuromuscular disorder (HCC)    Complex regional pain syndrome and Fibromyalgia   Pancreatic pseudocyst    Pancreatitis    Past Surgical History:  Procedure Laterality Date   COLPOSCOPY  2017   neg bx   ERCP     EXTRACORPOREAL SHOCK WAVE LITHOTRIPSY Right 04/26/2017   Procedure: EXTRACORPOREAL SHOCK WAVE LITHOTRIPSY (ESWL);  Surgeon: Vanna Scotland, MD;  Location: [facility] ORS;  Service: Urology;  Laterality: Right;   GASTROSTOMY-JEJEUNOSTOMY TUBE CHANGE/PLACEMENT     KNEE ARTHROSCOPY Right    nj placement     TONSILLECTOMY     UPPER ESOPHAGEAL ENDOSCOPIC ULTRASOUND (EUS)     UPPER GASTROINTESTINAL ENDOSCOPY     Patient Active Problem List   Diagnosis Date Noted   Orthostatic syncope    Dehydration 06/09/2020   Genetic testing 10/24/2018   Idiopathic acute pancreatitis 07/22/2018   DVT (deep venous thrombosis) (HCC) 06/04/2018   Acute pancreatitis 05/27/2018   Right knee pain 05/19/2018   Peripheral edema 03/05/2018   Neuropathic pain 11/02/2017   Kidney stone 04/25/2017   Ureteral stone    Migraines  12/15/2016   Morbid obesity with BMI of 45.0-49.9, adult (HCC) 10/20/2015   Asthma, moderate 05/21/2015   Polyarthralgia 12/01/2011   Fibromyalgia 09/18/2011   Low back pain 09/18/2011   PCP: Titus Mould, NP  REFERRING PROVIDER: Virgina Organ, MD  REFERRING DIAG: Acute pain of left shoulder  THERAPY DIAG:  Chronic left shoulder pain  Shoulder joint stiffness, left  Muscle weakness (generalized)  Rationale for Evaluation and Treatment: Rehabilitation  ONSET DATE: 08/11/22 (fall)  SUBJECTIVE:  SUBJECTIVE STATEMENT:   EVALUATION Pt. Fell on curb/ L ankle turned resulting in L shoulder/ head injury.  Pt. Presents with ecchymosis on L forehead/ temple.  Pt. States he was assisted after fall and pulled up on L shoulder/arm resulting in increase pain.  Pt. C/o 7.5/10 L shoulder pain at rest and 3-4/10 at best with medications  PERTINENT HISTORY: Pt. Known well to PT clinic.  Chief Complaint:  Chief Complaint Patient presents with Head Injury Pt states she fell and hit her left side of head and shoulder on the concrete in a parking lot and she is having back pain x yesterday  Subjective:  Kelsey Bell is a 41 y.o. female in today for evaluation after a fall. She had a mechanical fall after she tripped in a parking lot. She hit her left shoulder, left forehead and right back. She says she has a left shoulder labral tear and did PT but now it hurts again since the fall. She says she set her alarm for every hour last night to be sure she could wake up. No nausea, vomiting.  No focal weakness or numbness.  She goes to the pain clinic. Chronic meds include Dilaudid, topamax, meloxicam. Tinazadine and tylenol. She takes these for chronic pancreatitis and CRPS and fibromyalgia.  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 Lack of Transportation (Non-Medical): Yes Physical Activity: Inactive (10/05/2021) Exercise Vital Sign Days of Exercise per Week: 0 days Minutes of Exercise per Session: 0 min Stress: Stress Concern Present (10/05/2021) [place] of Stress : Very much Social Connections: Socially Isolated (10/05/2021) Social Connection and Isolation Panel [NHANES] Frequency of Communication with Friends and Family: More than three times a week Frequency of Social Gatherings with Friends and Family: Twice a week Attends Religious Services: Never Marine scientist or Organizations: No Attends Archivist Meetings: Never Marital Status: Never married Housing Stability: [place] (10/05/2021) [place] Sign Unable to Pay for Housing in the Last Year: No Number of [place] in the Last Year: 1 Unstable Housing in the Last Year: No  ROS See HPI  Objective:  Vitals: 08/12/22 0915 BP: 134/85 Pulse: 86 Resp: 18 Temp: 36.1 C (97 F) TempSrc: Oral SpO2: 97% Weight: (!) 126.6 kg (279 lb) Height: 172.7 cm (5' 7.99") PainSc: 7 PainLoc: Generalized  Body mass index is 42.43 kg/m. Patient's last menstrual period was 07/23/2022 (exact date).  General. Alert, answering all questions appropriately, in no acute distress EYES. Sclera and conjuctiva clear NOSE. No drainage OROPHARYNX. Moist mucous membranes, No suspicious  lesions, normal dentition, no posterior pharyngeal erythema, exudate or asymmetry NECK. Supple LUNGS. Respirations unlabored CARDIOVASCULAR. Regular rate MSK tenderness to palpation right over the midline thoracic spine, can flex extend and laterally rotate/twist spine. Normal gait. Left shoulder with tenderness to palpation over anterior aspect of glenohumeral joint. Decreased range of motion with extension and abduction above 90 degrees due to pain NEUROLOGIC. Speech intact; face symmetrical; no gross neurologic abnormalities SKIN No suspicious lesions identified  No results found for this visit on 08/12/22.  Assessment/Plan: Time: 30 minutes spent  in patient facing and nonpatient facing activities for this visit  Ms. Meggison presents today for acute pain of the left forehead and left shoulder as well as right thoracic back area lateral to the midline after a mechanical fall yesterday when she tripped. Vital signs are normal. She has a normal gait. Good range of motion except with extension and abduction of her left arm above 90 degrees. Previous shoulder injury on the left side. X-rays of thoracic spine and left shoulder did not show any acute abnormality. She already has a PT she would like to see who is out of network of the [facility] system so I printed out her PT referral. She will follow-up with them in continue her chronic medicines as well as use aggressive ice on any areas that hurt.   PAIN:  Are you having pain? Yes: NPRS scale: 7.5/10 Pain location: L shoulder/ upper back Pain description: sore/ tender/ sharp Aggravating factors: palpation/ movement Relieving factors: medications  PRECAUTIONS: None  WEIGHT BEARING RESTRICTIONS: No  FALLS:  Has patient fallen in last 6 months? Yes. Number of falls 2  LIVING ENVIRONMENT: Lives with: lives alone Lives in: House/apartment Stairs: No Has following equipment at home: None  OCCUPATION: Disability  PLOF: Independent  PATIENT  GOALS: Increase L shoulder ROM/ decrease pain  NEXT MD VISIT: 12/20 (pain management).   OBJECTIVE:   DIAGNOSTIC FINDINGS:  X-ray (negative)  PATIENT SURVEYS:  FOTO initial 46/ goal 74  COGNITION: Overall cognitive status: Within functional limits for tasks assessed     SENSATION: WFL  POSTURE: Rounded shoulders in sitting/ standing  UPPER EXTREMITY ROM:   Active ROM Right eval Left eval  Shoulder flexion 142 deg 79 deg. (Pain)  Shoulder extension    Shoulder abduction 121 deg. 73 deg. (Pain)  Shoulder adduction    Shoulder internal rotation WNL 81 deg.  Shoulder external rotation WNL 76 deg.  Elbow flexion WNL WNL  Elbow extension WNL WNL  Wrist flexion WNL WNL  Wrist extension WNL WNL  Wrist ulnar deviation    Wrist radial deviation    Wrist pronation    Wrist supination    (Blank rows = not tested)  L shoulder PROM 148 deg. Flexion in supine (guarded/ pain).    Cervical spine WFL (all planes).  L elbow WFL (flexion/ extension).    UPPER EXTREMITY MMT:  R shoulder strength grossly 4/5 MMT, biceps/triceps 4+/5 MMT.  Unable to assess L shoulder at this time secondary to pain.  Grip strength: R 46.7#, L 50.4#.    SHOULDER SPECIAL TESTS: Impingement tests: Neer impingement test: positive  and Hawkins/Kennedy impingement test: positive  Rotator cuff assessment: Empty can test: negative and Full can test: negative   Difficulty assessing secondary pain with positioning.    JOINT MOBILITY TESTING:  Good L shoulder capsular mobility.    PALPATION:  Significant L anterior/ posterior shoulder tenderness.  Trigger points noted.    TODAY'S TREATMENT:  DATE: 12/18  Subjective:  Pt. Reports moderate L shoulder pain prior to tx. Session.  No subjective pain score given.  Pt. States she is trying to use L shoulder with everyday tasks/  pulley ex.   There.ex.:  Seated shoulder pulley: flexion/ abduction 20x (pain tolerable range).    Supine wand ex. (Chest press/ flexion)- 20x.  Discussed HEP  Manual tx.:  Supine L shoulder AA/PROM (all planes) in pain tolerable range 8x each.   Supine gentle STM to L anterior/ posterior deltoid/ upper trap musculature.    Supine cervical AA/PROM rotn./ lateral flexion 5x each.  Suboccipital/ gentle traction with static holds.     PATIENT EDUCATION: Education details: Education administrator Person educated: Patient Education method: Medical illustrator Education comprehension: verbalized understanding and returned demonstration  HOME EXERCISE PROGRAM: Pulley ex.  ASSESSMENT:  CLINICAL IMPRESSION: Pt. Presents with limited L shoulder AROM and significant c/o pain with light palpation/ PROM.  Good cervical rotn./ UT stretching in supine position.  No bruising over L shoulder/ upper back noted.  Ecchymosis remains over L eye/ forehaed  Pt. Will benefit from skilled PT services to increase L shoulder ROM/ strengthening to improve pain-free mobility.    OBJECTIVE IMPAIRMENTS: decreased mobility, decreased ROM, decreased strength, impaired flexibility, impaired UE functional use, improper body mechanics, postural dysfunction, and pain.   ACTIVITY LIMITATIONS: carrying, lifting, toileting, dressing, and reach over head  PARTICIPATION LIMITATIONS: cleaning, driving, shopping, and community activity  PERSONAL FACTORS: Fitness and Past/current experiences are also affecting patient's functional outcome.   REHAB POTENTIAL: Good  CLINICAL DECISION MAKING: Stable/uncomplicated  EVALUATION COMPLEXITY: Moderate   GOALS: Goals reviewed with patient? Yes  SHORT TERM GOALS: Target date: 09/12/22  Pt. Independent with HEP to increase L shoulder AROM to [facility] as compared to R shoulder to improve pain-free mobility.   Baseline:  See above Goal status: INITIAL  LONG TERM GOALS: Target  date: 10/10/22  Pt. Will increase FOTO to 64 to improve pain-free UE mobility.   Baseline: initial 46 Goal status: INITIAL  2.  Pt. Will increase L shoulder flexion/ abduction strength to grossly 4/5 MMT with no increase c/o pain.  Baseline: Unable to assess at eval due to pain Goal status: INITIAL  3.  Pt. Able to reach/lift 10# object overhead with no L shoulder pain/ limitations.   Baseline:  See above Goal status: INITIAL  PLAN:  PT FREQUENCY: 1-2x/week  PT DURATION: 8 weeks  PLANNED INTERVENTIONS: Therapeutic exercises, Therapeutic activity, Neuromuscular re-education, Balance training, Gait training, Patient/Family education, Self Care, Joint mobilization, Dry Needling, Electrical stimulation, Cryotherapy, and Moist heat Manual   PLAN FOR NEXT SESSION: Progress HEP  Cammie Mcgee, PT, DPT # [phone] 09/02/2022, 11:44 AM

## 2022-09-02 NOTE — Therapy (Signed)
OUTPATIENT PHYSICAL THERAPY SHOULDER TREATMENT   Patient Name: Kelsey Bell MRN: [id number] DOB:[birth year], 41 y.o., female Today's Date: 08/31/2022  END OF SESSION:  PT End of Session - 09/02/22 1201     Visit Number 4    Number of Visits 17    Date for PT Re-Evaluation 10/10/22    PT Start Time 1710    PT Stop Time 1803    PT Time Calculation (min) 53 min    Activity Tolerance Patient limited by pain;Patient tolerated treatment well    Behavior During Therapy [facility] for tasks assessed/performed             Past Medical History:  Diagnosis Date   Allergy    Anxiety    Arthritis    Asthma    Breast pain present for several months   Bil LT >RT across of breasts   CRPS (complex regional pain syndrome type I)    Depression    DVT (deep venous thrombosis) (HCC) 06/2018   Fibromyalgia    Kidney stones    LGSIL on Pap smear of cervix 2007   Migraine    Neuromuscular disorder (HCC)    Complex regional pain syndrome and Fibromyalgia   Pancreatic pseudocyst    Pancreatitis    Past Surgical History:  Procedure Laterality Date   COLPOSCOPY  2017   neg bx   ERCP     EXTRACORPOREAL SHOCK WAVE LITHOTRIPSY Right 04/26/2017   Procedure: EXTRACORPOREAL SHOCK WAVE LITHOTRIPSY (ESWL);  Surgeon: Vanna Scotland, MD;  Location: [facility] ORS;  Service: Urology;  Laterality: Right;   GASTROSTOMY-JEJEUNOSTOMY TUBE CHANGE/PLACEMENT     KNEE ARTHROSCOPY Right    nj placement     TONSILLECTOMY     UPPER ESOPHAGEAL ENDOSCOPIC ULTRASOUND (EUS)     UPPER GASTROINTESTINAL ENDOSCOPY     Patient Active Problem List   Diagnosis Date Noted   Orthostatic syncope    Dehydration 06/09/2020   Genetic testing 10/24/2018   Idiopathic acute pancreatitis 07/22/2018   DVT (deep venous thrombosis) (HCC) 06/04/2018   Acute pancreatitis 05/27/2018   Right knee pain 05/19/2018   Peripheral edema 03/05/2018   Neuropathic pain 11/02/2017   Kidney stone 04/25/2017   Ureteral stone    Migraines  12/15/2016   Morbid obesity with BMI of 45.0-49.9, adult (HCC) 10/20/2015   Asthma, moderate 05/21/2015   Polyarthralgia 12/01/2011   Fibromyalgia 09/18/2011   Low back pain 09/18/2011   PCP: Titus Mould, NP  REFERRING PROVIDER: Virgina Organ, MD  REFERRING DIAG: Acute pain of left shoulder  THERAPY DIAG:  Chronic left shoulder pain  Shoulder joint stiffness, left  Muscle weakness (generalized)  Rationale for Evaluation and Treatment: Rehabilitation  ONSET DATE: 08/11/22 (fall)  SUBJECTIVE:  SUBJECTIVE STATEMENT:   EVALUATION Pt. Fell on curb/ L ankle turned resulting in L shoulder/ head injury.  Pt. Presents with ecchymosis on L forehead/ temple.  Pt. States he was assisted after fall and pulled up on L shoulder/arm resulting in increase pain.  Pt. C/o 7.5/10 L shoulder pain at rest and 3-4/10 at best with medications  PERTINENT HISTORY: Pt. Known well to PT clinic.  Chief Complaint:  Chief Complaint Patient presents with Head Injury Pt states she fell and hit her left side of head and shoulder on the concrete in a parking lot and she is having back pain x yesterday  Subjective:  Kelsey Bell is a 41 y.o. female in today for evaluation after a fall. She had a mechanical fall after she tripped in a parking lot. She hit her left shoulder, left forehead and right back. She says she has a left shoulder labral tear and did PT but now it hurts again since the fall. She says she set her alarm for every hour last night to be sure she could wake up. No nausea, vomiting.  No focal weakness or numbness.  She goes to the pain clinic. Chronic meds include Dilaudid, topamax, meloxicam. Tinazadine and tylenol. She takes these for chronic pancreatitis and CRPS and fibromyalgia.  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 Lack of Transportation (Non-Medical): Yes Physical Activity: Inactive (10/05/2021) Exercise Vital Sign Days of Exercise per Week: 0 days Minutes of Exercise per Session: 0 min Stress: Stress Concern Present (10/05/2021) [place] of Stress : Very much Social Connections: Socially Isolated (10/05/2021) Social Connection and Isolation Panel [NHANES] Frequency of Communication with Friends and Family: More than three times a week Frequency of Social Gatherings with Friends and Family: Twice a week Attends Religious Services: Never Marine scientist or Organizations: No Attends Archivist Meetings: Never Marital Status: Never married Housing Stability: [place] (10/05/2021) [place] Sign Unable to Pay for Housing in the Last Year: No Number of [place] in the Last Year: 1 Unstable Housing in the Last Year: No  ROS See HPI  Objective:  Vitals: 08/12/22 0915 BP: 134/85 Pulse: 86 Resp: 18 Temp: 36.1 C (97 F) TempSrc: Oral SpO2: 97% Weight: (!) 126.6 kg (279 lb) Height: 172.7 cm (5' 7.99") PainSc: 7 PainLoc: Generalized  Body mass index is 42.43 kg/m. Patient's last menstrual period was 07/23/2022 (exact date).  General. Alert, answering all questions appropriately, in no acute distress EYES. Sclera and conjuctiva clear NOSE. No drainage OROPHARYNX. Moist mucous membranes, No suspicious  lesions, normal dentition, no posterior pharyngeal erythema, exudate or asymmetry NECK. Supple LUNGS. Respirations unlabored CARDIOVASCULAR. Regular rate MSK tenderness to palpation right over the midline thoracic spine, can flex extend and laterally rotate/twist spine. Normal gait. Left shoulder with tenderness to palpation over anterior aspect of glenohumeral joint. Decreased range of motion with extension and abduction above 90 degrees due to pain NEUROLOGIC. Speech intact; face symmetrical; no gross neurologic abnormalities SKIN No suspicious lesions identified  No results found for this visit on 08/12/22.  Assessment/Plan: Time: 30 minutes spent  in patient facing and nonpatient facing activities for this visit  Ms. Meggison presents today for acute pain of the left forehead and left shoulder as well as right thoracic back area lateral to the midline after a mechanical fall yesterday when she tripped. Vital signs are normal. She has a normal gait. Good range of motion except with extension and abduction of her left arm above 90 degrees. Previous shoulder injury on the left side. X-rays of thoracic spine and left shoulder did not show any acute abnormality. She already has a PT she would like to see who is out of network of the [facility] system so I printed out her PT referral. She will follow-up with them in continue her chronic medicines as well as use aggressive ice on any areas that hurt.   PAIN:  Are you having pain? Yes: NPRS scale: 7.5/10 Pain location: L shoulder/ upper back Pain description: sore/ tender/ sharp Aggravating factors: palpation/ movement Relieving factors: medications  PRECAUTIONS: None  WEIGHT BEARING RESTRICTIONS: No  FALLS:  Has patient fallen in last 6 months? Yes. Number of falls 2  LIVING ENVIRONMENT: Lives with: lives alone Lives in: House/apartment Stairs: No Has following equipment at home: None  OCCUPATION: Disability  PLOF: Independent  PATIENT  GOALS: Increase L shoulder ROM/ decrease pain  NEXT MD VISIT: 12/20 (pain management).   OBJECTIVE:   DIAGNOSTIC FINDINGS:  X-ray (negative)  PATIENT SURVEYS:  FOTO initial 46/ goal 74  COGNITION: Overall cognitive status: Within functional limits for tasks assessed     SENSATION: WFL  POSTURE: Rounded shoulders in sitting/ standing  UPPER EXTREMITY ROM:   Active ROM Right eval Left eval  Shoulder flexion 142 deg 79 deg. (Pain)  Shoulder extension    Shoulder abduction 121 deg. 73 deg. (Pain)  Shoulder adduction    Shoulder internal rotation WNL 81 deg.  Shoulder external rotation WNL 76 deg.  Elbow flexion WNL WNL  Elbow extension WNL WNL  Wrist flexion WNL WNL  Wrist extension WNL WNL  Wrist ulnar deviation    Wrist radial deviation    Wrist pronation    Wrist supination    (Blank rows = not tested)  L shoulder PROM 148 deg. Flexion in supine (guarded/ pain).    Cervical spine WFL (all planes).  L elbow WFL (flexion/ extension).    UPPER EXTREMITY MMT:  R shoulder strength grossly 4/5 MMT, biceps/triceps 4+/5 MMT.  Unable to assess L shoulder at this time secondary to pain.  Grip strength: R 46.7#, L 50.4#.    SHOULDER SPECIAL TESTS: Impingement tests: Neer impingement test: positive  and Hawkins/Kennedy impingement test: positive  Rotator cuff assessment: Empty can test: negative and Full can test: negative   Difficulty assessing secondary pain with positioning.    JOINT MOBILITY TESTING:  Good L shoulder capsular mobility.    PALPATION:  Significant L anterior/ posterior shoulder tenderness.  Trigger points noted.    TODAY'S TREATMENT:  DATE: 08/31/22  Subjective:  Pt. States she is dealing with gastroparesis and has been having GI issues this week.  Pt. Reports feeling better this afternoon.  Pt. C/o tenderness/ pain  in L shoulder prior to tx. Session.    There.ex.:  Seated shoulder pulley (flexion/ abduction)- warm-up  Supine/ seated L shoulder AROM (all planes)- as tolerated.    Supine L shoulder AROM: 148 deg. Flexion after tx.    Manual tx.:  Supine L shoulder AA/PROM (all planes) in pain tolerable range 5x each.   Supine L shoulder inferior/ AP grade II-III mobs 3x20 sec.  L shoulder distraction with gentile oscillations.   Supine gentle STM to L anterior/ posterior deltoid/ upper trap/ cervical paraspinal musculature.    Supine cervical AA/PROM rotn./ lateral flexion 5x each.  Suboccipital/ gentle traction with static holds.  Good rotn. To L/R  MH to L UT/ posterior deltoid after tx.    PATIENT EDUCATION: Education details: Materials engineer Person educated: Patient Education method: Customer service manager Education comprehension: verbalized understanding and returned demonstration  HOME EXERCISE PROGRAM: Pulley ex.  ASSESSMENT:  CLINICAL IMPRESSION: Pt. Presents with marked increase in L shoulder AROM after tx. Session.  Pts. Pain/ tenderness over L posterior shoulder remain high.  Good cervical rotn./ UT stretching in supine position.  Discussed Trigger point dry needling to L UT/ posterior deltoid next tx. Session.  Pt. Will continue with current HEP and instructed to move L shoulder as tolerated.  Pt. Will benefit from skilled PT services to increase L shoulder ROM/ strengthening to improve pain-free mobility.    OBJECTIVE IMPAIRMENTS: decreased mobility, decreased ROM, decreased strength, impaired flexibility, impaired UE functional use, improper body mechanics, postural dysfunction, and pain.   ACTIVITY LIMITATIONS: carrying, lifting, toileting, dressing, and reach over head  PARTICIPATION LIMITATIONS: cleaning, driving, shopping, and community activity  PERSONAL FACTORS: Fitness and Past/current experiences are also affecting patient's functional outcome.   REHAB  POTENTIAL: Good  CLINICAL DECISION MAKING: Stable/uncomplicated  EVALUATION COMPLEXITY: Moderate   GOALS: Goals reviewed with patient? Yes  SHORT TERM GOALS: Target date: 09/12/22  Pt. Independent with HEP to increase L shoulder AROM to [facility] as compared to R shoulder to improve pain-free mobility.   Baseline:  See above Goal status: INITIAL  LONG TERM GOALS: Target date: 10/10/22  Pt. Will increase FOTO to 64 to improve pain-free UE mobility.   Baseline: initial 46 Goal status: INITIAL  2.  Pt. Will increase L shoulder flexion/ abduction strength to grossly 4/5 MMT with no increase c/o pain.  Baseline: Unable to assess at eval due to pain Goal status: INITIAL  3.  Pt. Able to reach/lift 10# object overhead with no L shoulder pain/ limitations.   Baseline:  See above Goal status: INITIAL  PLAN:  PT FREQUENCY: 1-2x/week  PT DURATION: 8 weeks  PLANNED INTERVENTIONS: Therapeutic exercises, Therapeutic activity, Neuromuscular re-education, Balance training, Gait training, Patient/Family education, Self Care, Joint mobilization, Dry Needling, Electrical stimulation, Cryotherapy, and Moist heat Manual   PLAN FOR NEXT SESSION: Reassess seated L shoulder ROM/ check MMT.  Trigger point dry needling.   Pura Spice, PT, DPT # [phone] 09/02/2022, 12:03 PM

## 2022-09-02 NOTE — Therapy (Signed)
OUTPATIENT PHYSICAL THERAPY SHOULDER TREATMENT   Patient Name: Kelsey Bell MRN: [id number] DOB:10-24-1980, 41 y.o., female Today's Date: 08/24/2022  END OF SESSION:  PT End of Session - 09/02/22 1141     Visit Number 3    Number of Visits 17    Date for PT Re-Evaluation 10/10/22    PT Start Time 0733    PT Stop Time 0820    PT Time Calculation (min) 47 min    Activity Tolerance Patient limited by pain;Patient tolerated treatment well    Behavior During Therapy [facility] for tasks assessed/performed             Past Medical History:  Diagnosis Date   Allergy    Anxiety    Arthritis    Asthma    Breast pain present for several months   Bil LT >RT across of breasts   CRPS (complex regional pain syndrome type I)    Depression    DVT (deep venous thrombosis) (HCC) 06/2018   Fibromyalgia    Kidney stones    LGSIL on Pap smear of cervix 2007   Migraine    Neuromuscular disorder (HCC)    Complex regional pain syndrome and Fibromyalgia   Pancreatic pseudocyst    Pancreatitis    Past Surgical History:  Procedure Laterality Date   COLPOSCOPY  2017   neg bx   ERCP     EXTRACORPOREAL SHOCK WAVE LITHOTRIPSY Right 04/26/2017   Procedure: EXTRACORPOREAL SHOCK WAVE LITHOTRIPSY (ESWL);  Surgeon: Vanna Scotland, MD;  Location: [facility] ORS;  Service: Urology;  Laterality: Right;   GASTROSTOMY-JEJEUNOSTOMY TUBE CHANGE/PLACEMENT     KNEE ARTHROSCOPY Right    nj placement     TONSILLECTOMY     UPPER ESOPHAGEAL ENDOSCOPIC ULTRASOUND (EUS)     UPPER GASTROINTESTINAL ENDOSCOPY     Patient Active Problem List   Diagnosis Date Noted   Orthostatic syncope    Dehydration 06/09/2020   Genetic testing 10/24/2018   Idiopathic acute pancreatitis 07/22/2018   DVT (deep venous thrombosis) (HCC) 06/04/2018   Acute pancreatitis 05/27/2018   Right knee pain 05/19/2018   Peripheral edema 03/05/2018   Neuropathic pain 11/02/2017   Kidney stone 04/25/2017   Ureteral stone    Migraines  12/15/2016   Morbid obesity with BMI of 45.0-49.9, adult (HCC) 10/20/2015   Asthma, moderate 05/21/2015   Polyarthralgia 12/01/2011   Fibromyalgia 09/18/2011   Low back pain 09/18/2011   PCP: Titus Mould, NP  REFERRING PROVIDER: Virgina Organ, MD  REFERRING DIAG: Acute pain of left shoulder  THERAPY DIAG:  Chronic left shoulder pain  Shoulder joint stiffness, left  Muscle weakness (generalized)  Rationale for Evaluation and Treatment: Rehabilitation  ONSET DATE: 08/11/22 (fall)  SUBJECTIVE:  SUBJECTIVE STATEMENT:   EVALUATION Pt. Fell on curb/ L ankle turned resulting in L shoulder/ head injury.  Pt. Presents with ecchymosis on L forehead/ temple.  Pt. States he was assisted after fall and pulled up on L shoulder/arm resulting in increase pain.  Pt. C/o 7.5/10 L shoulder pain at rest and 3-4/10 at best with medications  PERTINENT HISTORY: Pt. Known well to PT clinic.  Chief Complaint:  Chief Complaint Patient presents with Head Injury Pt states she fell and hit her left side of head and shoulder on the concrete in a parking lot and she is having back pain x yesterday  Subjective:  Kelsey Bell is a 41 y.o. female in today for evaluation after a fall. She had a mechanical fall after she tripped in a parking lot. She hit her left shoulder, left forehead and right back. She says she has a left shoulder labral tear and did PT but now it hurts again since the fall. She says she set her alarm for every hour last night to be sure she could wake up. No nausea, vomiting.  No focal weakness or numbness.  She goes to the pain clinic. Chronic meds include Dilaudid, topamax, meloxicam. Tinazadine and tylenol. She takes these for chronic pancreatitis and CRPS and fibromyalgia.  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 Lack of Transportation (Non-Medical): Yes Physical Activity: Inactive (10/05/2021) Exercise Vital Sign Days of Exercise per Week: 0 days Minutes of Exercise per Session: 0 min Stress: Stress Concern Present (10/05/2021) [place] of Stress : Very much Social Connections: Socially Isolated (10/05/2021) Social Connection and Isolation Panel [NHANES] Frequency of Communication with Friends and Family: More than three times a week Frequency of Social Gatherings with Friends and Family: Twice a week Attends Religious Services: Never Marine scientist or Organizations: No Attends Archivist Meetings: Never Marital Status: Never married Housing Stability: [place] (10/05/2021) [place] Sign Unable to Pay for Housing in the Last Year: No Number of [place] in the Last Year: 1 Unstable Housing in the Last Year: No  ROS See HPI  Objective:  Vitals: 08/12/22 0915 BP: 134/85 Pulse: 86 Resp: 18 Temp: 36.1 C (97 F) TempSrc: Oral SpO2: 97% Weight: (!) 126.6 kg (279 lb) Height: 172.7 cm (5' 7.99") PainSc: 7 PainLoc: Generalized  Body mass index is 42.43 kg/m. Patient's last menstrual period was 07/23/2022 (exact date).  General. Alert, answering all questions appropriately, in no acute distress EYES. Sclera and conjuctiva clear NOSE. No drainage OROPHARYNX. Moist mucous membranes, No suspicious  lesions, normal dentition, no posterior pharyngeal erythema, exudate or asymmetry NECK. Supple LUNGS. Respirations unlabored CARDIOVASCULAR. Regular rate MSK tenderness to palpation right over the midline thoracic spine, can flex extend and laterally rotate/twist spine. Normal gait. Left shoulder with tenderness to palpation over anterior aspect of glenohumeral joint. Decreased range of motion with extension and abduction above 90 degrees due to pain NEUROLOGIC. Speech intact; face symmetrical; no gross neurologic abnormalities SKIN No suspicious lesions identified  No results found for this visit on 08/12/22.  Assessment/Plan: Time: 30 minutes spent  in patient facing and nonpatient facing activities for this visit  Ms. Meggison presents today for acute pain of the left forehead and left shoulder as well as right thoracic back area lateral to the midline after a mechanical fall yesterday when she tripped. Vital signs are normal. She has a normal gait. Good range of motion except with extension and abduction of her left arm above 90 degrees. Previous shoulder injury on the left side. X-rays of thoracic spine and left shoulder did not show any acute abnormality. She already has a PT she would like to see who is out of network of the [facility] system so I printed out her PT referral. She will follow-up with them in continue her chronic medicines as well as use aggressive ice on any areas that hurt.   PAIN:  Are you having pain? Yes: NPRS scale: 7.5/10 Pain location: L shoulder/ upper back Pain description: sore/ tender/ sharp Aggravating factors: palpation/ movement Relieving factors: medications  PRECAUTIONS: None  WEIGHT BEARING RESTRICTIONS: No  FALLS:  Has patient fallen in last 6 months? Yes. Number of falls 2  LIVING ENVIRONMENT: Lives with: lives alone Lives in: House/apartment Stairs: No Has following equipment at home: None  OCCUPATION: Disability  PLOF: Independent  PATIENT  GOALS: Increase L shoulder ROM/ decrease pain  NEXT MD VISIT: 12/20 (pain management).   OBJECTIVE:   DIAGNOSTIC FINDINGS:  X-ray (negative)  PATIENT SURVEYS:  FOTO initial 46/ goal 74  COGNITION: Overall cognitive status: Within functional limits for tasks assessed     SENSATION: WFL  POSTURE: Rounded shoulders in sitting/ standing  UPPER EXTREMITY ROM:   Active ROM Right eval Left eval  Shoulder flexion 142 deg 79 deg. (Pain)  Shoulder extension    Shoulder abduction 121 deg. 73 deg. (Pain)  Shoulder adduction    Shoulder internal rotation WNL 81 deg.  Shoulder external rotation WNL 76 deg.  Elbow flexion WNL WNL  Elbow extension WNL WNL  Wrist flexion WNL WNL  Wrist extension WNL WNL  Wrist ulnar deviation    Wrist radial deviation    Wrist pronation    Wrist supination    (Blank rows = not tested)  L shoulder PROM 148 deg. Flexion in supine (guarded/ pain).    Cervical spine WFL (all planes).  L elbow WFL (flexion/ extension).    UPPER EXTREMITY MMT:  R shoulder strength grossly 4/5 MMT, biceps/triceps 4+/5 MMT.  Unable to assess L shoulder at this time secondary to pain.  Grip strength: R 46.7#, L 50.4#.    SHOULDER SPECIAL TESTS: Impingement tests: Neer impingement test: positive  and Hawkins/Kennedy impingement test: positive  Rotator cuff assessment: Empty can test: negative and Full can test: negative   Difficulty assessing secondary pain with positioning.    JOINT MOBILITY TESTING:  Good L shoulder capsular mobility.    PALPATION:  Significant L anterior/ posterior shoulder tenderness.  Trigger points noted.    TODAY'S TREATMENT:  DATE: 08/24/22  Subjective:  Pt. Arrived to PT upset about several life situations.  Pt. Opened car trunk and hit head (bruise present).  Pt. Having a difficult time with daily tasks  involving L shoulder.    There.ex.:  Supine wand ex. (Chest press/ flexion)- 20x.  Discussed daily ADL/ household tasks.    Supine/ seated L shoulder AROM (all planes)- as tolerated.    Supine L shoulder AROM: 140 deg. Flexion after tx.    Estim:  IFC to L shoulder with pts. Personal TENS unit.  Customized set-up for sensory level TENS for pain mgmt with exercise/ manual tx./ daily tasks. See presets on unit.  Intensity (as tolerated)- managed by pt.   Manual tx.:  Supine L shoulder AA/PROM (all planes) in pain tolerable range 5x each.   Supine gentle STM to L anterior/ posterior deltoid/ upper trap/ cervical paraspinal musculature.    Supine cervical AA/PROM rotn./ lateral flexion 5x each.  Suboccipital/ gentle traction with static holds.     PATIENT EDUCATION: Education details: Education administrator Person educated: Patient Education method: Medical illustrator Education comprehension: verbalized understanding and returned demonstration  HOME EXERCISE PROGRAM: Pulley ex.  ASSESSMENT:  CLINICAL IMPRESSION: Pt. Presents with marked increase in L shoulder AROM after tx. Session.  Pts. Pain/ tenderness over L posterior shoulder remain high.  Good cervical rotn./ UT stretching in supine position.  Pt. Will continue with current HEP and instructed to move L shoulder as tolerated.  Pt. Will benefit from skilled PT services to increase L shoulder ROM/ strengthening to improve pain-free mobility.    OBJECTIVE IMPAIRMENTS: decreased mobility, decreased ROM, decreased strength, impaired flexibility, impaired UE functional use, improper body mechanics, postural dysfunction, and pain.   ACTIVITY LIMITATIONS: carrying, lifting, toileting, dressing, and reach over head  PARTICIPATION LIMITATIONS: cleaning, driving, shopping, and community activity  PERSONAL FACTORS: Fitness and Past/current experiences are also affecting patient's functional outcome.   REHAB POTENTIAL:  Good  CLINICAL DECISION MAKING: Stable/uncomplicated  EVALUATION COMPLEXITY: Moderate   GOALS: Goals reviewed with patient? Yes  SHORT TERM GOALS: Target date: 09/12/22  Pt. Independent with HEP to increase L shoulder AROM to [facility] as compared to R shoulder to improve pain-free mobility.   Baseline:  See above Goal status: INITIAL  LONG TERM GOALS: Target date: 10/10/22  Pt. Will increase FOTO to 64 to improve pain-free UE mobility.   Baseline: initial 46 Goal status: INITIAL  2.  Pt. Will increase L shoulder flexion/ abduction strength to grossly 4/5 MMT with no increase c/o pain.  Baseline: Unable to assess at eval due to pain Goal status: INITIAL  3.  Pt. Able to reach/lift 10# object overhead with no L shoulder pain/ limitations.   Baseline:  See above Goal status: INITIAL  PLAN:  PT FREQUENCY: 1-2x/week  PT DURATION: 8 weeks  PLANNED INTERVENTIONS: Therapeutic exercises, Therapeutic activity, Neuromuscular re-education, Balance training, Gait training, Patient/Family education, Self Care, Joint mobilization, Dry Needling, Electrical stimulation, Cryotherapy, and Moist heat Manual   PLAN FOR NEXT SESSION: Progress HEP  Cammie Mcgee, PT, DPT # [phone] 09/02/2022, 11:53 AM

## 2022-09-05 ENCOUNTER — Ambulatory Visit: Payer: 59 | Attending: Orthopedic Surgery | Admitting: Physical Therapy

## 2022-09-05 DIAGNOSIS — M5441 Lumbago with sciatica, right side: Secondary | ICD-10-CM | POA: Insufficient documentation

## 2022-09-05 DIAGNOSIS — G8929 Other chronic pain: Secondary | ICD-10-CM | POA: Insufficient documentation

## 2022-09-05 DIAGNOSIS — M25572 Pain in left ankle and joints of left foot: Secondary | ICD-10-CM | POA: Diagnosis present

## 2022-09-05 DIAGNOSIS — M25612 Stiffness of left shoulder, not elsewhere classified: Secondary | ICD-10-CM | POA: Insufficient documentation

## 2022-09-05 DIAGNOSIS — M6281 Muscle weakness (generalized): Secondary | ICD-10-CM | POA: Insufficient documentation

## 2022-09-05 DIAGNOSIS — M25512 Pain in left shoulder: Secondary | ICD-10-CM | POA: Diagnosis not present

## 2022-09-05 DIAGNOSIS — M5442 Lumbago with sciatica, left side: Secondary | ICD-10-CM | POA: Diagnosis present

## 2022-09-05 NOTE — Therapy (Signed)
OUTPATIENT PHYSICAL THERAPY SHOULDER TREATMENT   Patient Name: Kelsey Bell MRN: [id number] DOB:12/23/1980, 42 y.o., female Today's Date: 09/05/2022  END OF SESSION:  PT End of Session - 09/05/22 1236     Visit Number 5    Number of Visits 17    Date for PT Re-Evaluation 10/10/22    PT Start Time 1055    PT Stop Time 1144    PT Time Calculation (min) 49 min    Activity Tolerance Patient limited by pain;Patient tolerated treatment well    Behavior During Therapy [facility] for tasks assessed/performed              Past Medical History:  Diagnosis Date   Allergy    Anxiety    Arthritis    Asthma    Breast pain present for several months   Bil LT >RT across of breasts   CRPS (complex regional pain syndrome type I)    Depression    DVT (deep venous thrombosis) ([place]) 06/2018   Fibromyalgia    Kidney stones    LGSIL on Pap smear of cervix 2007   Migraine    Neuromuscular disorder ([place])    Complex regional pain syndrome and Fibromyalgia   Pancreatic pseudocyst    Pancreatitis    Past Surgical History:  Procedure Laterality Date   COLPOSCOPY  2017   neg bx   ERCP     EXTRACORPOREAL SHOCK WAVE LITHOTRIPSY Right 04/26/2017   Procedure: EXTRACORPOREAL SHOCK WAVE LITHOTRIPSY (ESWL);  Surgeon: Hollice Espy, MD;  Location: [facility] ORS;  Service: Urology;  Laterality: Right;   GASTROSTOMY-JEJEUNOSTOMY TUBE CHANGE/PLACEMENT     KNEE ARTHROSCOPY Right    nj placement     TONSILLECTOMY     UPPER ESOPHAGEAL ENDOSCOPIC ULTRASOUND (EUS)     UPPER GASTROINTESTINAL ENDOSCOPY     Patient Active Problem List   Diagnosis Date Noted   Orthostatic syncope    Dehydration 06/09/2020   Genetic testing 10/24/2018   Idiopathic acute pancreatitis 07/22/2018   DVT (deep venous thrombosis) ([place]) 06/04/2018   Acute pancreatitis 05/27/2018   Right knee pain 05/19/2018   Peripheral edema 03/05/2018   Neuropathic pain 11/02/2017   Kidney stone 04/25/2017   Ureteral stone    Migraines  12/15/2016   Morbid obesity with BMI of 45.0-49.9, adult ([place]) 10/20/2015   Asthma, moderate 05/21/2015   Polyarthralgia 12/01/2011   Fibromyalgia 09/18/2011   Low back pain 09/18/2011   PCP: Ricardo Jericho, NP  REFERRING PROVIDER: Anderson Malta, MD  REFERRING DIAG: Acute pain of left shoulder  THERAPY DIAG:  Chronic left shoulder pain  Shoulder joint stiffness, left  Muscle weakness (generalized)  Rationale for Evaluation and Treatment: Rehabilitation  ONSET DATE: 08/11/22 (fall)  SUBJECTIVE:  SUBJECTIVE STATEMENT:   EVALUATION Pt. Fell on curb/ L ankle turned resulting in L shoulder/ head injury.  Pt. Presents with ecchymosis on L forehead/ temple.  Pt. States he was assisted after fall and pulled up on L shoulder/arm resulting in increase pain.  Pt. C/o 7.5/10 L shoulder pain at rest and 3-4/10 at best with medications  PERTINENT HISTORY: Pt. Known well to PT clinic.  Chief Complaint:  Chief Complaint Patient presents with Head Injury Pt states she fell and hit her left side of head and shoulder on the concrete in a parking lot and she is having back pain x yesterday  Subjective:  Kelsey Bell is a 42 y.o. female in today for evaluation after a fall. She had a mechanical fall after she tripped in a parking lot. She hit her left shoulder, left forehead and right back. She says she has a left shoulder labral tear and did PT but now it hurts again since the fall. She says she set her alarm for every hour last night to be sure she could wake up. No nausea, vomiting.  No focal weakness or numbness.  She goes to the pain clinic. Chronic meds include Dilaudid, topamax, meloxicam. Tinazadine and tylenol. She takes these for chronic pancreatitis and CRPS and fibromyalgia.  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 Lack of Transportation (Non-Medical): Yes Physical Activity: Inactive (10/05/2021) Exercise Vital Sign Days of Exercise per Week: 0 days Minutes of Exercise per Session: 0 min Stress: Stress Concern Present (10/05/2021) [place] of Stress : Very much Social Connections: Socially Isolated (10/05/2021) Social Connection and Isolation Panel [NHANES] Frequency of Communication with Friends and Family: More than three times a week Frequency of Social Gatherings with Friends and Family: Twice a week Attends Religious Services: Never Marine scientist or Organizations: No Attends Archivist Meetings: Never Marital Status: Never married Housing Stability: [place] (10/05/2021) [place] Sign Unable to Pay for Housing in the Last Year: No Number of [place] in the Last Year: 1 Unstable Housing in the Last Year: No  ROS See HPI  Objective:  Vitals: 08/12/22 0915 BP: 134/85 Pulse: 86 Resp: 18 Temp: 36.1 C (97 F) TempSrc: Oral SpO2: 97% Weight: (!) 126.6 kg (279 lb) Height: 172.7 cm (5' 7.99") PainSc: 7 PainLoc: Generalized  Body mass index is 42.43 kg/m. Patient's last menstrual period was 07/23/2022 (exact date).  General. Alert, answering all questions appropriately, in no acute distress EYES. Sclera and conjuctiva clear NOSE. No drainage OROPHARYNX. Moist mucous membranes, No suspicious  lesions, normal dentition, no posterior pharyngeal erythema, exudate or asymmetry NECK. Supple LUNGS. Respirations unlabored CARDIOVASCULAR. Regular rate MSK tenderness to palpation right over the midline thoracic spine, can flex extend and laterally rotate/twist spine. Normal gait. Left shoulder with tenderness to palpation over anterior aspect of glenohumeral joint. Decreased range of motion with extension and abduction above 90 degrees due to pain NEUROLOGIC. Speech intact; face symmetrical; no gross neurologic abnormalities SKIN No suspicious lesions identified  No results found for this visit on 08/12/22.  Assessment/Plan: Time: 30 minutes spent  in patient facing and nonpatient facing activities for this visit  Ms. Meggison presents today for acute pain of the left forehead and left shoulder as well as right thoracic back area lateral to the midline after a mechanical fall yesterday when she tripped. Vital signs are normal. She has a normal gait. Good range of motion except with extension and abduction of her left arm above 90 degrees. Previous shoulder injury on the left side. X-rays of thoracic spine and left shoulder did not show any acute abnormality. She already has a PT she would like to see who is out of network of the [facility] system so I printed out her PT referral. She will follow-up with them in continue her chronic medicines as well as use aggressive ice on any areas that hurt.   PAIN:  Are you having pain? Yes: NPRS scale: 7.5/10 Pain location: L shoulder/ upper back Pain description: sore/ tender/ sharp Aggravating factors: palpation/ movement Relieving factors: medications  PRECAUTIONS: None  WEIGHT BEARING RESTRICTIONS: No  FALLS:  Has patient fallen in last 6 months? Yes. Number of falls 2  LIVING ENVIRONMENT: Lives with: lives alone Lives in: House/apartment Stairs: No Has following equipment at home: None  OCCUPATION: Disability  PLOF: Independent  PATIENT  GOALS: Increase L shoulder ROM/ decrease pain  NEXT MD VISIT: 12/20 (pain management).   OBJECTIVE:   DIAGNOSTIC FINDINGS:  X-ray (negative)  PATIENT SURVEYS:  FOTO initial 46/ goal 74  COGNITION: Overall cognitive status: Within functional limits for tasks assessed     SENSATION: WFL  POSTURE: Rounded shoulders in sitting/ standing  UPPER EXTREMITY ROM:   Active ROM Right eval Left eval  Shoulder flexion 142 deg 79 deg. (Pain)  Shoulder extension    Shoulder abduction 121 deg. 73 deg. (Pain)  Shoulder adduction    Shoulder internal rotation WNL 81 deg.  Shoulder external rotation WNL 76 deg.  Elbow flexion WNL WNL  Elbow extension WNL WNL  Wrist flexion WNL WNL  Wrist extension WNL WNL  Wrist ulnar deviation    Wrist radial deviation    Wrist pronation    Wrist supination    (Blank rows = not tested)  L shoulder PROM 148 deg. Flexion in supine (guarded/ pain).    Cervical spine WFL (all planes).  L elbow WFL (flexion/ extension).    UPPER EXTREMITY MMT:  R shoulder strength grossly 4/5 MMT, biceps/triceps 4+/5 MMT.  Unable to assess L shoulder at this time secondary to pain.  Grip strength: R 46.7#, L 50.4#.    SHOULDER SPECIAL TESTS: Impingement tests: Neer impingement test: positive  and Hawkins/Kennedy impingement test: positive  Rotator cuff assessment: Empty can test: negative and Full can test: negative   Difficulty assessing secondary pain with positioning.    JOINT MOBILITY TESTING:  Good L shoulder capsular mobility.    PALPATION:  Significant L anterior/ posterior shoulder tenderness.  Trigger points noted.    TODAY'S TREATMENT:  DATE: 09/05/22  Subjective:  Pt. Arrived to PT with no new complaints.  Pt. Continues to c/o L shoulder pain/ tenderness with movement and daily tasks.  Pt. Wore tank top today so PT can  administer trigger point dry needling.    There.ex.:  Seated shoulder pulley (flexion/ abduction)- warm-up  Seated/ standing B shoulder wand ex. Insurance risk surveyor feedback)- pain limited with flexion and extension.    Standing wall ladder (marked L shoulder flexion with sticker).     Manual tx.:  Prone trigger point dry needling to L UT/ posterior deltoid region.  Several trigger points noted and marked increase in pain with OP.  Use of 4 50 mm needles with pistoning technique and addition of motor e-stim (preset)- intensity 4 mA.    Prone STM to B upper back/ cervical musculature (as tolerated).    PATIENT EDUCATION: Education details: Materials engineer Person educated: Patient Education method: Customer service manager Education comprehension: verbalized understanding and returned demonstration  HOME EXERCISE PROGRAM: Pulley ex.  ASSESSMENT:  CLINICAL IMPRESSION: Pt. Presents with marked increase in L shoulder AROM after tx. Session.  Pts. Pain/ tenderness over L posterior shoulder remains high.  Good tx. Tolerance with addition of dry needling today to L UT region.   Pt. Will continue with current HEP and instructed to move L shoulder as tolerated.  Pt. Will benefit from skilled PT services to increase L shoulder ROM/ strengthening to improve pain-free mobility.    OBJECTIVE IMPAIRMENTS: decreased mobility, decreased ROM, decreased strength, impaired flexibility, impaired UE functional use, improper body mechanics, postural dysfunction, and pain.   ACTIVITY LIMITATIONS: carrying, lifting, toileting, dressing, and reach over head  PARTICIPATION LIMITATIONS: cleaning, driving, shopping, and community activity  PERSONAL FACTORS: Fitness and Past/current experiences are also affecting patient's functional outcome.   REHAB POTENTIAL: Good  CLINICAL DECISION MAKING: Stable/uncomplicated  EVALUATION COMPLEXITY: Moderate   GOALS: Goals reviewed with patient? Yes  SHORT TERM GOALS:  Target date: 09/12/22  Pt. Independent with HEP to increase L shoulder AROM to [facility] as compared to R shoulder to improve pain-free mobility.   Baseline:  See above Goal status: INITIAL  LONG TERM GOALS: Target date: 10/10/22  Pt. Will increase FOTO to 64 to improve pain-free UE mobility.   Baseline: initial 46 Goal status: INITIAL  2.  Pt. Will increase L shoulder flexion/ abduction strength to grossly 4/5 MMT with no increase c/o pain.  Baseline: Unable to assess at eval due to pain Goal status: INITIAL  3.  Pt. Able to reach/lift 10# object overhead with no L shoulder pain/ limitations.   Baseline:  See above Goal status: INITIAL  PLAN:  PT FREQUENCY: 1-2x/week  PT DURATION: 8 weeks  PLANNED INTERVENTIONS: Therapeutic exercises, Therapeutic activity, Neuromuscular re-education, Balance training, Gait training, Patient/Family education, Self Care, Joint mobilization, Dry Needling, Electrical stimulation, Cryotherapy, and Moist heat Manual   PLAN FOR NEXT SESSION: Reassess seated L shoulder ROM/ check MMT.  Trigger point dry needling.   Pura Spice, PT, DPT # [id number] 09/05/2022, 12:41 PM

## 2022-09-07 ENCOUNTER — Encounter: Payer: Self-pay | Admitting: Physical Therapy

## 2022-09-07 ENCOUNTER — Ambulatory Visit: Payer: 59 | Admitting: Physical Therapy

## 2022-09-07 DIAGNOSIS — G8929 Other chronic pain: Secondary | ICD-10-CM

## 2022-09-07 DIAGNOSIS — M25512 Pain in left shoulder: Secondary | ICD-10-CM | POA: Diagnosis not present

## 2022-09-07 DIAGNOSIS — M6281 Muscle weakness (generalized): Secondary | ICD-10-CM

## 2022-09-07 DIAGNOSIS — M25612 Stiffness of left shoulder, not elsewhere classified: Secondary | ICD-10-CM

## 2022-09-07 NOTE — Therapy (Signed)
OUTPATIENT PHYSICAL THERAPY SHOULDER TREATMENT   Patient Name: Kelsey Bell MRN: [id number] DOB:Jun 03, 1981, 42 y.o., female Today's Date: 09/07/2022  END OF SESSION:  PT End of Session - 09/07/22 1117     Visit Number 6    Number of Visits 17    Date for PT Re-Evaluation 10/10/22    PT Start Time 7341    PT Stop Time 1201    PT Time Calculation (min) 45 min    Activity Tolerance Patient limited by pain;Patient tolerated treatment well    Behavior During Therapy [facility] for tasks assessed/performed              Past Medical History:  Diagnosis Date   Allergy    Anxiety    Arthritis    Asthma    Breast pain present for several months   Bil LT >RT across of breasts   CRPS (complex regional pain syndrome type I)    Depression    DVT (deep venous thrombosis) ([place]) 06/2018   Fibromyalgia    Kidney stones    LGSIL on Pap smear of cervix 2007   Migraine    Neuromuscular disorder ([place])    Complex regional pain syndrome and Fibromyalgia   Pancreatic pseudocyst    Pancreatitis    Past Surgical History:  Procedure Laterality Date   COLPOSCOPY  2017   neg bx   ERCP     EXTRACORPOREAL SHOCK WAVE LITHOTRIPSY Right 04/26/2017   Procedure: EXTRACORPOREAL SHOCK WAVE LITHOTRIPSY (ESWL);  Surgeon: Hollice Espy, MD;  Location: [facility] ORS;  Service: Urology;  Laterality: Right;   GASTROSTOMY-JEJEUNOSTOMY TUBE CHANGE/PLACEMENT     KNEE ARTHROSCOPY Right    nj placement     TONSILLECTOMY     UPPER ESOPHAGEAL ENDOSCOPIC ULTRASOUND (EUS)     UPPER GASTROINTESTINAL ENDOSCOPY     Patient Active Problem List   Diagnosis Date Noted   Orthostatic syncope    Dehydration 06/09/2020   Genetic testing 10/24/2018   Idiopathic acute pancreatitis 07/22/2018   DVT (deep venous thrombosis) ([place]) 06/04/2018   Acute pancreatitis 05/27/2018   Right knee pain 05/19/2018   Peripheral edema 03/05/2018   Neuropathic pain 11/02/2017   Kidney stone 04/25/2017   Ureteral stone    Migraines  12/15/2016   Morbid obesity with BMI of 45.0-49.9, adult ([place]) 10/20/2015   Asthma, moderate 05/21/2015   Polyarthralgia 12/01/2011   Fibromyalgia 09/18/2011   Low back pain 09/18/2011   PCP: Ricardo Jericho, NP  REFERRING PROVIDER: Anderson Malta, MD  REFERRING DIAG: Acute pain of left shoulder  THERAPY DIAG:  Chronic left shoulder pain  Shoulder joint stiffness, left  Muscle weakness (generalized)  Rationale for Evaluation and Treatment: Rehabilitation  ONSET DATE: 08/11/22 (fall)  SUBJECTIVE:  SUBJECTIVE STATEMENT:   EVALUATION Pt. Fell on curb/ L ankle turned resulting in L shoulder/ head injury.  Pt. Presents with ecchymosis on L forehead/ temple.  Pt. States he was assisted after fall and pulled up on L shoulder/arm resulting in increase pain.  Pt. C/o 7.5/10 L shoulder pain at rest and 3-4/10 at best with medications  PERTINENT HISTORY: Pt. Known well to PT clinic.  Chief Complaint:  Chief Complaint Patient presents with Head Injury Pt states she fell and hit her left side of head and shoulder on the concrete in a parking lot and she is having back pain x yesterday  Subjective:  Kelsey Bell is a 42 y.o. female in today for evaluation after a fall. She had a mechanical fall after she tripped in a parking lot. She hit her left shoulder, left forehead and right back. She says she has a left shoulder labral tear and did PT but now it hurts again since the fall. She says she set her alarm for every hour last night to be sure she could wake up. No nausea, vomiting.  No focal weakness or numbness.  She goes to the pain clinic. Chronic meds include Dilaudid, topamax, meloxicam. Tinazadine and tylenol. She takes these for chronic pancreatitis and CRPS and fibromyalgia.  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 Lack of Transportation (Non-Medical): Yes Physical Activity: Inactive (10/05/2021) Exercise Vital Sign Days of Exercise per Week: 0 days Minutes of Exercise per Session: 0 min Stress: Stress Concern Present (10/05/2021) [place] of Stress : Very much Social Connections: Socially Isolated (10/05/2021) Social Connection and Isolation Panel [NHANES] Frequency of Communication with Friends and Family: More than three times a week Frequency of Social Gatherings with Friends and Family: Twice a week Attends Religious Services: Never Marine scientist or Organizations: No Attends Archivist Meetings: Never Marital Status: Never married Housing Stability: [place] (10/05/2021) [place] Sign Unable to Pay for Housing in the Last Year: No Number of [place] in the Last Year: 1 Unstable Housing in the Last Year: No  ROS See HPI  Objective:  Vitals: 08/12/22 0915 BP: 134/85 Pulse: 86 Resp: 18 Temp: 36.1 C (97 F) TempSrc: Oral SpO2: 97% Weight: (!) 126.6 kg (279 lb) Height: 172.7 cm (5' 7.99") PainSc: 7 PainLoc: Generalized  Body mass index is 42.43 kg/m. Patient's last menstrual period was 07/23/2022 (exact date).  General. Alert, answering all questions appropriately, in no acute distress EYES. Sclera and conjuctiva clear NOSE. No drainage OROPHARYNX. Moist mucous membranes, No suspicious  lesions, normal dentition, no posterior pharyngeal erythema, exudate or asymmetry NECK. Supple LUNGS. Respirations unlabored CARDIOVASCULAR. Regular rate MSK tenderness to palpation right over the midline thoracic spine, can flex extend and laterally rotate/twist spine. Normal gait. Left shoulder with tenderness to palpation over anterior aspect of glenohumeral joint. Decreased range of motion with extension and abduction above 90 degrees due to pain NEUROLOGIC. Speech intact; face symmetrical; no gross neurologic abnormalities SKIN No suspicious lesions identified  No results found for this visit on 08/12/22.  Assessment/Plan: Time: 30 minutes spent  in patient facing and nonpatient facing activities for this visit  Ms. Meggison presents today for acute pain of the left forehead and left shoulder as well as right thoracic back area lateral to the midline after a mechanical fall yesterday when she tripped. Vital signs are normal. She has a normal gait. Good range of motion except with extension and abduction of her left arm above 90 degrees. Previous shoulder injury on the left side. X-rays of thoracic spine and left shoulder did not show any acute abnormality. She already has a PT she would like to see who is out of network of the [facility] system so I printed out her PT referral. She will follow-up with them in continue her chronic medicines as well as use aggressive ice on any areas that hurt.   PAIN:  Are you having pain? Yes: NPRS scale: 7.5/10 Pain location: L shoulder/ upper back Pain description: sore/ tender/ sharp Aggravating factors: palpation/ movement Relieving factors: medications  PRECAUTIONS: None  WEIGHT BEARING RESTRICTIONS: No  FALLS:  Has patient fallen in last 6 months? Yes. Number of falls 2  LIVING ENVIRONMENT: Lives with: lives alone Lives in: House/apartment Stairs: No Has following equipment at home: None  OCCUPATION: Disability  PLOF: Independent  PATIENT  GOALS: Increase L shoulder ROM/ decrease pain  NEXT MD VISIT: 12/20 (pain management).   OBJECTIVE:   DIAGNOSTIC FINDINGS:  X-ray (negative)  PATIENT SURVEYS:  FOTO initial 46/ goal 74  COGNITION: Overall cognitive status: Within functional limits for tasks assessed     SENSATION: WFL  POSTURE: Rounded shoulders in sitting/ standing  UPPER EXTREMITY ROM:   Active ROM Right eval Left eval  Shoulder flexion 142 deg 79 deg. (Pain)  Shoulder extension    Shoulder abduction 121 deg. 73 deg. (Pain)  Shoulder adduction    Shoulder internal rotation WNL 81 deg.  Shoulder external rotation WNL 76 deg.  Elbow flexion WNL WNL  Elbow extension WNL WNL  Wrist flexion WNL WNL  Wrist extension WNL WNL  Wrist ulnar deviation    Wrist radial deviation    Wrist pronation    Wrist supination    (Blank rows = not tested)  L shoulder PROM 148 deg. Flexion in supine (guarded/ pain).    Cervical spine WFL (all planes).  L elbow WFL (flexion/ extension).    UPPER EXTREMITY MMT:  R shoulder strength grossly 4/5 MMT, biceps/triceps 4+/5 MMT.  Unable to assess L shoulder at this time secondary to pain.  Grip strength: R 46.7#, L 50.4#.    SHOULDER SPECIAL TESTS: Impingement tests: Neer impingement test: positive  and Hawkins/Kennedy impingement test: positive  Rotator cuff assessment: Empty can test: negative and Full can test: negative   Difficulty assessing secondary pain with positioning.    JOINT MOBILITY TESTING:  Good L shoulder capsular mobility.    PALPATION:  Significant L anterior/ posterior shoulder tenderness.  Trigger points noted.    TODAY'S TREATMENT:  DATE: 09/07/22  Subjective:  Pt. Reports continued L UT/ posterior shoulder tenderness and discomfort with movement.  Pt. C/o L UE radicular symptoms this week.  No bruising or swelling  noted in L UT after TDN last tx.   There.ex.:  Reviewed cervical/ L shoulder AROM in seated and supine posture.    Reviewed HEP  Manual tx.:  Supine  Prone trigger point dry needling to L UT/ posterior deltoid region.  Several trigger points noted and marked increase in pain with OP/ palpation.  Use of 4 50 mm needles with pistoning technique and addition of motor e-stim (preset)- intensity 4 mA.  No muscle fasciculations noted.    Prone STM to B upper back/ cervical musculature (as tolerated).    PATIENT EDUCATION: Education details: Materials engineer Person educated: Patient Education method: Customer service manager Education comprehension: verbalized understanding and returned demonstration  HOME EXERCISE PROGRAM: Pulley ex.  ASSESSMENT:  CLINICAL IMPRESSION: Pt. Presents with marked increase in L shoulder AROM after tx. Session.  Pt. Has full L shoulder flexion 150 deg. With pain reported.  Pts. Pain/ tenderness over L posterior shoulder remains high.  Good tx. Tolerance with dry needling to L UT region.   Pt. Will continue with current HEP and instructed to move L shoulder as tolerated.  Pt. Will benefit from skilled PT services to increase L shoulder ROM/ strengthening to improve pain-free mobility.    OBJECTIVE IMPAIRMENTS: decreased mobility, decreased ROM, decreased strength, impaired flexibility, impaired UE functional use, improper body mechanics, postural dysfunction, and pain.   ACTIVITY LIMITATIONS: carrying, lifting, toileting, dressing, and reach over head  PARTICIPATION LIMITATIONS: cleaning, driving, shopping, and community activity  PERSONAL FACTORS: Fitness and Past/current experiences are also affecting patient's functional outcome.   REHAB POTENTIAL: Good  CLINICAL DECISION MAKING: Stable/uncomplicated  EVALUATION COMPLEXITY: Moderate   GOALS: Goals reviewed with patient? Yes  SHORT TERM GOALS: Target date: 09/12/22  Pt. Independent with HEP to  increase L shoulder AROM to [facility] as compared to R shoulder to improve pain-free mobility.   Baseline:  See above Goal status: INITIAL  LONG TERM GOALS: Target date: 10/10/22  Pt. Will increase FOTO to 64 to improve pain-free UE mobility.   Baseline: initial 46 Goal status: INITIAL  2.  Pt. Will increase L shoulder flexion/ abduction strength to grossly 4/5 MMT with no increase c/o pain.  Baseline: Unable to assess at eval due to pain Goal status: INITIAL  3.  Pt. Able to reach/lift 10# object overhead with no L shoulder pain/ limitations.   Baseline:  See above Goal status: INITIAL  PLAN:  PT FREQUENCY: 1-2x/week  PT DURATION: 8 weeks  PLANNED INTERVENTIONS: Therapeutic exercises, Therapeutic activity, Neuromuscular re-education, Balance training, Gait training, Patient/Family education, Self Care, Joint mobilization, Dry Needling, Electrical stimulation, Cryotherapy, and Moist heat Manual   PLAN FOR NEXT SESSION: Reassess seated L shoulder ROM/ check MMT.  Trigger point dry needling.   Pura Spice, PT, DPT # [phone] 09/07/2022, 12:27 PM

## 2022-09-12 ENCOUNTER — Ambulatory Visit: Payer: 59 | Admitting: Physical Therapy

## 2022-09-14 ENCOUNTER — Ambulatory Visit: Payer: 59 | Admitting: Physical Therapy

## 2022-09-14 DIAGNOSIS — M25512 Pain in left shoulder: Secondary | ICD-10-CM | POA: Diagnosis not present

## 2022-09-14 DIAGNOSIS — M25612 Stiffness of left shoulder, not elsewhere classified: Secondary | ICD-10-CM

## 2022-09-14 DIAGNOSIS — M6281 Muscle weakness (generalized): Secondary | ICD-10-CM

## 2022-09-14 DIAGNOSIS — G8929 Other chronic pain: Secondary | ICD-10-CM

## 2022-09-16 NOTE — Therapy (Signed)
OUTPATIENT PHYSICAL THERAPY SHOULDER TREATMENT   Patient Name: Kelsey Bell MRN: [id number] DOB:Dec 02, 1980, 42 y.o., female Today's Date: 09/14/2022  END OF SESSION:  PT End of Session - 09/16/22 1326     Visit Number 7    Number of Visits 17    Date for PT Re-Evaluation 10/10/22    PT Start Time 1031    PT Stop Time 1102    PT Time Calculation (min) 31 min    Activity Tolerance Patient limited by pain;Patient tolerated treatment well    Behavior During Therapy [facility] for tasks assessed/performed              Past Medical History:  Diagnosis Date   Allergy    Anxiety    Arthritis    Asthma    Breast pain present for several months   Bil LT >RT across of breasts   CRPS (complex regional pain syndrome type I)    Depression    DVT (deep venous thrombosis) ([place]) 06/2018   Fibromyalgia    Kidney stones    LGSIL on Pap smear of cervix 2007   Migraine    Neuromuscular disorder ([place])    Complex regional pain syndrome and Fibromyalgia   Pancreatic pseudocyst    Pancreatitis    Past Surgical History:  Procedure Laterality Date   COLPOSCOPY  2017   neg bx   ERCP     EXTRACORPOREAL SHOCK WAVE LITHOTRIPSY Right 04/26/2017   Procedure: EXTRACORPOREAL SHOCK WAVE LITHOTRIPSY (ESWL);  Surgeon: Hollice Espy, MD;  Location: [facility] ORS;  Service: Urology;  Laterality: Right;   GASTROSTOMY-JEJEUNOSTOMY TUBE CHANGE/PLACEMENT     KNEE ARTHROSCOPY Right    nj placement     TONSILLECTOMY     UPPER ESOPHAGEAL ENDOSCOPIC ULTRASOUND (EUS)     UPPER GASTROINTESTINAL ENDOSCOPY     Patient Active Problem List   Diagnosis Date Noted   Orthostatic syncope    Dehydration 06/09/2020   Genetic testing 10/24/2018   Idiopathic acute pancreatitis 07/22/2018   DVT (deep venous thrombosis) ([place]) 06/04/2018   Acute pancreatitis 05/27/2018   Right knee pain 05/19/2018   Peripheral edema 03/05/2018   Neuropathic pain 11/02/2017   Kidney stone 04/25/2017   Ureteral stone    Migraines  12/15/2016   Morbid obesity with BMI of 45.0-49.9, adult ([place]) 10/20/2015   Asthma, moderate 05/21/2015   Polyarthralgia 12/01/2011   Fibromyalgia 09/18/2011   Low back pain 09/18/2011   PCP: Ricardo Jericho, NP  REFERRING PROVIDER: Anderson Malta, MD  REFERRING DIAG: Acute pain of left shoulder  THERAPY DIAG:  Chronic left shoulder pain  Shoulder joint stiffness, left  Muscle weakness (generalized)  Rationale for Evaluation and Treatment: Rehabilitation  ONSET DATE: 08/11/22 (fall)  SUBJECTIVE:  SUBJECTIVE STATEMENT:   EVALUATION Pt. Fell on curb/ L ankle turned resulting in L shoulder/ head injury.  Pt. Presents with ecchymosis on L forehead/ temple.  Pt. States he was assisted after fall and pulled up on L shoulder/arm resulting in increase pain.  Pt. C/o 7.5/10 L shoulder pain at rest and 3-4/10 at best with medications  PERTINENT HISTORY: Pt. Known well to PT clinic.  Chief Complaint:  Chief Complaint Patient presents with Head Injury Pt states she fell and hit her left side of head and shoulder on the concrete in a parking lot and she is having back pain x yesterday  Subjective:  [name] is a 42 y.o. female in today for evaluation after a fall. She had a mechanical fall after she tripped in a parking lot. She hit her left shoulder, left forehead and right back. She says she has a left shoulder labral tear and did PT but now it hurts again since the fall. She says she set her alarm for every hour last night to be sure she could wake up. No nausea, vomiting.  No focal weakness or numbness.  She goes to the pain clinic. Chronic meds include Dilaudid, topamax, meloxicam. Tinazadine and tylenol. She takes these for chronic pancreatitis and CRPS and fibromyalgia.  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 Lack of Transportation (Non-Medical): Yes Physical Activity: Inactive (10/05/2021) Exercise Vital Sign Days of Exercise per Week: 0 days Minutes of Exercise per Session: 0 min Stress: Stress Concern Present (10/05/2021) [place] of Stress : Very much Social Connections: Socially Isolated (10/05/2021) Social Connection and Isolation Panel [NHANES] Frequency of Communication with Friends and Family: More than three times a week Frequency of Social Gatherings with Friends and Family: Twice a week Attends Religious Services: Never Marine scientist or Organizations: No Attends Archivist Meetings: Never Marital Status: Never married Housing Stability: [place] (10/05/2021) [place] Sign Unable to Pay for Housing in the Last Year: No Number of [place] in the Last Year: 1 Unstable Housing in the Last Year: No  ROS See HPI  Objective:  Vitals: 08/12/22 0915 BP: 134/85 Pulse: 86 Resp: 18 Temp: 36.1 C (97 F) TempSrc: Oral SpO2: 97% Weight: (!) 126.6 kg (279 lb) Height: 172.7 cm (5' 7.99") PainSc: 7 PainLoc: Generalized  Body mass index is 42.43 kg/m. Patient's last menstrual period was 07/23/2022 (exact date).  General. Alert, answering all questions appropriately, in no acute distress EYES. Sclera and conjuctiva clear NOSE. No drainage OROPHARYNX. Moist mucous membranes, No suspicious  lesions, normal dentition, no posterior pharyngeal erythema, exudate or asymmetry NECK. Supple LUNGS. Respirations unlabored CARDIOVASCULAR. Regular rate MSK tenderness to palpation right over the midline thoracic spine, can flex extend and laterally rotate/twist spine. Normal gait. Left shoulder with tenderness to palpation over anterior aspect of glenohumeral joint. Decreased range of motion with extension and abduction above 90 degrees due to pain NEUROLOGIC. Speech intact; face symmetrical; no gross neurologic abnormalities SKIN No suspicious lesions identified  No results found for this visit on 08/12/22.  Assessment/Plan: Time: 30 minutes spent  in patient facing and nonpatient facing activities for this visit  Ms. Qadir presents today for acute pain of the left forehead and left shoulder as well as right thoracic back area lateral to the midline after a mechanical fall yesterday when she tripped. Vital signs are normal. She has a normal gait. Good range of motion except with extension and abduction of her left arm above 90 degrees. Previous shoulder injury on the left side. X-rays of thoracic spine and left shoulder did not show any acute abnormality. She already has a PT she would like to see who is out of network of the [place] system so I printed out her PT referral. She will follow-up with them in continue her chronic medicines as well as use aggressive ice on any areas that hurt.   PAIN:  Are you having pain? Yes: NPRS scale: 7.5/10 Pain location: L shoulder/ upper back Pain description: sore/ tender/ sharp Aggravating factors: palpation/ movement Relieving factors: medications  PRECAUTIONS: None  WEIGHT BEARING RESTRICTIONS: No  FALLS:  Has patient fallen in last 6 months? Yes. Number of falls 2  LIVING ENVIRONMENT: Lives with: lives alone Lives in: House/apartment Stairs: No Has following equipment at home: None  OCCUPATION: Disability  PLOF: Independent  PATIENT  GOALS: Increase L shoulder ROM/ decrease pain  NEXT MD VISIT: 12/20 (pain management).   OBJECTIVE:   DIAGNOSTIC FINDINGS:  X-ray (negative)  PATIENT SURVEYS:  FOTO initial 46/ goal 46  COGNITION: Overall cognitive status: Within functional limits for tasks assessed     SENSATION: WFL  POSTURE: Rounded shoulders in sitting/ standing  UPPER EXTREMITY ROM:   Active ROM Right eval Left eval  Shoulder flexion 142 deg 79 deg. (Pain)  Shoulder extension    Shoulder abduction 121 deg. 73 deg. (Pain)  Shoulder adduction    Shoulder internal rotation WNL 81 deg.  Shoulder external rotation WNL 76 deg.  Elbow flexion WNL WNL  Elbow extension WNL WNL  Wrist flexion WNL WNL  Wrist extension WNL WNL  Wrist ulnar deviation    Wrist radial deviation    Wrist pronation    Wrist supination    (Blank rows = not tested)  L shoulder PROM 148 deg. Flexion in supine (guarded/ pain).    Cervical spine WFL (all planes).  L elbow WFL (flexion/ extension).    UPPER EXTREMITY MMT:  R shoulder strength grossly 4/5 MMT, biceps/triceps 4+/5 MMT.  Unable to assess L shoulder at this time secondary to pain.  Grip strength: R 46.7#, L 50.4#.    SHOULDER SPECIAL TESTS: Impingement tests: Neer impingement test: positive  and Hawkins/Kennedy impingement test: positive  Rotator cuff assessment: Empty can test: negative and Full can test: negative   Difficulty assessing secondary pain with positioning.    JOINT MOBILITY TESTING:  Good L shoulder capsular mobility.    PALPATION:  Significant L anterior/ posterior shoulder tenderness.  Trigger points noted.    TODAY'S TREATMENT:  DATE: 09/14/22  Subjective:  Pt. Reports continued L UT/ posterior shoulder tenderness and discomfort with movement.  Pt. C/o continued L UE radicular symptoms.  Pt. Scheduled for MD  appt. With GI today after PT appt. And has to leave by 11AM.    There.ex.:  Reviewed cervical/ L shoulder AROM in seated and supine posture.    Reviewed HEP  Manual tx.:  Supine  Prone trigger point dry needling to L UT/ posterior deltoid region.  Several trigger points noted and marked increase in pain with OP/ palpation.  Use of 4 0.25 x 40 mm needles with pistoning technique.  No motor e-stim today.  No muscle fasciculations noted.    Prone STM to B upper back/ cervical musculature (as tolerated).    PATIENT EDUCATION: Education details: Education administrator Person educated: Patient Education method: Medical illustrator Education comprehension: verbalized understanding and returned demonstration  HOME EXERCISE PROGRAM: Pulley ex.  ASSESSMENT:  CLINICAL IMPRESSION: Pt. Presents with marked increase in L shoulder AROM after tx. Session.  Pt. Has full L shoulder flexion 150 deg. With pain reported.  Pts. Pain/ tenderness over L posterior shoulder remains high.  Good tx. Tolerance with dry needling to L UT region.   Pt. Will continue with current HEP and instructed to move L shoulder as tolerated.  Pt. Will benefit from skilled PT services to increase L shoulder ROM/ strengthening to improve pain-free mobility.    OBJECTIVE IMPAIRMENTS: decreased mobility, decreased ROM, decreased strength, impaired flexibility, impaired UE functional use, improper body mechanics, postural dysfunction, and pain.   ACTIVITY LIMITATIONS: carrying, lifting, toileting, dressing, and reach over head  PARTICIPATION LIMITATIONS: cleaning, driving, shopping, and community activity  PERSONAL FACTORS: Fitness and Past/current experiences are also affecting patient's functional outcome.   REHAB POTENTIAL: Good  CLINICAL DECISION MAKING: Stable/uncomplicated  EVALUATION COMPLEXITY: Moderate   GOALS: Goals reviewed with patient? Yes  SHORT TERM GOALS: Target date: 09/12/22  Pt. Independent with HEP  to increase L shoulder AROM to [place] as compared to R shoulder to improve pain-free mobility.   Baseline:  See above Goal status: Partially met  LONG TERM GOALS: Target date: 10/10/22  Pt. Will increase FOTO to 64 to improve pain-free UE mobility.   Baseline: initial 46 Goal status: INITIAL  2.  Pt. Will increase L shoulder flexion/ abduction strength to grossly 4/5 MMT with no increase c/o pain.  Baseline: Unable to assess at eval due to pain Goal status: INITIAL  3.  Pt. Able to reach/lift 10# object overhead with no L shoulder pain/ limitations.   Baseline:  See above Goal status: INITIAL  PLAN:  PT FREQUENCY: 1-2x/week  PT DURATION: 8 weeks  PLANNED INTERVENTIONS: Therapeutic exercises, Therapeutic activity, Neuromuscular re-education, Balance training, Gait training, Patient/Family education, Self Care, Joint mobilization, Dry Needling, Electrical stimulation, Cryotherapy, and Moist heat Manual   PLAN FOR NEXT SESSION: Reassess seated L shoulder ROM/ check MMT.  Trigger point dry needling.  Discuss GI appt.   Cammie Mcgee, PT, DPT # [id number] 09/16/2022, 1:29 PM

## 2022-09-18 ENCOUNTER — Ambulatory Visit: Payer: 59 | Admitting: Physical Therapy

## 2022-09-18 DIAGNOSIS — M25512 Pain in left shoulder: Secondary | ICD-10-CM | POA: Diagnosis not present

## 2022-09-18 DIAGNOSIS — M5441 Lumbago with sciatica, right side: Secondary | ICD-10-CM

## 2022-09-18 DIAGNOSIS — G8929 Other chronic pain: Secondary | ICD-10-CM

## 2022-09-18 DIAGNOSIS — M25572 Pain in left ankle and joints of left foot: Secondary | ICD-10-CM

## 2022-09-18 DIAGNOSIS — M6281 Muscle weakness (generalized): Secondary | ICD-10-CM

## 2022-09-18 DIAGNOSIS — M25612 Stiffness of left shoulder, not elsewhere classified: Secondary | ICD-10-CM

## 2022-09-18 NOTE — Therapy (Signed)
OUTPATIENT PHYSICAL THERAPY SHOULDER TREATMENT   Patient Name: Kelsey Bell MRN: [id number] DOB:13-Jul-1981, 42 y.o., female Today's Date: 09/18/2022  END OF SESSION:  PT End of Session - 09/18/22 1543     Visit Number 8    Number of Visits 17    Date for PT Re-Evaluation 10/10/22    PT Start Time 3825    PT Stop Time 1127    PT Time Calculation (min) 43 min    Activity Tolerance Patient limited by pain;Patient tolerated treatment well    Behavior During Therapy [facility] for tasks assessed/performed              Past Medical History:  Diagnosis Date   Allergy    Anxiety    Arthritis    Asthma    Breast pain present for several months   Bil LT >RT across of breasts   CRPS (complex regional pain syndrome type I)    Depression    DVT (deep venous thrombosis) ([place]) 06/2018   Fibromyalgia    Kidney stones    LGSIL on Pap smear of cervix 2007   Migraine    Neuromuscular disorder ([place])    Complex regional pain syndrome and Fibromyalgia   Pancreatic pseudocyst    Pancreatitis    Past Surgical History:  Procedure Laterality Date   COLPOSCOPY  2017   neg bx   ERCP     EXTRACORPOREAL SHOCK WAVE LITHOTRIPSY Right 04/26/2017   Procedure: EXTRACORPOREAL SHOCK WAVE LITHOTRIPSY (ESWL);  Surgeon: Hollice Espy, MD;  Location: [facility] ORS;  Service: Urology;  Laterality: Right;   GASTROSTOMY-JEJEUNOSTOMY TUBE CHANGE/PLACEMENT     KNEE ARTHROSCOPY Right    nj placement     TONSILLECTOMY     UPPER ESOPHAGEAL ENDOSCOPIC ULTRASOUND (EUS)     UPPER GASTROINTESTINAL ENDOSCOPY     Patient Active Problem List   Diagnosis Date Noted   Orthostatic syncope    Dehydration 06/09/2020   Genetic testing 10/24/2018   Idiopathic acute pancreatitis 07/22/2018   DVT (deep venous thrombosis) ([place]) 06/04/2018   Acute pancreatitis 05/27/2018   Right knee pain 05/19/2018   Peripheral edema 03/05/2018   Neuropathic pain 11/02/2017   Kidney stone 04/25/2017   Ureteral stone    Migraines  12/15/2016   Morbid obesity with BMI of 45.0-49.9, adult ([place]) 10/20/2015   Asthma, moderate 05/21/2015   Polyarthralgia 12/01/2011   Fibromyalgia 09/18/2011   Low back pain 09/18/2011   PCP: Ricardo Jericho, NP  REFERRING PROVIDER: Anderson Malta, MD  REFERRING DIAG: Acute pain of left shoulder  THERAPY DIAG:  Chronic left shoulder pain  Shoulder joint stiffness, left  Muscle weakness (generalized)  Pain in left ankle and joints of left foot  Acute bilateral low back pain with bilateral sciatica  Rationale for Evaluation and Treatment: Rehabilitation  ONSET DATE: 08/11/22 (fall)  SUBJECTIVE:  SUBJECTIVE STATEMENT:   EVALUATION Pt. Fell on curb/ L ankle turned resulting in L shoulder/ head injury.  Pt. Presents with ecchymosis on L forehead/ temple.  Pt. States he was assisted after fall and pulled up on L shoulder/arm resulting in increase pain.  Pt. C/o 7.5/10 L shoulder pain at rest and 3-4/10 at best with medications  PERTINENT HISTORY: Pt. Known well to PT clinic.  Chief Complaint:  Chief Complaint Patient presents with Head Injury Pt states she fell and hit her left side of head and shoulder on the concrete in a parking lot and she is having back pain x yesterday  Subjective:  Kelsey Bell is a 41 y.o. female in today for evaluation after a fall. She had a mechanical fall after she tripped in a parking lot. She hit her left shoulder, left forehead and right back. She says she has a left shoulder labral tear and did PT but now it hurts again since the fall. She says she set her alarm for every hour last night to be sure she could wake up. No nausea, vomiting.  No focal weakness or numbness.  She goes to the pain clinic. Chronic meds include Dilaudid, topamax, meloxicam.  Tinazadine and tylenol. She takes these for chronic pancreatitis and CRPS and fibromyalgia.  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): Yes Lack of Transportation (Non-Medical): Yes Physical Activity: Inactive (10/05/2021) Exercise Vital Sign Days of Exercise per Week: 0 days Minutes of Exercise per Session: 0 min Stress: Stress Concern Present (10/05/2021) [place] of Stress : Very much Social Connections: Socially Isolated (10/05/2021) Social Connection and Isolation Panel [NHANES] Frequency of Communication with Friends and Family: More than three times a week Frequency of Social Gatherings with Friends and Family: Twice a week Attends Religious Services: Never Marine scientist or Organizations: No Attends Archivist Meetings: Never Marital Status: Never married Housing Stability: [place] (10/05/2021) [place] Sign Unable to Pay for Housing in the Last Year: No Number of [place] in the Last Year: 1 Unstable Housing in the Last Year: No  ROS See HPI  Objective:  Vitals: 08/12/22 0915 BP: 134/85 Pulse: 86 Resp: 18 Temp: 36.1 C (97 F) TempSrc: Oral SpO2: 97% Weight: (!) 126.6 kg (279 lb) Height: 172.7 cm (5' 7.99") PainSc: 7 PainLoc: Generalized  Body mass index is 42.43 kg/m. Patient's last menstrual period was 07/23/2022 (exact date).  General. Alert, answering all questions appropriately, in no acute distress EYES.  Sclera and conjuctiva clear NOSE. No drainage OROPHARYNX. Moist mucous membranes, No suspicious lesions, normal dentition, no posterior pharyngeal erythema, exudate or asymmetry NECK. Supple LUNGS. Respirations unlabored CARDIOVASCULAR. Regular rate MSK tenderness to palpation right over the midline thoracic spine, can flex extend and laterally rotate/twist spine. Normal gait. Left shoulder with tenderness to palpation over anterior aspect of glenohumeral joint. Decreased range of motion with extension and abduction above 90 degrees due to pain NEUROLOGIC. Speech intact; face symmetrical; no gross neurologic abnormalities SKIN No suspicious lesions identified  No results found for this visit on 08/12/22.  Assessment/Plan: Time: 30 minutes spent  in patient facing and nonpatient facing activities for this visit  Ms. Lynam presents today for acute pain of the left forehead and left shoulder as well as right thoracic back area lateral to the midline after a mechanical fall yesterday when she tripped. Vital signs are normal. She has a normal gait. Good range of motion except with extension and abduction of her left arm above 90 degrees. Previous shoulder injury on the left side. X-rays of thoracic spine and left shoulder did not show any acute abnormality. She already has a PT she would like to see who is out of network of the [facility] system so I printed out her PT referral. She will follow-up with them in continue her chronic medicines as well as use aggressive ice on any areas that hurt.   PAIN:  Are you having pain? Yes: NPRS scale: 7.5/10 Pain location: L shoulder/ upper back Pain description: sore/ tender/ sharp Aggravating factors: palpation/ movement Relieving factors: medications  PRECAUTIONS: None  WEIGHT BEARING RESTRICTIONS: No  FALLS:  Has patient fallen in last 6 months? Yes. Number of falls 2  LIVING ENVIRONMENT: Lives with: lives alone Lives in: House/apartment Stairs:  No Has following equipment at home: None  OCCUPATION: Disability  PLOF: Independent  PATIENT GOALS: Increase L shoulder ROM/ decrease pain  NEXT MD VISIT: 12/20 (pain management).   OBJECTIVE:   DIAGNOSTIC FINDINGS:  X-ray (negative)  PATIENT SURVEYS:  FOTO initial 46/ goal 49  COGNITION: Overall cognitive status: Within functional limits for tasks assessed     SENSATION: WFL  POSTURE: Rounded shoulders in sitting/ standing  UPPER EXTREMITY ROM:   Active ROM Right eval Left eval  Shoulder flexion 142 deg 79 deg. (Pain)  Shoulder extension    Shoulder abduction 121 deg. 73 deg. (Pain)  Shoulder adduction    Shoulder internal rotation WNL 81 deg.  Shoulder external rotation WNL 76 deg.  Elbow flexion WNL WNL  Elbow extension WNL WNL  Wrist flexion WNL WNL  Wrist extension WNL WNL  Wrist ulnar deviation    Wrist radial deviation    Wrist pronation    Wrist supination    (Blank rows = not tested)  L shoulder PROM 148 deg. Flexion in supine (guarded/ pain).    Cervical spine WFL (all planes).  L elbow WFL (flexion/ extension).    UPPER EXTREMITY MMT:  R shoulder strength grossly 4/5 MMT, biceps/triceps 4+/5 MMT.  Unable to assess L shoulder at this time secondary to pain.  Grip strength: R 46.7#, L 50.4#.    SHOULDER SPECIAL TESTS: Impingement tests: Neer impingement test: positive  and Hawkins/Kennedy impingement test: positive  Rotator cuff assessment: Empty can test: negative and Full can test: negative   Difficulty assessing secondary pain with positioning.    JOINT MOBILITY TESTING:  Good L shoulder capsular mobility.    PALPATION:  Significant L anterior/ posterior shoulder tenderness.  Trigger points noted.    TODAY'S TREATMENT:  DATE: 09/18/22  Subjective:  Pt. Reports continued L UT/ posterior shoulder  tenderness and discomfort with movement.  Pt. States her neck is stiff today and demonstrates limited c-spine rotn.   Pt. Scheduled to have GI tube removed next Monday (1/22).    There.ex.:  Reviewed cervical/ L shoulder AROM in seated and supine posture.    Manual tx.:  MH to L upper back/ L UT/ posterior shoulder prior to prone manual tx.    Supine L/R UT and rotn. C-spine stretches 3x each with manual holds (as tolerated).  STM to L UT/ trigger point release technique (increase static holds).  Prone trigger point dry needling to L UT/ posterior deltoid region.  Several trigger points noted and marked increase in pain with OP/ palpation.  Use of 5 0.25 x 40 mm needles with pistoning technique.  No motor e-stim today.  No muscle fasciculations noted.    Prone STM to B upper back/ cervical musculature (as tolerated).    PATIENT EDUCATION: Education details: Materials engineer Person educated: Patient Education method: Customer service manager Education comprehension: verbalized understanding and returned demonstration  HOME EXERCISE PROGRAM: Pulley ex.  ASSESSMENT:  CLINICAL IMPRESSION: Pt. Presents with limited cervical rotn. Prior to tx. And marked increase with manual stretches (pain tolerable).  Pts. Pain/ tenderness over L posterior shoulder remains high.  Good tx. Tolerance with dry needling to L UT region.   Pt. Will continue with current HEP and instructed to move L shoulder as tolerated.  Pt. Will benefit from skilled PT services to increase L shoulder ROM/ strengthening to improve pain-free mobility.    OBJECTIVE IMPAIRMENTS: decreased mobility, decreased ROM, decreased strength, impaired flexibility, impaired UE functional use, improper body mechanics, postural dysfunction, and pain.   ACTIVITY LIMITATIONS: carrying, lifting, toileting, dressing, and reach over head  PARTICIPATION LIMITATIONS: cleaning, driving, shopping, and community activity  PERSONAL FACTORS: Fitness  and Past/current experiences are also affecting patient's functional outcome.   REHAB POTENTIAL: Good  CLINICAL DECISION MAKING: Stable/uncomplicated  EVALUATION COMPLEXITY: Moderate   GOALS: Goals reviewed with patient? Yes  SHORT TERM GOALS: Target date: 09/12/22  Pt. Independent with HEP to increase L shoulder AROM to [facility] as compared to R shoulder to improve pain-free mobility.   Baseline:  See above Goal status: Partially met  LONG TERM GOALS: Target date: 10/10/22  Pt. Will increase FOTO to 64 to improve pain-free UE mobility.   Baseline: initial 46 Goal status: INITIAL  2.  Pt. Will increase L shoulder flexion/ abduction strength to grossly 4/5 MMT with no increase c/o pain.  Baseline: Unable to assess at eval due to pain Goal status: INITIAL  3.  Pt. Able to reach/lift 10# object overhead with no L shoulder pain/ limitations.   Baseline:  See above Goal status: INITIAL  PLAN:  PT FREQUENCY: 1-2x/week  PT DURATION: 8 weeks  PLANNED INTERVENTIONS: Therapeutic exercises, Therapeutic activity, Neuromuscular re-education, Balance training, Gait training, Patient/Family education, Self Care, Joint mobilization, Dry Needling, Electrical stimulation, Cryotherapy, and Moist heat Manual   PLAN FOR NEXT SESSION: Reassess seated L shoulder ROM/ check MMT.  Trigger point dry needling.    Pura Spice, PT, DPT # [phone] 09/18/2022, 3:47 PM

## 2022-09-19 ENCOUNTER — Encounter: Payer: Medicare Other | Admitting: Physical Therapy

## 2022-09-21 ENCOUNTER — Ambulatory Visit: Payer: 59 | Admitting: Physical Therapy

## 2022-09-21 DIAGNOSIS — M6281 Muscle weakness (generalized): Secondary | ICD-10-CM

## 2022-09-21 DIAGNOSIS — M25512 Pain in left shoulder: Secondary | ICD-10-CM | POA: Diagnosis not present

## 2022-09-21 DIAGNOSIS — G8929 Other chronic pain: Secondary | ICD-10-CM

## 2022-09-21 DIAGNOSIS — M25612 Stiffness of left shoulder, not elsewhere classified: Secondary | ICD-10-CM

## 2022-09-26 ENCOUNTER — Ambulatory Visit: Payer: 59 | Admitting: Physical Therapy

## 2022-09-26 DIAGNOSIS — M6281 Muscle weakness (generalized): Secondary | ICD-10-CM

## 2022-09-26 DIAGNOSIS — M25512 Pain in left shoulder: Secondary | ICD-10-CM | POA: Diagnosis not present

## 2022-09-26 DIAGNOSIS — M25612 Stiffness of left shoulder, not elsewhere classified: Secondary | ICD-10-CM

## 2022-09-26 DIAGNOSIS — G8929 Other chronic pain: Secondary | ICD-10-CM

## 2022-09-28 ENCOUNTER — Encounter: Payer: Medicare Other | Admitting: Physical Therapy

## 2022-09-30 NOTE — Therapy (Signed)
OUTPATIENT PHYSICAL THERAPY SHOULDER TREATMENT   Patient Name: Kelsey Bell MRN: [id number] DOB:19-Aug-1981, 42 y.o., female Today's Date: 09/21/2022  END OF SESSION:  PT End of Session - 09/30/22 1221     Visit Number 9    Number of Visits 17    Date for PT Re-Evaluation 10/10/22    PT Start Time 1040    PT Stop Time 1134    PT Time Calculation (min) 54 min    Activity Tolerance Patient limited by pain;Patient tolerated treatment well    Behavior During Therapy [facility] for tasks assessed/performed              Past Medical History:  Diagnosis Date   Allergy    Anxiety    Arthritis    Asthma    Breast pain present for several months   Bil LT >RT across of breasts   CRPS (complex regional pain syndrome type I)    Depression    DVT (deep venous thrombosis) (HCC) 06/2018   Fibromyalgia    Kidney stones    LGSIL on Pap smear of cervix 2007   Migraine    Neuromuscular disorder (HCC)    Complex regional pain syndrome and Fibromyalgia   Pancreatic pseudocyst    Pancreatitis    Past Surgical History:  Procedure Laterality Date   COLPOSCOPY  2017   neg bx   ERCP     EXTRACORPOREAL SHOCK WAVE LITHOTRIPSY Right 04/26/2017   Procedure: EXTRACORPOREAL SHOCK WAVE LITHOTRIPSY (ESWL);  Surgeon: Vanna Scotland, MD;  Location: [facility] ORS;  Service: Urology;  Laterality: Right;   GASTROSTOMY-JEJEUNOSTOMY TUBE CHANGE/PLACEMENT     KNEE ARTHROSCOPY Right    nj placement     TONSILLECTOMY     UPPER ESOPHAGEAL ENDOSCOPIC ULTRASOUND (EUS)     UPPER GASTROINTESTINAL ENDOSCOPY     Patient Active Problem List   Diagnosis Date Noted   Orthostatic syncope    Dehydration 06/09/2020   Genetic testing 10/24/2018   Idiopathic acute pancreatitis 07/22/2018   DVT (deep venous thrombosis) (HCC) 06/04/2018   Acute pancreatitis 05/27/2018   Right knee pain 05/19/2018   Peripheral edema 03/05/2018   Neuropathic pain 11/02/2017   Kidney stone 04/25/2017   Ureteral stone    Migraines  12/15/2016   Morbid obesity with BMI of 45.0-49.9, adult (HCC) 10/20/2015   Asthma, moderate 05/21/2015   Polyarthralgia 12/01/2011   Fibromyalgia 09/18/2011   Low back pain 09/18/2011   PCP: Titus Mould, NP  REFERRING PROVIDER: Virgina Organ, MD  REFERRING DIAG: Acute pain of left shoulder  THERAPY DIAG:  Chronic left shoulder pain  Shoulder joint stiffness, left  Muscle weakness (generalized)  Rationale for Evaluation and Treatment: Rehabilitation  ONSET DATE: 08/11/22 (fall)  SUBJECTIVE:  SUBJECTIVE STATEMENT:   EVALUATION Pt. Fell on curb/ L ankle turned resulting in L shoulder/ head injury.  Pt. Presents with ecchymosis on L forehead/ temple.  Pt. States he was assisted after fall and pulled up on L shoulder/arm resulting in increase pain.  Pt. C/o 7.5/10 L shoulder pain at rest and 3-4/10 at best with medications  PERTINENT HISTORY: Pt. Known well to PT clinic.  Chief Complaint:  Chief Complaint Patient presents with Head Injury Pt states she fell and hit her left side of head and shoulder on the concrete in a parking lot and she is having back pain x yesterday  Subjective:  Kelsey Bell is a 42 y.o. female in today for evaluation after a fall. She had a mechanical fall after she tripped in a parking lot. She hit her left shoulder, left forehead and right back. She says she has a left shoulder labral tear and did PT but now it hurts again since the fall. She says she set her alarm for every hour last night to be sure she could wake up. No nausea, vomiting.  No focal weakness or numbness.  She goes to the pain clinic. Chronic meds include Dilaudid, topamax, meloxicam. Tinazadine and tylenol. She takes these for chronic pancreatitis and CRPS and fibromyalgia.  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 Lack of Transportation (Non-Medical): Yes Physical Activity: Inactive (10/05/2021) Exercise Vital Sign Days of Exercise per Week: 0 days Minutes of Exercise per Session: 0 min Stress: Stress Concern Present (10/05/2021) [place] of Stress : Very much Social Connections: Socially Isolated (10/05/2021) Social Connection and Isolation Panel [NHANES] Frequency of Communication with Friends and Family: More than three times a week Frequency of Social Gatherings with Friends and Family: Twice a week Attends Religious Services: Never Marine scientist or Organizations: No Attends Archivist Meetings: Never Marital Status: Never married Housing Stability: [place] (10/05/2021) [place] Sign Unable to Pay for Housing in the Last Year: No Number of [place] in the Last Year: 1 Unstable Housing in the Last Year: No  ROS See HPI  Objective:  Vitals: 08/12/22 0915 BP: 134/85 Pulse: 86 Resp: 18 Temp: 36.1 C (97 F) TempSrc: Oral SpO2: 97% Weight: (!) 126.6 kg (279 lb) Height: 172.7 cm (5' 7.99") PainSc: 7 PainLoc: Generalized  Body mass index is 42.43 kg/m. Patient's last menstrual period was 07/23/2022 (exact date).  General. Alert, answering all questions appropriately, in no acute distress EYES. Sclera and conjuctiva clear NOSE. No drainage OROPHARYNX. Moist mucous membranes, No suspicious  lesions, normal dentition, no posterior pharyngeal erythema, exudate or asymmetry NECK. Supple LUNGS. Respirations unlabored CARDIOVASCULAR. Regular rate MSK tenderness to palpation right over the midline thoracic spine, can flex extend and laterally rotate/twist spine. Normal gait. Left shoulder with tenderness to palpation over anterior aspect of glenohumeral joint. Decreased range of motion with extension and abduction above 90 degrees due to pain NEUROLOGIC. Speech intact; face symmetrical; no gross neurologic abnormalities SKIN No suspicious lesions identified  No results found for this visit on 08/12/22.  Assessment/Plan: Time: 30 minutes spent  in patient facing and nonpatient facing activities for this visit  Ms. Meggison presents today for acute pain of the left forehead and left shoulder as well as right thoracic back area lateral to the midline after a mechanical fall yesterday when she tripped. Vital signs are normal. She has a normal gait. Good range of motion except with extension and abduction of her left arm above 90 degrees. Previous shoulder injury on the left side. X-rays of thoracic spine and left shoulder did not show any acute abnormality. She already has a PT she would like to see who is out of network of the [facility] system so I printed out her PT referral. She will follow-up with them in continue her chronic medicines as well as use aggressive ice on any areas that hurt.   PAIN:  Are you having pain? Yes: NPRS scale: 7.5/10 Pain location: L shoulder/ upper back Pain description: sore/ tender/ sharp Aggravating factors: palpation/ movement Relieving factors: medications  PRECAUTIONS: None  WEIGHT BEARING RESTRICTIONS: No  FALLS:  Has patient fallen in last 6 months? Yes. Number of falls 2  LIVING ENVIRONMENT: Lives with: lives alone Lives in: House/apartment Stairs: No Has following equipment at home: None  OCCUPATION: Disability  PLOF: Independent  PATIENT  GOALS: Increase L shoulder ROM/ decrease pain  NEXT MD VISIT: 12/20 (pain management).   OBJECTIVE:   DIAGNOSTIC FINDINGS:  X-ray (negative)  PATIENT SURVEYS:  FOTO initial 46/ goal 74  COGNITION: Overall cognitive status: Within functional limits for tasks assessed     SENSATION: WFL  POSTURE: Rounded shoulders in sitting/ standing  UPPER EXTREMITY ROM:   Active ROM Right eval Left eval  Shoulder flexion 142 deg 79 deg. (Pain)  Shoulder extension    Shoulder abduction 121 deg. 73 deg. (Pain)  Shoulder adduction    Shoulder internal rotation WNL 81 deg.  Shoulder external rotation WNL 76 deg.  Elbow flexion WNL WNL  Elbow extension WNL WNL  Wrist flexion WNL WNL  Wrist extension WNL WNL  Wrist ulnar deviation    Wrist radial deviation    Wrist pronation    Wrist supination    (Blank rows = not tested)  L shoulder PROM 148 deg. Flexion in supine (guarded/ pain).    Cervical spine WFL (all planes).  L elbow WFL (flexion/ extension).    UPPER EXTREMITY MMT:  R shoulder strength grossly 4/5 MMT, biceps/triceps 4+/5 MMT.  Unable to assess L shoulder at this time secondary to pain.  Grip strength: R 46.7#, L 50.4#.    SHOULDER SPECIAL TESTS: Impingement tests: Neer impingement test: positive  and Hawkins/Kennedy impingement test: positive  Rotator cuff assessment: Empty can test: negative and Full can test: negative   Difficulty assessing secondary pain with positioning.    JOINT MOBILITY TESTING:  Good L shoulder capsular mobility.    PALPATION:  Significant L anterior/ posterior shoulder tenderness.  Trigger points noted.    TODAY'S TREATMENT:  DATE: 09/21/22  Subjective:  Pt. Scheduled to have GI tube removed next Monday (1/22).  Pt. Reports continued L UT/ posterior shoulder tenderness and discomfort with movement.    There.ex.:  Reviewed cervical/ L shoulder AROM in seated and supine posture.    Manual tx.:  MH to L upper back/ L UT/ posterior shoulder prior to prone manual tx.    Supine L/R UT and rotn. C-spine stretches 3x each with manual holds (as tolerated).  STM to L UT/ trigger point release technique (increase static holds).  Prone trigger point dry needling to L UT/ posterior deltoid region.  Several trigger points noted and marked increase in pain with OP/ palpation.  Use of 6 0.25 x 40 mm needles with pistoning technique.  1 muscle fasciculation noted in L UT region.  Good tx. Tolerance.  Use of Biofreeze after tx.     Prone STM to B upper back/ cervical musculature (as tolerated).    PATIENT EDUCATION: Education details: Materials engineer Person educated: Patient Education method: Customer service manager Education comprehension: verbalized understanding and returned demonstration  HOME EXERCISE PROGRAM: Pulley ex.  ASSESSMENT:  CLINICAL IMPRESSION: Pt. Presents with overall increase in cervical AROM with pain at end-range lateral flexion.  Pts. Pain/ tenderness over L posterior shoulder remains with 2 trigger points noted.   Good tx. Tolerance with dry needling to L UT region.   Pt. Will continue with current HEP and instructed to move L shoulder as tolerated.  Pt. Will benefit from skilled PT services to increase L shoulder ROM/ strengthening to improve pain-free mobility.    OBJECTIVE IMPAIRMENTS: decreased mobility, decreased ROM, decreased strength, impaired flexibility, impaired UE functional use, improper body mechanics, postural dysfunction, and pain.   ACTIVITY LIMITATIONS: carrying, lifting, toileting, dressing, and reach over head  PARTICIPATION LIMITATIONS: cleaning, driving, shopping, and community activity  PERSONAL FACTORS: Fitness and Past/current experiences are also affecting patient's functional outcome.   REHAB POTENTIAL: Good  CLINICAL DECISION MAKING:  Stable/uncomplicated  EVALUATION COMPLEXITY: Moderate   GOALS: Goals reviewed with patient? Yes  SHORT TERM GOALS: Target date: 09/12/22  Pt. Independent with HEP to increase L shoulder AROM to [facility] as compared to R shoulder to improve pain-free mobility.   Baseline:  See above Goal status: Partially met  LONG TERM GOALS: Target date: 10/10/22  Pt. Will increase FOTO to 64 to improve pain-free UE mobility.   Baseline: initial 46 Goal status: INITIAL  2.  Pt. Will increase L shoulder flexion/ abduction strength to grossly 4/5 MMT with no increase c/o pain.  Baseline: Unable to assess at eval due to pain Goal status: INITIAL  3.  Pt. Able to reach/lift 10# object overhead with no L shoulder pain/ limitations.   Baseline:  See above Goal status: INITIAL  PLAN:  PT FREQUENCY: 1-2x/week  PT DURATION: 8 weeks  PLANNED INTERVENTIONS: Therapeutic exercises, Therapeutic activity, Neuromuscular re-education, Balance training, Gait training, Patient/Family education, Self Care, Joint mobilization, Dry Needling, Electrical stimulation, Cryotherapy, and Moist heat Manual   PLAN FOR NEXT SESSION: Reassess seated L shoulder ROM/ check MMT.  Trigger point dry needling.    Pura Spice, PT, DPT # [id number] 09/30/2022, 12:23 PM

## 2022-09-30 NOTE — Therapy (Signed)
OUTPATIENT PHYSICAL THERAPY SHOULDER TREATMENT   Patient Name: Kelsey Bell MRN: [id number] DOB:1981-01-06, 42 y.o., female Today's Date: 09/26/2022  END OF SESSION:  PT End of Session - 09/30/22 1454     Visit Number 10    Number of Visits 17    Date for PT Re-Evaluation 10/10/22    PT Start Time 1033    PT Stop Time 6967    PT Time Calculation (min) 45 min    Activity Tolerance Patient limited by pain;Patient tolerated treatment well    Behavior During Therapy [facility] for tasks assessed/performed              Past Medical History:  Diagnosis Date   Allergy    Anxiety    Arthritis    Asthma    Breast pain present for several months   Bil LT >RT across of breasts   CRPS (complex regional pain syndrome type I)    Depression    DVT (deep venous thrombosis) ([place]) 06/2018   Fibromyalgia    Kidney stones    LGSIL on Pap smear of cervix 2007   Migraine    Neuromuscular disorder ([place])    Complex regional pain syndrome and Fibromyalgia   Pancreatic pseudocyst    Pancreatitis    Past Surgical History:  Procedure Laterality Date   COLPOSCOPY  2017   neg bx   ERCP     EXTRACORPOREAL SHOCK WAVE LITHOTRIPSY Right 04/26/2017   Procedure: EXTRACORPOREAL SHOCK WAVE LITHOTRIPSY (ESWL);  Surgeon: Hollice Espy, MD;  Location: [facility] ORS;  Service: Urology;  Laterality: Right;   GASTROSTOMY-JEJEUNOSTOMY TUBE CHANGE/PLACEMENT     KNEE ARTHROSCOPY Right    nj placement     TONSILLECTOMY     UPPER ESOPHAGEAL ENDOSCOPIC ULTRASOUND (EUS)     UPPER GASTROINTESTINAL ENDOSCOPY     Patient Active Problem List   Diagnosis Date Noted   Orthostatic syncope    Dehydration 06/09/2020   Genetic testing 10/24/2018   Idiopathic acute pancreatitis 07/22/2018   DVT (deep venous thrombosis) ([place]) 06/04/2018   Acute pancreatitis 05/27/2018   Right knee pain 05/19/2018   Peripheral edema 03/05/2018   Neuropathic pain 11/02/2017   Kidney stone 04/25/2017   Ureteral stone    Migraines  12/15/2016   Morbid obesity with BMI of 45.0-49.9, adult ([place]) 10/20/2015   Asthma, moderate 05/21/2015   Polyarthralgia 12/01/2011   Fibromyalgia 09/18/2011   Low back pain 09/18/2011   PCP: Ricardo Jericho, NP  REFERRING PROVIDER: Anderson Malta, MD  REFERRING DIAG: Acute pain of left shoulder  THERAPY DIAG:  Chronic left shoulder pain  Shoulder joint stiffness, left  Muscle weakness (generalized)  Rationale for Evaluation and Treatment: Rehabilitation  ONSET DATE: 08/11/22 (fall)  SUBJECTIVE:  SUBJECTIVE STATEMENT:   EVALUATION Pt. Fell on curb/ L ankle turned resulting in L shoulder/ head injury.  Pt. Presents with ecchymosis on L forehead/ temple.  Pt. States he was assisted after fall and pulled up on L shoulder/arm resulting in increase pain.  Pt. C/o 7.5/10 L shoulder pain at rest and 3-4/10 at best with medications  PERTINENT HISTORY: Pt. Known well to PT clinic.  Chief Complaint:  Chief Complaint Patient presents with Head Injury Pt states she fell and hit her left side of head and shoulder on the concrete in a parking lot and she is having back pain x yesterday  Subjective:  Kelsey Bell is a 42 y.o. female in today for evaluation after a fall. She had a mechanical fall after she tripped in a parking lot. She hit her left shoulder, left forehead and right back. She says she has a left shoulder labral tear and did PT but now it hurts again since the fall. She says she set her alarm for every hour last night to be sure she could wake up. No nausea, vomiting.  No focal weakness or numbness.  She goes to the pain clinic. Chronic meds include Dilaudid, topamax, meloxicam. Tinazadine and tylenol. She takes these for chronic pancreatitis and CRPS and fibromyalgia.  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 Lack of Transportation (Non-Medical): Yes Physical Activity: Inactive (10/05/2021) Exercise Vital Sign Days of Exercise per Week: 0 days Minutes of Exercise per Session: 0 min Stress: Stress Concern Present (10/05/2021) [place] of Stress : Very much Social Connections: Socially Isolated (10/05/2021) Social Connection and Isolation Panel [NHANES] Frequency of Communication with Friends and Family: More than three times a week Frequency of Social Gatherings with Friends and Family: Twice a week Attends Religious Services: Never Marine scientist or Organizations: No Attends Archivist Meetings: Never Marital Status: Never married Housing Stability: [place] (10/05/2021) [place] Sign Unable to Pay for Housing in the Last Year: No Number of [place] in the Last Year: 1 Unstable Housing in the Last Year: No  ROS See HPI  Objective:  Vitals: 08/12/22 0915 BP: 134/85 Pulse: 86 Resp: 18 Temp: 36.1 C (97 F) TempSrc: Oral SpO2: 97% Weight: (!) 126.6 kg (279 lb) Height: 172.7 cm (5' 7.99") PainSc: 7 PainLoc: Generalized  Body mass index is 42.43 kg/m. Patient's last menstrual period was 07/23/2022 (exact date).  General. Alert, answering all questions appropriately, in no acute distress EYES. Sclera and conjuctiva clear NOSE. No drainage OROPHARYNX. Moist mucous membranes, No suspicious  lesions, normal dentition, no posterior pharyngeal erythema, exudate or asymmetry NECK. Supple LUNGS. Respirations unlabored CARDIOVASCULAR. Regular rate MSK tenderness to palpation right over the midline thoracic spine, can flex extend and laterally rotate/twist spine. Normal gait. Left shoulder with tenderness to palpation over anterior aspect of glenohumeral joint. Decreased range of motion with extension and abduction above 90 degrees due to pain NEUROLOGIC. Speech intact; face symmetrical; no gross neurologic abnormalities SKIN No suspicious lesions identified  No results found for this visit on 08/12/22.  Assessment/Plan: Time: 30 minutes spent  in patient facing and nonpatient facing activities for this visit  Ms. Meggison presents today for acute pain of the left forehead and left shoulder as well as right thoracic back area lateral to the midline after a mechanical fall yesterday when she tripped. Vital signs are normal. She has a normal gait. Good range of motion except with extension and abduction of her left arm above 90 degrees. Previous shoulder injury on the left side. X-rays of thoracic spine and left shoulder did not show any acute abnormality. She already has a PT she would like to see who is out of network of the [facility] system so I printed out her PT referral. She will follow-up with them in continue her chronic medicines as well as use aggressive ice on any areas that hurt.   PAIN:  Are you having pain? Yes: NPRS scale: 7.5/10 Pain location: L shoulder/ upper back Pain description: sore/ tender/ sharp Aggravating factors: palpation/ movement Relieving factors: medications  PRECAUTIONS: None  WEIGHT BEARING RESTRICTIONS: No  FALLS:  Has patient fallen in last 6 months? Yes. Number of falls 2  LIVING ENVIRONMENT: Lives with: lives alone Lives in: House/apartment Stairs: No Has following equipment at home: None  OCCUPATION: Disability  PLOF: Independent  PATIENT  GOALS: Increase L shoulder ROM/ decrease pain  NEXT MD VISIT: 12/20 (pain management).   OBJECTIVE:   DIAGNOSTIC FINDINGS:  X-ray (negative)  PATIENT SURVEYS:  FOTO initial 46/ goal 74  COGNITION: Overall cognitive status: Within functional limits for tasks assessed     SENSATION: WFL  POSTURE: Rounded shoulders in sitting/ standing  UPPER EXTREMITY ROM:   Active ROM Right eval Left eval  Shoulder flexion 142 deg 79 deg. (Pain)  Shoulder extension    Shoulder abduction 121 deg. 73 deg. (Pain)  Shoulder adduction    Shoulder internal rotation WNL 81 deg.  Shoulder external rotation WNL 76 deg.  Elbow flexion WNL WNL  Elbow extension WNL WNL  Wrist flexion WNL WNL  Wrist extension WNL WNL  Wrist ulnar deviation    Wrist radial deviation    Wrist pronation    Wrist supination    (Blank rows = not tested)  L shoulder PROM 148 deg. Flexion in supine (guarded/ pain).    Cervical spine WFL (all planes).  L elbow WFL (flexion/ extension).    UPPER EXTREMITY MMT:  R shoulder strength grossly 4/5 MMT, biceps/triceps 4+/5 MMT.  Unable to assess L shoulder at this time secondary to pain.  Grip strength: R 46.7#, L 50.4#.    SHOULDER SPECIAL TESTS: Impingement tests: Neer impingement test: positive  and Hawkins/Kennedy impingement test: positive  Rotator cuff assessment: Empty can test: negative and Full can test: negative   Difficulty assessing secondary pain with positioning.    JOINT MOBILITY TESTING:  Good L shoulder capsular mobility.    PALPATION:  Significant L anterior/ posterior shoulder tenderness.  Trigger points noted.    TODAY'S TREATMENT:  DATE: 09/26/22  Subjective:  Pt. Had GI tube removed yesterday.  Pt. Presents with bandage in place and good healing noted.  Pt. Reports continued L UT/ posterior shoulder tenderness  and discomfort with movement.  Pt. Has marked ecchymosis noted in L UT region after last TDN session.  No swelling noted.    Manual tx.:    Supine L/R UT and rotn. C-spine stretches 3x each with manual holds (as tolerated).  STM to L and R UT/ L trigger point release technique (increase static holds).  Prone trigger point dry needling to L UT/ posterior deltoid region.  Several trigger points noted and marked increase in pain with OP/ palpation.  Use of 4 0.25 x 40 mm needles with pistoning technique.  1 muscle fasciculation noted in L UT region.  Good tx. Tolerance.  Use of Biofreeze after tx.     Prone STM to B upper back/ cervical musculature (as tolerated).    PATIENT EDUCATION: Education details: Materials engineer Person educated: Patient Education method: Customer service manager Education comprehension: verbalized understanding and returned demonstration  HOME EXERCISE PROGRAM: Pulley ex.  ASSESSMENT:  CLINICAL IMPRESSION: Pt. Presents with overall increase in cervical AROM with pain at end-range lateral flexion.  Pts. Pain/ tenderness over L UT region remains with manual tx./ palpation.  Good tx. Tolerance with dry needling to L UT region.   Pt. Will continue with current HEP and instructed to move L shoulder as tolerated.  Pt. Will benefit from skilled PT services to increase L shoulder ROM/ strengthening to improve pain-free mobility.    OBJECTIVE IMPAIRMENTS: decreased mobility, decreased ROM, decreased strength, impaired flexibility, impaired UE functional use, improper body mechanics, postural dysfunction, and pain.   ACTIVITY LIMITATIONS: carrying, lifting, toileting, dressing, and reach over head  PARTICIPATION LIMITATIONS: cleaning, driving, shopping, and community activity  PERSONAL FACTORS: Fitness and Past/current experiences are also affecting patient's functional outcome.   REHAB POTENTIAL: Good  CLINICAL DECISION MAKING: Stable/uncomplicated  EVALUATION  COMPLEXITY: Moderate   GOALS: Goals reviewed with patient? Yes  SHORT TERM GOALS: Target date: 09/12/22  Pt. Independent with HEP to increase L shoulder AROM to [facility] as compared to R shoulder to improve pain-free mobility.   Baseline:  See above Goal status: Goal met  LONG TERM GOALS: Target date: 10/10/22  Pt. Will increase FOTO to 64 to improve pain-free UE mobility.   Baseline: initial 46 Goal status: On-going  2.  Pt. Will increase L shoulder flexion/ abduction strength to grossly 4/5 MMT with no increase c/o pain.  Baseline: Unable to assess at eval due to pain Goal status: Not met  3.  Pt. Able to reach/lift 10# object overhead with no L shoulder pain/ limitations.   Baseline:  See above Goal status: On-going  PLAN:  PT FREQUENCY: 1-2x/week  PT DURATION: 8 weeks  PLANNED INTERVENTIONS: Therapeutic exercises, Therapeutic activity, Neuromuscular re-education, Balance training, Gait training, Patient/Family education, Self Care, Joint mobilization, Dry Needling, Electrical stimulation, Cryotherapy, and Moist heat Manual   PLAN FOR NEXT SESSION: Progress HEP/ discuss return to [place], PT, DPT # [phone] 09/30/2022, 2:56 PM

## 2022-10-03 ENCOUNTER — Encounter: Payer: Medicare Other | Admitting: Physical Therapy

## 2022-10-05 ENCOUNTER — Ambulatory Visit: Payer: 59 | Attending: Orthopedic Surgery | Admitting: Physical Therapy

## 2022-10-05 DIAGNOSIS — G8929 Other chronic pain: Secondary | ICD-10-CM | POA: Diagnosis present

## 2022-10-05 DIAGNOSIS — M25612 Stiffness of left shoulder, not elsewhere classified: Secondary | ICD-10-CM | POA: Diagnosis present

## 2022-10-05 DIAGNOSIS — M25512 Pain in left shoulder: Secondary | ICD-10-CM | POA: Insufficient documentation

## 2022-10-05 DIAGNOSIS — M6281 Muscle weakness (generalized): Secondary | ICD-10-CM | POA: Insufficient documentation

## 2022-10-05 NOTE — Therapy (Signed)
OUTPATIENT PHYSICAL THERAPY SHOULDER TREATMENT   Patient Name: Kelsey Bell MRN: [id number] DOB:[birth year], 42 y.o., female Today's Date: 10/05/2022  END OF SESSION:  PT End of Session - 10/05/22 1028     Visit Number 11    Number of Visits 17    Date for PT Re-Evaluation 10/10/22    PT Start Time 1028    PT Stop Time 6433    PT Time Calculation (min) 50 min    Activity Tolerance Patient limited by pain;Patient tolerated treatment well    Behavior During Therapy [facility] for tasks assessed/performed             Past Medical History:  Diagnosis Date   Allergy    Anxiety    Arthritis    Asthma    Breast pain present for several months   Bil LT >RT across of breasts   CRPS (complex regional pain syndrome type I)    Depression    DVT (deep venous thrombosis) ([place]) 06/2018   Fibromyalgia    Kidney stones    LGSIL on Pap smear of cervix 2007   Migraine    Neuromuscular disorder ([place])    Complex regional pain syndrome and Fibromyalgia   Pancreatic pseudocyst    Pancreatitis    Past Surgical History:  Procedure Laterality Date   COLPOSCOPY  2017   neg bx   ERCP     EXTRACORPOREAL SHOCK WAVE LITHOTRIPSY Right 04/26/2017   Procedure: EXTRACORPOREAL SHOCK WAVE LITHOTRIPSY (ESWL);  Surgeon: Hollice Espy, MD;  Location: [facility] ORS;  Service: Urology;  Laterality: Right;   GASTROSTOMY-JEJEUNOSTOMY TUBE CHANGE/PLACEMENT     KNEE ARTHROSCOPY Right    nj placement     TONSILLECTOMY     UPPER ESOPHAGEAL ENDOSCOPIC ULTRASOUND (EUS)     UPPER GASTROINTESTINAL ENDOSCOPY     Patient Active Problem List   Diagnosis Date Noted   Orthostatic syncope    Dehydration 06/09/2020   Genetic testing 10/24/2018   Idiopathic acute pancreatitis 07/22/2018   DVT (deep venous thrombosis) ([place]) 06/04/2018   Acute pancreatitis 05/27/2018   Right knee pain 05/19/2018   Peripheral edema 03/05/2018   Neuropathic pain 11/02/2017   Kidney stone 04/25/2017   Ureteral stone    Migraines  12/15/2016   Morbid obesity with BMI of 45.0-49.9, adult ([place]) 10/20/2015   Asthma, moderate 05/21/2015   Polyarthralgia 12/01/2011   Fibromyalgia 09/18/2011   Low back pain 09/18/2011   PCP: Ricardo Jericho, NP  REFERRING PROVIDER: Anderson Malta, MD  REFERRING DIAG: Acute pain of left shoulder  THERAPY DIAG:  Chronic left shoulder pain  Shoulder joint stiffness, left  Muscle weakness (generalized)  Rationale for Evaluation and Treatment: Rehabilitation  ONSET DATE: 08/11/22 (fall)  SUBJECTIVE:  SUBJECTIVE STATEMENT:   EVALUATION Pt. Fell on curb/ L ankle turned resulting in L shoulder/ head injury.  Pt. Presents with ecchymosis on L forehead/ temple.  Pt. States he was assisted after fall and pulled up on L shoulder/arm resulting in increase pain.  Pt. C/o 7.5/10 L shoulder pain at rest and 3-4/10 at best with medications  PERTINENT HISTORY: Pt. Known well to PT clinic.  Chief Complaint:  Chief Complaint Patient presents with Head Injury Pt states she fell and hit her left side of head and shoulder on the concrete in a parking lot and she is having back pain x yesterday  Subjective:  Kelsey Bell is a 42 y.o. female in today for evaluation after a fall. She had a mechanical fall after she tripped in a parking lot. She hit her left shoulder, left forehead and right back. She says she has a left shoulder labral tear and did PT but now it hurts again since the fall. She says she set her alarm for every hour last night to be sure she could wake up. No nausea, vomiting.  No focal weakness or numbness.  She goes to the pain clinic. Chronic meds include Dilaudid, topamax, meloxicam. Tinazadine and tylenol. She takes these for chronic pancreatitis and CRPS and fibromyalgia.  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 Lack of Transportation (Non-Medical): Yes Physical Activity: Inactive (10/05/2021) Exercise Vital Sign Days of Exercise per Week: 0 days Minutes of Exercise per Session: 0 min Stress: Stress Concern Present (10/05/2021) [place] of Stress : Very much Social Connections: Socially Isolated (10/05/2021) Social Connection and Isolation Panel [NHANES] Frequency of Communication with Friends and Family: More than three times a week Frequency of Social Gatherings with Friends and Family: Twice a week Attends Religious Services: Never Marine scientist or Organizations: No Attends Archivist Meetings: Never Marital Status: Never married Housing Stability: [place] (10/05/2021) [place] Sign Unable to Pay for Housing in the Last Year: No Number of [place] in the Last Year: 1 Unstable Housing in the Last Year: No  ROS See HPI  Objective:  Vitals: 08/12/22 0915 BP: 134/85 Pulse: 86 Resp: 18 Temp: 36.1 C (97 F) TempSrc: Oral SpO2: 97% Weight: (!) 126.6 kg (279 lb) Height: 172.7 cm (5' 7.99") PainSc: 7 PainLoc: Generalized  Body mass index is 42.43 kg/m. Patient's last menstrual period was 07/23/2022 (exact date).  General. Alert, answering all questions appropriately, in no acute distress EYES. Sclera and conjuctiva clear NOSE. No drainage OROPHARYNX. Moist mucous membranes, No suspicious  lesions, normal dentition, no posterior pharyngeal erythema, exudate or asymmetry NECK. Supple LUNGS. Respirations unlabored CARDIOVASCULAR. Regular rate MSK tenderness to palpation right over the midline thoracic spine, can flex extend and laterally rotate/twist spine. Normal gait. Left shoulder with tenderness to palpation over anterior aspect of glenohumeral joint. Decreased range of motion with extension and abduction above 90 degrees due to pain NEUROLOGIC. Speech intact; face symmetrical; no gross neurologic abnormalities SKIN No suspicious lesions identified  No results found for this visit on 08/12/22.  Assessment/Plan: Time: 30 minutes spent  in patient facing and nonpatient facing activities for this visit  Ms. Tipping presents today for acute pain of the left forehead and left shoulder as well as right thoracic back area lateral to the midline after a mechanical fall yesterday when she tripped. Vital signs are normal. She has a normal gait. Good range of motion except with extension and abduction of her left arm above 90 degrees. Previous shoulder injury on the left side. X-rays of thoracic spine and left shoulder did not show any acute abnormality. She already has a PT she would like to see who is out of network of the [facility] system so I printed out her PT referral. She will follow-up with them in continue her chronic medicines as well as use aggressive ice on any areas that hurt.   PAIN:  Are you having pain? Yes: NPRS scale: 7.5/10 Pain location: L shoulder/ upper back Pain description: sore/ tender/ sharp Aggravating factors: palpation/ movement Relieving factors: medications  PRECAUTIONS: None  WEIGHT BEARING RESTRICTIONS: No  FALLS:  Has patient fallen in last 6 months? Yes. Number of falls 2  LIVING ENVIRONMENT: Lives with: lives alone Lives in: House/apartment Stairs: No Has following equipment at home: None  OCCUPATION: Disability  PLOF: Independent  PATIENT  GOALS: Increase L shoulder ROM/ decrease pain  NEXT MD VISIT: 12/20 (pain management).   OBJECTIVE:   DIAGNOSTIC FINDINGS:  X-ray (negative)  PATIENT SURVEYS:  FOTO initial 46/ goal 14  COGNITION: Overall cognitive status: Within functional limits for tasks assessed     SENSATION: WFL  POSTURE: Rounded shoulders in sitting/ standing  UPPER EXTREMITY ROM:   Active ROM Right eval Left eval  Shoulder flexion 142 deg 79 deg. (Pain)  Shoulder extension    Shoulder abduction 121 deg. 73 deg. (Pain)  Shoulder adduction    Shoulder internal rotation WNL 81 deg.  Shoulder external rotation WNL 76 deg.  Elbow flexion WNL WNL  Elbow extension WNL WNL  Wrist flexion WNL WNL  Wrist extension WNL WNL  Wrist ulnar deviation    Wrist radial deviation    Wrist pronation    Wrist supination    (Blank rows = not tested)  L shoulder PROM 148 deg. Flexion in supine (guarded/ pain).    Cervical spine WFL (all planes).  L elbow WFL (flexion/ extension).    UPPER EXTREMITY MMT:  R shoulder strength grossly 4/5 MMT, biceps/triceps 4+/5 MMT.  Unable to assess L shoulder at this time secondary to pain.  Grip strength: R 46.7#, L 50.4#.    SHOULDER SPECIAL TESTS: Impingement tests: Neer impingement test: positive  and Hawkins/Kennedy impingement test: positive  Rotator cuff assessment: Empty can test: negative and Full can test: negative   Difficulty assessing secondary pain with positioning.    JOINT MOBILITY TESTING:  Good L shoulder capsular mobility.    PALPATION:  Significant L anterior/ posterior shoulder tenderness.  Trigger points noted.    TODAY'S TREATMENT:  DATE: 10/05/22  Subjective: Pt. Reports 6/10 L shoulder pain on NPS. Pt. Reports challenge with any overhead ADL's. Pt. States compensation during activities such as brushing her hair to  avoid pain.    There.ex.:    -Nautilus scapular Retraction 10 lbs. 1x8 reps.  Pt. Limited by pain.  -Yellow banded: scapular retraction, tricep ext., ER 2x10 each. No increase in pain.  -Bicep curls 2x10 reps 3# dumbell  Manual tx.:  Prone STM to B upper back/ L posterior shoulder musculature (as tolerated).  Trigger point deep tissue release technique (as tolerated)- several regions of increase pain reported.    -Prone trigger point dry needling to L UT/ posterior deltoid region.  2 trigger points noted and marked increase in pain with OP/ palpation.  Use of 2 0.25 x 40 mm needles with pistoning technique.  No muscle fasciculation noted in L UT/ posterior deltoid region.  Good tx. Tolerance.         PATIENT EDUCATION: Education details: Education administrator Person educated: Patient Education method: Medical illustrator Education comprehension: verbalized understanding and returned demonstration  HOME EXERCISE PROGRAM: Pulley ex.  ASSESSMENT:  CLINICAL IMPRESSION: Pt. Presented to PT with complaints of 6/10 L shoulder pain on NPS. Pt. Was very limited by pain during today's treatment. PT had pt. Perform weighted scapular retraction but was only able to complete one set due to reaching a higher pain level (8/10 NPS). PT then switched over to banded exercises to increase scapular strength without as much resistance due to high pt. irritability. Pt. Completed banded exercises with no increase of pain and remained at a pain level of 6/10 NPS (R shoulder) for the duration of there ex. Pt. Was educated on the benefits of returning to an independent gym based program at [facility] between therapy visits. Pt. Dr. Eileen Stanford continued skilled PT treatment to continue increasing pain free mobility and strength of pt.'s R shoulder/UE.   OBJECTIVE IMPAIRMENTS: decreased mobility, decreased ROM, decreased strength, impaired flexibility, impaired UE functional use, improper body mechanics, postural  dysfunction, and pain.   ACTIVITY LIMITATIONS: carrying, lifting, toileting, dressing, and reach over head  PARTICIPATION LIMITATIONS: cleaning, driving, shopping, and community activity  PERSONAL FACTORS: Fitness and Past/current experiences are also affecting patient's functional outcome.   REHAB POTENTIAL: Good  CLINICAL DECISION MAKING: Stable/uncomplicated  EVALUATION COMPLEXITY: Moderate   GOALS: Goals reviewed with patient? Yes  SHORT TERM GOALS: Target date: 09/12/22  Pt. Independent with HEP to increase L shoulder AROM to [facility] as compared to R shoulder to improve pain-free mobility.   Baseline:  See above Goal status: Goal met  LONG TERM GOALS: Target date: 10/10/22  Pt. Will increase FOTO to 64 to improve pain-free UE mobility.   Baseline: initial 46 Goal status: On-going  2.  Pt. Will increase L shoulder flexion/ abduction strength to grossly 4/5 MMT with no increase c/o pain.  Baseline: Unable to assess at eval due to pain Goal status: Not met  3.  Pt. Able to reach/lift 10# object overhead with no L shoulder pain/ limitations.   Baseline:  See above Goal status: On-going  PLAN:  PT FREQUENCY: 1-2x/week  PT DURATION: 8 weeks  PLANNED INTERVENTIONS: Therapeutic exercises, Therapeutic activity, Neuromuscular re-education, Balance training, Gait training, Patient/Family education, Self Care, Joint mobilization, Dry Needling, Electrical stimulation, Cryotherapy, and Moist heat Manual   PLAN FOR NEXT SESSION: Progress HEP/ discuss return to [employer].  1 more tx. Session before RECERT     Jenay Morici B. Artis Flock, SPT [facility] Needle  [name], PT, DPT # [phone] 10/05/2022, 1:49 PM

## 2022-10-10 ENCOUNTER — Ambulatory Visit: Payer: 59 | Admitting: Physical Therapy

## 2022-10-12 ENCOUNTER — Ambulatory Visit: Payer: 59 | Admitting: Physical Therapy

## 2022-10-17 ENCOUNTER — Encounter: Payer: Self-pay | Admitting: Physical Therapy

## 2022-10-17 ENCOUNTER — Ambulatory Visit: Payer: 59 | Admitting: Physical Therapy

## 2022-10-17 DIAGNOSIS — M6281 Muscle weakness (generalized): Secondary | ICD-10-CM

## 2022-10-17 DIAGNOSIS — M25512 Pain in left shoulder: Secondary | ICD-10-CM | POA: Diagnosis not present

## 2022-10-17 DIAGNOSIS — G8929 Other chronic pain: Secondary | ICD-10-CM

## 2022-10-17 DIAGNOSIS — M25612 Stiffness of left shoulder, not elsewhere classified: Secondary | ICD-10-CM

## 2022-10-17 NOTE — Therapy (Signed)
OUTPATIENT PHYSICAL THERAPY SHOULDER TREATMENT/ RECERTIFICATION  Patient Name: Kelsey Bell MRN: [id number] DOB:Feb 17, 1981, 42 y.o., female Today's Date: 10/17/2022  END OF SESSION:  PT End of Session - 10/17/22 1321     Visit Number 12    Number of Visits 17    Date for PT Re-Evaluation 12/12/22    PT Start Time 1036    PT Stop Time 1135    PT Time Calculation (min) 59 min    Activity Tolerance Patient limited by pain;Patient tolerated treatment well    Behavior During Therapy [facility] for tasks assessed/performed              Past Medical History:  Diagnosis Date   Allergy    Anxiety    Arthritis    Asthma    Breast pain present for several months   Bil LT >RT across of breasts   CRPS (complex regional pain syndrome type I)    Depression    DVT (deep venous thrombosis) ([place]) 06/2018   Fibromyalgia    Kidney stones    LGSIL on Pap smear of cervix 2007   Migraine    Neuromuscular disorder ([place])    Complex regional pain syndrome and Fibromyalgia   Pancreatic pseudocyst    Pancreatitis    Past Surgical History:  Procedure Laterality Date   COLPOSCOPY  2017   neg bx   ERCP     EXTRACORPOREAL SHOCK WAVE LITHOTRIPSY Right 04/26/2017   Procedure: EXTRACORPOREAL SHOCK WAVE LITHOTRIPSY (ESWL);  Surgeon: Hollice Espy, MD;  Location: [facility] ORS;  Service: Urology;  Laterality: Right;   GASTROSTOMY-JEJEUNOSTOMY TUBE CHANGE/PLACEMENT     KNEE ARTHROSCOPY Right    nj placement     TONSILLECTOMY     UPPER ESOPHAGEAL ENDOSCOPIC ULTRASOUND (EUS)     UPPER GASTROINTESTINAL ENDOSCOPY     Patient Active Problem List   Diagnosis Date Noted   Orthostatic syncope    Dehydration 06/09/2020   Genetic testing 10/24/2018   Idiopathic acute pancreatitis 07/22/2018   DVT (deep venous thrombosis) ([place]) 06/04/2018   Acute pancreatitis 05/27/2018   Right knee pain 05/19/2018   Peripheral edema 03/05/2018   Neuropathic pain 11/02/2017   Kidney stone 04/25/2017   Ureteral stone     Migraines 12/15/2016   Morbid obesity with BMI of 45.0-49.9, adult ([place]) 10/20/2015   Asthma, moderate 05/21/2015   Polyarthralgia 12/01/2011   Fibromyalgia 09/18/2011   Low back pain 09/18/2011   PCP: Ricardo Jericho, NP  REFERRING PROVIDER: Anderson Malta, MD  REFERRING DIAG: Acute pain of left shoulder  THERAPY DIAG:  Chronic left shoulder pain  Shoulder joint stiffness, left  Muscle weakness (generalized)  Rationale for Evaluation and Treatment: Rehabilitation  ONSET DATE: 08/11/22 (fall)  SUBJECTIVE:  SUBJECTIVE STATEMENT:   EVALUATION Pt. Fell on curb/ L ankle turned resulting in L shoulder/ head injury.  Pt. Presents with ecchymosis on L forehead/ temple.  Pt. States he was assisted after fall and pulled up on L shoulder/arm resulting in increase pain.  Pt. C/o 7.5/10 L shoulder pain at rest and 3-4/10 at best with medications  PERTINENT HISTORY: Pt. Known well to PT clinic.  Chief Complaint:  Chief Complaint Patient presents with Head Injury Pt states she fell and hit her left side of head and shoulder on the concrete in a parking lot and she is having back pain x yesterday  Subjective:  Kelsey Bell is a 42 y.o. female in today for evaluation after a fall. She had a mechanical fall after she tripped in a parking lot. She hit her left shoulder, left forehead and right back. She says she has a left shoulder labral tear and did PT but now it hurts again since the fall. She says she set her alarm for every hour last night to be sure she could wake up. No nausea, vomiting.  No focal weakness or numbness.  She goes to the pain clinic. Chronic meds include Dilaudid, topamax, meloxicam. Tinazadine and tylenol. She takes these for chronic pancreatitis and CRPS and  fibromyalgia.  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): Yes Lack of Transportation (Non-Medical): Yes Physical Activity: Inactive (10/05/2021) Exercise Vital Sign Days of Exercise per Week: 0 days Minutes of Exercise per Session: 0 min Stress: Stress Concern Present (10/05/2021) [place] of Stress : Very much Social Connections: Socially Isolated (10/05/2021) Social Connection and Isolation Panel [NHANES] Frequency of Communication with Friends and Family: More than three times a week Frequency of Social Gatherings with Friends and Family: Twice a week Attends Religious Services: Never Marine scientist or Organizations: No Attends Archivist Meetings: Never Marital Status: Never married Housing Stability: [place] (10/05/2021) [place] Sign Unable to Pay for Housing in the Last Year: No Number of [place] in the Last Year: 1 Unstable Housing in the Last Year: No  ROS See HPI  Objective:  Vitals: 08/12/22 0915 BP: 134/85 Pulse: 86 Resp: 18 Temp: 36.1 C (97 F) TempSrc: Oral SpO2: 97% Weight: (!) 126.6 kg (279 lb) Height: 172.7 cm (5' 7.99") PainSc: 7 PainLoc: Generalized  Body mass index is 42.43 kg/m. Patient's last menstrual period was 07/23/2022 (exact date).  General. Alert, answering all questions appropriately, in no acute distress EYES. Sclera and conjuctiva clear NOSE. No drainage OROPHARYNX. Moist mucous  membranes, No suspicious lesions, normal dentition, no posterior pharyngeal erythema, exudate or asymmetry NECK. Supple LUNGS. Respirations unlabored CARDIOVASCULAR. Regular rate MSK tenderness to palpation right over the midline thoracic spine, can flex extend and laterally rotate/twist spine. Normal gait. Left shoulder with tenderness to palpation over anterior aspect of glenohumeral joint. Decreased range of motion with extension and abduction above 90 degrees due to pain NEUROLOGIC. Speech intact; face symmetrical; no gross neurologic abnormalities SKIN No suspicious lesions identified  No results found for this visit on 08/12/22.  Assessment/Plan: Time: 30 minutes spent  in patient facing and nonpatient facing activities for this visit  Ms. Daniele presents today for acute pain of the left forehead and left shoulder as well as right thoracic back area lateral to the midline after a mechanical fall yesterday when she tripped. Vital signs are normal. She has a normal gait. Good range of motion except with extension and abduction of her left arm above 90 degrees. Previous shoulder injury on the left side. X-rays of thoracic spine and left shoulder did not show any acute abnormality. She already has a PT she would like to see who is out of network of the [place] system so I printed out her PT referral. She will follow-up with them in continue her chronic medicines as well as use aggressive ice on any areas that hurt.   PAIN:  Are you having pain? Yes: NPRS scale: 7.5/10 Pain location: L shoulder/ upper back Pain description: sore/ tender/ sharp Aggravating factors: palpation/ movement Relieving factors: medications  PRECAUTIONS: None  WEIGHT BEARING RESTRICTIONS: No  FALLS:  Has patient fallen in last 6 months? Yes. Number of falls 2  LIVING ENVIRONMENT: Lives with: lives alone Lives in: House/apartment Stairs: No Has following equipment at home: None  OCCUPATION: Disability  PLOF:  Independent  PATIENT GOALS: Increase L shoulder ROM/ decrease pain  NEXT MD VISIT: 12/20 (pain management).   OBJECTIVE:   DIAGNOSTIC FINDINGS:  X-ray (negative)  PATIENT SURVEYS:  FOTO initial 46/ goal 20  COGNITION: Overall cognitive status: Within functional limits for tasks assessed     SENSATION: WFL  POSTURE: Rounded shoulders in sitting/ standing  UPPER EXTREMITY ROM:   Active ROM Right eval Left eval  Shoulder flexion 142 deg 79 deg. (Pain)  Shoulder extension    Shoulder abduction 121 deg. 73 deg. (Pain)  Shoulder adduction    Shoulder internal rotation WNL 81 deg.  Shoulder external rotation WNL 76 deg.  Elbow flexion WNL WNL  Elbow extension WNL WNL  Wrist flexion WNL WNL  Wrist extension WNL WNL  Wrist ulnar deviation    Wrist radial deviation    Wrist pronation    Wrist supination    (Blank rows = not tested)  L shoulder PROM 148 deg. Flexion in supine (guarded/ pain).    Cervical spine WFL (all planes).  L elbow WFL (flexion/ extension).    UPPER EXTREMITY MMT:  R shoulder strength grossly 4/5 MMT, biceps/triceps 4+/5 MMT.  Unable to assess L shoulder at this time secondary to pain.  Grip strength: R 46.7#, L 50.4#.    SHOULDER SPECIAL TESTS: Impingement tests: Neer impingement test: positive  and Hawkins/Kennedy impingement test: positive  Rotator cuff assessment: Empty can test: negative and Full can test: negative   Difficulty assessing secondary pain with positioning.    JOINT MOBILITY TESTING:  Good L shoulder capsular mobility.    PALPATION:  Significant L anterior/ posterior shoulder tenderness.  Trigger points noted.    TODAY'S TREATMENT:  DATE: 10/17/22  Subjective: Pt. States L and R shoulder are "spicy" today.  Pt. Has being having several lows with blood sugar and a pancreatitis flare-up.  Pt.  Brought a soda with her to PT due to blood sugar issues.      There.ex.:    Seated L shoulder AROM (all planes).    Discussed HEP/ [employer].  -Yellow banded: seated scapular retraction, tricep ext., bilateral ER/ unilateral IR 10x2 each. No increase in pain.  -Standing bicep curls/ sh. Abduction/ flexion with YTB 10x2.    Manual tx.:  Prone STM to B upper back/ L posterior shoulder musculature (as tolerated).  Trigger point deep tissue release technique (as tolerated)- several regions of increase pain reported.    -Prone trigger point dry needling to R and L UT/ posterior deltoid region.  2 trigger points noted and marked increase in pain with OP/ palpation.  Use of 4 0.25 x 40 mm needles with pistoning technique.  No muscle fasciculation noted in L UT/ posterior deltoid region.  Good tx. Tolerance.         PATIENT EDUCATION: Education details: Materials engineer Person educated: Patient Education method: Customer service manager Education comprehension: verbalized understanding and returned demonstration  HOME EXERCISE PROGRAM: [place] ex./ [employer] program  ASSESSMENT:  CLINICAL IMPRESSION: Pt. Presented to PT with complaints of L shoulder/ R UT pain prior to tx.  Pt. Has been limited over past week secondary to pancreatitis flare and fluctuations in blood sugar.   Pt. Completed banded exercises with no increase of pain and remained at a pain level of <6/10 NPS (R shoulder) for the duration of there ex. Pt. Was educated on the benefits of returning to an independent gym based program at [facility] between therapy visits. Pt. Will benefit from continued skilled PT services to increase L shoulder pain-free ROM/ strengthening to improve return to daily functional mobility.    OBJECTIVE IMPAIRMENTS: decreased mobility, decreased ROM, decreased strength, impaired flexibility, impaired UE functional use, improper body mechanics, postural dysfunction, and pain.   ACTIVITY LIMITATIONS:  carrying, lifting, toileting, dressing, and reach over head  PARTICIPATION LIMITATIONS: cleaning, driving, shopping, and community activity  PERSONAL FACTORS: Fitness and Past/current experiences are also affecting patient's functional outcome.   REHAB POTENTIAL: Good  CLINICAL DECISION MAKING: Stable/uncomplicated  EVALUATION COMPLEXITY: Moderate   GOALS: Goals reviewed with patient? Yes  SHORT TERM GOALS: Target date: 09/12/22  Pt. Independent with HEP to increase L shoulder AROM to [facility] as compared to R shoulder to improve pain-free mobility.   Baseline:  See above Goal status: Goal met  LONG TERM GOALS: Target date: 12/12/22  Pt. Will increase FOTO to 64 to improve pain-free UE mobility.   Baseline: initial 46.  2/13: 65 Goal status: Goal met  2.  Pt. Will increase L shoulder flexion/ abduction strength to grossly 4/5 MMT with no increase c/o pain.  Baseline: Unable to assess at eval due to pain Goal status: Not met  3.  Pt. Able to reach/lift 10# object overhead with no L shoulder pain/ limitations.   Baseline:  See above Goal status: Not met  4.  Pt. Able to complete 30+ minute [employer] ex. Program with no L shoulder pain or limitations.   Baseline:  See above Goal status: Initial  PLAN:  PT FREQUENCY: Biweekly  PT DURATION: 8 weeks  PLANNED INTERVENTIONS: Therapeutic exercises, Therapeutic activity, Neuromuscular re-education, Balance training, Gait training, Patient/Family education, Self Care, Joint mobilization, Dry Needling, Electrical stimulation, Cryotherapy, and  Moist heat Manual tx.  PLAN FOR NEXT SESSION:  Discuss return to [employer].       Ashlyn B. Rogers Blocker, SPT Pura Spice, PT, DPT # [phone] 10/17/2022, 1:22 PM

## 2022-10-31 ENCOUNTER — Ambulatory Visit: Payer: 59 | Admitting: Physical Therapy

## 2022-10-31 DIAGNOSIS — M25612 Stiffness of left shoulder, not elsewhere classified: Secondary | ICD-10-CM

## 2022-10-31 DIAGNOSIS — M25512 Pain in left shoulder: Secondary | ICD-10-CM | POA: Diagnosis not present

## 2022-10-31 DIAGNOSIS — M6281 Muscle weakness (generalized): Secondary | ICD-10-CM

## 2022-10-31 DIAGNOSIS — G8929 Other chronic pain: Secondary | ICD-10-CM

## 2022-11-02 ENCOUNTER — Encounter: Payer: Self-pay | Admitting: Physical Therapy

## 2022-11-02 NOTE — Therapy (Signed)
OUTPATIENT PHYSICAL THERAPY SHOULDER TREATMENT  Patient Name: Kelsey Bell MRN: [id number] DOB:Jan 04, 1981, 42 y.o., female Today's Date: 10/31/2022  END OF SESSION:  PT End of Session - 11/02/22 1506     Visit Number 13    Number of Visits 17    Date for PT Re-Evaluation 12/12/22    PT Start Time 1112    PT Stop Time 1201    PT Time Calculation (min) 49 min    Activity Tolerance Patient limited by pain;Patient tolerated treatment well    Behavior During Therapy [facility] for tasks assessed/performed            Past Medical History:  Diagnosis Date   Allergy    Anxiety    Arthritis    Asthma    Breast pain present for several months   Bil LT >RT across of breasts   CRPS (complex regional pain syndrome type I)    Depression    DVT (deep venous thrombosis) ([place]) 06/2018   Fibromyalgia    Kidney stones    LGSIL on Pap smear of cervix 2007   Migraine    Neuromuscular disorder ([place])    Complex regional pain syndrome and Fibromyalgia   Pancreatic pseudocyst    Pancreatitis    Past Surgical History:  Procedure Laterality Date   COLPOSCOPY  2017   neg bx   ERCP     EXTRACORPOREAL SHOCK WAVE LITHOTRIPSY Right 04/26/2017   Procedure: EXTRACORPOREAL SHOCK WAVE LITHOTRIPSY (ESWL);  Surgeon: Hollice Espy, MD;  Location: [facility] ORS;  Service: Urology;  Laterality: Right;   GASTROSTOMY-JEJEUNOSTOMY TUBE CHANGE/PLACEMENT     KNEE ARTHROSCOPY Right    nj placement     TONSILLECTOMY     UPPER ESOPHAGEAL ENDOSCOPIC ULTRASOUND (EUS)     UPPER GASTROINTESTINAL ENDOSCOPY     Patient Active Problem List   Diagnosis Date Noted   Orthostatic syncope    Dehydration 06/09/2020   Genetic testing 10/24/2018   Idiopathic acute pancreatitis 07/22/2018   DVT (deep venous thrombosis) ([place]) 06/04/2018   Acute pancreatitis 05/27/2018   Right knee pain 05/19/2018   Peripheral edema 03/05/2018   Neuropathic pain 11/02/2017   Kidney stone 04/25/2017   Ureteral stone    Migraines 12/15/2016    Morbid obesity with BMI of 45.0-49.9, adult ([place]) 10/20/2015   Asthma, moderate 05/21/2015   Polyarthralgia 12/01/2011   Fibromyalgia 09/18/2011   Low back pain 09/18/2011   PCP: Ricardo Jericho, NP  REFERRING PROVIDER: Anderson Malta, MD  REFERRING DIAG: Acute pain of left shoulder  THERAPY DIAG:  Chronic left shoulder pain  Shoulder joint stiffness, left  Muscle weakness (generalized)  Rationale for Evaluation and Treatment: Rehabilitation  ONSET DATE: 08/11/22 (fall)  SUBJECTIVE:  SUBJECTIVE STATEMENT:   EVALUATION Pt. Fell on curb/ L ankle turned resulting in L shoulder/ head injury.  Pt. Presents with ecchymosis on L forehead/ temple.  Pt. States he was assisted after fall and pulled up on L shoulder/arm resulting in increase pain.  Pt. C/o 7.5/10 L shoulder pain at rest and 3-4/10 at best with medications  PERTINENT HISTORY: Pt. Known well to PT clinic.  Chief Complaint:  Chief Complaint Patient presents with Head Injury Pt states she fell and hit her left side of head and shoulder on the concrete in a parking lot and she is having back pain x yesterday  Subjective:  Kelsey Bell is a 42 y.o. female in today for evaluation after a fall. She had a mechanical fall after she tripped in a parking lot. She hit her left shoulder, left forehead and right back. She says she has a left shoulder labral tear and did PT but now it hurts again since the fall. She says she set her alarm for every hour last night to be sure she could wake up. No nausea, vomiting.  No focal weakness or numbness.  She goes to the pain clinic. Chronic meds include Dilaudid, topamax, meloxicam. Tinazadine and tylenol. She takes these for chronic pancreatitis and CRPS and fibromyalgia.  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): Yes Lack of Transportation (Non-Medical): Yes Physical Activity: Inactive (10/05/2021) Exercise Vital Sign Days of Exercise per Week: 0 days Minutes of Exercise per Session: 0 min Stress: Stress Concern Present (10/05/2021) [place] of Stress : Very much Social Connections: Socially Isolated (10/05/2021) Social Connection and Isolation Panel [NHANES] Frequency of Communication with Friends and Family: More than three times a week Frequency of Social Gatherings with Friends and Family: Twice a week Attends Religious Services: Never Marine scientist or Organizations: No Attends Archivist Meetings: Never Marital Status: Never married Housing Stability: [place] (10/05/2021) [place] Sign Unable to Pay for Housing in the Last Year: No Number of [place] in the Last Year: 1 Unstable Housing in the Last Year: No  ROS See HPI  Objective:  Vitals: 08/12/22 0915 BP: 134/85 Pulse: 86 Resp: 18 Temp: 36.1 C (97 F) TempSrc: Oral SpO2: 97% Weight: (!) 126.6 kg (279 lb) Height: 172.7 cm (5' 7.99") PainSc: 7 PainLoc: Generalized  Body mass index is 42.43 kg/m. Patient's last menstrual period was 07/23/2022 (exact date).  General. Alert, answering all questions appropriately, in no acute distress EYES. Sclera and conjuctiva clear NOSE. No drainage OROPHARYNX. Moist mucous membranes, No suspicious lesions, normal  dentition, no posterior pharyngeal erythema, exudate or asymmetry NECK. Supple LUNGS. Respirations unlabored CARDIOVASCULAR. Regular rate MSK tenderness to palpation right over the midline thoracic spine, can flex extend and laterally rotate/twist spine. Normal gait. Left shoulder with tenderness to palpation over anterior aspect of glenohumeral joint. Decreased range of motion with extension and abduction above 90 degrees due to pain NEUROLOGIC. Speech intact; face symmetrical; no gross neurologic abnormalities SKIN No suspicious lesions identified  No results found for this visit on 08/12/22.  Assessment/Plan: Time: 30 minutes spent  in patient facing and nonpatient facing activities for this visit  Ms. Queener presents today for acute pain of the left forehead and left shoulder as well as right thoracic back area lateral to the midline after a mechanical fall yesterday when she tripped. Vital signs are normal. She has a normal gait. Good range of motion except with extension and abduction of her left arm above 90 degrees. Previous shoulder injury on the left side. X-rays of thoracic spine and left shoulder did not show any acute abnormality. She already has a PT she would like to see who is out of network of the [place] system so I printed out her PT referral. She will follow-up with them in continue her chronic medicines as well as use aggressive ice on any areas that hurt.   PAIN:  Are you having pain? Yes: NPRS scale: 7.5/10 Pain location: L shoulder/ upper back Pain description: sore/ tender/ sharp Aggravating factors: palpation/ movement Relieving factors: medications  PRECAUTIONS: None  WEIGHT BEARING RESTRICTIONS: No  FALLS:  Has patient fallen in last 6 months? Yes. Number of falls 2  LIVING ENVIRONMENT: Lives with: lives alone Lives in: House/apartment Stairs: No Has following equipment at home: None  OCCUPATION: Disability  PLOF: Independent  PATIENT GOALS: Increase L  shoulder ROM/ decrease pain  NEXT MD VISIT: 12/20 (pain management).   OBJECTIVE:   DIAGNOSTIC FINDINGS:  X-ray (negative)  PATIENT SURVEYS:  FOTO initial 46/ goal 34  COGNITION: Overall cognitive status: Within functional limits for tasks assessed     SENSATION: WFL  POSTURE: Rounded shoulders in sitting/ standing  UPPER EXTREMITY ROM:   Active ROM Right eval Left eval  Shoulder flexion 142 deg 79 deg. (Pain)  Shoulder extension    Shoulder abduction 121 deg. 73 deg. (Pain)  Shoulder adduction    Shoulder internal rotation WNL 81 deg.  Shoulder external rotation WNL 76 deg.  Elbow flexion WNL WNL  Elbow extension WNL WNL  Wrist flexion WNL WNL  Wrist extension WNL WNL  Wrist ulnar deviation    Wrist radial deviation    Wrist pronation    Wrist supination    (Blank rows = not tested)  L shoulder PROM 148 deg. Flexion in supine (guarded/ pain).    Cervical spine WFL (all planes).  L elbow WFL (flexion/ extension).    UPPER EXTREMITY MMT:  R shoulder strength grossly 4/5 MMT, biceps/triceps 4+/5 MMT.  Unable to assess L shoulder at this time secondary to pain.  Grip strength: R 46.7#, L 50.4#.    SHOULDER SPECIAL TESTS: Impingement tests: Neer impingement test: positive  and Hawkins/Kennedy impingement test: positive  Rotator cuff assessment: Empty can test: negative and Full can test: negative   Difficulty assessing secondary pain with positioning.    JOINT MOBILITY TESTING:  Good L shoulder capsular mobility.    PALPATION:  Significant L anterior/ posterior shoulder tenderness.  Trigger points noted.    TODAY'S TREATMENT:  DATE: 10/31/22  Subjective: Pt. States L shoulder is "spicy" today.  Pt. C/o worsening of GI symptoms over past week/ feeling bloated and limited appetite.  No subjective pain score documented.     There.ex.:    Standing L shoulder AROM (all planes)- pain tolerable.    Seated shoulder pulley ex. (Flexion/ abduction)- warm-up  -RTB: seated scapular retraction, tricep ext., bilateral ER/ unilateral IR 10x2 each. No increase in pain.  -Standing bicep curls/ sh. Abduction/ flexion with RTB 10x2.    Supine L shoulder A/AROM (all planes) as tolerated.    No manual tx. Today/ no TDN      PATIENT EDUCATION: Education details: Materials engineer Person educated: Patient Education method: Customer service manager Education comprehension: verbalized understanding and returned demonstration  HOME EXERCISE PROGRAM: Pulley ex./ [employer] program  ASSESSMENT:  CLINICAL IMPRESSION: Pt. Presented to PT with complaints of L shoulder pain prior to tx.  Pt. Has been limited over past week secondary to GI issues.  Pt. Has not participated with gym based there.ex.   Pt. Completed banded exercises with no increase c/o shoulder pain for the duration of there ex. Pt. Was educated on the benefits of returning to an independent gym based program at [facility] between therapy visits. Pt. Will benefit from continued skilled PT services to increase L shoulder pain-free ROM/ strengthening to improve return to daily functional mobility.    OBJECTIVE IMPAIRMENTS: decreased mobility, decreased ROM, decreased strength, impaired flexibility, impaired UE functional use, improper body mechanics, postural dysfunction, and pain.   ACTIVITY LIMITATIONS: carrying, lifting, toileting, dressing, and reach over head  PARTICIPATION LIMITATIONS: cleaning, driving, shopping, and community activity  PERSONAL FACTORS: Fitness and Past/current experiences are also affecting patient's functional outcome.   REHAB POTENTIAL: Good  CLINICAL DECISION MAKING: Stable/uncomplicated  EVALUATION COMPLEXITY: Moderate   GOALS: Goals reviewed with patient? Yes  SHORT TERM GOALS: Target date: 09/12/22  Pt. Independent with HEP  to increase L shoulder AROM to [facility] as compared to R shoulder to improve pain-free mobility.   Baseline:  See above Goal status: Goal met  LONG TERM GOALS: Target date: 12/12/22  Pt. Will increase FOTO to 64 to improve pain-free UE mobility.   Baseline: initial 46.  2/13: 65 Goal status: Goal met  2.  Pt. Will increase L shoulder flexion/ abduction strength to grossly 4/5 MMT with no increase c/o pain.  Baseline: Unable to assess at eval due to pain Goal status: Not met  3.  Pt. Able to reach/lift 10# object overhead with no L shoulder pain/ limitations.   Baseline:  See above Goal status: Not met  4.  Pt. Able to complete 30+ minute [employer] ex. Program with no L shoulder pain or limitations.   Baseline:  See above Goal status: Initial  PLAN:  PT FREQUENCY: Biweekly  PT DURATION: 8 weeks  PLANNED INTERVENTIONS: Therapeutic exercises, Therapeutic activity, Neuromuscular re-education, Balance training, Gait training, Patient/Family education, Self Care, Joint mobilization, Dry Needling, Electrical stimulation, Cryotherapy, and Moist heat Manual tx.  PLAN FOR NEXT SESSION:  Promote return to [employer].       Ashlyn B. Rogers Blocker, SPT Pura Spice, PT, DPT # [phone] 11/02/2022, 3:07 PM

## 2022-11-16 ENCOUNTER — Ambulatory Visit: Payer: 59 | Admitting: Physical Therapy

## 2022-11-23 ENCOUNTER — Encounter: Payer: Self-pay | Admitting: Physician Assistant

## 2022-11-23 DIAGNOSIS — Z1231 Encounter for screening mammogram for malignant neoplasm of breast: Secondary | ICD-10-CM

## 2022-11-28 ENCOUNTER — Encounter: Payer: 59 | Admitting: Physical Therapy

## 2022-12-01 ENCOUNTER — Other Ambulatory Visit: Payer: Self-pay | Admitting: Physician Assistant

## 2022-12-01 DIAGNOSIS — N644 Mastodynia: Secondary | ICD-10-CM

## 2022-12-08 ENCOUNTER — Ambulatory Visit: Payer: 59 | Attending: Orthopedic Surgery | Admitting: Physical Therapy

## 2022-12-08 ENCOUNTER — Encounter: Payer: Self-pay | Admitting: Physical Therapy

## 2022-12-08 DIAGNOSIS — M6281 Muscle weakness (generalized): Secondary | ICD-10-CM | POA: Insufficient documentation

## 2022-12-08 DIAGNOSIS — M25612 Stiffness of left shoulder, not elsewhere classified: Secondary | ICD-10-CM | POA: Diagnosis present

## 2022-12-08 DIAGNOSIS — G8929 Other chronic pain: Secondary | ICD-10-CM | POA: Insufficient documentation

## 2022-12-08 DIAGNOSIS — M25512 Pain in left shoulder: Secondary | ICD-10-CM | POA: Diagnosis present

## 2022-12-08 NOTE — Therapy (Signed)
OUTPATIENT PHYSICAL THERAPY SHOULDER TREATMENT  Patient Name: Cathe MonsLauren B Charney MRN: [id number] DOB:09/13/1980, 42 y.o., female Today's Date: 12/08/2022  END OF SESSION:  PT End of Session - 12/08/22 1303     Visit Number 14    Number of Visits 17    Date for PT Re-Evaluation 12/12/22    PT Start Time 1304    PT Stop Time 1353    PT Time Calculation (min) 49 min    Activity Tolerance Patient limited by pain;Patient tolerated treatment well    Behavior During Therapy [facility] for tasks assessed/performed            Past Medical History:  Diagnosis Date   Allergy    Anxiety    Arthritis    Asthma    Breast pain present for several months   Bil LT >RT across of breasts   CRPS (complex regional pain syndrome type I)    Depression    DVT (deep venous thrombosis) 06/2018   Fibromyalgia    Kidney stones    LGSIL on Pap smear of cervix 2007   Migraine    Neuromuscular disorder    Complex regional pain syndrome and Fibromyalgia   Pancreatic pseudocyst    Pancreatitis    Past Surgical History:  Procedure Laterality Date   COLPOSCOPY  2017   neg bx   ERCP     EXTRACORPOREAL SHOCK WAVE LITHOTRIPSY Right 04/26/2017   Procedure: EXTRACORPOREAL SHOCK WAVE LITHOTRIPSY (ESWL);  Surgeon: Vanna ScotlandBrandon, Ashley, MD;  Location: [facility];  Service: Urology;  Laterality: Right;   GASTROSTOMY-JEJEUNOSTOMY TUBE CHANGE/PLACEMENT     KNEE ARTHROSCOPY Right    nj placement     TONSILLECTOMY     UPPER ESOPHAGEAL ENDOSCOPIC ULTRASOUND (EUS)     UPPER GASTROINTESTINAL ENDOSCOPY     Patient Active Problem List   Diagnosis Date Noted   Orthostatic syncope    Dehydration 06/09/2020   Genetic testing 10/24/2018   Idiopathic acute pancreatitis 07/22/2018   DVT (deep venous thrombosis) 06/04/2018   Acute pancreatitis 05/27/2018   Right knee pain 05/19/2018   Peripheral edema 03/05/2018   Neuropathic pain 11/02/2017   Kidney stone 04/25/2017   Ureteral stone    Migraines 12/15/2016   Morbid obesity  with BMI of 45.0-49.9, adult 10/20/2015   Asthma, moderate 05/21/2015   Polyarthralgia 12/01/2011   Fibromyalgia 09/18/2011   Low back pain 09/18/2011   PCP: Titus MouldWhite, Elizabeth Burney, NP  REFERRING PROVIDER: Virgina OrganLau, Brian Chei-Fai, MD  REFERRING DIAG: Acute pain of left shoulder  THERAPY DIAG:  Chronic left shoulder pain  Shoulder joint stiffness, left  Muscle weakness (generalized)  Rationale for Evaluation and Treatment: Rehabilitation  ONSET DATE: 08/11/22 (fall)  SUBJECTIVE:  SUBJECTIVE STATEMENT:   EVALUATION Pt. Fell on curb/ L ankle turned resulting in L shoulder/ head injury.  Pt. Presents with ecchymosis on L forehead/ temple.  Pt. States he was assisted after fall and pulled up on L shoulder/arm resulting in increase pain.  Pt. C/o 7.5/10 L shoulder pain at rest and 3-4/10 at best with medications  PERTINENT HISTORY: Pt. Known well to PT clinic.  Chief Complaint:  Chief Complaint Patient presents with Head Injury Pt states she fell and hit her left side of head and shoulder on the concrete in a parking lot and she is having back pain x yesterday  Subjective:  Edilia Ghuman is a 42 y.o. female in today for evaluation after a fall. She had a mechanical fall after she tripped in a parking lot. She hit her left shoulder, left forehead and right back. She says she has a left shoulder labral tear and did PT but now it hurts again since the fall. She says she set her alarm for every hour last night to be sure she could wake up. No nausea, vomiting.  No focal weakness or numbness.  She goes to the pain clinic. Chronic meds include Dilaudid, topamax, meloxicam. Tinazadine and tylenol. She takes these for chronic pancreatitis and CRPS and fibromyalgia.  Social Determinants of Health  Financial  Resource Strain: Medium Risk (06/04/2022) Overall Financial Resource Strain (CARDIA) Difficulty of Paying Living Expenses: Somewhat hard Food Insecurity: Food Insecurity Present (06/04/2022) Hunger Vital Sign Worried About Running Out of Food in the Last Year: Sometimes true Ran Out of Food in the Last Year: Sometimes true Transportation Needs: Unmet Transportation Needs (06/04/2022) PRAPARE - Contractor (Medical): Yes Lack of Transportation (Non-Medical): Yes Physical Activity: Inactive (10/05/2021) Exercise Vital Sign Days of Exercise per Week: 0 days Minutes of Exercise per Session: 0 min Stress: Stress Concern Present (10/05/2021) [employer] of Occupational Health - Occupational Stress Questionnaire Feeling of Stress : Very much Social Connections: Socially Isolated (10/05/2021) Social Connection and Isolation Panel [NHANES] Frequency of Communication with Friends and Family: More than three times a week Frequency of Social Gatherings with Friends and Family: Twice a week Attends Religious Services: Never Database administrator or Organizations: No Attends Banker Meetings: Never Marital Status: Never married Housing Stability: Low Risk (10/05/2021) Housing Stability Vital Sign Unable to Pay for Housing in the Last Year: No Number of Places Lived in the Last Year: 1 Unstable Housing in the Last Year: No  ROS See HPI  Objective:  Vitals: 08/12/22 0915 BP: 134/85 Pulse: 86 Resp: 18 Temp: 36.1 C (97 F) TempSrc: Oral SpO2: 97% Weight: (!) 126.6 kg (279 lb) Height: 172.7 cm (5' 7.99") PainSc: 7 PainLoc: Generalized  Body mass index is 42.43 kg/m. Patient's last menstrual period was 07/23/2022 (exact date).  General. Alert, answering all questions appropriately, in no acute distress EYES. Sclera and conjuctiva clear NOSE. No drainage OROPHARYNX. Moist mucous membranes, No suspicious lesions, normal dentition, no posterior  pharyngeal erythema, exudate or asymmetry NECK. Supple LUNGS. Respirations unlabored CARDIOVASCULAR. Regular rate MSK tenderness to palpation right over the midline thoracic spine, can flex extend and laterally rotate/twist spine. Normal gait. Left shoulder with tenderness to palpation over anterior aspect of glenohumeral joint. Decreased range of motion with extension and abduction above 90 degrees due to pain NEUROLOGIC. Speech intact; face symmetrical; no gross neurologic abnormalities SKIN No suspicious lesions identified  No results found for this visit on 08/12/22.  Assessment/Plan: Time: 30 minutes spent  in patient facing and nonpatient facing activities for this visit  Ms. Virgo presents today for acute pain of the left forehead and left shoulder as well as right thoracic back area lateral to the midline after a mechanical fall yesterday when she tripped. Vital signs are normal. She has a normal gait. Good range of motion except with extension and abduction of her left arm above 90 degrees. Previous shoulder injury on the left side. X-rays of thoracic spine and left shoulder did not show any acute abnormality. She already has a PT she would like to see who is out of network of the [facility] system so I printed out her PT referral. She will follow-up with them in continue her chronic medicines as well as use aggressive ice on any areas that hurt.   PAIN:  Are you having pain? Yes: NPRS scale: 7.5/10 Pain location: L shoulder/ upper back Pain description: sore/ tender/ sharp Aggravating factors: palpation/ movement Relieving factors: medications  PRECAUTIONS: None  WEIGHT BEARING RESTRICTIONS: No  FALLS:  Has patient fallen in last 6 months? Yes. Number of falls 2  LIVING ENVIRONMENT: Lives with: lives alone Lives in: House/apartment Stairs: No Has following equipment at home: None  OCCUPATION: Disability  PLOF: Independent  PATIENT GOALS: Increase L shoulder ROM/ decrease  pain  NEXT MD VISIT: 12/20 (pain management).   OBJECTIVE:   DIAGNOSTIC FINDINGS:  X-ray (negative)  PATIENT SURVEYS:  FOTO initial 46/ goal 75  COGNITION: Overall cognitive status: Within functional limits for tasks assessed     SENSATION: WFL  POSTURE: Rounded shoulders in sitting/ standing  UPPER EXTREMITY ROM:   Active ROM Right eval Left eval  Shoulder flexion 142 deg 79 deg. (Pain)  Shoulder extension    Shoulder abduction 121 deg. 73 deg. (Pain)  Shoulder adduction    Shoulder internal rotation WNL 81 deg.  Shoulder external rotation WNL 76 deg.  Elbow flexion WNL WNL  Elbow extension WNL WNL  Wrist flexion WNL WNL  Wrist extension WNL WNL  Wrist ulnar deviation    Wrist radial deviation    Wrist pronation    Wrist supination    (Blank rows = not tested)  L shoulder PROM 148 deg. Flexion in supine (guarded/ pain).    Cervical spine WFL (all planes).  L elbow WFL (flexion/ extension).    UPPER EXTREMITY MMT:  R shoulder strength grossly 4/5 MMT, biceps/triceps 4+/5 MMT.  Unable to assess L shoulder at this time secondary to pain.  Grip strength: R 46.7#, L 50.4#.     SHOULDER SPECIAL TESTS: Impingement tests: Neer impingement test: positive  and Hawkins/Kennedy impingement test: positive  Rotator cuff assessment: Empty can test: negative and Full can test: negative   Difficulty assessing secondary pain with positioning.    JOINT MOBILITY TESTING:  Good L shoulder capsular mobility.    PALPATION:  Significant L anterior/ posterior shoulder tenderness.  Trigger points noted.    TODAY'S TREATMENT:  DATE: 12/08/2022  Subjective: Pt. Reports a clothing rack fell on her L shoulder 3 days ago.  Pt. States her low back has been "spicy over past couple day.  Pt. Has been cleaning out house and filling up a dumpster.      There.ex.:    No there.ex. today.  Manual tx.:   Reassessment of L shoulder AROM (sitting posture): flexion 132 deg. (Pt. Reports she can't get arm straight)/ abduction 116 deg.  L grip strength: 62# (marked improvement), R 50.5#.  Supine R/L shoulder AP/inf. Grade II-III mobs. 3x20 sec.  Supine R/L shoulder AA/PROM (all planes).  Marked increase in L shoulder flexion.  Prone STM to B upper back/ L posterior shoulder musculature (as tolerated).  Trigger point deep tissue release technique (as tolerated)- several regions of increase pain reported.     -Prone trigger point dry needling to R and L UT/ posterior deltoid region.  2 trigger points noted and marked increase in pain with OP/ palpation.  Use of 4 0.25 x 40 mm needles with pistoning technique.  No muscle fasciculation noted in L UT/ posterior deltoid region.  Good tx. Tolerance.        -MH to mid-low back during TDN.    PATIENT EDUCATION: Education details: Education administratorulley/ ice Person educated: Patient Education method: Medical illustratorxplanation and Demonstration Education comprehension: verbalized understanding and returned demonstration  HOME EXERCISE PROGRAM: [employer] ex./ [employer] program  ASSESSMENT:  CLINICAL IMPRESSION: Pt. Presented to PT with complaints of L shoulder pain prior to tx.   Pt. Has not participated with gym based there.ex. but has been busy with household cleaning.  Marked improvement in L shoulder ROM/ grip strength.   Pt. Was educated on the benefits of returning to an independent gym based program at [facility] between therapy visits. Pt. Will benefit from continued skilled PT services to increase L shoulder pain-free ROM/ strengthening to improve return to daily functional mobility.    OBJECTIVE IMPAIRMENTS: decreased mobility, decreased ROM, decreased strength, impaired flexibility, impaired UE functional use, improper body mechanics, postural dysfunction, and pain.   ACTIVITY LIMITATIONS: carrying, lifting, toileting,  dressing, and reach over head  PARTICIPATION LIMITATIONS: cleaning, driving, shopping, and community activity  PERSONAL FACTORS: Fitness and Past/current experiences are also affecting patient's functional outcome.   REHAB POTENTIAL: Good  CLINICAL DECISION MAKING: Stable/uncomplicated  EVALUATION COMPLEXITY: Moderate   GOALS: Goals reviewed with patient? Yes  SHORT TERM GOALS: Target date: 09/12/22  Pt. Independent with HEP to increase L shoulder [facility]OM to [facility] as compared to R shoulder to improve pain-free mobility.   Baseline:  See above Goal status: Goal met  LONG TERM GOALS: Target date: 12/12/22  Pt. Will increase FOTO to 64 to improve pain-free UE mobility.   Baseline: initial 46.  2/13: 65 Goal status: Goal met  2.  Pt. Will increase L shoulder flexion/ abduction strength to grossly 4/5 MMT with no increase c/o pain.  Baseline: Unable to assess at eval due to pain Goal status: Not met  3.  Pt. Able to reach/lift 10# object overhead with no L shoulder pain/ limitations.   Baseline:  See above Goal status: Not met  4.  Pt. Able to complete 30+ minute [employer] ex. Program with no L shoulder pain or limitations.   Baseline:  See above Goal status: Initial  PLAN:  PT FREQUENCY: Biweekly  PT DURATION: 8 weeks  PLANNED INTERVENTIONS: Therapeutic exercises, Therapeutic activity, Neuromuscular re-education, Balance training, Gait training, Patient/Family education, Self Care, Joint mobilization, Dry  Needling, Electrical stimulation, Cryotherapy, and Moist heat Manual tx.  PLAN FOR NEXT SESSION:  Promote return to [employer].    Cammie Mcgee, PT, DPT # [phone] 12/08/2022, 7:46 PM

## 2022-12-11 ENCOUNTER — Ambulatory Visit
Admission: RE | Admit: 2022-12-11 | Discharge: 2022-12-11 | Disposition: A | Discharge status: Home / Self Care | Payer: 59 | Source: Ambulatory Visit | Attending: Physician Assistant | Admitting: Physician Assistant

## 2022-12-11 DIAGNOSIS — N644 Mastodynia: Secondary | ICD-10-CM | POA: Diagnosis present

## 2023-04-18 ENCOUNTER — Ambulatory Visit: Payer: 59 | Attending: Orthopedic Surgery | Admitting: Physical Therapy

## 2023-04-18 DIAGNOSIS — M25672 Stiffness of left ankle, not elsewhere classified: Secondary | ICD-10-CM | POA: Insufficient documentation

## 2023-04-18 DIAGNOSIS — M25572 Pain in left ankle and joints of left foot: Secondary | ICD-10-CM | POA: Insufficient documentation

## 2023-04-18 DIAGNOSIS — M25612 Stiffness of left shoulder, not elsewhere classified: Secondary | ICD-10-CM | POA: Diagnosis present

## 2023-04-18 DIAGNOSIS — M25512 Pain in left shoulder: Secondary | ICD-10-CM | POA: Insufficient documentation

## 2023-04-18 DIAGNOSIS — M6281 Muscle weakness (generalized): Secondary | ICD-10-CM | POA: Diagnosis present

## 2023-04-18 DIAGNOSIS — G8929 Other chronic pain: Secondary | ICD-10-CM | POA: Diagnosis present

## 2023-04-20 ENCOUNTER — Ambulatory Visit: Payer: 59 | Admitting: Physical Therapy

## 2023-04-20 DIAGNOSIS — M25572 Pain in left ankle and joints of left foot: Secondary | ICD-10-CM

## 2023-04-20 DIAGNOSIS — M25512 Pain in left shoulder: Secondary | ICD-10-CM | POA: Diagnosis not present

## 2023-04-20 DIAGNOSIS — M25612 Stiffness of left shoulder, not elsewhere classified: Secondary | ICD-10-CM

## 2023-04-20 DIAGNOSIS — G8929 Other chronic pain: Secondary | ICD-10-CM

## 2023-04-20 DIAGNOSIS — M25672 Stiffness of left ankle, not elsewhere classified: Secondary | ICD-10-CM

## 2023-04-20 DIAGNOSIS — M6281 Muscle weakness (generalized): Secondary | ICD-10-CM

## 2023-04-25 ENCOUNTER — Ambulatory Visit: Payer: 59 | Admitting: Physical Therapy

## 2023-04-25 DIAGNOSIS — G8929 Other chronic pain: Secondary | ICD-10-CM

## 2023-04-25 DIAGNOSIS — M25672 Stiffness of left ankle, not elsewhere classified: Secondary | ICD-10-CM

## 2023-04-25 DIAGNOSIS — M25572 Pain in left ankle and joints of left foot: Secondary | ICD-10-CM

## 2023-04-25 DIAGNOSIS — M25612 Stiffness of left shoulder, not elsewhere classified: Secondary | ICD-10-CM

## 2023-04-25 DIAGNOSIS — M6281 Muscle weakness (generalized): Secondary | ICD-10-CM

## 2023-04-25 DIAGNOSIS — M25512 Pain in left shoulder: Secondary | ICD-10-CM | POA: Diagnosis not present

## 2023-04-28 NOTE — Therapy (Signed)
  OUTPATIENT PHYSICAL THERAPY SHOULDER EVALUATION   Patient Name: Kelsey Bell MRN: [id number] DOB:[birth year], 42 y.o., female Today's Date: 04/18/23  END OF SESSION:  PT End of Session - 04/28/23 1414     Visit Number 1    Number of Visits 12    Date for PT Re-Evaluation 05/30/23    PT Start Time 0744    PT Stop Time 0828    PT Time Calculation (min) 44 min             Past Medical History:  Diagnosis Date   Allergy    Anxiety    Arthritis    Asthma    Breast pain present for several months   Bil LT >RT across of breasts   CRPS (complex regional pain syndrome type I)    Depression    DVT (deep venous thrombosis) (HCC) 06/2018   Fibromyalgia    Kidney stones    LGSIL on Pap smear of cervix 2007   Migraine    Neuromuscular disorder (HCC)    Complex regional pain syndrome and Fibromyalgia   Pancreatic pseudocyst    Pancreatitis    Past Surgical History:  Procedure Laterality Date   COLPOSCOPY  2017   neg bx   ERCP     EXTRACORPOREAL SHOCK WAVE LITHOTRIPSY Right 04/26/2017   Procedure: EXTRACORPOREAL SHOCK WAVE LITHOTRIPSY (ESWL);  Surgeon: Vanna Scotland, MD;  Location: [facility];  Service: Urology;  Laterality: Right;   GASTROSTOMY-JEJEUNOSTOMY TUBE CHANGE/PLACEMENT     KNEE ARTHROSCOPY Right    nj placement     TONSILLECTOMY     UPPER ESOPHAGEAL ENDOSCOPIC ULTRASOUND (EUS)     UPPER GASTROINTESTINAL ENDOSCOPY     Patient Active Problem List   Diagnosis Date Noted   Orthostatic syncope    Dehydration 06/09/2020   Genetic testing 10/24/2018   Idiopathic acute pancreatitis 07/22/2018   DVT (deep venous thrombosis) (HCC) 06/04/2018   Acute pancreatitis 05/27/2018   Right knee pain 05/19/2018   Peripheral edema 03/05/2018   Neuropathic pain 11/02/2017   Kidney stone 04/25/2017   Ureteral stone    Migraines 12/15/2016   Morbid obesity with BMI of 45.0-49.9, adult (HCC) 10/20/2015   Asthma, moderate 05/21/2015   Polyarthralgia 12/01/2011    Fibromyalgia 09/18/2011   Low back pain 09/18/2011    Cammie Mcgee, PT 04/28/2023, 2:17 PM

## 2023-04-28 NOTE — Therapy (Signed)
  OUTPATIENT PHYSICAL THERAPY SHOULDER/ ANKLE TREATMENT   Patient Name: Kelsey Bell MRN: [id number] DOB:[birth year], 42 y.o., female Today's Date: 04/25/23  END OF SESSION:  PT End of Session - 04/28/23 1434     Visit Number 3    Number of Visits 12    Date for PT Re-Evaluation 05/30/23    PT Start Time 0744    PT Stop Time 0830    PT Time Calculation (min) 46 min             Past Medical History:  Diagnosis Date   Allergy    Anxiety    Arthritis    Asthma    Breast pain present for several months   Bil LT >RT across of breasts   CRPS (complex regional pain syndrome type I)    Depression    DVT (deep venous thrombosis) (HCC) 06/2018   Fibromyalgia    Kidney stones    LGSIL on Pap smear of cervix 2007   Migraine    Neuromuscular disorder (HCC)    Complex regional pain syndrome and Fibromyalgia   Pancreatic pseudocyst    Pancreatitis    Past Surgical History:  Procedure Laterality Date   COLPOSCOPY  2017   neg bx   ERCP     EXTRACORPOREAL SHOCK WAVE LITHOTRIPSY Right 04/26/2017   Procedure: EXTRACORPOREAL SHOCK WAVE LITHOTRIPSY (ESWL);  Surgeon: Vanna Scotland, MD;  Location: [facility];  Service: Urology;  Laterality: Right;   GASTROSTOMY-JEJEUNOSTOMY TUBE CHANGE/PLACEMENT     KNEE ARTHROSCOPY Right    nj placement     TONSILLECTOMY     UPPER ESOPHAGEAL ENDOSCOPIC ULTRASOUND (EUS)     UPPER GASTROINTESTINAL ENDOSCOPY     Patient Active Problem List   Diagnosis Date Noted   Orthostatic syncope    Dehydration 06/09/2020   Genetic testing 10/24/2018   Idiopathic acute pancreatitis 07/22/2018   DVT (deep venous thrombosis) (HCC) 06/04/2018   Acute pancreatitis 05/27/2018   Right knee pain 05/19/2018   Peripheral edema 03/05/2018   Neuropathic pain 11/02/2017   Kidney stone 04/25/2017   Ureteral stone    Migraines 12/15/2016   Morbid obesity with BMI of 45.0-49.9, adult (HCC) 10/20/2015   Asthma, moderate 05/21/2015   Polyarthralgia 12/01/2011    Fibromyalgia 09/18/2011   Low back pain 09/18/2011    Cammie Mcgee, PT 04/28/2023, 2:35 PM

## 2023-04-28 NOTE — Therapy (Signed)
  OUTPATIENT PHYSICAL THERAPY SHOULDER/ ANKLE TREATMENT   Patient Name: Kelsey Bell MRN: [id number] DOB:1981-03-31, 42 y.o., female Today's Date: 04/20/23  END OF SESSION:  PT End of Session - 04/28/23 1420     Visit Number 2    Number of Visits 12    Date for PT Re-Evaluation 05/30/23    PT Start Time 1117    PT Stop Time 1204    PT Time Calculation (min) 47 min             Past Medical History:  Diagnosis Date   Allergy    Anxiety    Arthritis    Asthma    Breast pain present for several months   Bil LT >RT across of breasts   CRPS (complex regional pain syndrome type I)    Depression    DVT (deep venous thrombosis) (HCC) 06/2018   Fibromyalgia    Kidney stones    LGSIL on Pap smear of cervix 2007   Migraine    Neuromuscular disorder (HCC)    Complex regional pain syndrome and Fibromyalgia   Pancreatic pseudocyst    Pancreatitis    Past Surgical History:  Procedure Laterality Date   COLPOSCOPY  2017   neg bx   ERCP     EXTRACORPOREAL SHOCK WAVE LITHOTRIPSY Right 04/26/2017   Procedure: EXTRACORPOREAL SHOCK WAVE LITHOTRIPSY (ESWL);  Surgeon: Vanna Scotland, MD;  Location: [facility];  Service: Urology;  Laterality: Right;   GASTROSTOMY-JEJEUNOSTOMY TUBE CHANGE/PLACEMENT     KNEE ARTHROSCOPY Right    nj placement     TONSILLECTOMY     UPPER ESOPHAGEAL ENDOSCOPIC ULTRASOUND (EUS)     UPPER GASTROINTESTINAL ENDOSCOPY     Patient Active Problem List   Diagnosis Date Noted   Orthostatic syncope    Dehydration 06/09/2020   Genetic testing 10/24/2018   Idiopathic acute pancreatitis 07/22/2018   DVT (deep venous thrombosis) (HCC) 06/04/2018   Acute pancreatitis 05/27/2018   Right knee pain 05/19/2018   Peripheral edema 03/05/2018   Neuropathic pain 11/02/2017   Kidney stone 04/25/2017   Ureteral stone    Migraines 12/15/2016   Morbid obesity with BMI of 45.0-49.9, adult (HCC) 10/20/2015   Asthma, moderate 05/21/2015   Polyarthralgia 12/01/2011    Fibromyalgia 09/18/2011   Low back pain 09/18/2011    Cammie Mcgee, PT 04/28/2023, 2:21 PM

## 2023-05-02 ENCOUNTER — Ambulatory Visit: Payer: 59 | Admitting: Physical Therapy

## 2023-05-02 DIAGNOSIS — M25672 Stiffness of left ankle, not elsewhere classified: Secondary | ICD-10-CM

## 2023-05-02 DIAGNOSIS — G8929 Other chronic pain: Secondary | ICD-10-CM

## 2023-05-02 DIAGNOSIS — M25572 Pain in left ankle and joints of left foot: Secondary | ICD-10-CM

## 2023-05-02 DIAGNOSIS — M6281 Muscle weakness (generalized): Secondary | ICD-10-CM

## 2023-05-02 DIAGNOSIS — M25512 Pain in left shoulder: Secondary | ICD-10-CM | POA: Diagnosis not present

## 2023-05-02 DIAGNOSIS — M25612 Stiffness of left shoulder, not elsewhere classified: Secondary | ICD-10-CM

## 2023-05-05 NOTE — Therapy (Signed)
  OUTPATIENT PHYSICAL THERAPY SHOULDER/ ANKLE TREATMENT   Patient Name: DEIRDRA MIKLES MRN: [id number] DOB:[birth year], 42 y.o., female Today's Date: 05/02/23  END OF SESSION:  PT End of Session - 05/05/23 0731     Visit Number 4    Number of Visits 12    Date for PT Re-Evaluation 05/30/23    PT Start Time 0818    PT Stop Time 0906    PT Time Calculation (min) 48 min             Past Medical History:  Diagnosis Date   Allergy    Anxiety    Arthritis    Asthma    Breast pain present for several months   Bil LT >RT across of breasts   CRPS (complex regional pain syndrome type I)    Depression    DVT (deep venous thrombosis) (HCC) 06/2018   Fibromyalgia    Kidney stones    LGSIL on Pap smear of cervix 2007   Migraine    Neuromuscular disorder (HCC)    Complex regional pain syndrome and Fibromyalgia   Pancreatic pseudocyst    Pancreatitis    Past Surgical History:  Procedure Laterality Date   COLPOSCOPY  2017   neg bx   ERCP     EXTRACORPOREAL SHOCK WAVE LITHOTRIPSY Right 04/26/2017   Procedure: EXTRACORPOREAL SHOCK WAVE LITHOTRIPSY (ESWL);  Surgeon: Vanna Scotland, MD;  Location: [facility];  Service: Urology;  Laterality: Right;   GASTROSTOMY-JEJEUNOSTOMY TUBE CHANGE/PLACEMENT     KNEE ARTHROSCOPY Right    nj placement     TONSILLECTOMY     UPPER ESOPHAGEAL ENDOSCOPIC ULTRASOUND (EUS)     UPPER GASTROINTESTINAL ENDOSCOPY     Patient Active Problem List   Diagnosis Date Noted   Orthostatic syncope    Dehydration 06/09/2020   Genetic testing 10/24/2018   Idiopathic acute pancreatitis 07/22/2018   DVT (deep venous thrombosis) (HCC) 06/04/2018   Acute pancreatitis 05/27/2018   Right knee pain 05/19/2018   Peripheral edema 03/05/2018   Neuropathic pain 11/02/2017   Kidney stone 04/25/2017   Ureteral stone    Migraines 12/15/2016   Morbid obesity with BMI of 45.0-49.9, adult (HCC) 10/20/2015   Asthma, moderate 05/21/2015   Polyarthralgia 12/01/2011    Fibromyalgia 09/18/2011   Low back pain 09/18/2011    Cammie Mcgee, PT 05/05/2023, 7:32 AM

## 2023-05-11 ENCOUNTER — Ambulatory Visit: Payer: 59 | Attending: Orthopedic Surgery | Admitting: Physical Therapy

## 2023-05-11 DIAGNOSIS — M25612 Stiffness of left shoulder, not elsewhere classified: Secondary | ICD-10-CM | POA: Diagnosis present

## 2023-05-11 DIAGNOSIS — M6281 Muscle weakness (generalized): Secondary | ICD-10-CM | POA: Insufficient documentation

## 2023-05-11 DIAGNOSIS — M25672 Stiffness of left ankle, not elsewhere classified: Secondary | ICD-10-CM | POA: Insufficient documentation

## 2023-05-11 DIAGNOSIS — M25572 Pain in left ankle and joints of left foot: Secondary | ICD-10-CM | POA: Diagnosis present

## 2023-05-11 DIAGNOSIS — G8929 Other chronic pain: Secondary | ICD-10-CM | POA: Insufficient documentation

## 2023-05-11 DIAGNOSIS — M25512 Pain in left shoulder: Secondary | ICD-10-CM | POA: Diagnosis present

## 2023-05-12 NOTE — Therapy (Signed)
  OUTPATIENT PHYSICAL THERAPY SHOULDER/ ANKLE TREATMENT   Patient Name: RANIE KNOEDLER MRN: [id number] DOB:1981/06/27, 42 y.o., female Today's Date: 05/11/23  END OF SESSION:  PT End of Session - 05/12/23 1708     Visit Number 5    Number of Visits 12    Date for PT Re-Evaluation 05/30/23    PT Start Time 0945    PT Stop Time 1031    PT Time Calculation (min) 46 min             Past Medical History:  Diagnosis Date   Allergy    Anxiety    Arthritis    Asthma    Breast pain present for several months   Bil LT >RT across of breasts   CRPS (complex regional pain syndrome type I)    Depression    DVT (deep venous thrombosis) (HCC) 06/2018   Fibromyalgia    Kidney stones    LGSIL on Pap smear of cervix 2007   Migraine    Neuromuscular disorder (HCC)    Complex regional pain syndrome and Fibromyalgia   Pancreatic pseudocyst    Pancreatitis    Past Surgical History:  Procedure Laterality Date   COLPOSCOPY  2017   neg bx   ERCP     EXTRACORPOREAL SHOCK WAVE LITHOTRIPSY Right 04/26/2017   Procedure: EXTRACORPOREAL SHOCK WAVE LITHOTRIPSY (ESWL);  Surgeon: Vanna Scotland, MD;  Location: [facility];  Service: Urology;  Laterality: Right;   GASTROSTOMY-JEJEUNOSTOMY TUBE CHANGE/PLACEMENT     KNEE ARTHROSCOPY Right    nj placement     TONSILLECTOMY     UPPER ESOPHAGEAL ENDOSCOPIC ULTRASOUND (EUS)     UPPER GASTROINTESTINAL ENDOSCOPY     Patient Active Problem List   Diagnosis Date Noted   Orthostatic syncope    Dehydration 06/09/2020   Genetic testing 10/24/2018   Idiopathic acute pancreatitis 07/22/2018   DVT (deep venous thrombosis) (HCC) 06/04/2018   Acute pancreatitis 05/27/2018   Right knee pain 05/19/2018   Peripheral edema 03/05/2018   Neuropathic pain 11/02/2017   Kidney stone 04/25/2017   Ureteral stone    Migraines 12/15/2016   Morbid obesity with BMI of 45.0-49.9, adult (HCC) 10/20/2015   Asthma, moderate 05/21/2015   Polyarthralgia 12/01/2011    Fibromyalgia 09/18/2011   Low back pain 09/18/2011    Cammie Mcgee, PT 05/12/2023, 5:09 PM

## 2023-05-18 ENCOUNTER — Ambulatory Visit: Payer: 59 | Admitting: Physical Therapy

## 2023-05-25 ENCOUNTER — Ambulatory Visit: Payer: 59 | Admitting: Physical Therapy

## 2023-05-25 DIAGNOSIS — M25512 Pain in left shoulder: Secondary | ICD-10-CM | POA: Diagnosis not present

## 2023-05-25 DIAGNOSIS — M6281 Muscle weakness (generalized): Secondary | ICD-10-CM

## 2023-05-25 DIAGNOSIS — M25572 Pain in left ankle and joints of left foot: Secondary | ICD-10-CM

## 2023-05-25 DIAGNOSIS — M25672 Stiffness of left ankle, not elsewhere classified: Secondary | ICD-10-CM

## 2023-05-25 DIAGNOSIS — G8929 Other chronic pain: Secondary | ICD-10-CM

## 2023-05-25 DIAGNOSIS — M25612 Stiffness of left shoulder, not elsewhere classified: Secondary | ICD-10-CM

## 2023-05-26 NOTE — Therapy (Signed)
OUTPATIENT PHYSICAL THERAPY SHOULDER/ ANKLE TREATMENT   Patient Name: Kelsey Bell MRN: [id number] DOB:29-Jun-1981, 42 y.o., female Today's Date: 05/25/23  END OF SESSION:  PT End of Session - 05/26/23 1557     Visit Number 6    Number of Visits 12    Date for PT Re-Evaluation 05/30/23    PT Start Time 0811    PT Stop Time 0910    PT Time Calculation (min) 59 min             Past Medical History:  Diagnosis Date   Allergy    Anxiety    Arthritis    Asthma    Breast pain present for several months   Bil LT >RT across of breasts   CRPS (complex regional pain syndrome type I)    Depression    DVT (deep venous thrombosis) (HCC) 06/2018   Fibromyalgia    Kidney stones    LGSIL on Pap smear of cervix 2007   Migraine    Neuromuscular disorder (HCC)    Complex regional pain syndrome and Fibromyalgia   Pancreatic pseudocyst    Pancreatitis    Past Surgical History:  Procedure Laterality Date   COLPOSCOPY  2017   neg bx   ERCP     EXTRACORPOREAL SHOCK WAVE LITHOTRIPSY Right 04/26/2017   Procedure: EXTRACORPOREAL SHOCK WAVE LITHOTRIPSY (ESWL);  Surgeon: Vanna Scotland, MD;  Location: [facility];  Service: Urology;  Laterality: Right;   GASTROSTOMY-JEJEUNOSTOMY TUBE CHANGE/PLACEMENT     KNEE ARTHROSCOPY Right    nj placement     TONSILLECTOMY     UPPER ESOPHAGEAL ENDOSCOPIC ULTRASOUND (EUS)     UPPER GASTROINTESTINAL ENDOSCOPY     Patient Active Problem List   Diagnosis Date Noted   Orthostatic syncope    Dehydration 06/09/2020   Genetic testing 10/24/2018   Idiopathic acute pancreatitis 07/22/2018   DVT (deep venous thrombosis) (HCC) 06/04/2018   Acute pancreatitis 05/27/2018   Right knee pain 05/19/2018   Peripheral edema 03/05/2018   Neuropathic pain 11/02/2017   Kidney stone 04/25/2017   Ureteral stone    Migraines 12/15/2016   Morbid obesity with BMI of 45.0-49.9, adult (HCC) 10/20/2015   Asthma, moderate 05/21/2015   Polyarthralgia 12/01/2011    Fibromyalgia 09/18/2011   Low back pain 09/18/2011    Cammie Mcgee, PT 05/26/2023, 3:58 PM

## 2023-05-28 ENCOUNTER — Ambulatory Visit: Payer: 59 | Admitting: Physical Therapy

## 2023-05-29 ENCOUNTER — Ambulatory Visit: Payer: 59 | Admitting: Physical Therapy

## 2023-05-29 DIAGNOSIS — M25572 Pain in left ankle and joints of left foot: Secondary | ICD-10-CM

## 2023-05-29 DIAGNOSIS — M6281 Muscle weakness (generalized): Secondary | ICD-10-CM

## 2023-05-29 DIAGNOSIS — M25512 Pain in left shoulder: Secondary | ICD-10-CM | POA: Diagnosis not present

## 2023-05-29 DIAGNOSIS — G8929 Other chronic pain: Secondary | ICD-10-CM

## 2023-05-29 DIAGNOSIS — M25612 Stiffness of left shoulder, not elsewhere classified: Secondary | ICD-10-CM

## 2023-06-04 NOTE — Therapy (Signed)
  OUTPATIENT PHYSICAL THERAPY SHOULDER/ ANKLE TREATMENT   Patient Name: Kelsey Bell MRN: [id number] DOB:1981/05/01, 42 y.o., female Today's Date: 05/29/23  END OF SESSION:  PT End of Session - 06/04/23 1609     Visit Number 7    Number of Visits 12    Date for PT Re-Evaluation 05/30/23    PT Start Time 0728    PT Stop Time 0820    PT Time Calculation (min) 52 min             Past Medical History:  Diagnosis Date   Allergy    Anxiety    Arthritis    Asthma    Breast pain present for several months   Bil LT >RT across of breasts   CRPS (complex regional pain syndrome type I)    Depression    DVT (deep venous thrombosis) (HCC) 06/2018   Fibromyalgia    Kidney stones    LGSIL on Pap smear of cervix 2007   Migraine    Neuromuscular disorder (HCC)    Complex regional pain syndrome and Fibromyalgia   Pancreatic pseudocyst    Pancreatitis    Past Surgical History:  Procedure Laterality Date   COLPOSCOPY  2017   neg bx   ERCP     EXTRACORPOREAL SHOCK WAVE LITHOTRIPSY Right 04/26/2017   Procedure: EXTRACORPOREAL SHOCK WAVE LITHOTRIPSY (ESWL);  Surgeon: Vanna Scotland, MD;  Location: [facility];  Service: Urology;  Laterality: Right;   GASTROSTOMY-JEJEUNOSTOMY TUBE CHANGE/PLACEMENT     KNEE ARTHROSCOPY Right    nj placement     TONSILLECTOMY     UPPER ESOPHAGEAL ENDOSCOPIC ULTRASOUND (EUS)     UPPER GASTROINTESTINAL ENDOSCOPY     Patient Active Problem List   Diagnosis Date Noted   Orthostatic syncope    Dehydration 06/09/2020   Genetic testing 10/24/2018   Idiopathic acute pancreatitis 07/22/2018   DVT (deep venous thrombosis) (HCC) 06/04/2018   Acute pancreatitis 05/27/2018   Right knee pain 05/19/2018   Peripheral edema 03/05/2018   Neuropathic pain 11/02/2017   Kidney stone 04/25/2017   Ureteral stone    Migraines 12/15/2016   Morbid obesity with BMI of 45.0-49.9, adult (HCC) 10/20/2015   Asthma, moderate 05/21/2015   Polyarthralgia 12/01/2011    Fibromyalgia 09/18/2011   Low back pain 09/18/2011    Cammie Mcgee, PT 06/04/2023, 4:10 PM

## 2023-06-06 ENCOUNTER — Ambulatory Visit: Payer: 59 | Admitting: Physical Therapy

## 2023-06-07 ENCOUNTER — Ambulatory Visit: Payer: 59 | Attending: Orthopedic Surgery | Admitting: Physical Therapy

## 2023-06-07 DIAGNOSIS — M25572 Pain in left ankle and joints of left foot: Secondary | ICD-10-CM | POA: Insufficient documentation

## 2023-06-07 DIAGNOSIS — M6281 Muscle weakness (generalized): Secondary | ICD-10-CM | POA: Diagnosis present

## 2023-06-07 DIAGNOSIS — M25672 Stiffness of left ankle, not elsewhere classified: Secondary | ICD-10-CM | POA: Diagnosis present

## 2023-06-07 DIAGNOSIS — M25612 Stiffness of left shoulder, not elsewhere classified: Secondary | ICD-10-CM | POA: Insufficient documentation

## 2023-06-07 DIAGNOSIS — G8929 Other chronic pain: Secondary | ICD-10-CM | POA: Insufficient documentation

## 2023-06-07 DIAGNOSIS — M25512 Pain in left shoulder: Secondary | ICD-10-CM | POA: Diagnosis present

## 2023-06-08 ENCOUNTER — Encounter: Payer: 59 | Admitting: Physical Therapy

## 2023-06-09 NOTE — Therapy (Signed)
  OUTPATIENT PHYSICAL THERAPY SHOULDER/ ANKLE TREATMENT/RECERTIFICATION   Patient Name: Kelsey Bell MRN: [id number] DOB:[birth year], 42 y.o., female Today's Date: 06/07/23  END OF SESSION:  PT End of Session - 06/09/23 1350     Visit Number 8    Number of Visits 16    Date for PT Re-Evaluation 08/02/23    PT Start Time 0732    PT Stop Time 0816    PT Time Calculation (min) 44 min             Past Medical History:  Diagnosis Date   Allergy    Anxiety    Arthritis    Asthma    Breast pain present for several months   Bil LT >RT across of breasts   CRPS (complex regional pain syndrome type I)    Depression    DVT (deep venous thrombosis) (HCC) 06/2018   Fibromyalgia    Kidney stones    LGSIL on Pap smear of cervix 2007   Migraine    Neuromuscular disorder (HCC)    Complex regional pain syndrome and Fibromyalgia   Pancreatic pseudocyst    Pancreatitis    Past Surgical History:  Procedure Laterality Date   COLPOSCOPY  2017   neg bx   ERCP     EXTRACORPOREAL SHOCK WAVE LITHOTRIPSY Right 04/26/2017   Procedure: EXTRACORPOREAL SHOCK WAVE LITHOTRIPSY (ESWL);  Surgeon: Vanna Scotland, MD;  Location: [facility];  Service: Urology;  Laterality: Right;   GASTROSTOMY-JEJEUNOSTOMY TUBE CHANGE/PLACEMENT     KNEE ARTHROSCOPY Right    nj placement     TONSILLECTOMY     UPPER ESOPHAGEAL ENDOSCOPIC ULTRASOUND (EUS)     UPPER GASTROINTESTINAL ENDOSCOPY     Patient Active Problem List   Diagnosis Date Noted   Orthostatic syncope    Dehydration 06/09/2020   Genetic testing 10/24/2018   Idiopathic acute pancreatitis 07/22/2018   DVT (deep venous thrombosis) (HCC) 06/04/2018   Acute pancreatitis 05/27/2018   Right knee pain 05/19/2018   Peripheral edema 03/05/2018   Neuropathic pain 11/02/2017   Kidney stone 04/25/2017   Ureteral stone    Migraines 12/15/2016   Morbid obesity with BMI of 45.0-49.9, adult (HCC) 10/20/2015   Asthma, moderate 05/21/2015   Polyarthralgia  12/01/2011   Fibromyalgia 09/18/2011   Low back pain 09/18/2011    Cammie Mcgee, PT 06/09/2023, 1:52 PM

## 2023-06-13 ENCOUNTER — Ambulatory Visit: Payer: 59 | Admitting: Physical Therapy

## 2023-06-15 ENCOUNTER — Ambulatory Visit: Payer: 59 | Admitting: Physical Therapy

## 2023-06-15 DIAGNOSIS — M25512 Pain in left shoulder: Secondary | ICD-10-CM | POA: Diagnosis not present

## 2023-06-15 DIAGNOSIS — G8929 Other chronic pain: Secondary | ICD-10-CM

## 2023-06-15 DIAGNOSIS — M6281 Muscle weakness (generalized): Secondary | ICD-10-CM

## 2023-06-15 DIAGNOSIS — M25612 Stiffness of left shoulder, not elsewhere classified: Secondary | ICD-10-CM

## 2023-06-20 ENCOUNTER — Ambulatory Visit: Payer: 59 | Admitting: Physical Therapy

## 2023-06-20 DIAGNOSIS — M25512 Pain in left shoulder: Secondary | ICD-10-CM | POA: Diagnosis not present

## 2023-06-20 DIAGNOSIS — M25612 Stiffness of left shoulder, not elsewhere classified: Secondary | ICD-10-CM

## 2023-06-20 DIAGNOSIS — G8929 Other chronic pain: Secondary | ICD-10-CM

## 2023-06-20 DIAGNOSIS — M6281 Muscle weakness (generalized): Secondary | ICD-10-CM

## 2023-06-23 NOTE — Therapy (Signed)
  OUTPATIENT PHYSICAL THERAPY SHOULDER/ ANKLE TREATMENT Physical Therapy Progress Note  Dates of reporting period  04/18/23   to   06/20/23    Patient Name: ADRINNE Bell MRN: [id number] DOB:01/05/1981, 42 y.o., female Today's Date: 06/20/23  END OF SESSION:  PT End of Session - 06/23/23 1924     Visit Number 10    Number of Visits 16    Date for PT Re-Evaluation 08/02/23    PT Start Time 1256    PT Stop Time 1350    PT Time Calculation (min) 54 min             Past Medical History:  Diagnosis Date   Allergy    Anxiety    Arthritis    Asthma    Breast pain present for several months   Bil LT >RT across of breasts   CRPS (complex regional pain syndrome type I)    Depression    DVT (deep venous thrombosis) (HCC) 06/2018   Fibromyalgia    Kidney stones    LGSIL on Pap smear of cervix 2007   Migraine    Neuromuscular disorder (HCC)    Complex regional pain syndrome and Fibromyalgia   Pancreatic pseudocyst    Pancreatitis    Past Surgical History:  Procedure Laterality Date   COLPOSCOPY  2017   neg bx   ERCP     EXTRACORPOREAL SHOCK WAVE LITHOTRIPSY Right 04/26/2017   Procedure: EXTRACORPOREAL SHOCK WAVE LITHOTRIPSY (ESWL);  Surgeon: Vanna Scotland, MD;  Location: [facility];  Service: Urology;  Laterality: Right;   GASTROSTOMY-JEJEUNOSTOMY TUBE CHANGE/PLACEMENT     KNEE ARTHROSCOPY Right    nj placement     TONSILLECTOMY     UPPER ESOPHAGEAL ENDOSCOPIC ULTRASOUND (EUS)     UPPER GASTROINTESTINAL ENDOSCOPY     Patient Active Problem List   Diagnosis Date Noted   Orthostatic syncope    Dehydration 06/09/2020   Genetic testing 10/24/2018   Idiopathic acute pancreatitis 07/22/2018   DVT (deep venous thrombosis) (HCC) 06/04/2018   Acute pancreatitis 05/27/2018   Right knee pain 05/19/2018   Peripheral edema 03/05/2018   Neuropathic pain 11/02/2017   Kidney stone 04/25/2017   Ureteral stone    Migraines 12/15/2016   Morbid obesity with BMI of 45.0-49.9,  adult (HCC) 10/20/2015   Asthma, moderate 05/21/2015   Polyarthralgia 12/01/2011   Fibromyalgia 09/18/2011   Low back pain 09/18/2011    Cammie Mcgee, PT 06/23/2023, 7:26 PM

## 2023-06-23 NOTE — Therapy (Signed)
  OUTPATIENT PHYSICAL THERAPY SHOULDER/ ANKLE TREATMENT   Patient Name: Kelsey Bell MRN: [id number] DOB:23-Aug-1981, 42 y.o., female Today's Date: 06/15/23  END OF SESSION:  PT End of Session - 06/23/23 1721     Visit Number 9    Number of Visits 16    Date for PT Re-Evaluation 08/02/23    PT Start Time 1258    PT Stop Time 1347    PT Time Calculation (min) 49 min             Past Medical History:  Diagnosis Date   Allergy    Anxiety    Arthritis    Asthma    Breast pain present for several months   Bil LT >RT across of breasts   CRPS (complex regional pain syndrome type I)    Depression    DVT (deep venous thrombosis) (HCC) 06/2018   Fibromyalgia    Kidney stones    LGSIL on Pap smear of cervix 2007   Migraine    Neuromuscular disorder (HCC)    Complex regional pain syndrome and Fibromyalgia   Pancreatic pseudocyst    Pancreatitis    Past Surgical History:  Procedure Laterality Date   COLPOSCOPY  2017   neg bx   ERCP     EXTRACORPOREAL SHOCK WAVE LITHOTRIPSY Right 04/26/2017   Procedure: EXTRACORPOREAL SHOCK WAVE LITHOTRIPSY (ESWL);  Surgeon: Vanna Scotland, MD;  Location: [facility];  Service: Urology;  Laterality: Right;   GASTROSTOMY-JEJEUNOSTOMY TUBE CHANGE/PLACEMENT     KNEE ARTHROSCOPY Right    nj placement     TONSILLECTOMY     UPPER ESOPHAGEAL ENDOSCOPIC ULTRASOUND (EUS)     UPPER GASTROINTESTINAL ENDOSCOPY     Patient Active Problem List   Diagnosis Date Noted   Orthostatic syncope    Dehydration 06/09/2020   Genetic testing 10/24/2018   Idiopathic acute pancreatitis 07/22/2018   DVT (deep venous thrombosis) (HCC) 06/04/2018   Acute pancreatitis 05/27/2018   Right knee pain 05/19/2018   Peripheral edema 03/05/2018   Neuropathic pain 11/02/2017   Kidney stone 04/25/2017   Ureteral stone    Migraines 12/15/2016   Morbid obesity with BMI of 45.0-49.9, adult (HCC) 10/20/2015   Asthma, moderate 05/21/2015   Polyarthralgia 12/01/2011    Fibromyalgia 09/18/2011   Low back pain 09/18/2011    Cammie Mcgee, PT 06/23/2023, 5:22 PM

## 2023-06-29 ENCOUNTER — Ambulatory Visit: Payer: 59 | Admitting: Physical Therapy

## 2023-07-06 ENCOUNTER — Ambulatory Visit: Payer: 59 | Admitting: Physical Therapy

## 2023-07-13 ENCOUNTER — Ambulatory Visit: Payer: 59 | Attending: Orthopedic Surgery | Admitting: Physical Therapy

## 2023-07-13 DIAGNOSIS — G8929 Other chronic pain: Secondary | ICD-10-CM | POA: Insufficient documentation

## 2023-07-13 DIAGNOSIS — M25612 Stiffness of left shoulder, not elsewhere classified: Secondary | ICD-10-CM | POA: Diagnosis present

## 2023-07-13 DIAGNOSIS — M6281 Muscle weakness (generalized): Secondary | ICD-10-CM | POA: Diagnosis present

## 2023-07-13 DIAGNOSIS — M25512 Pain in left shoulder: Secondary | ICD-10-CM | POA: Insufficient documentation

## 2023-07-21 NOTE — Therapy (Signed)
  OUTPATIENT PHYSICAL THERAPY SHOULDER/ ANKLE TREATMENT  Patient Name: Kelsey Bell MRN: [id number] DOB:[birth year], 42 y.o., female Today's Date: 07/13/23  END OF SESSION:  PT End of Session - 07/21/23 1548     Visit Number 11    Number of Visits 16    Date for PT Re-Evaluation 08/02/23    PT Start Time 0910    PT Stop Time 1000    PT Time Calculation (min) 50 min             Past Medical History:  Diagnosis Date   Allergy    Anxiety    Arthritis    Asthma    Breast pain present for several months   Bil LT >RT across of breasts   CRPS (complex regional pain syndrome type I)    Depression    DVT (deep venous thrombosis) (HCC) 06/2018   Fibromyalgia    Kidney stones    LGSIL on Pap smear of cervix 2007   Migraine    Neuromuscular disorder (HCC)    Complex regional pain syndrome and Fibromyalgia   Pancreatic pseudocyst    Pancreatitis    Past Surgical History:  Procedure Laterality Date   COLPOSCOPY  2017   neg bx   ERCP     EXTRACORPOREAL SHOCK WAVE LITHOTRIPSY Right 04/26/2017   Procedure: EXTRACORPOREAL SHOCK WAVE LITHOTRIPSY (ESWL);  Surgeon: Vanna Scotland, MD;  Location: [facility];  Service: Urology;  Laterality: Right;   GASTROSTOMY-JEJEUNOSTOMY TUBE CHANGE/PLACEMENT     KNEE ARTHROSCOPY Right    nj placement     TONSILLECTOMY     UPPER ESOPHAGEAL ENDOSCOPIC ULTRASOUND (EUS)     UPPER GASTROINTESTINAL ENDOSCOPY     Patient Active Problem List   Diagnosis Date Noted   Orthostatic syncope    Dehydration 06/09/2020   Genetic testing 10/24/2018   Idiopathic acute pancreatitis 07/22/2018   DVT (deep venous thrombosis) (HCC) 06/04/2018   Acute pancreatitis 05/27/2018   Right knee pain 05/19/2018   Peripheral edema 03/05/2018   Neuropathic pain 11/02/2017   Kidney stone 04/25/2017   Ureteral stone    Migraines 12/15/2016   Morbid obesity with BMI of 45.0-49.9, adult (HCC) 10/20/2015   Asthma, moderate 05/21/2015   Polyarthralgia 12/01/2011    Fibromyalgia 09/18/2011   Low back pain 09/18/2011    Cammie Mcgee, PT 07/21/2023, 3:49 PM

## 2023-07-27 ENCOUNTER — Encounter: Payer: 59 | Admitting: Physical Therapy

## 2023-10-17 ENCOUNTER — Ambulatory Visit: Payer: 59 | Admitting: Physical Therapy

## 2023-10-17 ENCOUNTER — Ambulatory Visit: Payer: 59 | Attending: Orthopedic Surgery | Admitting: Physical Therapy

## 2023-10-17 ENCOUNTER — Encounter: Payer: Self-pay | Admitting: Physical Therapy

## 2023-10-17 DIAGNOSIS — M25512 Pain in left shoulder: Secondary | ICD-10-CM | POA: Diagnosis present

## 2023-10-17 DIAGNOSIS — M6281 Muscle weakness (generalized): Secondary | ICD-10-CM | POA: Insufficient documentation

## 2023-10-17 DIAGNOSIS — M25612 Stiffness of left shoulder, not elsewhere classified: Secondary | ICD-10-CM | POA: Diagnosis present

## 2023-10-17 DIAGNOSIS — G8929 Other chronic pain: Secondary | ICD-10-CM | POA: Diagnosis present

## 2023-10-17 NOTE — Therapy (Signed)
OUTPATIENT PHYSICAL THERAPY SHOULDER EVALUATION  Patient Name: Kelsey Bell MRN: [id number] DOB:1980-09-28, 43 y.o., female Today's Date: 10/17/2023  END OF SESSION:  PT End of Session - 10/17/23 1051     Visit Number 1    Number of Visits 12    Date for PT Re-Evaluation 11/28/23    PT Start Time 1109            1109 to 1202  (53 minutes).    Past Medical History:  Diagnosis Date   Allergy    Anxiety    Arthritis    Asthma    Breast pain present for several months   Bil LT >RT across of breasts   CRPS (complex regional pain syndrome type I)    Depression    DVT (deep venous thrombosis) (HCC) 06/2018   Fibromyalgia    Kidney stones    LGSIL on Pap smear of cervix 2007   Migraine    Neuromuscular disorder (HCC)    Complex regional pain syndrome and Fibromyalgia   Pancreatic pseudocyst    Pancreatitis    Past Surgical History:  Procedure Laterality Date   COLPOSCOPY  2017   neg bx   ERCP     EXTRACORPOREAL SHOCK WAVE LITHOTRIPSY Right 04/26/2017   Procedure: EXTRACORPOREAL SHOCK WAVE LITHOTRIPSY (ESWL);  Surgeon: Vanna Scotland, MD;  Location: [facility];  Service: Urology;  Laterality: Right;   GASTROSTOMY-JEJEUNOSTOMY TUBE CHANGE/PLACEMENT     KNEE ARTHROSCOPY Right    nj placement     TONSILLECTOMY     UPPER ESOPHAGEAL ENDOSCOPIC ULTRASOUND (EUS)     UPPER GASTROINTESTINAL ENDOSCOPY     Patient Active Problem List   Diagnosis Date Noted   Orthostatic syncope    Dehydration 06/09/2020   Genetic testing 10/24/2018   Idiopathic acute pancreatitis 07/22/2018   DVT (deep venous thrombosis) (HCC) 06/04/2018   Acute pancreatitis 05/27/2018   Right knee pain 05/19/2018   Peripheral edema 03/05/2018   Neuropathic pain 11/02/2017   Kidney stone 04/25/2017   Ureteral stone    Migraines 12/15/2016   Morbid obesity with BMI of 45.0-49.9, adult (HCC) 10/20/2015   Asthma, moderate 05/21/2015   Polyarthralgia 12/01/2011   Fibromyalgia 09/18/2011   Low back  pain 09/18/2011   PCP: Titus Mould, NP  REFERRING PROVIDER: Virgina Organ, MD  REFERRING DIAG:  M75.02 (ICD-10-CM) - Adhesive capsulitis of left shoulder  M25.512 (ICD-10-CM) - Acute pain of left shoulder  M25.571 (ICD-10-CM) - Acute right ankle pain   THERAPY DIAG:  Chronic left shoulder pain  Shoulder joint stiffness, left  Muscle weakness (generalized)  Rationale for Evaluation and Treatment: Rehabilitation  ONSET DATE: Chronic  SUBJECTIVE:  SUBJECTIVE STATEMENT: Pt. Known well to PT clinic and has been treated for L shoulder limitations/ pain in past with benefit.  Pt. Has been participating with gym based exercise program at [employer] on a regular basis.  Pt. Reports L shoulder is "spicy" with overhead/ lifting tasks and pain persists.   Hand dominance: Left  PERTINENT HISTORY: See MD notes and extensive medical history.    PAIN:  Are you having pain? Yes: NPRS scale: 5 Pain location: L shoulder Pain description: spicy/ dull aching pain Aggravating factors: Overhead reaching Relieving factors: Rest  PRECAUTIONS: None  RED FLAGS: None   WEIGHT BEARING RESTRICTIONS: No  FALLS:  Has patient fallen in last 6 months? No  LIVING ENVIRONMENT: Lives with: lives alone Lives in: House/apartment Stairs: No Has following equipment at home: None  OCCUPATION: On disability  PLOF: Independent  PATIENT GOALS:  Increase L shoulder strength/ pain-free functional mobility.    NEXT MD VISIT: PRN  OBJECTIVE:  Note: Objective measures were completed at Evaluation unless otherwise noted.  DIAGNOSTIC FINDINGS:  No recent imaging  PATIENT SURVEYS:  LEFS 68/80 and Quick Dash 40.9%  COGNITION: Overall cognitive status: Within functional limits for tasks  assessed     SENSATION: WFL  POSTURE: Rounded shoulder/ forward head/ decreased lumbar lordosis posture  UPPER EXTREMITY ROM:   Active ROM Right eval Left eval  Shoulder flexion 169 deg. 172 deg.   Shoulder extension 48 deg. 47 deg.  Shoulder abduction 187 deg. 185 deg.  Shoulder adduction    Shoulder internal rotation 83 deg. 68 deg.  Shoulder external rotation 97 deg. 95 deg.  Elbow flexion    Elbow extension    Wrist flexion    Wrist extension    Wrist ulnar deviation    Wrist radial deviation    Wrist pronation    Wrist supination    (Blank rows = not tested)  UPPER EXTREMITY MMT:  MMT Right eval Left eval  Shoulder flexion 4 4  Shoulder extension 3+ 3+  Shoulder abduction 4 4  Shoulder adduction 4 4  Shoulder internal rotation 4 4  Shoulder external rotation 4 4  Middle trapezius 4 4  Lower trapezius 3 3  Elbow flexion 4 4  Elbow extension 4 4  Wrist flexion    Wrist extension    Wrist ulnar deviation    Wrist radial deviation    Wrist pronation    Wrist supination    Grip strength (lbs)    (Blank rows = not tested)  SHOULDER SPECIAL TESTS: Impingement tests: Neer impingement test: negative and Hawkins/Kennedy impingement test: negative SLAP lesions: NT Instability tests: NT Rotator cuff assessment: Empty can test: negative Biceps assessment: NT  JOINT MOBILITY TESTING:  R shoulder grade IV AP mob.- no pain.  R shoulder inferior mobs- 4/10 pain reported.  L shoulder grade IV AP mob.- no pain.  L shoulder inf. Glides (no pain).  Decrease L shoulder symptoms with PA mobs./ glides.    PALPATION:  L shoulder/ infraspinatus tenderness with palpation.    No assessment of ankle/LE secondary to complaints at this time.  Evaluation focus on L UE.  TREATMENT DATE: 10/17/2023  See evaluation  PATIENT EDUCATION: Education details:  L shoulder strength deficits/ special tests Person educated: Patient Education method: Explanation and Demonstration Education comprehension: verbalized understanding and returned demonstration  HOME EXERCISE PROGRAM: Will issue gym based ex. Next tx. Session.   ASSESSMENT:  CLINICAL IMPRESSION: Patient is a pleasant 43 y.o. female who was seen today for physical therapy evaluation and treatment for L shoulder pain/ strength deficits.  Pt. Reports persistent L shoulder pain "spicy" with overhead reaching/ lifting tasks.  Pt. Presents with generalized B UE muscle weakness and pain with L shoulder IR/ end-range flexion and abduction.  Pt. Negative for impingement testing.  Pt. Will benefit from skilled PT services to increase L shoulder ROM/ strength to improve pain-free functional mobility with daily tasks/ gym based ex. Program.   OBJECTIVE IMPAIRMENTS: decreased activity tolerance, decreased endurance, decreased ROM, decreased strength, hypomobility, impaired perceived functional ability, impaired flexibility, impaired UE functional use, improper body mechanics, postural dysfunction, and pain.   ACTIVITY LIMITATIONS: carrying, lifting, and reach over head  PARTICIPATION LIMITATIONS: cleaning, laundry, community activity, and yard work  PERSONAL FACTORS: Fitness and Past/current experiences are also affecting patient's functional outcome.   REHAB POTENTIAL: Good  CLINICAL DECISION MAKING: Evolving/moderate complexity  EVALUATION COMPLEXITY: Moderate  GOALS: Goals reviewed with patient? Yes  SHORT TERM GOALS: Target date: 11/07/23  Pt. Will be independent with gym based/ HEP to increase L shoulder IR to WNL as compared to R shoulder to improve mobility.   Baseline:  see above Goal status: INITIAL  2.  Pt. Will report no tenderness over L anterior deltoid/ infraspinatus muscle to improve pain-free mobility.   Baseline: (+) tenderness Goal status: INITIAL  LONG TERM GOALS: Target  date: 11/28/23  Pt. Will decrease QuickDASH to <20% to improve pain-free mobility.   Baseline:  QuickDASH: 40.9% Goal status: INITIAL  2.  Pt. Will increase B UE muscle strength 1/2 muscle grade to improve pain-free mobility.   Baseline:  see above Goal status: INITIAL  3.  Pt. Able to complete UE gym based ex. Program at [employer] with no increase c/o sh. Pain or limitations.   Baseline:  pain limited with lifting Goal status: INITIAL  PLAN:  PT FREQUENCY: 1-2x/week  PT DURATION: 6 weeks  PLANNED INTERVENTIONS: 97110-Therapeutic exercises, 97530- Therapeutic activity, 97112- Neuromuscular re-education, 97535- Self Care, [id number]- Manual therapy, Patient/Family education, Dry Needling, Joint mobilization, Cryotherapy, and Moist heat  PLAN FOR NEXT SESSION: Issue specific UE gym based ex. Program for [employer].  Cammie Mcgee, PT, DPT # [id number] 10/17/2023, 11:19 AM

## 2023-10-26 ENCOUNTER — Encounter: Payer: Self-pay | Admitting: Physical Therapy

## 2023-10-26 ENCOUNTER — Ambulatory Visit: Payer: 59 | Admitting: Physical Therapy

## 2023-10-26 DIAGNOSIS — G8929 Other chronic pain: Secondary | ICD-10-CM

## 2023-10-26 DIAGNOSIS — M6281 Muscle weakness (generalized): Secondary | ICD-10-CM

## 2023-10-26 DIAGNOSIS — M25512 Pain in left shoulder: Secondary | ICD-10-CM | POA: Diagnosis not present

## 2023-10-26 DIAGNOSIS — M25612 Stiffness of left shoulder, not elsewhere classified: Secondary | ICD-10-CM

## 2023-10-26 NOTE — Therapy (Signed)
 OUTPATIENT PHYSICAL THERAPY SHOULDER TREATMENT  Patient Name: Kelsey Bell MRN: [id number] DOB:05/07/1981, 43 y.o., female Today's Date: 10/27/2023  END OF SESSION:  PT End of Session - 10/26/23 0911     Visit Number 2    Number of Visits 12    Date for PT Re-Evaluation 11/28/23    PT Start Time 1038    PT Stop Time 1118    PT Time Calculation (min) 40 min             Past Medical History:  Diagnosis Date   Allergy    Anxiety    Arthritis    Asthma    Breast pain present for several months   Bil LT >RT across of breasts   CRPS (complex regional pain syndrome type I)    Depression    DVT (deep venous thrombosis) (HCC) 06/2018   Fibromyalgia    Kidney stones    LGSIL on Pap smear of cervix 2007   Migraine    Neuromuscular disorder (HCC)    Complex regional pain syndrome and Fibromyalgia   Pancreatic pseudocyst    Pancreatitis    Past Surgical History:  Procedure Laterality Date   COLPOSCOPY  2017   neg bx   ERCP     EXTRACORPOREAL SHOCK WAVE LITHOTRIPSY Right 04/26/2017   Procedure: EXTRACORPOREAL SHOCK WAVE LITHOTRIPSY (ESWL);  Surgeon: Vanna Scotland, MD;  Location: [facility]S;  Service: Urology;  Laterality: Right;   GASTROSTOMY-JEJEUNOSTOMY TUBE CHANGE/PLACEMENT     KNEE ARTHROSCOPY Right    nj placement     TONSILLECTOMY     UPPER ESOPHAGEAL ENDOSCOPIC ULTRASOUND (EUS)     UPPER GASTROINTESTINAL ENDOSCOPY     Patient Active Problem List   Diagnosis Date Noted   Orthostatic syncope    Dehydration 06/09/2020   Genetic testing 10/24/2018   Idiopathic acute pancreatitis 07/22/2018   DVT (deep venous thrombosis) (HCC) 06/04/2018   Acute pancreatitis 05/27/2018   Right knee pain 05/19/2018   Peripheral edema 03/05/2018   Neuropathic pain 11/02/2017   Kidney stone 04/25/2017   Ureteral stone    Migraines 12/15/2016   Morbid obesity with BMI of 45.0-49.9, adult (HCC) 10/20/2015   Asthma, moderate 05/21/2015   Polyarthralgia 12/01/2011   Fibromyalgia  09/18/2011   Low back pain 09/18/2011   PCP: Titus Mould, NP  REFERRING PROVIDER: Virgina Organ, MD  REFERRING DIAG:  M75.02 (ICD-10-CM) - Adhesive capsulitis of left shoulder  M25.512 (ICD-10-CM) - Acute pain of left shoulder  M25.571 (ICD-10-CM) - Acute right ankle pain   THERAPY DIAG:  Chronic left shoulder pain  Shoulder joint stiffness, left  Muscle weakness (generalized)  Rationale for Evaluation and Treatment: Rehabilitation  ONSET DATE: Chronic  SUBJECTIVE:  SUBJECTIVE STATEMENT: Pt. Known well to PT clinic and has been treated for L shoulder limitations/ pain in past with benefit.  Pt. Has been participating with gym based exercise program at [employer] on a regular basis.  Pt. Reports L shoulder is "spicy" with overhead/ lifting tasks and pain persists.   Hand dominance: Left  PERTINENT HISTORY: See MD notes and extensive medical history.    PAIN:  Are you having pain? Yes: NPRS scale: 5 Pain location: L shoulder Pain description: spicy/ dull aching pain Aggravating factors: Overhead reaching Relieving factors: Rest  PRECAUTIONS: None  RED FLAGS: None   WEIGHT BEARING RESTRICTIONS: No  FALLS:  Has patient fallen in last 6 months? No  LIVING ENVIRONMENT: Lives with: lives alone Lives in: House/apartment Stairs: No Has following equipment at home: None  OCCUPATION: On disability  PLOF: Independent  PATIENT GOALS:  Increase L shoulder strength/ pain-free functional mobility.    NEXT MD VISIT: PRN  OBJECTIVE:  Note: Objective measures were completed at Evaluation unless otherwise noted.  DIAGNOSTIC FINDINGS:  No recent imaging  PATIENT SURVEYS:  LEFS 68/80 and Quick Dash 40.9%  COGNITION: Overall cognitive status: Within functional limits  for tasks assessed     SENSATION: WFL  POSTURE: Rounded shoulder/ forward head/ decreased lumbar lordosis posture  UPPER EXTREMITY ROM:   Active ROM Right eval Left eval  Shoulder flexion 169 deg. 172 deg.   Shoulder extension 48 deg. 47 deg.  Shoulder abduction 187 deg. 185 deg.  Shoulder adduction    Shoulder internal rotation 83 deg. 68 deg.  Shoulder external rotation 97 deg. 95 deg.  Elbow flexion    Elbow extension    Wrist flexion    Wrist extension    Wrist ulnar deviation    Wrist radial deviation    Wrist pronation    Wrist supination    (Blank rows = not tested)  UPPER EXTREMITY MMT:  MMT Right eval Left eval  Shoulder flexion 4 4  Shoulder extension 3+ 3+  Shoulder abduction 4 4  Shoulder adduction 4 4  Shoulder internal rotation 4 4  Shoulder external rotation 4 4  Middle trapezius 4 4  Lower trapezius 3 3  Elbow flexion 4 4  Elbow extension 4 4  Wrist flexion    Wrist extension    Wrist ulnar deviation    Wrist radial deviation    Wrist pronation    Wrist supination    Grip strength (lbs)    (Blank rows = not tested)  SHOULDER SPECIAL TESTS: Impingement tests: Neer impingement test: negative and Hawkins/Kennedy impingement test: negative SLAP lesions: NT Instability tests: NT Rotator cuff assessment: Empty can test: negative Biceps assessment: NT  JOINT MOBILITY TESTING:  R shoulder grade IV AP mob.- no pain.  R shoulder inferior mobs- 4/10 pain reported.  L shoulder grade IV AP mob.- no pain.  L shoulder inf. Glides (no pain).  Decrease L shoulder symptoms with PA mobs./ glides.    PALPATION:  L shoulder/ infraspinatus tenderness with palpation.    No assessment of ankle/LE secondary to complaints at this time.  Evaluation focus on L UE.  TREATMENT DATE: 10/27/2023  Subjective:  Pt. Has been compliant with gym  ex. At [employer] over past week and had a rest day yesterday.  Pt. Did not complete UE ex. Today to allow for new ex. At PT today.    There.ex.:  Nautilus:  20# B handles cross body/ 50# standing flys/ 50# sh. Adduction (with light PT assist)/ 50# rotation 15x.    Standing posture correction/ pec stretches with wooden dowel behind back.  Doorway pec stretches with added shoulder flexion.    See HEP    PATIENT EDUCATION: Education details: Access Code: [email or web address] Person educated: Patient Education method: Explanation and Demonstration Education comprehension: verbalized understanding and returned demonstration  HOME EXERCISE PROGRAM: Access Code: [email or web address] URL: [email or web address] Date: 10/26/2023 Prepared by: Dorene Grebe  Exercises - Lat Pull Down  - 1 x daily - 3 x weekly - 3 sets - 10 reps - Standing Cable Shoulder External Rotation   - 1 x daily - 3 x weekly - 3 sets - 10 reps - Shoulder External Rotation at 90 Degrees with Cables  - 1 x daily - 3 x weekly - 3 sets - 10 reps - Standing Cable Chops  - 1 x daily - 3 x weekly - 3 sets - 10 reps  ASSESSMENT:  CLINICAL IMPRESSION: Pt. Reports minimal L shoulder pain today at rest and ready for new UE ex. Program with gym based focus.  Pt. Presents with generalized B UE muscle weakness and pain with L shoulder IR/ end-range flexion and abduction.  Good technique with verbal cuing, esp. For posture correction.  Pt. Will benefit from skilled PT services to increase L shoulder ROM/ strength to improve pain-free functional mobility with daily tasks/ gym based ex. Program.   OBJECTIVE IMPAIRMENTS: decreased activity tolerance, decreased endurance, decreased ROM, decreased strength, hypomobility, impaired perceived functional ability, impaired flexibility, impaired UE functional use, improper body mechanics, postural dysfunction, and pain.   ACTIVITY LIMITATIONS: carrying, lifting, and reach over head  PARTICIPATION  LIMITATIONS: cleaning, laundry, community activity, and yard work  PERSONAL FACTORS: Fitness and Past/current experiences are also affecting patient's functional outcome.   REHAB POTENTIAL: Good  CLINICAL DECISION MAKING: Evolving/moderate complexity  EVALUATION COMPLEXITY: Moderate  GOALS: Goals reviewed with patient? Yes  SHORT TERM GOALS: Target date: 11/07/23  Pt. Will be independent with gym based/ HEP to increase L shoulder IR to WNL as compared to R shoulder to improve mobility.   Baseline:  see above Goal status: INITIAL  2.  Pt. Will report no tenderness over L anterior deltoid/ infraspinatus muscle to improve pain-free mobility.   Baseline: (+) tenderness Goal status: INITIAL  LONG TERM GOALS: Target date: 11/28/23  Pt. Will decrease QuickDASH to <20% to improve pain-free mobility.   Baseline:  QuickDASH: 40.9% Goal status: INITIAL  2.  Pt. Will increase B UE muscle strength 1/2 muscle grade to improve pain-free mobility.   Baseline:  see above Goal status: INITIAL  3.  Pt. Able to complete UE gym based ex. Program at [employer] with no increase c/o sh. Pain or limitations.   Baseline:  pain limited with lifting Goal status: INITIAL  PLAN:  PT FREQUENCY: 1-2x/week  PT DURATION: 6 weeks  PLANNED INTERVENTIONS: 97110-Therapeutic exercises, 97530- Therapeutic activity, 97112- Neuromuscular re-education, 97535- Self Care, 40347- Manual therapy, Patient/Family education, Dry Needling, Joint mobilization, Cryotherapy, and Moist heat  PLAN FOR NEXT SESSION: Review/progress UE gym based ex. Program for [employer].  Cammie Mcgee, PT, DPT # [phone] 10/27/2023,  8:41 AM

## 2023-10-31 ENCOUNTER — Ambulatory Visit: Payer: 59 | Admitting: Physical Therapy

## 2023-10-31 ENCOUNTER — Encounter: Payer: Self-pay | Admitting: Physical Therapy

## 2023-10-31 DIAGNOSIS — M25612 Stiffness of left shoulder, not elsewhere classified: Secondary | ICD-10-CM

## 2023-10-31 DIAGNOSIS — G8929 Other chronic pain: Secondary | ICD-10-CM

## 2023-10-31 DIAGNOSIS — M6281 Muscle weakness (generalized): Secondary | ICD-10-CM

## 2023-10-31 DIAGNOSIS — M25512 Pain in left shoulder: Secondary | ICD-10-CM | POA: Diagnosis not present

## 2023-10-31 NOTE — Therapy (Addendum)
 OUTPATIENT PHYSICAL THERAPY SHOULDER TREATMENT  Patient Name: Kelsey Bell MRN: [id number] DOB:April 28, 1981, 43 y.o., female Today's Date: 10/31/2023  END OF SESSION:  PT End of Session - 10/31/23 1123     Visit Number 3    Number of Visits 12    Date for PT Re-Evaluation 11/28/23    PT Start Time 1116    PT Stop Time 1207    PT Time Calculation (min) 51 min            Past Medical History:  Diagnosis Date   Allergy    Anxiety    Arthritis    Asthma    Breast pain present for several months   Bil LT >RT across of breasts   CRPS (complex regional pain syndrome type I)    Depression    DVT (deep venous thrombosis) (HCC) 06/2018   Fibromyalgia    Kidney stones    LGSIL on Pap smear of cervix 2007   Migraine    Neuromuscular disorder (HCC)    Complex regional pain syndrome and Fibromyalgia   Pancreatic pseudocyst    Pancreatitis    Past Surgical History:  Procedure Laterality Date   COLPOSCOPY  2017   neg bx   ERCP     EXTRACORPOREAL SHOCK WAVE LITHOTRIPSY Right 04/26/2017   Procedure: EXTRACORPOREAL SHOCK WAVE LITHOTRIPSY (ESWL);  Surgeon: Vanna Scotland, MD;  Location: [facility];  Service: Urology;  Laterality: Right;   GASTROSTOMY-JEJEUNOSTOMY TUBE CHANGE/PLACEMENT     KNEE ARTHROSCOPY Right    nj placement     TONSILLECTOMY     UPPER ESOPHAGEAL ENDOSCOPIC ULTRASOUND (EUS)     UPPER GASTROINTESTINAL ENDOSCOPY     Patient Active Problem List   Diagnosis Date Noted   Orthostatic syncope    Dehydration 06/09/2020   Genetic testing 10/24/2018   Idiopathic acute pancreatitis 07/22/2018   DVT (deep venous thrombosis) (HCC) 06/04/2018   Acute pancreatitis 05/27/2018   Right knee pain 05/19/2018   Peripheral edema 03/05/2018   Neuropathic pain 11/02/2017   Kidney stone 04/25/2017   Ureteral stone    Migraines 12/15/2016   Morbid obesity with BMI of 45.0-49.9, adult (HCC) 10/20/2015   Asthma, moderate 05/21/2015   Polyarthralgia 12/01/2011   Fibromyalgia  09/18/2011   Low back pain 09/18/2011   PCP: Emilio Aspen  REFERRING PROVIDER: Virgina Organ, MD  REFERRING DIAG:  M75.02 (ICD-10-CM) - Adhesive capsulitis of left shoulder  M25.512 (ICD-10-CM) - Acute pain of left shoulder  M25.571 (ICD-10-CM) - Acute right ankle pain   THERAPY DIAG:  Chronic left shoulder pain  Shoulder joint stiffness, left  Muscle weakness (generalized)  Rationale for Evaluation and Treatment: Rehabilitation  ONSET DATE: Chronic  SUBJECTIVE:  SUBJECTIVE STATEMENT: Pt. Known well to PT clinic and has been treated for L shoulder limitations/ pain in past with benefit.  Pt. Has been participating with gym based exercise program at [employer] on a regular basis.  Pt. Reports L shoulder is "spicy" with overhead/ lifting tasks and pain persists.   Hand dominance: Left  PERTINENT HISTORY: See MD notes and extensive medical history.    PAIN:  Are you having pain? Yes: NPRS scale: 5 Pain location: L shoulder Pain description: spicy/ dull aching pain Aggravating factors: Overhead reaching Relieving factors: Rest  PRECAUTIONS: None  RED FLAGS: None   WEIGHT BEARING RESTRICTIONS: No  FALLS:  Has patient fallen in last 6 months? No  LIVING ENVIRONMENT: Lives with: lives alone Lives in: House/apartment Stairs: No Has following equipment at home: None  OCCUPATION: On disability  PLOF: Independent  PATIENT GOALS:  Increase L shoulder strength/ pain-free functional mobility.    NEXT MD VISIT: PRN  OBJECTIVE:  Note: Objective measures were completed at Evaluation unless otherwise noted.  DIAGNOSTIC FINDINGS:  No recent imaging  PATIENT SURVEYS:  LEFS 68/80 and Quick Dash 40.9%  COGNITION: Overall cognitive status: Within functional limits for tasks  assessed     SENSATION: WFL  POSTURE: Rounded shoulder/ forward head/ decreased lumbar lordosis posture  UPPER EXTREMITY ROM:   Active ROM Right eval Left eval  Shoulder flexion 169 deg. 172 deg.   Shoulder extension 48 deg. 47 deg.  Shoulder abduction 187 deg. 185 deg.  Shoulder adduction    Shoulder internal rotation 83 deg. 68 deg.  Shoulder external rotation 97 deg. 95 deg.  Elbow flexion    Elbow extension    Wrist flexion    Wrist extension    Wrist ulnar deviation    Wrist radial deviation    Wrist pronation    Wrist supination    (Blank rows = not tested)  UPPER EXTREMITY MMT:  MMT Right eval Left eval  Shoulder flexion 4 4  Shoulder extension 3+ 3+  Shoulder abduction 4 4  Shoulder adduction 4 4  Shoulder internal rotation 4 4  Shoulder external rotation 4 4  Middle trapezius 4 4  Lower trapezius 3 3  Elbow flexion 4 4  Elbow extension 4 4  Wrist flexion    Wrist extension    Wrist ulnar deviation    Wrist radial deviation    Wrist pronation    Wrist supination    Grip strength (lbs)    (Blank rows = not tested)  SHOULDER SPECIAL TESTS: Impingement tests: Neer impingement test: negative and Hawkins/Kennedy impingement test: negative SLAP lesions: NT Instability tests: NT Rotator cuff assessment: Empty can test: negative Biceps assessment: NT  JOINT MOBILITY TESTING:  R shoulder grade IV AP mob.- no pain.  R shoulder inferior mobs- 4/10 pain reported.  L shoulder grade IV AP mob.- no pain.  L shoulder inf. Glides (no pain).  Decrease L shoulder symptoms with PA mobs./ glides.    PALPATION:  L shoulder/ infraspinatus tenderness with palpation.    No assessment of ankle/LE secondary to complaints at this time.  Evaluation focus on L UE.  TREATMENT DATE: 10/31/2023  Subjective:  Pt. Has been compliant with gym ex. At  [employer].  Pt. Reports an increase in L hand/ 3rd-5th digits tingling/ numbness.  Pt. Reports decreasing wt. At gym due to L hand numbness/ tingling.    Manual tx.:  Supine L ULTT (radial/ median/ ulnar).  Symptoms relieved with shoulder depression.  (+) ulnar nerve involvement.  Instructed in seated self nerve glides with cervical lateral flexion.    There.ex.:  Nautilus (standing):  40# (1st set), 30# (2nd set): R/L lumbar lateral flexion with added sh. IR.  30# B shoulder extension with handles (hinge at hips)- 15x3.    25# wt. unilateral carry with added lateral flexion (too heavy)- stopped and modified with use of Nautilus.  Nautilus: 60# unilateral lateral flexion 20x.  Standing 60# lawn mower pull with handles on L/R 20x.     NOT TODAY 20# B handles cross body/ 50# standing flys/ 50# sh. Adduction (with light PT assist)/ 50# rotation 15x.    Standing posture correction/ pec stretches with wooden dowel behind back.  Doorway pec stretches with added shoulder flexion.    PATIENT EDUCATION: Education details: Access Code: [email or web address] Person educated: Patient Education method: Explanation and Demonstration Education comprehension: verbalized understanding and returned demonstration  HOME EXERCISE PROGRAM: Access Code: [email or web address] URL: [email or web address] Date: 10/26/2023 Prepared by: Dorene Grebe  Exercises - Lat Pull Down  - 1 x daily - 3 x weekly - 3 sets - 10 reps - Standing Cable Shoulder External Rotation   - 1 x daily - 3 x weekly - 3 sets - 10 reps - Shoulder External Rotation at 90 Degrees with Cables  - 1 x daily - 3 x weekly - 3 sets - 10 reps - Standing Cable Chops  - 1 x daily - 3 x weekly - 3 sets - 10 reps  ASSESSMENT:  CLINICAL IMPRESSION: Pt. Worked hard during tx. Session with focus on L UE strengthening/ neural glides.  Good understanding of gym based ex.  Good technique with verbal cuing, esp. For posture correction.  No changes to HEP at  this time and pt. Will continue with gym based focus,   Pt. Will benefit from skilled PT services to increase L shoulder ROM/ strength to improve pain-free functional mobility with daily tasks/ gym based ex. Program.   OBJECTIVE IMPAIRMENTS: decreased activity tolerance, decreased endurance, decreased ROM, decreased strength, hypomobility, impaired perceived functional ability, impaired flexibility, impaired UE functional use, improper body mechanics, postural dysfunction, and pain.   ACTIVITY LIMITATIONS: carrying, lifting, and reach over head  PARTICIPATION LIMITATIONS: cleaning, laundry, community activity, and yard work  PERSONAL FACTORS: Fitness and Past/current experiences are also affecting patient's functional outcome.   REHAB POTENTIAL: Good  CLINICAL DECISION MAKING: Evolving/moderate complexity  EVALUATION COMPLEXITY: Moderate  GOALS: Goals reviewed with patient? Yes  SHORT TERM GOALS: Target date: 11/07/23  Pt. Will be independent with gym based/ HEP to increase L shoulder IR to WNL as compared to R shoulder to improve mobility.   Baseline:  see above Goal status: INITIAL  2.  Pt. Will report no tenderness over L anterior deltoid/ infraspinatus muscle to improve pain-free mobility.   Baseline: (+) tenderness Goal status: INITIAL  LONG TERM GOALS: Target date: 11/28/23  Pt. Will decrease QuickDASH to <20% to improve pain-free mobility.   Baseline:  QuickDASH: 40.9% Goal status: INITIAL  2.  Pt. Will increase B UE muscle strength 1/2 muscle grade to improve pain-free mobility.   Baseline:  see above  Goal status: INITIAL  3.  Pt. Able to complete UE gym based ex. Program at [employer] with no increase c/o sh. Pain or limitations.   Baseline:  pain limited with lifting Goal status: INITIAL  PLAN:  PT FREQUENCY: 1-2x/week  PT DURATION: 6 weeks  PLANNED INTERVENTIONS: 97110-Therapeutic exercises, 97530- Therapeutic activity, 97112- Neuromuscular re-education,  97535- Self Care, 16109- Manual therapy, Patient/Family education, Dry Needling, Joint mobilization, Cryotherapy, and Moist heat  PLAN FOR NEXT SESSION: Review/progress UE gym based ex. Program for [employer].  Cammie Mcgee, PT, DPT # [phone] 10/31/2023, 12:09 PM

## 2023-11-07 ENCOUNTER — Encounter: Payer: 59 | Admitting: Physical Therapy

## 2023-11-14 ENCOUNTER — Encounter: Payer: 59 | Admitting: Physical Therapy

## 2023-11-15 ENCOUNTER — Ambulatory Visit: Payer: 59 | Admitting: Physical Therapy

## 2023-11-21 ENCOUNTER — Ambulatory Visit: Payer: 59 | Admitting: Physical Therapy

## 2023-11-22 ENCOUNTER — Encounter: Payer: Self-pay | Admitting: Physical Therapy

## 2023-11-22 ENCOUNTER — Ambulatory Visit: Attending: Orthopedic Surgery | Admitting: Physical Therapy

## 2023-11-22 DIAGNOSIS — M6281 Muscle weakness (generalized): Secondary | ICD-10-CM | POA: Diagnosis present

## 2023-11-22 DIAGNOSIS — M25512 Pain in left shoulder: Secondary | ICD-10-CM | POA: Insufficient documentation

## 2023-11-22 DIAGNOSIS — M25612 Stiffness of left shoulder, not elsewhere classified: Secondary | ICD-10-CM | POA: Diagnosis present

## 2023-11-22 DIAGNOSIS — G8929 Other chronic pain: Secondary | ICD-10-CM | POA: Insufficient documentation

## 2023-11-22 NOTE — Therapy (Signed)
 OUTPATIENT PHYSICAL THERAPY SHOULDER TREATMENT  Patient Name: Kelsey Bell MRN: [id number] DOB:[birth year], 43 y.o., female Today's Date: 11/22/2023  END OF SESSION:  PT End of Session - 11/22/23 0821     Visit Number 4    Number of Visits 12    Date for PT Re-Evaluation 11/28/23    PT Start Time 0828    PT Stop Time 0901    PT Time Calculation (min) 33 min            Past Medical History:  Diagnosis Date   Allergy    Anxiety    Arthritis    Asthma    Breast pain present for several months   Bil LT >RT across of breasts   CRPS (complex regional pain syndrome type I)    Depression    DVT (deep venous thrombosis) (HCC) 06/2018   Fibromyalgia    Kidney stones    LGSIL on Pap smear of cervix 2007   Migraine    Neuromuscular disorder (HCC)    Complex regional pain syndrome and Fibromyalgia   Pancreatic pseudocyst    Pancreatitis    Past Surgical History:  Procedure Laterality Date   COLPOSCOPY  2017   neg bx   ERCP     EXTRACORPOREAL SHOCK WAVE LITHOTRIPSY Right 04/26/2017   Procedure: EXTRACORPOREAL SHOCK WAVE LITHOTRIPSY (ESWL);  Surgeon: Vanna Scotland, MD;  Location: [facility];  Service: Urology;  Laterality: Right;   GASTROSTOMY-JEJEUNOSTOMY TUBE CHANGE/PLACEMENT     KNEE ARTHROSCOPY Right    nj placement     TONSILLECTOMY     UPPER ESOPHAGEAL ENDOSCOPIC ULTRASOUND (EUS)     UPPER GASTROINTESTINAL ENDOSCOPY     Patient Active Problem List   Diagnosis Date Noted   Orthostatic syncope    Dehydration 06/09/2020   Genetic testing 10/24/2018   Idiopathic acute pancreatitis 07/22/2018   DVT (deep venous thrombosis) (HCC) 06/04/2018   Acute pancreatitis 05/27/2018   Right knee pain 05/19/2018   Peripheral edema 03/05/2018   Neuropathic pain 11/02/2017   Kidney stone 04/25/2017   Ureteral stone    Migraines 12/15/2016   Morbid obesity with BMI of 45.0-49.9, adult (HCC) 10/20/2015   Asthma, moderate 05/21/2015   Polyarthralgia 12/01/2011   Fibromyalgia  09/18/2011   Low back pain 09/18/2011   PCP: Emilio Aspen  REFERRING PROVIDER: Virgina Organ, MD  REFERRING DIAG:  M75.02 (ICD-10-CM) - Adhesive capsulitis of left shoulder  M25.512 (ICD-10-CM) - Acute pain of left shoulder  M25.571 (ICD-10-CM) - Acute right ankle pain   THERAPY DIAG:  Chronic left shoulder pain  Shoulder joint stiffness, left  Muscle weakness (generalized)  Rationale for Evaluation and Treatment: Rehabilitation  ONSET DATE: Chronic  SUBJECTIVE:  SUBJECTIVE STATEMENT: Pt. Known well to PT clinic and has been treated for L shoulder limitations/ pain in past with benefit.  Pt. Has been participating with gym based exercise program at [employer] on a regular basis.  Pt. Reports L shoulder is "spicy" with overhead/ lifting tasks and pain persists.   Hand dominance: Left  PERTINENT HISTORY: See MD notes and extensive medical history.    PAIN:  Are you having pain? Yes: NPRS scale: 5 Pain location: L shoulder Pain description: spicy/ dull aching pain Aggravating factors: Overhead reaching Relieving factors: Rest  PRECAUTIONS: None  RED FLAGS: None   WEIGHT BEARING RESTRICTIONS: No  FALLS:  Has patient fallen in last 6 months? No  LIVING ENVIRONMENT: Lives with: lives alone Lives in: House/apartment Stairs: No Has following equipment at home: None  OCCUPATION: On disability  PLOF: Independent  PATIENT GOALS:  Increase L shoulder strength/ pain-free functional mobility.    NEXT MD VISIT: PRN  OBJECTIVE:  Note: Objective measures were completed at Evaluation unless otherwise noted.  DIAGNOSTIC FINDINGS:  No recent imaging  PATIENT SURVEYS:  LEFS 68/80 and Quick Dash 40.9%  COGNITION: Overall cognitive status: Within functional limits for tasks  assessed     SENSATION: WFL  POSTURE: Rounded shoulder/ forward head/ decreased lumbar lordosis posture  UPPER EXTREMITY ROM:   Active ROM Right eval Left eval  Shoulder flexion 169 deg. 172 deg.   Shoulder extension 48 deg. 47 deg.  Shoulder abduction 187 deg. 185 deg.  Shoulder adduction    Shoulder internal rotation 83 deg. 68 deg.  Shoulder external rotation 97 deg. 95 deg.  Elbow flexion    Elbow extension    Wrist flexion    Wrist extension    Wrist ulnar deviation    Wrist radial deviation    Wrist pronation    Wrist supination    (Blank rows = not tested)  UPPER EXTREMITY MMT:  MMT Right eval Left eval  Shoulder flexion 4 4  Shoulder extension 3+ 3+  Shoulder abduction 4 4  Shoulder adduction 4 4  Shoulder internal rotation 4 4  Shoulder external rotation 4 4  Middle trapezius 4 4  Lower trapezius 3 3  Elbow flexion 4 4  Elbow extension 4 4  Wrist flexion    Wrist extension    Wrist ulnar deviation    Wrist radial deviation    Wrist pronation    Wrist supination    Grip strength (lbs)    (Blank rows = not tested)  SHOULDER SPECIAL TESTS: Impingement tests: Neer impingement test: negative and Hawkins/Kennedy impingement test: negative SLAP lesions: NT Instability tests: NT Rotator cuff assessment: Empty can test: negative Biceps assessment: NT  JOINT MOBILITY TESTING:  R shoulder grade IV AP mob.- no pain.  R shoulder inferior mobs- 4/10 pain reported.  L shoulder grade IV AP mob.- no pain.  L shoulder inf. Glides (no pain).  Decrease L shoulder symptoms with PA mobs./ glides.    PALPATION:  L shoulder/ infraspinatus tenderness with palpation.    No assessment of ankle/LE secondary to complaints at this time.  Evaluation focus on L UE.  TREATMENT DATE: 11/22/2023  Subjective:  Pt. Has been compliant with gym ex. At  [employer].  Pt. Completed UE ex. This morning and reports soreness in lat. Muscles/ triceps.    No there.ex. secondary to already worked out at gym in morning.    Manual tx.:  Supine L shoulder AAROM all planes with OP as tolerated.    Supine L shoulder AP/PA/inferior grade II mobs. For pain mgmt.  2x20 sec.  Good tx. Tolerance with hand placement during manual tx.  Supine STM to L UT/deltoid/ proximal biceps/ tricep/ lat. Muscles in supine position.  Generalized tenderness.  No significant trigger points noted.     NOT TODAY 20# B handles cross body/ 50# standing flys/ 50# sh. Adduction (with light PT assist)/ 50# rotation 15x.    Standing posture correction/ pec stretches with wooden dowel behind back.  Doorway pec stretches with added shoulder flexion.    PATIENT EDUCATION: Education details: Access Code: [email or web address] Person educated: Patient Education method: Explanation and Demonstration Education comprehension: verbalized understanding and returned demonstration  HOME EXERCISE PROGRAM: Access Code: [email or web address] URL: [email or web address] Date: 10/26/2023 Prepared by: Dorene Grebe  Exercises - Lat Pull Down  - 1 x daily - 3 x weekly - 3 sets - 10 reps - Standing Cable Shoulder External Rotation   - 1 x daily - 3 x weekly - 3 sets - 10 reps - Shoulder External Rotation at 90 Degrees with Cables  - 1 x daily - 3 x weekly - 3 sets - 10 reps - Standing Cable Chops  - 1 x daily - 3 x weekly - 3 sets - 10 reps  ASSESSMENT:  CLINICAL IMPRESSION: Pt. Has great understanding of gym based ex. At [employer] and will continue on a consistent basis.   No changes to HEP at this time.  PT tx. Focus on L shoulder ROM/ joint mobs and manual technique for pain mgmt.   Pt. Will benefit from skilled PT services to increase L shoulder ROM/ strength to improve pain-free functional mobility with daily tasks/ gym based ex. Program.   OBJECTIVE IMPAIRMENTS: decreased activity  tolerance, decreased endurance, decreased ROM, decreased strength, hypomobility, impaired perceived functional ability, impaired flexibility, impaired UE functional use, improper body mechanics, postural dysfunction, and pain.   ACTIVITY LIMITATIONS: carrying, lifting, and reach over head  PARTICIPATION LIMITATIONS: cleaning, laundry, community activity, and yard work  PERSONAL FACTORS: Fitness and Past/current experiences are also affecting patient's functional outcome.   REHAB POTENTIAL: Good  CLINICAL DECISION MAKING: Evolving/moderate complexity  EVALUATION COMPLEXITY: Moderate  GOALS: Goals reviewed with patient? Yes  SHORT TERM GOALS: Target date: 11/07/23  Pt. Will be independent with gym based/ HEP to increase L shoulder IR to WNL as compared to R shoulder to improve mobility.   Baseline:  see above Goal status: Partially met  2.  Pt. Will report no tenderness over L anterior deltoid/ infraspinatus muscle to improve pain-free mobility.   Baseline: (+) tenderness Goal status: Not met  LONG TERM GOALS: Target date: 11/28/23  Pt. Will decrease QuickDASH to <20% to improve pain-free mobility.   Baseline:  QuickDASH: 40.9% Goal status: INITIAL  2.  Pt. Will increase B UE muscle strength 1/2 muscle grade to improve pain-free mobility.   Baseline:  see above Goal status: INITIAL  3.  Pt. Able to complete UE gym based ex. Program at [employer] with no increase c/o sh. Pain or limitations.   Baseline:  pain limited with lifting Goal status: INITIAL  PLAN:  PT FREQUENCY: 1-2x/week  PT DURATION: 6 weeks  PLANNED INTERVENTIONS: 97110-Therapeutic exercises, 97530- Therapeutic activity, [id number]- Neuromuscular re-education, 97535- Self Care, 40981- Manual therapy, Patient/Family education, Dry Needling, Joint mobilization, Cryotherapy, and Moist heat  PLAN FOR NEXT SESSION: CHECK GOALS  Cammie Mcgee, PT, DPT # [phone] 11/22/2023, 9:39 AM

## 2023-11-28 ENCOUNTER — Encounter: Payer: Self-pay | Admitting: Physical Therapy

## 2023-11-28 ENCOUNTER — Ambulatory Visit: Payer: 59 | Admitting: Physical Therapy

## 2023-11-28 DIAGNOSIS — M25512 Pain in left shoulder: Secondary | ICD-10-CM | POA: Diagnosis not present

## 2023-11-28 DIAGNOSIS — M25612 Stiffness of left shoulder, not elsewhere classified: Secondary | ICD-10-CM

## 2023-11-28 DIAGNOSIS — G8929 Other chronic pain: Secondary | ICD-10-CM

## 2023-11-28 DIAGNOSIS — M6281 Muscle weakness (generalized): Secondary | ICD-10-CM

## 2023-11-28 NOTE — Therapy (Signed)
 OUTPATIENT PHYSICAL THERAPY SHOULDER TREATMENT  Patient Name: Kelsey Bell MRN: [id number] DOB:11/22/1980, 43 y.o., female Today's Date: 11/28/2023  END OF SESSION:  PT End of Session - 11/28/23 1100     Visit Number 5    Number of Visits 12    Date for PT Re-Evaluation 11/28/23    PT Start Time 1100    PT Stop Time 1151    PT Time Calculation (min) 51 min            Past Medical History:  Diagnosis Date   Allergy    Anxiety    Arthritis    Asthma    Breast pain present for several months   Bil LT >RT across of breasts   CRPS (complex regional pain syndrome type I)    Depression    DVT (deep venous thrombosis) (HCC) 06/2018   Fibromyalgia    Kidney stones    LGSIL on Pap smear of cervix 2007   Migraine    Neuromuscular disorder (HCC)    Complex regional pain syndrome and Fibromyalgia   Pancreatic pseudocyst    Pancreatitis    Past Surgical History:  Procedure Laterality Date   COLPOSCOPY  2017   neg bx   ERCP     EXTRACORPOREAL SHOCK WAVE LITHOTRIPSY Right 04/26/2017   Procedure: EXTRACORPOREAL SHOCK WAVE LITHOTRIPSY (ESWL);  Surgeon: Vanna Scotland, MD;  Location: [facility];  Service: Urology;  Laterality: Right;   GASTROSTOMY-JEJEUNOSTOMY TUBE CHANGE/PLACEMENT     KNEE ARTHROSCOPY Right    nj placement     TONSILLECTOMY     UPPER ESOPHAGEAL ENDOSCOPIC ULTRASOUND (EUS)     UPPER GASTROINTESTINAL ENDOSCOPY     Patient Active Problem List   Diagnosis Date Noted   Orthostatic syncope    Dehydration 06/09/2020   Genetic testing 10/24/2018   Idiopathic acute pancreatitis 07/22/2018   DVT (deep venous thrombosis) (HCC) 06/04/2018   Acute pancreatitis 05/27/2018   Right knee pain 05/19/2018   Peripheral edema 03/05/2018   Neuropathic pain 11/02/2017   Kidney stone 04/25/2017   Ureteral stone    Migraines 12/15/2016   Morbid obesity with BMI of 45.0-49.9, adult (HCC) 10/20/2015   Asthma, moderate 05/21/2015   Polyarthralgia 12/01/2011   Fibromyalgia  09/18/2011   Low back pain 09/18/2011   PCP: Emilio Aspen  REFERRING PROVIDER: Virgina Organ, MD  REFERRING DIAG:  M75.02 (ICD-10-CM) - Adhesive capsulitis of left shoulder  M25.512 (ICD-10-CM) - Acute pain of left shoulder  M25.571 (ICD-10-CM) - Acute right ankle pain   THERAPY DIAG:  Chronic left shoulder pain  Shoulder joint stiffness, left  Muscle weakness (generalized)  Rationale for Evaluation and Treatment: Rehabilitation  ONSET DATE: Chronic  SUBJECTIVE:  SUBJECTIVE STATEMENT: Pt. Known well to PT clinic and has been treated for L shoulder limitations/ pain in past with benefit.  Pt. Has been participating with gym based exercise program at [employer] on a regular basis.  Pt. Reports L shoulder is "spicy" with overhead/ lifting tasks and pain persists.   Hand dominance: Left  PERTINENT HISTORY: See MD notes and extensive medical history.    PAIN:  Are you having pain? Yes: NPRS scale: 5 Pain location: L shoulder Pain description: spicy/ dull aching pain Aggravating factors: Overhead reaching Relieving factors: Rest  PRECAUTIONS: None  RED FLAGS: None   WEIGHT BEARING RESTRICTIONS: No  FALLS:  Has patient fallen in last 6 months? No  LIVING ENVIRONMENT: Lives with: lives alone Lives in: House/apartment Stairs: No Has following equipment at home: None  OCCUPATION: On disability  PLOF: Independent  PATIENT GOALS:  Increase L shoulder strength/ pain-free functional mobility.    NEXT MD VISIT: PRN  OBJECTIVE:  Note: Objective measures were completed at Evaluation unless otherwise noted.  DIAGNOSTIC FINDINGS:  No recent imaging  PATIENT SURVEYS:  LEFS 68/80 and Quick Dash 40.9%  COGNITION: Overall cognitive status: Within functional limits for tasks  assessed     SENSATION: WFL  POSTURE: Rounded shoulder/ forward head/ decreased lumbar lordosis posture  UPPER EXTREMITY ROM:   Active ROM Right eval Left eval  Shoulder flexion 169 deg. 172 deg.   Shoulder extension 48 deg. 47 deg.  Shoulder abduction 187 deg. 185 deg.  Shoulder adduction    Shoulder internal rotation 83 deg. 68 deg.  Shoulder external rotation 97 deg. 95 deg.  Elbow flexion    Elbow extension    Wrist flexion    Wrist extension    Wrist ulnar deviation    Wrist radial deviation    Wrist pronation    Wrist supination    (Blank rows = not tested)  UPPER EXTREMITY MMT:  MMT Right eval Left eval  Shoulder flexion 4 4  Shoulder extension 3+ 3+  Shoulder abduction 4 4  Shoulder adduction 4 4  Shoulder internal rotation 4 4  Shoulder external rotation 4 4  Middle trapezius 4 4  Lower trapezius 3 3  Elbow flexion 4 4  Elbow extension 4 4  Wrist flexion    Wrist extension    Wrist ulnar deviation    Wrist radial deviation    Wrist pronation    Wrist supination    Grip strength (lbs)    (Blank rows = not tested)  SHOULDER SPECIAL TESTS: Impingement tests: Neer impingement test: negative and Hawkins/Kennedy impingement test: negative SLAP lesions: NT Instability tests: NT Rotator cuff assessment: Empty can test: negative Biceps assessment: NT  JOINT MOBILITY TESTING:  R shoulder grade IV AP mob.- no pain.  R shoulder inferior mobs- 4/10 pain reported.  L shoulder grade IV AP mob.- no pain.  L shoulder inf. Glides (no pain).  Decrease L shoulder symptoms with PA mobs./ glides.    PALPATION:  L shoulder/ infraspinatus tenderness with palpation.    No assessment of ankle/LE secondary to complaints at this time.  Evaluation focus on L UE.  TREATMENT DATE: 11/28/2023  Subjective:  Pt. Has been compliant with gym ex. At  [employer].  Pt. States tummy hurts today and was sluggish at gym this morning.    QuickDASH: 20.5% (marked improvement).    There.ex.:  Discussed [employer] ex./ cable machine ex.   Grip strength:  L: 63.2#,  R: 55.8#  Reassessment of L shoulder AROM all planes in sitting/ supine position.    Seated L shoulder MMT: flexion 4+/ abduction 4+/ extension 5/ biceps 5/ triceps 5.  Marked improvement in MMT.    Supine L shoulder serratus punches/ flexion/ horizontal abduction (full AROM).  Supine L shoulder rhythmic stabs: 30 sec. x 2 with moderate resistance.   Manual tx.:  Supine L shoulder AAROM all planes with OP as tolerated.    Supine L shoulder AP/PA/inferior grade II mobs. For pain mgmt.  2x20 sec.  Good tx. Tolerance with hand placement during manual tx.  Supine STM to L UT/deltoid/ proximal biceps/ tricep/ lat. Muscles in supine position.  Generalized tenderness.  No significant trigger points noted.     NOT TODAY 20# B handles cross body/ 50# standing flys/ 50# sh. Adduction (with light PT assist)/ 50# rotation 15x.    Standing posture correction/ pec stretches with wooden dowel behind back.  Doorway pec stretches with added shoulder flexion.    PATIENT EDUCATION: Education details: Access Code: [email or web address] Person educated: Patient Education method: Explanation and Demonstration Education comprehension: verbalized understanding and returned demonstration  HOME EXERCISE PROGRAM: Access Code: [email or web address] URL: [email or web address] Date: 10/26/2023 Prepared by: Dorene Grebe  Exercises - Lat Pull Down  - 1 x daily - 3 x weekly - 3 sets - 10 reps - Standing Cable Shoulder External Rotation   - 1 x daily - 3 x weekly - 3 sets - 10 reps - Shoulder External Rotation at 90 Degrees with Cables  - 1 x daily - 3 x weekly - 3 sets - 10 reps - Standing Cable Chops  - 1 x daily - 3 x weekly - 3 sets - 10 reps  ASSESSMENT:  CLINICAL IMPRESSION: Pt. Has great  understanding of gym based ex. At [employer] and will continue on a consistent basis.   No changes to HEP at this time.  PT tx. Focus on L shoulder ROM/ joint mobs and reassessment of MMT.   Pt. Will benefit from skilled PT services to increase L shoulder ROM/ strength to improve pain-free functional mobility with daily tasks/ gym based ex. Program.   OBJECTIVE IMPAIRMENTS: decreased activity tolerance, decreased endurance, decreased ROM, decreased strength, hypomobility, impaired perceived functional ability, impaired flexibility, impaired UE functional use, improper body mechanics, postural dysfunction, and pain.   ACTIVITY LIMITATIONS: carrying, lifting, and reach over head  PARTICIPATION LIMITATIONS: cleaning, laundry, community activity, and yard work  PERSONAL FACTORS: Fitness and Past/current experiences are also affecting patient's functional outcome.   REHAB POTENTIAL: Good  CLINICAL DECISION MAKING: Evolving/moderate complexity  EVALUATION COMPLEXITY: Moderate  GOALS: Goals reviewed with patient? Yes  SHORT TERM GOALS: Target date: 11/07/23  Pt. Will be independent with gym based/ HEP to increase L shoulder IR to WNL as compared to R shoulder to improve mobility.   Baseline:  see above Goal status: Partially met  2.  Pt. Will report no tenderness over L anterior deltoid/ infraspinatus muscle to improve pain-free mobility.   Baseline: (+) tenderness Goal status: Not met  LONG TERM GOALS: Target date: 11/28/23  Pt. Will decrease QuickDASH to <20% to improve pain-free mobility.  Baseline:  QuickDASH: 40.9% Goal status: INITIAL  2.  Pt. Will increase B UE muscle strength 1/2 muscle grade to improve pain-free mobility.   Baseline:  see above Goal status: INITIAL  3.  Pt. Able to complete UE gym based ex. Program at [employer] with no increase c/o sh. Pain or limitations.   Baseline:  pain limited with lifting Goal status: INITIAL  PLAN:  PT FREQUENCY:  1-2x/week  PT DURATION: 6 weeks  PLANNED INTERVENTIONS: 97110-Therapeutic exercises, 97530- Therapeutic activity, 97112- Neuromuscular re-education, 97535- Self Care, 14782- Manual therapy, Patient/Family education, Dry Needling, Joint mobilization, Cryotherapy, and Moist heat  PLAN FOR NEXT SESSION: CHECK GOALS/ RECERT vs. DISCHARGE next tx.   Cammie Mcgee, PT, DPT # [phone] 11/28/2023, 12:52 PM

## 2023-12-05 ENCOUNTER — Ambulatory Visit: Payer: 59 | Attending: Orthopedic Surgery | Admitting: Physical Therapy

## 2023-12-05 ENCOUNTER — Encounter: Payer: Self-pay | Admitting: Physical Therapy

## 2023-12-05 DIAGNOSIS — M6281 Muscle weakness (generalized): Secondary | ICD-10-CM | POA: Diagnosis present

## 2023-12-05 DIAGNOSIS — G8929 Other chronic pain: Secondary | ICD-10-CM | POA: Diagnosis present

## 2023-12-05 DIAGNOSIS — M25612 Stiffness of left shoulder, not elsewhere classified: Secondary | ICD-10-CM

## 2023-12-05 DIAGNOSIS — M25512 Pain in left shoulder: Secondary | ICD-10-CM | POA: Diagnosis present

## 2023-12-05 NOTE — Therapy (Signed)
 OUTPATIENT PHYSICAL THERAPY SHOULDER TREATMENT/ RECERTIFICATION  Patient Name: Kelsey Bell MRN: [id number] DOB:Mar 01, 1981, 43 y.o., female Today's Date: 12/05/2023  END OF SESSION:  PT End of Session - 12/05/23 0734     Visit Number 6    Number of Visits 10    Date for PT Re-Evaluation 01/02/24    PT Start Time 0734    PT Stop Time 0817    PT Time Calculation (min) 43 min            Past Medical History:  Diagnosis Date   Allergy    Anxiety    Arthritis    Asthma    Breast pain present for several months   Bil LT >RT across of breasts   CRPS (complex regional pain syndrome type I)    Depression    DVT (deep venous thrombosis) (HCC) 06/2018   Fibromyalgia    Kidney stones    LGSIL on Pap smear of cervix 2007   Migraine    Neuromuscular disorder (HCC)    Complex regional pain syndrome and Fibromyalgia   Pancreatic pseudocyst    Pancreatitis    Past Surgical History:  Procedure Laterality Date   COLPOSCOPY  2017   neg bx   ERCP     EXTRACORPOREAL SHOCK WAVE LITHOTRIPSY Right 04/26/2017   Procedure: EXTRACORPOREAL SHOCK WAVE LITHOTRIPSY (ESWL);  Surgeon: Vanna Scotland, MD;  Location: [facility] ORS;  Service: Urology;  Laterality: Right;   GASTROSTOMY-JEJEUNOSTOMY TUBE CHANGE/PLACEMENT     KNEE ARTHROSCOPY Right    nj placement     TONSILLECTOMY     UPPER ESOPHAGEAL ENDOSCOPIC ULTRASOUND (EUS)     UPPER GASTROINTESTINAL ENDOSCOPY     Patient Active Problem List   Diagnosis Date Noted   Orthostatic syncope    Dehydration 06/09/2020   Genetic testing 10/24/2018   Idiopathic acute pancreatitis 07/22/2018   DVT (deep venous thrombosis) (HCC) 06/04/2018   Acute pancreatitis 05/27/2018   Right knee pain 05/19/2018   Peripheral edema 03/05/2018   Neuropathic pain 11/02/2017   Kidney stone 04/25/2017   Ureteral stone    Migraines 12/15/2016   Morbid obesity with BMI of 45.0-49.9, adult (HCC) 10/20/2015   Asthma, moderate 05/21/2015   Polyarthralgia 12/01/2011    Fibromyalgia 09/18/2011   Low back pain 09/18/2011   PCP: Emilio Aspen  REFERRING PROVIDER: Virgina Organ, MD  REFERRING DIAG:  M75.02 (ICD-10-CM) - Adhesive capsulitis of left shoulder  M25.512 (ICD-10-CM) - Acute pain of left shoulder  M25.571 (ICD-10-CM) - Acute right ankle pain   THERAPY DIAG:  Chronic left shoulder pain  Shoulder joint stiffness, left  Muscle weakness (generalized)  Rationale for Evaluation and Treatment: Rehabilitation  ONSET DATE: Chronic  SUBJECTIVE:  SUBJECTIVE STATEMENT: Pt. Known well to PT clinic and has been treated for L shoulder limitations/ pain in past with benefit.  Pt. Has been participating with gym based exercise program at [employer] on a regular basis.  Pt. Reports L shoulder is "spicy" with overhead/ lifting tasks and pain persists.   Hand dominance: Left  PERTINENT HISTORY: See MD notes and extensive medical history.    PAIN:  Are you having pain? Yes: NPRS scale: 5 Pain location: L shoulder Pain description: spicy/ dull aching pain Aggravating factors: Overhead reaching Relieving factors: Rest  PRECAUTIONS: None  RED FLAGS: None   WEIGHT BEARING RESTRICTIONS: No  FALLS:  Has patient fallen in last 6 months? No  LIVING ENVIRONMENT: Lives with: lives alone Lives in: House/apartment Stairs: No Has following equipment at home: None  OCCUPATION: On disability  PLOF: Independent  PATIENT GOALS:  Increase L shoulder strength/ pain-free functional mobility.    NEXT MD VISIT: PRN  OBJECTIVE:  Note: Objective measures were completed at Evaluation unless otherwise noted.  DIAGNOSTIC FINDINGS:  No recent imaging  PATIENT SURVEYS:  LEFS 68/80 and Quick Dash 40.9%  COGNITION: Overall cognitive status: Within functional limits  for tasks assessed     SENSATION: WFL  POSTURE: Rounded shoulder/ forward head/ decreased lumbar lordosis posture  UPPER EXTREMITY ROM:   Active ROM Right eval Left eval  Shoulder flexion 169 deg. 172 deg.   Shoulder extension 48 deg. 47 deg.  Shoulder abduction 187 deg. 185 deg.  Shoulder adduction    Shoulder internal rotation 83 deg. 68 deg.  Shoulder external rotation 97 deg. 95 deg.  Elbow flexion    Elbow extension    Wrist flexion    Wrist extension    Wrist ulnar deviation    Wrist radial deviation    Wrist pronation    Wrist supination    (Blank rows = not tested)  UPPER EXTREMITY MMT:  MMT Right eval Left eval  Shoulder flexion 4 4  Shoulder extension 3+ 3+  Shoulder abduction 4 4  Shoulder adduction 4 4  Shoulder internal rotation 4 4  Shoulder external rotation 4 4  Middle trapezius 4 4  Lower trapezius 3 3  Elbow flexion 4 4  Elbow extension 4 4  Wrist flexion    Wrist extension    Wrist ulnar deviation    Wrist radial deviation    Wrist pronation    Wrist supination    Grip strength (lbs)    (Blank rows = not tested)  SHOULDER SPECIAL TESTS: Impingement tests: Neer impingement test: negative and Hawkins/Kennedy impingement test: negative SLAP lesions: NT Instability tests: NT Rotator cuff assessment: Empty can test: negative Biceps assessment: NT  JOINT MOBILITY TESTING:  R shoulder grade IV AP mob.- no pain.  R shoulder inferior mobs- 4/10 pain reported.  L shoulder grade IV AP mob.- no pain.  L shoulder inf. Glides (no pain).  Decrease L shoulder symptoms with PA mobs./ glides.    PALPATION:  L shoulder/ infraspinatus tenderness with palpation.    No assessment of ankle/LE secondary to complaints at this time.  Evaluation focus on L UE.    QuickDASH: 20.5% (marked improvement).   Grip strength:  L: 63.2#,  R: 55.8#  Seated L shoulder MMT: flexion 4+/ abduction 4+/ extension 5/ biceps 5/ triceps 5.  Marked improvement in MMT.  TREATMENT DATE: 12/05/2023  Subjective:  Pt. Has been compliant with gym ex. At [employer].  Pt. Had a comprehensive work out this morning with UE.  Pt. Reports B forearm/ hand "deep numbness" and symptoms "feel like a TENS unit".  Pt. Saw Chiropractor last Friday and this Monday (Acupuncture).    Manual tx.:  Reassessment of shoulder AROM (all planes).    Supine L shoulder AAROM all planes with OP as tolerated.    Supine L shoulder AP/PA/inferior grade II mobs. For pain mgmt.  2x20 sec.  Good tx. Tolerance with hand placement during manual tx.  Supine STM to L UT/deltoid/ proximal biceps/ tricep/ lat. Muscles in supine position.  Generalized tenderness.  L UT trigger point noted today.  TDN to L UT in prone with 50 mm monofilament (2) with pistoning technique.  Muscle fasciculation noted followed by STM with use of Biofreeze.   NOT TODAY 20# B handles cross body/ 50# standing flys/ 50# sh. Adduction (with light PT assist)/ 50# rotation 15x.    Standing posture correction/ pec stretches with wooden dowel behind back.  Doorway pec stretches with added shoulder flexion.    PATIENT EDUCATION: Education details: Access Code: [email or web address] Person educated: Patient Education method: Explanation and Demonstration Education comprehension: verbalized understanding and returned demonstration  HOME EXERCISE PROGRAM: Access Code: [email or web address] URL: [email or web address] Date: 10/26/2023 Prepared by: Dorene Grebe  Exercises - Lat Pull Down  - 1 x daily - 3 x weekly - 3 sets - 10 reps - Standing Cable Shoulder External Rotation   - 1 x daily - 3 x weekly - 3 sets - 10 reps - Shoulder External Rotation at 90 Degrees with Cables  - 1 x daily - 3 x weekly - 3 sets - 10 reps - Standing Cable Chops  - 1 x daily - 3 x weekly - 3 sets - 10 reps  ASSESSMENT:  CLINICAL  IMPRESSION: Pt. Has great understanding of gym based ex. At [employer] and will continue on a consistent basis.   No changes to HEP at this time.  PT tx. Focus on L shoulder ROM/ joint mobs and manual tx.  Good tx. Tolerance with TDN to L UT trigger point with STM (marked improvement).  Pt. Will benefit from skilled PT services to increase L shoulder ROM/ strength to improve pain-free functional mobility with daily tasks/ gym based ex. Program.   OBJECTIVE IMPAIRMENTS: decreased activity tolerance, decreased endurance, decreased ROM, decreased strength, hypomobility, impaired perceived functional ability, impaired flexibility, impaired UE functional use, improper body mechanics, postural dysfunction, and pain.   ACTIVITY LIMITATIONS: carrying, lifting, and reach over head  PARTICIPATION LIMITATIONS: cleaning, laundry, community activity, and yard work  PERSONAL FACTORS: Fitness and Past/current experiences are also affecting patient's functional outcome.   REHAB POTENTIAL: Good  CLINICAL DECISION MAKING: Evolving/moderate complexity  EVALUATION COMPLEXITY: Moderate  GOALS: Goals reviewed with patient? Yes  SHORT TERM GOALS: Target date: 11/07/23  Pt. Will be independent with gym based/ HEP to increase L shoulder IR to WNL as compared to R shoulder to improve mobility.   Baseline:  see above Goal status: Partially met  2.  Pt. Will report no tenderness over L anterior deltoid/ infraspinatus muscle to improve pain-free mobility.   Baseline: (+) tenderness Goal status: Not met  LONG TERM GOALS: Target date: 11/28/23  Pt. Will decrease QuickDASH to <20% to improve pain-free mobility.   Baseline:  QuickDASH: 40.9%.  4/2: 20.5% Goal status:  Goal met  2.  Pt. Will  increase B UE muscle strength 1/2 muscle grade to improve pain-free mobility.   Baseline:  see above Goal status: Partially met  3.  Pt. Able to complete UE gym based ex. Program at [employer] with no increase c/o sh.  Pain or limitations.   Baseline:  pain limited with lifting Goal status: Partially met  PLAN:  PT FREQUENCY: 1-2x/week  PT DURATION: 4 weeks  PLANNED INTERVENTIONS: 97110-Therapeutic exercises, 97530- Therapeutic activity, 97112- Neuromuscular re-education, 97535- Self Care, 82956- Manual therapy, Patient/Family education, Dry Needling, Joint mobilization, Cryotherapy, and Moist heat  PLAN FOR NEXT SESSION:  Reassess L deltoid/ AC joint tenderness  Cammie Mcgee, PT, DPT # [phone] 12/05/2023, 10:32 AM

## 2023-12-12 ENCOUNTER — Ambulatory Visit: Payer: 59 | Admitting: Physical Therapy

## 2023-12-19 ENCOUNTER — Ambulatory Visit: Payer: 59 | Admitting: Physical Therapy

## 2024-01-07 ENCOUNTER — Ambulatory Visit: Admitting: Physical Therapy

## 2024-01-09 ENCOUNTER — Ambulatory Visit: Admitting: Physical Therapy

## 2024-01-29 ENCOUNTER — Ambulatory Visit: Attending: Orthopedic Surgery | Admitting: Physical Therapy

## 2024-01-29 ENCOUNTER — Encounter: Payer: Self-pay | Admitting: Physical Therapy

## 2024-01-29 DIAGNOSIS — M6281 Muscle weakness (generalized): Secondary | ICD-10-CM | POA: Diagnosis present

## 2024-01-29 DIAGNOSIS — M25512 Pain in left shoulder: Secondary | ICD-10-CM | POA: Diagnosis present

## 2024-01-29 DIAGNOSIS — M25612 Stiffness of left shoulder, not elsewhere classified: Secondary | ICD-10-CM | POA: Insufficient documentation

## 2024-01-29 DIAGNOSIS — G8929 Other chronic pain: Secondary | ICD-10-CM | POA: Diagnosis present

## 2024-01-29 NOTE — Therapy (Signed)
 OUTPATIENT PHYSICAL THERAPY SHOULDER TREATMENT/ RECERTIFICATION  Patient Name: Kelsey Bell MRN: [id number] DOB:[birth year], 43 y.o., female Today's Date: 01/29/2024  END OF SESSION:  PT End of Session - 01/29/24 0900     Visit Number 7    Number of Visits 15    Date for PT Re-Evaluation 03/25/24    PT Start Time 0900    PT Stop Time 0949    PT Time Calculation (min) 49 min            Past Medical History:  Diagnosis Date   Allergy    Anxiety    Arthritis    Asthma    Breast pain present for several months   Bil LT >RT across of breasts   CRPS (complex regional pain syndrome type I)    Depression    DVT (deep venous thrombosis) (HCC) 06/2018   Fibromyalgia    Kidney stones    LGSIL on Pap smear of cervix 2007   Migraine    Neuromuscular disorder (HCC)    Complex regional pain syndrome and Fibromyalgia   Pancreatic pseudocyst    Pancreatitis    Past Surgical History:  Procedure Laterality Date   COLPOSCOPY  2017   neg bx   ERCP     EXTRACORPOREAL SHOCK WAVE LITHOTRIPSY Right 04/26/2017   Procedure: EXTRACORPOREAL SHOCK WAVE LITHOTRIPSY (ESWL);  Surgeon: Dustin Gimenez, MD;  Location: [facility];  Service: Urology;  Laterality: Right;   GASTROSTOMY-JEJEUNOSTOMY TUBE CHANGE/PLACEMENT     KNEE ARTHROSCOPY Right    nj placement     TONSILLECTOMY     UPPER ESOPHAGEAL ENDOSCOPIC ULTRASOUND (EUS)     UPPER GASTROINTESTINAL ENDOSCOPY     Patient Active Problem List   Diagnosis Date Noted   Orthostatic syncope    Dehydration 06/09/2020   Genetic testing 10/24/2018   Idiopathic acute pancreatitis 07/22/2018   DVT (deep venous thrombosis) (HCC) 06/04/2018   Acute pancreatitis 05/27/2018   Right knee pain 05/19/2018   Peripheral edema 03/05/2018   Neuropathic pain 11/02/2017   Kidney stone 04/25/2017   Ureteral stone    Migraines 12/15/2016   Morbid obesity with BMI of 45.0-49.9, adult (HCC) 10/20/2015   Asthma, moderate 05/21/2015   Polyarthralgia 12/01/2011    Fibromyalgia 09/18/2011   Low back pain 09/18/2011   PCP: Julius Ohs  REFERRING PROVIDER: Aloha Jakes, MD  REFERRING DIAG:  M75.02 (ICD-10-CM) - Adhesive capsulitis of left shoulder  M25.512 (ICD-10-CM) - Acute pain of left shoulder  M25.571 (ICD-10-CM) - Acute right ankle pain   THERAPY DIAG:  Chronic left shoulder pain  Shoulder joint stiffness, left  Muscle weakness (generalized)  Rationale for Evaluation and Treatment: Rehabilitation  ONSET DATE: Chronic  SUBJECTIVE:  SUBJECTIVE STATEMENT: Pt. Known well to PT clinic and has been treated for L shoulder limitations/ pain in past with benefit.  Pt. Has been participating with gym based exercise program at [employer] on a regular basis.  Pt. Reports L shoulder is "spicy" with overhead/ lifting tasks and pain persists.   Hand dominance: Left  PERTINENT HISTORY: See MD notes and extensive medical history.    PAIN:  Are you having pain? Yes: NPRS scale: 5 Pain location: L shoulder Pain description: spicy/ dull aching pain Aggravating factors: Overhead reaching Relieving factors: Rest  PRECAUTIONS: None  RED FLAGS: None   WEIGHT BEARING RESTRICTIONS: No  FALLS:  Has patient fallen in last 6 months? No  LIVING ENVIRONMENT: Lives with: lives alone Lives in: House/apartment Stairs: No Has following equipment at home: None  OCCUPATION: On disability  PLOF: Independent  PATIENT GOALS:  Increase L shoulder strength/ pain-free functional mobility.    NEXT MD VISIT: PRN  OBJECTIVE:  Note: Objective measures were completed at Evaluation unless otherwise noted.  DIAGNOSTIC FINDINGS:  No recent imaging  PATIENT SURVEYS:  LEFS 68/80 and Quick Dash 40.9%  COGNITION: Overall cognitive status: Within functional limits  for tasks assessed     SENSATION: WFL  POSTURE: Rounded shoulder/ forward head/ decreased lumbar lordosis posture  UPPER EXTREMITY ROM:   Active ROM Right eval Left eval  Shoulder flexion 169 deg. 172 deg.   Shoulder extension 48 deg. 47 deg.  Shoulder abduction 187 deg. 185 deg.  Shoulder adduction    Shoulder internal rotation 83 deg. 68 deg.  Shoulder external rotation 97 deg. 95 deg.  Elbow flexion    Elbow extension    Wrist flexion    Wrist extension    Wrist ulnar deviation    Wrist radial deviation    Wrist pronation    Wrist supination    (Blank rows = not tested)  UPPER EXTREMITY MMT:  MMT Right eval Left eval  Shoulder flexion 4 4  Shoulder extension 3+ 3+  Shoulder abduction 4 4  Shoulder adduction 4 4  Shoulder internal rotation 4 4  Shoulder external rotation 4 4  Middle trapezius 4 4  Lower trapezius 3 3  Elbow flexion 4 4  Elbow extension 4 4  Wrist flexion    Wrist extension    Wrist ulnar deviation    Wrist radial deviation    Wrist pronation    Wrist supination    Grip strength (lbs)    (Blank rows = not tested)  SHOULDER SPECIAL TESTS: Impingement tests: Neer impingement test: negative and Hawkins/Kennedy impingement test: negative SLAP lesions: NT Instability tests: NT Rotator cuff assessment: Empty can test: negative Biceps assessment: NT  JOINT MOBILITY TESTING:  R shoulder grade IV AP mob.- no pain.  R shoulder inferior mobs- 4/10 pain reported.  L shoulder grade IV AP mob.- no pain.  L shoulder inf. Glides (no pain).  Decrease L shoulder symptoms with PA mobs./ glides.    PALPATION:  L shoulder/ infraspinatus tenderness with palpation.    No assessment of ankle/LE secondary to complaints at this time.  Evaluation focus on L UE.    QuickDASH: 20.5% (marked improvement).   Grip strength:  L: 63.2#,  R: 55.8#  Seated L shoulder MMT: flexion 4+/ abduction 4+/ extension 5/ biceps 5/ triceps 5.  Marked improvement in MMT.  TREATMENT DATE: 01/29/2024  Subjective:  Pt. Has not been going to gym since surgery date (12/20/23).  Pt. Has been caring for 48 month old occasionally and limited with picking her up on L side.  Pt. Benefits from picking her up with B UE and carrying on R side.    Manual tx.:  Reassessment of shoulder AROM (all planes).    Supine L shoulder AAROM all planes with OP as tolerated.  Static holds at tolerable end-range.    Supine L shoulder AP/PA/inferior grade II mobs. For pain mgmt.  2x20 sec.  Good tx. Tolerance with hand placement during manual tx.  Supine STM to L UT/deltoid/ proximal biceps/ tricep/ lat. Muscles in supine position.  Generalized tenderness.     There.ex.:  Supine 3# L shoulder serratus punches/ tricep extension/ shoulder flexion/ horizontal abduction/ adduction/ bicep curls 20x each.    Supine B shoulder shoulder horizontal abduction/ adduction with latex free RTB 20x.  Diagonals 10x2 on L/R.    Seated RTB scap. Retraction 20x with upright posture.  Pt. Planning to return to gym tomorrow.     NOT TODAY 20# B handles cross body/ 50# standing flys/ 50# sh. Adduction (with light PT assist)/ 50# rotation 15x.    Standing posture correction/ pec stretches with wooden dowel behind back.  Doorway pec stretches with added shoulder flexion.    L UT trigger point noted today.  TDN to L UT in prone with 50 mm monofilament (2) with pistoning technique.  Muscle fasciculation noted followed by STM with use of Biofreeze.    PATIENT EDUCATION: Education details: Access Code: [email or web address] Person educated: Patient Education method: Explanation and Demonstration Education comprehension: verbalized understanding and returned demonstration  HOME EXERCISE PROGRAM: Access Code: [email or web address] URL: [email or web address] Date: 10/26/2023 Prepared by: Hazeline Lister  Exercises - Lat Pull Down  - 1 x daily - 3 x weekly - 3 sets - 10 reps - Standing Cable Shoulder External Rotation   - 1 x daily - 3 x weekly - 3 sets - 10 reps - Shoulder External Rotation at 90 Degrees with Cables  - 1 x daily - 3 x weekly - 3 sets - 10 reps - Standing Cable Chops  - 1 x daily - 3 x weekly - 3 sets - 10 reps  ASSESSMENT:  CLINICAL IMPRESSION: Pt. Has great understanding of gym based ex. At [employer] and will continue on a consistent basis.   No changes to HEP at this time.  PT tx. Focus on L shoulder ROM/ joint mobs and manual tx.  Good tx. Tolerance with TDN to L UT trigger point with STM (marked improvement).  Pt. Will benefit from skilled PT services to increase L shoulder ROM/ strength to improve pain-free functional mobility with daily tasks/ gym based ex. Program.   OBJECTIVE IMPAIRMENTS: decreased activity tolerance, decreased endurance, decreased ROM, decreased strength, hypomobility, impaired perceived functional ability, impaired flexibility, impaired UE functional use, improper body mechanics, postural dysfunction, and pain.   ACTIVITY LIMITATIONS: carrying, lifting, and reach over head  PARTICIPATION LIMITATIONS: cleaning, laundry, community activity, and yard work  PERSONAL FACTORS: Fitness and Past/current experiences are also affecting patient's functional outcome.   REHAB POTENTIAL: Good  CLINICAL DECISION MAKING: Evolving/moderate complexity  EVALUATION COMPLEXITY: Moderate  GOALS: Goals reviewed with patient? Yes  SHORT TERM GOALS: Target date: 11/07/23  Pt. Will be independent with gym based/ HEP to increase L shoulder IR to WNL as compared to R shoulder to improve mobility.   Baseline:  see above Goal status: Partially met  2.  Pt. Will report no tenderness over L anterior deltoid/ infraspinatus muscle to improve pain-free mobility.   Baseline: (+) tenderness Goal status: Not met  LONG TERM GOALS: Target date: 11/28/23  Pt.  Will decrease QuickDASH to <20% to improve pain-free mobility.   Baseline:  QuickDASH: 40.9%.  4/2: 20.5% Goal status:  Goal met  2.  Pt. Will increase B UE muscle strength 1/2 muscle grade to improve pain-free mobility.   Baseline:  see above Goal status: Partially met  3.  Pt. Able to complete UE gym based ex. Program at [employer] with no increase c/o sh. Pain or limitations.   Baseline:  pain limited with lifting Goal status: Partially met  PLAN:  PT FREQUENCY: 1-2x/week  PT DURATION: 4 weeks  PLANNED INTERVENTIONS: 97110-Therapeutic exercises, 97530- Therapeutic activity, 97112- Neuromuscular re-education, 97535- Self Care, 16109- Manual therapy, Patient/Family education, Dry Needling, Joint mobilization, Cryotherapy, and Moist heat  PLAN FOR NEXT SESSION:  Reassess L deltoid/ AC joint tenderness  Lendell Quarry, PT, DPT # [phone] 01/29/2024, 10:08 AM

## 2024-06-03 ENCOUNTER — Ambulatory Visit: Admitting: Physical Therapy

## 2024-06-10 ENCOUNTER — Ambulatory Visit: Admitting: Physical Therapy

## 2024-06-17 ENCOUNTER — Ambulatory Visit: Admitting: Physical Therapy

## 2024-06-24 ENCOUNTER — Ambulatory Visit: Admitting: Physical Therapy

## 2024-07-01 ENCOUNTER — Ambulatory Visit: Admitting: Physical Therapy

## 2024-07-02 ENCOUNTER — Ambulatory Visit: Attending: Nurse Practitioner | Admitting: Physical Therapy

## 2024-07-02 ENCOUNTER — Encounter: Payer: Self-pay | Admitting: Physical Therapy

## 2024-07-02 DIAGNOSIS — M6281 Muscle weakness (generalized): Secondary | ICD-10-CM | POA: Insufficient documentation

## 2024-07-02 DIAGNOSIS — M542 Cervicalgia: Secondary | ICD-10-CM | POA: Diagnosis present

## 2024-07-02 DIAGNOSIS — M792 Neuralgia and neuritis, unspecified: Secondary | ICD-10-CM | POA: Insufficient documentation

## 2024-07-02 NOTE — Therapy (Unsigned)
 OUTPATIENT PHYSICAL THERAPY CERVICAL EVALUATION  Patient Name: Kelsey Bell MRN: [id number] DOB:July 24, 1981, 43 y.o., female Today's Date: 07/02/2024  END OF SESSION:  PT End of Session - 07/02/24 1421     Visit Number 1    Number of Visits 12    Date for Recertification  08/13/24    PT Start Time 0945    PT Stop Time 1030    PT Time Calculation (min) 45 min    Activity Tolerance Patient tolerated treatment well;Patient limited by fatigue;Patient limited by pain    Behavior During Therapy [facility] for tasks assessed/performed          Past Medical History:  Diagnosis Date   Allergy    Anxiety    Arthritis    Asthma    Breast pain present for several months   Bil LT >RT across of breasts   CRPS (complex regional pain syndrome type I)    Depression    DVT (deep venous thrombosis) (HCC) 06/2018   Fibromyalgia    Kidney stones    LGSIL on Pap smear of cervix 2007   Migraine    Neuromuscular disorder (HCC)    Complex regional pain syndrome and Fibromyalgia   Pancreatic pseudocyst    Pancreatitis    Past Surgical History:  Procedure Laterality Date   COLPOSCOPY  2017   neg bx   ERCP     EXTRACORPOREAL SHOCK WAVE LITHOTRIPSY Right 04/26/2017   Procedure: EXTRACORPOREAL SHOCK WAVE LITHOTRIPSY (ESWL);  Surgeon: Penne Knee, MD;  Location: [facility] ORS;  Service: Urology;  Laterality: Right;   GASTROSTOMY-JEJEUNOSTOMY TUBE CHANGE/PLACEMENT     KNEE ARTHROSCOPY Right    nj placement     TONSILLECTOMY     UPPER ESOPHAGEAL ENDOSCOPIC ULTRASOUND (EUS)     UPPER GASTROINTESTINAL ENDOSCOPY     Patient Active Problem List   Diagnosis Date Noted   Orthostatic syncope    Dehydration 06/09/2020   Genetic testing 10/24/2018   Idiopathic acute pancreatitis 07/22/2018   DVT (deep venous thrombosis) (HCC) 06/04/2018   Acute pancreatitis 05/27/2018   Right knee pain 05/19/2018   Peripheral edema 03/05/2018   Neuropathic pain 11/02/2017   Kidney stone 04/25/2017   Ureteral stone     Migraines 12/15/2016   Morbid obesity with BMI of 45.0-49.9, adult (HCC) 10/20/2015   Asthma, moderate 05/21/2015   Polyarthralgia 12/01/2011   Fibromyalgia 09/18/2011   Low back pain 09/18/2011    PCP: Teresa Almarie Nam, NP  REFERRING PROVIDER: Shawnie Rickard LABOR, NP  REFERRING DIAG:  M25.50 (ICD-10-CM) - Pain in unspecified joint  M54.2 (ICD-10-CM) - Cervicalgia    THERAPY DIAG:  Muscle weakness (generalized)  Cervical pain (neck)  Radicular pain of left upper extremity  Radicular pain of right upper extremity  Rationale for Evaluation and Treatment: Rehabilitation  ONSET DATE: Chronic for at least > 10 years  SUBJECTIVE:  SUBJECTIVE STATEMENT: Pt is a 43 year old female who presents to physical therapy with chronic, 5/10 dull, stiff cervical neck pain which can reach a 10/10 at its worst. Pain can also cause headaches or patients TMJ to flair up with headaches being much more commonly occurring recently. Pt also states that she experiences bilateral upper trap tightness/pain with R>L and distal numbness and tingling sensations into bilateral extremities with L>R. Patient would like to decrease stiffness in neck region so that she may complete household tasks and lifting heavier objects such as her dog's food bags without significant pain.  Hand dominance: Left  PERTINENT HISTORY:  Patient has previously attempted to resolve chronic neck pain with physical therapy, dry needling, acupuncture, chiropractor, and use of Tylenol  and muscle relaxers. Pt has no previous surgical history in cervical spine.   PAIN:  Are you having pain? Yes: NPRS scale: 5/10 at rest in cervical spine, 10/10 at it's worst Pain location: Cervical spine, bilateral upper traps region (R>L), distal  symptoms into bilateral UEs (L>R) Pain description: Stiff, achy, constant Aggravating factors: stress, direct pressure to the area such as laying down on her back Relieving factors: muscle relaxers, rest, Biofreeze, Tylenol   PRECAUTIONS: None  RED FLAGS: Cervical pain that wakes patient up in middle of the night   WEIGHT BEARING RESTRICTIONS: No  FALLS:  Has patient fallen in last 6 months? No  LIVING ENVIRONMENT: Lives with: lives alone Lives in: House/apartment Stairs: No Has following equipment at home: None  OCCUPATION: Patient currently on disability  PLOF: Independent  PATIENT GOALS: Patient would like to decrease stiffness in neck region and improve overall UE strength so that she may complete household tasks and lifting heavier objects such as her dog's food bags without significant pain.  NEXT MD VISIT: None currently scheduled with referring provider  OBJECTIVE:  Note: Objective measures were completed at Evaluation unless otherwise noted.  DIAGNOSTIC FINDINGS:  N/A  PATIENT SURVEYS:  NDI: 32% self-perceived moderate disability  COGNITION: Overall cognitive status: Within functional limits for tasks assessed  SENSATION: Light touch: WFL  POSTURE: rounded shoulders, forward head, and increased thoracic kyphosis  PALPATION: Tender and mild pain upon palpation to suboccipital musculature and bilateral upper traps regions (L>R)   CERVICAL ROM:   Active ROM AROM (deg) eval  Flexion 44 deg  Extension 22 deg  Right lateral flexion 35 deg  Left lateral flexion 40 deg  Right rotation WFL  Left rotation WFL   (Blank rows = not tested)  UPPER EXTREMITY ROM:  Active ROM Right eval Left eval  Shoulder flexion WNL  120 deg  Shoulder extension    Shoulder abduction WNL 115 deg  Shoulder adduction    Shoulder extension    Shoulder internal rotation [facility] WFL  Shoulder external rotation [facility]  Elbow flexion    Elbow extension    Wrist flexion     Wrist extension    Wrist ulnar deviation    Wrist radial deviation    Wrist pronation    Wrist supination     (Blank rows = not tested)  UPPER EXTREMITY MMT:  MMT Right eval Left eval  Shoulder flexion 4+ 3+  Shoulder extension    Shoulder abduction 4 3+  Shoulder adduction    Shoulder extension    Shoulder internal rotation 4+ 4  Shoulder external rotation 4+ 4  Middle trapezius    Lower trapezius    Elbow flexion 5 5  Elbow extension 4+ 4+  Wrist flexion  5 5  Wrist extension    Wrist ulnar deviation    Wrist radial deviation    Wrist pronation    Wrist supination    Grip strength 52# 62.7#   (Blank rows = not tested)  CERVICAL SPECIAL TESTS:  Upper limb tension test (ULTT): Positive on LUE for concordant distal symptoms into elbow (median nerve)  TREATMENT DATE: 07/02/2024                                                                                                                                Therapeutic Exercises: Seated shoulder retractions, 2 x 12 Seated cervical retractions, 2 x 10 Seated Upper Trap stretch, 2 x 20 seconds bilaterally    PATIENT EDUCATION:  Education details: Pt educated on POC, HEP, diagnosis, prognosis, and anatomy involved Person educated: Patient Education method: Explanation, Demonstration, Tactile cues, Verbal cues, and Handouts Education comprehension: verbalized understanding and returned demonstration  HOME EXERCISE PROGRAM: Access Code: [email or web address] URL: [email or web address] Date: 07/02/2024 Prepared by: Ozell Sero Exercises - Seated Upper Trapezius Stretch - 1 x daily - 4-5 x weekly - 3 sets - 3 reps - 20-30 sec. hold - Seated Passive Cervical Retraction - 1 x daily - 4-5 x weekly - 2 sets - 10 reps - Seated Shoulder Row with Anchored Resistance - 1 x daily - 4-5 x weekly - 2 sets - 15 reps   ASSESSMENT:  CLINICAL IMPRESSION: Patient is a 43 y.o. female who was seen today for physical therapy evaluation and  treatment for cervical neck pain. Pt has deficits related to postural dysfunction, cervical neck pain, decreased gross UE strength, and decreased active mobility of cervical spine. These deficits limit the patient's ability to complete household tasks, reach for objects, or lift heavy objects around her house which is required to take care of her dogs as patient lives by herself without significant difficulty or pain. Pt will benefit from skilled physical therapy intervention 1x a week for 6 weeks to address limitations listed above and maximize functional outcomes.    OBJECTIVE IMPAIRMENTS: decreased endurance, decreased mobility, decreased ROM, decreased strength, impaired UE functional use, postural dysfunction, and pain.   ACTIVITY LIMITATIONS: carrying, lifting, and bed mobility  PARTICIPATION LIMITATIONS: cleaning, laundry, and community activity  PERSONAL FACTORS: Fitness, Past/current experiences, Time since onset of injury/illness/exacerbation, and 1 comorbidity: hx of DVT are also affecting patient's functional outcome.   REHAB POTENTIAL: Good  CLINICAL DECISION MAKING: Evolving/moderate complexity  EVALUATION COMPLEXITY: Low   GOALS: Goals reviewed with patient? Yes  SHORT TERM GOALS: Target date: 07/23/2024  Pt. will not experience any radicular numbness and tingling sensations below the bilateral elbow regions so that she can carry household objects with less pain and with greater ease.  Baseline: bilateral N/T into fingers (L>R) Goal status: INITIAL  2.  Pt. will become independent with HEP to increase gross bilateral UE muscle strength to at least a 4/5 muscle grade to improve mobility/strength  to make reaching and carrying tasks at home easier to complete.  Baseline: see above Goal status: INITIAL   LONG TERM GOALS: Target date: 08/13/2024  Pt. will score an 20% or less on Neck Disability Index questionnaire so that she experiences a self perceived change in how her  neck pain affects her daily activities.  Baseline: 32% Goal status: INITIAL  2.  Pt. will experience no more than 4/10 cervical neck pain at it's worst so that she is able to increase sleep quality without waking up in pain.  Baseline: 10/10 at its worst with pain in middle of night,  Goal status: INITIAL  3.  Pt. will increase cervical left lateral flexion AROM to at least 40 deg. to minimize risk for muscular stiffness in upper traps region.  Baseline: 35 deg Goal status: INITIAL   PLAN:  PT FREQUENCY: 1x/week  PT DURATION: 6 weeks  PLANNED INTERVENTIONS: 97164- PT Re-evaluation, 97110-Therapeutic exercises, 97530- Therapeutic activity, 97112- Neuromuscular re-education, 97535- Self Care, [id number]- Manual therapy, Patient/Family education, Joint mobilization, Joint manipulation, Spinal manipulation, Spinal mobilization, Cryotherapy, and Moist heat  PLAN FOR NEXT SESSION: Assess response to HEP, assess periscapular strength in prone, STM to cervical and thoracic paraspinals as needed, progress strengthening to promote upright posture of UE  Ozell JAYSON Sero, PT, DPT # [id number] Curtistine Bracket, Student-PT 07/02/2024, 2:26 PM

## 2024-07-03 ENCOUNTER — Encounter: Payer: Self-pay | Admitting: Physical Therapy

## 2024-07-08 ENCOUNTER — Ambulatory Visit: Admitting: Physical Therapy

## 2024-07-10 ENCOUNTER — Ambulatory Visit: Attending: Nurse Practitioner | Admitting: Physical Therapy

## 2024-07-10 DIAGNOSIS — M25512 Pain in left shoulder: Secondary | ICD-10-CM | POA: Diagnosis present

## 2024-07-10 DIAGNOSIS — M542 Cervicalgia: Secondary | ICD-10-CM | POA: Diagnosis present

## 2024-07-10 DIAGNOSIS — M6281 Muscle weakness (generalized): Secondary | ICD-10-CM | POA: Diagnosis present

## 2024-07-10 DIAGNOSIS — G8929 Other chronic pain: Secondary | ICD-10-CM | POA: Diagnosis present

## 2024-07-10 DIAGNOSIS — M25612 Stiffness of left shoulder, not elsewhere classified: Secondary | ICD-10-CM | POA: Diagnosis present

## 2024-07-10 DIAGNOSIS — M792 Neuralgia and neuritis, unspecified: Secondary | ICD-10-CM | POA: Diagnosis present

## 2024-07-10 NOTE — Therapy (Signed)
 OUTPATIENT PHYSICAL THERAPY CERVICAL TREATMENT  Patient Name: Kelsey Bell MRN: [id number] DOB:1980-09-28, 43 y.o., female Today's Date: 07/10/2024  END OF SESSION:  PT End of Session - 07/10/24 1742     Visit Number 2    Number of Visits 12    Date for Recertification  08/13/24    PT Start Time 1600    PT Stop Time 1645    PT Time Calculation (min) 45 min    Activity Tolerance Patient tolerated treatment well;Patient limited by pain;Patient limited by fatigue    Behavior During Therapy [facility] for tasks assessed/performed          Past Medical History:  Diagnosis Date   Allergy    Anxiety    Arthritis    Asthma    Breast pain present for several months   Bil LT >RT across of breasts   CRPS (complex regional pain syndrome type I)    Depression    DVT (deep venous thrombosis) (HCC) 06/2018   Fibromyalgia    Kidney stones    LGSIL on Pap smear of cervix 2007   Migraine    Neuromuscular disorder (HCC)    Complex regional pain syndrome and Fibromyalgia   Pancreatic pseudocyst    Pancreatitis    Past Surgical History:  Procedure Laterality Date   COLPOSCOPY  2017   neg bx   ERCP     EXTRACORPOREAL SHOCK WAVE LITHOTRIPSY Right 04/26/2017   Procedure: EXTRACORPOREAL SHOCK WAVE LITHOTRIPSY (ESWL);  Surgeon: Penne Knee, MD;  Location: [facility] ORS;  Service: Urology;  Laterality: Right;   GASTROSTOMY-JEJEUNOSTOMY TUBE CHANGE/PLACEMENT     KNEE ARTHROSCOPY Right    nj placement     TONSILLECTOMY     UPPER ESOPHAGEAL ENDOSCOPIC ULTRASOUND (EUS)     UPPER GASTROINTESTINAL ENDOSCOPY     Patient Active Problem List   Diagnosis Date Noted   Orthostatic syncope    Dehydration 06/09/2020   Genetic testing 10/24/2018   Idiopathic acute pancreatitis 07/22/2018   DVT (deep venous thrombosis) (HCC) 06/04/2018   Acute pancreatitis 05/27/2018   Right knee pain 05/19/2018   Peripheral edema 03/05/2018   Neuropathic pain 11/02/2017   Kidney stone 04/25/2017   Ureteral stone     Migraines 12/15/2016   Morbid obesity with BMI of 45.0-49.9, adult (HCC) 10/20/2015   Asthma, moderate 05/21/2015   Polyarthralgia 12/01/2011   Fibromyalgia 09/18/2011   Low back pain 09/18/2011    PCP: Teresa Almarie Nam, NP  REFERRING PROVIDER: Shawnie Rickard LABOR, NP  REFERRING DIAG:  M25.50 (ICD-10-CM) - Pain in unspecified joint  M54.2 (ICD-10-CM) - Cervicalgia    THERAPY DIAG:  Muscle weakness (generalized)  Cervical pain (neck)  Radicular pain of left upper extremity  Radicular pain of right upper extremity  Rationale for Evaluation and Treatment: Rehabilitation  ONSET DATE: Chronic for at least > 10 years  SUBJECTIVE:  SUBJECTIVE STATEMENT: Pt is a 43 year old female who presents to physical therapy with chronic, 5/10 dull, stiff cervical neck pain which can reach a 10/10 at its worst. Pain can also cause headaches or patients TMJ to flair up with headaches being much more commonly occurring recently. Pt also states that she experiences bilateral upper trap tightness/pain with R>L and distal numbness and tingling sensations into bilateral extremities with L>R. Patient would like to decrease stiffness in neck region so that she may complete household tasks and lifting heavier objects such as her dog's food bags without significant pain.  Hand dominance: Left  PERTINENT HISTORY:  Patient has previously attempted to resolve chronic neck pain with physical therapy, dry needling, acupuncture, chiropractor, and use of Tylenol  and muscle relaxers. Pt has no previous surgical history in cervical spine.   PAIN:  Are you having pain? Yes: NPRS scale: 5/10 at rest in cervical spine, 10/10 at it's worst Pain location: Cervical spine, bilateral upper traps region (R>L), distal  symptoms into bilateral UEs (L>R) Pain description: Stiff, achy, constant Aggravating factors: stress, direct pressure to the area such as laying down on her back Relieving factors: muscle relaxers, rest, Biofreeze, Tylenol   PRECAUTIONS: None  RED FLAGS: Cervical pain that wakes patient up in middle of the night   WEIGHT BEARING RESTRICTIONS: No  FALLS:  Has patient fallen in last 6 months? No  LIVING ENVIRONMENT: Lives with: lives alone Lives in: House/apartment Stairs: No Has following equipment at home: None  OCCUPATION: Patient currently on disability  PLOF: Independent  PATIENT GOALS: Patient would like to decrease stiffness in neck region and improve overall UE strength so that she may complete household tasks and lifting heavier objects such as her dog's food bags without significant pain.  NEXT MD VISIT: None currently scheduled with referring provider  OBJECTIVE:  Note: Objective measures were completed at Evaluation unless otherwise noted.  DIAGNOSTIC FINDINGS:  N/A  PATIENT SURVEYS:  NDI: 32% self-perceived moderate disability  COGNITION: Overall cognitive status: Within functional limits for tasks assessed  SENSATION: Light touch: WFL  POSTURE: rounded shoulders, forward head, and increased thoracic kyphosis  PALPATION: Tender and mild pain upon palpation to suboccipital musculature and bilateral upper traps regions (L>R)   CERVICAL ROM:   Active ROM AROM (deg) eval  Flexion 44 deg  Extension 22 deg  Right lateral flexion 35 deg  Left lateral flexion 40 deg  Right rotation WFL  Left rotation WFL   (Blank rows = not tested)  UPPER EXTREMITY ROM:  Active ROM Right eval Left eval  Shoulder flexion WNL  120 deg  Shoulder extension    Shoulder abduction WNL 115 deg  Shoulder adduction    Shoulder extension    Shoulder internal rotation [facility] WFL  Shoulder external rotation [facility]  Elbow flexion    Elbow extension    Wrist flexion     Wrist extension    Wrist ulnar deviation    Wrist radial deviation    Wrist pronation    Wrist supination     (Blank rows = not tested)  UPPER EXTREMITY MMT:  MMT Right eval Left eval  Shoulder flexion 4+ 3+  Shoulder extension    Shoulder abduction 4 3+  Shoulder adduction    Shoulder extension    Shoulder internal rotation 4+ 4  Shoulder external rotation 4+ 4  Middle trapezius    Lower trapezius    Elbow flexion 5 5  Elbow extension 4+ 4+  Wrist flexion  5 5  Wrist extension    Wrist ulnar deviation    Wrist radial deviation    Wrist pronation    Wrist supination    Grip strength 52# 62.7#   (Blank rows = not tested)  CERVICAL SPECIAL TESTS:  Upper limb tension test (ULTT): Positive on LUE for concordant distal symptoms into elbow (median nerve)  TREATMENT DATE:   07/10/2024                                                                                                                               Subjective: Pt reports excruciating pain at the base of the left upper traps region and cervical spine at start of tx session. Pt reports adherence to HEP following initial evaluation but states that she has had a headache everyday for the past week.  Pt. Had an appt. With Chiro this morning but HA returned shortly after.    Manual Therapy:  Supine manual traction of cervical spine with suboccipital release Supine PROM of cervical spine (bilateral rotation and bilateral lateral flexion) Supine median nerve glides LUE  Therapeutic Exercises: Supine serratus punches, 2# DBs, 2 x 10 Seated shoulder retractions, RTB, 2 x 12 Seated horizontal abduction with RTB, 2 x12 Supine cervical retractions, 2 x 10 Standing Nautilus lat pulldowns, 30#, 2 x 12 Standing Nautilus tricep extensions, 30#, 2 x 8  Seated with MHP over cervical spine and right upper traps while discussing symptoms, response to HEP, and constant headaches  PATIENT EDUCATION:  Education details: Pt  educated on POC, HEP, diagnosis, prognosis, and anatomy involved Person educated: Patient Education method: Explanation, Demonstration, Tactile cues, Verbal cues, and Handouts Education comprehension: verbalized understanding and returned demonstration  HOME EXERCISE PROGRAM: Access Code: [email or web address] URL: [email or web address] Date: 07/02/2024 Prepared by: Ozell Sero Exercises - Seated Upper Trapezius Stretch - 1 x daily - 4-5 x weekly - 3 sets - 3 reps - 20-30 sec. hold - Seated Passive Cervical Retraction - 1 x daily - 4-5 x weekly - 2 sets - 10 reps - Seated Shoulder Row with Anchored Resistance - 1 x daily - 4-5 x weekly - 2 sets - 15 reps   ASSESSMENT:  CLINICAL IMPRESSION: Pt presents to physical therapy with mild pain levels in cervical spine and left upper trap with distal symptoms radiating into LUE. Pt experienced increase in right sided cervical pain radiating into head causing headach with PROM cervical rotation to the right side which was partially alleviated with rest. Initiated supine median nerve glides which patient responded well to.  PT initiated manual traction of cervical spine and suboccipital release in supine which patient found to be relieving. Will continue to monitor and progress as able.    OBJECTIVE IMPAIRMENTS: decreased endurance, decreased mobility, decreased ROM, decreased strength, impaired UE functional use, postural dysfunction, and pain.   ACTIVITY LIMITATIONS: carrying, lifting, and bed mobility  PARTICIPATION LIMITATIONS: cleaning, laundry, and community activity  PERSONAL FACTORS: Fitness, Past/current  experiences, Time since onset of injury/illness/exacerbation, and 1 comorbidity: hx of DVT are also affecting patient's functional outcome.   REHAB POTENTIAL: Good  CLINICAL DECISION MAKING: Evolving/moderate complexity  EVALUATION COMPLEXITY: Low   GOALS: Goals reviewed with patient? Yes  SHORT TERM GOALS: Target date:  07/23/2024  Pt. will not experience any radicular numbness and tingling sensations below the bilateral elbow regions so that she can carry household objects with less pain and with greater ease.  Baseline: bilateral N/T into fingers (L>R) Goal status: INITIAL  2.  Pt. will become independent with HEP to increase gross bilateral UE muscle strength to at least a 4/5 muscle grade to improve mobility/strength to make reaching and carrying tasks at home easier to complete.  Baseline: see above Goal status: INITIAL   LONG TERM GOALS: Target date: 08/13/2024  Pt. will score an 20% or less on Neck Disability Index questionnaire so that she experiences a self perceived change in how her neck pain affects her daily activities.  Baseline: 32% Goal status: INITIAL  2.  Pt. will experience no more than 4/10 cervical neck pain at it's worst so that she is able to increase sleep quality without waking up in pain.  Baseline: 10/10 at its worst with pain in middle of night,  Goal status: INITIAL  3.  Pt. will increase cervical left lateral flexion AROM to at least 40 deg. to minimize risk for muscular stiffness in upper traps region.  Baseline: 35 deg Goal status: INITIAL   PLAN:  PT FREQUENCY: 1x/week  PT DURATION: 6 weeks  PLANNED INTERVENTIONS: 97164- PT Re-evaluation, 97110-Therapeutic exercises, 97530- Therapeutic activity, 97112- Neuromuscular re-education, 97535- Self Care, [id number]- Manual therapy, Patient/Family education, Joint mobilization, Joint manipulation, Spinal manipulation, Spinal mobilization, Cryotherapy, and Moist heat  PLAN FOR NEXT SESSION: Continue manual therapy techniques to ease distal symptoms and pain in cervical spine while continuing periscapular strengthening activities to tolerance  Ozell JAYSON Sero, PT, DPT # [id number] Curtistine Bracket, Student-PT 07/10/2024, 5:45 PM

## 2024-07-15 ENCOUNTER — Ambulatory Visit: Admitting: Physical Therapy

## 2024-07-17 ENCOUNTER — Ambulatory Visit: Admitting: Physical Therapy

## 2024-07-17 DIAGNOSIS — M6281 Muscle weakness (generalized): Secondary | ICD-10-CM

## 2024-07-17 DIAGNOSIS — G8929 Other chronic pain: Secondary | ICD-10-CM

## 2024-07-17 DIAGNOSIS — M792 Neuralgia and neuritis, unspecified: Secondary | ICD-10-CM

## 2024-07-17 DIAGNOSIS — M542 Cervicalgia: Secondary | ICD-10-CM

## 2024-07-17 NOTE — Therapy (Unsigned)
 OUTPATIENT PHYSICAL THERAPY CERVICAL TREATMENT  Patient Name: Kelsey Bell MRN: [id number] DOB:06/09/1981, 43 y.o., female Today's Date: 07/17/2024  END OF SESSION:  PT End of Session - 07/17/24 1204     Visit Number 3    Number of Visits 12    Date for Recertification  08/13/24    PT Start Time 1038    PT Stop Time 1117    PT Time Calculation (min) 39 min    Activity Tolerance Patient tolerated treatment well;Patient limited by pain;Patient limited by fatigue    Behavior During Therapy [facility] for tasks assessed/performed           Past Medical History:  Diagnosis Date   Allergy    Anxiety    Arthritis    Asthma    Breast pain present for several months   Bil LT >RT across of breasts   CRPS (complex regional pain syndrome type I)    Depression    DVT (deep venous thrombosis) (HCC) 06/2018   Fibromyalgia    Kidney stones    LGSIL on Pap smear of cervix 2007   Migraine    Neuromuscular disorder (HCC)    Complex regional pain syndrome and Fibromyalgia   Pancreatic pseudocyst    Pancreatitis    Past Surgical History:  Procedure Laterality Date   COLPOSCOPY  2017   neg bx   ERCP     EXTRACORPOREAL SHOCK WAVE LITHOTRIPSY Right 04/26/2017   Procedure: EXTRACORPOREAL SHOCK WAVE LITHOTRIPSY (ESWL);  Surgeon: Penne Knee, MD;  Location: [facility] ORS;  Service: Urology;  Laterality: Right;   GASTROSTOMY-JEJEUNOSTOMY TUBE CHANGE/PLACEMENT     KNEE ARTHROSCOPY Right    nj placement     TONSILLECTOMY     UPPER ESOPHAGEAL ENDOSCOPIC ULTRASOUND (EUS)     UPPER GASTROINTESTINAL ENDOSCOPY     Patient Active Problem List   Diagnosis Date Noted   Orthostatic syncope    Dehydration 06/09/2020   Genetic testing 10/24/2018   Idiopathic acute pancreatitis 07/22/2018   DVT (deep venous thrombosis) (HCC) 06/04/2018   Acute pancreatitis 05/27/2018   Right knee pain 05/19/2018   Peripheral edema 03/05/2018   Neuropathic pain 11/02/2017   Kidney stone 04/25/2017   Ureteral  stone    Migraines 12/15/2016   Morbid obesity with BMI of 45.0-49.9, adult (HCC) 10/20/2015   Asthma, moderate 05/21/2015   Polyarthralgia 12/01/2011   Fibromyalgia 09/18/2011   Low back pain 09/18/2011    PCP: Teresa Almarie Nam, NP  REFERRING PROVIDER: Shawnie Rickard LABOR, NP  REFERRING DIAG:  M25.50 (ICD-10-CM) - Pain in unspecified joint  M54.2 (ICD-10-CM) - Cervicalgia    THERAPY DIAG:  Muscle weakness (generalized)  Cervical pain (neck)  Radicular pain of left upper extremity  Radicular pain of right upper extremity  Chronic left shoulder pain  Rationale for Evaluation and Treatment: Rehabilitation  ONSET DATE: Chronic for at least > 10 years  SUBJECTIVE:  SUBJECTIVE STATEMENT: Pt is a 43 year old female who presents to physical therapy with chronic, 5/10 dull, stiff cervical neck pain which can reach a 10/10 at its worst. Pain can also cause headaches or patients TMJ to flair up with headaches being much more commonly occurring recently. Pt also states that she experiences bilateral upper trap tightness/pain with R>L and distal numbness and tingling sensations into bilateral extremities with L>R. Patient would like to decrease stiffness in neck region so that she may complete household tasks and lifting heavier objects such as her dog's food bags without significant pain.  Hand dominance: Left  PERTINENT HISTORY:  Patient has previously attempted to resolve chronic neck pain with physical therapy, dry needling, acupuncture, chiropractor, and use of Tylenol  and muscle relaxers. Pt has no previous surgical history in cervical spine.   PAIN:  Are you having pain? Yes: NPRS scale: 5/10 at rest in cervical spine, 10/10 at it's worst Pain location: Cervical spine,  bilateral upper traps region (R>L), distal symptoms into bilateral UEs (L>R) Pain description: Stiff, achy, constant Aggravating factors: stress, direct pressure to the area such as laying down on her back Relieving factors: muscle relaxers, rest, Biofreeze, Tylenol   PRECAUTIONS: None  RED FLAGS: Cervical pain that wakes patient up in middle of the night   WEIGHT BEARING RESTRICTIONS: No  FALLS:  Has patient fallen in last 6 months? No  LIVING ENVIRONMENT: Lives with: lives alone Lives in: House/apartment Stairs: No Has following equipment at home: None  OCCUPATION: Patient currently on disability  PLOF: Independent  PATIENT GOALS: Patient would like to decrease stiffness in neck region and improve overall UE strength so that she may complete household tasks and lifting heavier objects such as her dog's food bags without significant pain.  NEXT MD VISIT: None currently scheduled with referring provider  OBJECTIVE:  Note: Objective measures were completed at Evaluation unless otherwise noted.  DIAGNOSTIC FINDINGS:  N/A  PATIENT SURVEYS:  NDI: 32% self-perceived moderate disability  COGNITION: Overall cognitive status: Within functional limits for tasks assessed  SENSATION: Light touch: WFL  POSTURE: rounded shoulders, forward head, and increased thoracic kyphosis  PALPATION: Tender and mild pain upon palpation to suboccipital musculature and bilateral upper traps regions (L>R)   CERVICAL ROM:   Active ROM AROM (deg) eval  Flexion 44 deg  Extension 22 deg  Right lateral flexion 35 deg  Left lateral flexion 40 deg  Right rotation WFL  Left rotation WFL   (Blank rows = not tested)  UPPER EXTREMITY ROM:  Active ROM Right eval Left eval  Shoulder flexion WNL  120 deg  Shoulder extension    Shoulder abduction WNL 115 deg  Shoulder adduction    Shoulder extension    Shoulder internal rotation [facility] WFL  Shoulder external rotation [facility]  Elbow flexion     Elbow extension    Wrist flexion    Wrist extension    Wrist ulnar deviation    Wrist radial deviation    Wrist pronation    Wrist supination     (Blank rows = not tested)  UPPER EXTREMITY MMT:  MMT Right eval Left eval  Shoulder flexion 4+ 3+  Shoulder extension    Shoulder abduction 4 3+  Shoulder adduction    Shoulder extension    Shoulder internal rotation 4+ 4  Shoulder external rotation 4+ 4  Middle trapezius    Lower trapezius    Elbow flexion 5 5  Elbow extension 4+ 4+  Wrist flexion  5 5  Wrist extension    Wrist ulnar deviation    Wrist radial deviation    Wrist pronation    Wrist supination    Grip strength 52# 62.7#   (Blank rows = not tested)  CERVICAL SPECIAL TESTS:  Upper limb tension test (ULTT): Positive on LUE for concordant distal symptoms into elbow (median nerve)  TREATMENT DATE:   07/17/2024                                                                                                                               Subjective: Pt reports continued left upper traps and cervical spine discomfort at start of tx session with symptoms radiating into left shoulder region. Pt also reports continued headaches since last visit which have worsened following a fall at the top of the stairs in her home this past Saturday (11/15) where she hit her head. Pt was carrying objects to decorate her home and fell on a large cardboard box resulting in bruises over bilateral knees and forearms and a small bump on her head.   Manual Therapy:  Supine manual traction of cervical spine with suboccipital release Supine PROM of cervical spine (bilateral rotation and bilateral lateral flexion) Supine manual bilateral upper traps stretch, 3 x 30 seconds bilaterally Supine median nerve glides LUE STM to bilateral upper traps and cervical and thoracic paraspinals region with use of Hypervolt in prone position  Therapeutic Exercises: Supine serratus punches, 3# DBs, 2 x  10 Seated horizontal abduction with GTB, 2 x12 Supine cervical retractions, 2 x 10 Seated scapular retractions, GTB, 2 x 12  Bruising noted over bilateral patellas and forearms following patient's fall last weekend.   Not today:  Standing Nautilus lat pulldowns, 30#, 2 x 12 Standing Nautilus tricep extensions, 30#, 2 x 8  PATIENT EDUCATION:  Education details: Pt educated on POC, HEP, diagnosis, prognosis, and anatomy involved Person educated: Patient Education method: Explanation, Demonstration, Tactile cues, Verbal cues, and Handouts Education comprehension: verbalized understanding and returned demonstration  HOME EXERCISE PROGRAM: Access Code: [email or web address] URL: [email or web address] Date: 07/02/2024 Prepared by: Ozell Sero Exercises - Seated Upper Trapezius Stretch - 1 x daily - 4-5 x weekly - 3 sets - 3 reps - 20-30 sec. hold - Seated Passive Cervical Retraction - 1 x daily - 4-5 x weekly - 2 sets - 10 reps - Seated Shoulder Row with Anchored Resistance - 1 x daily - 4-5 x weekly - 2 sets - 15 reps   ASSESSMENT:  CLINICAL IMPRESSION: Pt presents to physical therapy with pain in cervical spine with distal symptoms radiating into LUE. PT initiated STM to bilateral upper traps, cervical paraspinals, and thoracic paraspinals with use of Hypervolt in prone and supine manual bilateral upper traps stretch to address upper traps stiffness which patient found to be relieving. Pt continues to find some relief of distal sx with supine median nerve glides. Pt was progressed to completing supine serratus punches for  increased resistance on this day which she tolerated well. Pt reported no increase in cervical neck pain or headaches at end of tx session. Will continue to monitor and progress as able.    OBJECTIVE IMPAIRMENTS: decreased endurance, decreased mobility, decreased ROM, decreased strength, impaired UE functional use, postural dysfunction, and pain.   ACTIVITY LIMITATIONS:  carrying, lifting, and bed mobility  PARTICIPATION LIMITATIONS: cleaning, laundry, and community activity  PERSONAL FACTORS: Fitness, Past/current experiences, Time since onset of injury/illness/exacerbation, and 1 comorbidity: hx of DVT are also affecting patient's functional outcome.   REHAB POTENTIAL: Good  CLINICAL DECISION MAKING: Evolving/moderate complexity  EVALUATION COMPLEXITY: Low   GOALS: Goals reviewed with patient? Yes  SHORT TERM GOALS: Target date: 07/23/2024  Pt. will not experience any radicular numbness and tingling sensations below the bilateral elbow regions so that she can carry household objects with less pain and with greater ease.  Baseline: bilateral N/T into fingers (L>R) Goal status: INITIAL  2.  Pt. will become independent with HEP to increase gross bilateral UE muscle strength to at least a 4/5 muscle grade to improve mobility/strength to make reaching and carrying tasks at home easier to complete.  Baseline: see above Goal status: INITIAL   LONG TERM GOALS: Target date: 08/13/2024  Pt. will score an 20% or less on Neck Disability Index questionnaire so that she experiences a self perceived change in how her neck pain affects her daily activities.  Baseline: 32% Goal status: INITIAL  2.  Pt. will experience no more than 4/10 cervical neck pain at it's worst so that she is able to increase sleep quality without waking up in pain.  Baseline: 10/10 at its worst with pain in middle of night,  Goal status: INITIAL  3.  Pt. will increase cervical left lateral flexion AROM to at least 40 deg. to minimize risk for muscular stiffness in upper traps region.  Baseline: 35 deg Goal status: INITIAL   PLAN:  PT FREQUENCY: 1x/week  PT DURATION: 6 weeks  PLANNED INTERVENTIONS: 97164- PT Re-evaluation, 97110-Therapeutic exercises, 97530- Therapeutic activity, 97112- Neuromuscular re-education, 97535- Self Care, [id number]- Manual therapy, Patient/Family  education, Joint mobilization, Joint manipulation, Spinal manipulation, Spinal mobilization, Cryotherapy, and Moist heat  PLAN FOR NEXT SESSION: Continue manual therapy techniques to ease distal symptoms and pain in cervical spine while continuing periscapular strengthening activities to tolerance  Ozell JAYSON Sero, PT, DPT # [id number] Curtistine Bracket, Student-PT 07/17/2024, 12:06 PM

## 2024-07-22 ENCOUNTER — Ambulatory Visit: Admitting: Physical Therapy

## 2024-07-25 ENCOUNTER — Ambulatory Visit: Admitting: Physical Therapy

## 2024-07-25 DIAGNOSIS — M6281 Muscle weakness (generalized): Secondary | ICD-10-CM

## 2024-07-25 DIAGNOSIS — M542 Cervicalgia: Secondary | ICD-10-CM

## 2024-07-25 DIAGNOSIS — M792 Neuralgia and neuritis, unspecified: Secondary | ICD-10-CM

## 2024-07-25 NOTE — Therapy (Signed)
 OUTPATIENT PHYSICAL THERAPY CERVICAL TREATMENT  Patient Name: Kelsey Bell MRN: [id number] DOB:Jan 16, 1981, 43 y.o., female Today's Date: 07/25/2024  END OF SESSION:  PT End of Session - 07/25/24 0906     Visit Number 4    Number of Visits 12    Date for Recertification  08/13/24    PT Start Time 0902    PT Stop Time 0947    PT Time Calculation (min) 45 min    Activity Tolerance Patient tolerated treatment well;Patient limited by pain;Patient limited by fatigue    Behavior During Therapy [facility] for tasks assessed/performed         Past Medical History:  Diagnosis Date   Allergy    Anxiety    Arthritis    Asthma    Breast pain present for several months   Bil LT >RT across of breasts   CRPS (complex regional pain syndrome type I)    Depression    DVT (deep venous thrombosis) (HCC) 06/2018   Fibromyalgia    Kidney stones    LGSIL on Pap smear of cervix 2007   Migraine    Neuromuscular disorder (HCC)    Complex regional pain syndrome and Fibromyalgia   Pancreatic pseudocyst    Pancreatitis    Past Surgical History:  Procedure Laterality Date   COLPOSCOPY  2017   neg bx   ERCP     EXTRACORPOREAL SHOCK WAVE LITHOTRIPSY Right 04/26/2017   Procedure: EXTRACORPOREAL SHOCK WAVE LITHOTRIPSY (ESWL);  Surgeon: Penne Knee, MD;  Location: [facility] ORS;  Service: Urology;  Laterality: Right;   GASTROSTOMY-JEJEUNOSTOMY TUBE CHANGE/PLACEMENT     KNEE ARTHROSCOPY Right    nj placement     TONSILLECTOMY     UPPER ESOPHAGEAL ENDOSCOPIC ULTRASOUND (EUS)     UPPER GASTROINTESTINAL ENDOSCOPY     Patient Active Problem List   Diagnosis Date Noted   Orthostatic syncope    Dehydration 06/09/2020   Genetic testing 10/24/2018   Idiopathic acute pancreatitis 07/22/2018   DVT (deep venous thrombosis) (HCC) 06/04/2018   Acute pancreatitis 05/27/2018   Right knee pain 05/19/2018   Peripheral edema 03/05/2018   Neuropathic pain 11/02/2017   Kidney stone 04/25/2017   Ureteral stone     Migraines 12/15/2016   Morbid obesity with BMI of 45.0-49.9, adult (HCC) 10/20/2015   Asthma, moderate 05/21/2015   Polyarthralgia 12/01/2011   Fibromyalgia 09/18/2011   Low back pain 09/18/2011    PCP: Teresa Almarie Nam, NP  REFERRING PROVIDER: Shawnie Rickard LABOR, NP  REFERRING DIAG:  M25.50 (ICD-10-CM) - Pain in unspecified joint  M54.2 (ICD-10-CM) - Cervicalgia    THERAPY DIAG:  Muscle weakness (generalized)  Cervical pain (neck)  Radicular pain of left upper extremity  Radicular pain of right upper extremity  Rationale for Evaluation and Treatment: Rehabilitation  ONSET DATE: Chronic for at least > 10 years  SUBJECTIVE:  SUBJECTIVE STATEMENT: Pt is a 43 year old female who presents to physical therapy with chronic, 5/10 dull, stiff cervical neck pain which can reach a 10/10 at its worst. Pain can also cause headaches or patients TMJ to flair up with headaches being much more commonly occurring recently. Pt also states that she experiences bilateral upper trap tightness/pain with R>L and distal numbness and tingling sensations into bilateral extremities with L>R. Patient would like to decrease stiffness in neck region so that she may complete household tasks and lifting heavier objects such as her dog's food bags without significant pain.  Hand dominance: Left  PERTINENT HISTORY:  Patient has previously attempted to resolve chronic neck pain with physical therapy, dry needling, acupuncture, chiropractor, and use of Tylenol  and muscle relaxers. Pt has no previous surgical history in cervical spine.   PAIN:  Are you having pain? Yes: NPRS scale: 5/10 at rest in cervical spine, 10/10 at it's worst Pain location: Cervical spine, bilateral upper traps region (R>L), distal  symptoms into bilateral UEs (L>R) Pain description: Stiff, achy, constant Aggravating factors: stress, direct pressure to the area such as laying down on her back Relieving factors: muscle relaxers, rest, Biofreeze, Tylenol   PRECAUTIONS: None  RED FLAGS: Cervical pain that wakes patient up in middle of the night   WEIGHT BEARING RESTRICTIONS: No  FALLS:  Has patient fallen in last 6 months? No  LIVING ENVIRONMENT: Lives with: lives alone Lives in: House/apartment Stairs: No Has following equipment at home: None  OCCUPATION: Patient currently on disability  PLOF: Independent  PATIENT GOALS: Patient would like to decrease stiffness in neck region and improve overall UE strength so that she may complete household tasks and lifting heavier objects such as her dog's food bags without significant pain.  NEXT MD VISIT: None currently scheduled with referring provider  OBJECTIVE:  Note: Objective measures were completed at Evaluation unless otherwise noted.  DIAGNOSTIC FINDINGS:  N/A  PATIENT SURVEYS:  NDI: 32% self-perceived moderate disability  COGNITION: Overall cognitive status: Within functional limits for tasks assessed  SENSATION: Light touch: WFL  POSTURE: rounded shoulders, forward head, and increased thoracic kyphosis  PALPATION: Tender and mild pain upon palpation to suboccipital musculature and bilateral upper traps regions (L>R)   CERVICAL ROM:   Active ROM AROM (deg) eval  Flexion 44 deg  Extension 22 deg  Right lateral flexion 35 deg  Left lateral flexion 40 deg  Right rotation WFL  Left rotation WFL   (Blank rows = not tested)  UPPER EXTREMITY ROM:  Active ROM Right eval Left eval  Shoulder flexion WNL  120 deg  Shoulder extension    Shoulder abduction WNL 115 deg  Shoulder adduction    Shoulder extension    Shoulder internal rotation [facility] WFL  Shoulder external rotation [facility]  Elbow flexion    Elbow extension    Wrist flexion     Wrist extension    Wrist ulnar deviation    Wrist radial deviation    Wrist pronation    Wrist supination     (Blank rows = not tested)  UPPER EXTREMITY MMT:  MMT Right eval Left eval  Shoulder flexion 4+ 3+  Shoulder extension    Shoulder abduction 4 3+  Shoulder adduction    Shoulder extension    Shoulder internal rotation 4+ 4  Shoulder external rotation 4+ 4  Middle trapezius    Lower trapezius    Elbow flexion 5 5  Elbow extension 4+ 4+  Wrist flexion  5 5  Wrist extension    Wrist ulnar deviation    Wrist radial deviation    Wrist pronation    Wrist supination    Grip strength 52# 62.7#   (Blank rows = not tested)  CERVICAL SPECIAL TESTS:  Upper limb tension test (ULTT): Positive on LUE for concordant distal symptoms into elbow (median nerve)  TREATMENT DATE:   07/25/2024                                                                                                                               Subjective: Pt reports continued left upper traps and cervical spine discomfort at start of tx session with symptoms radiating into left shoulder region. Pt also reports continued headaches which she says could potentially be stress related.   Manual Therapy:  Supine manual traction of cervical spine with suboccipital release Supine PROM of cervical spine (bilateral rotation and bilateral lateral flexion) Supine manual bilateral upper traps stretch, 3 x 30 seconds bilaterally Supine median nerve glides LUE STM to bilateral upper traps and cervical and thoracic paraspinals region with use of Hypervolt in prone position  Therapeutic Exercises: Seated horizontal abduction with GTB, 2 x12  Seated scapular retractions, GTB, 2 x 12 Prone Is, Ys, Ts, 1 x 15 each Standing pec stretch in doorway, 3 x 30 seconds   Not today:  Standing Nautilus lat pulldowns, 30#, 2 x 12 Standing Nautilus tricep extensions, 30#, 2 x 8 Supine cervical retractions, 2 x 10  Supine serratus  punches, 3# DBs, 2 x 10   PATIENT EDUCATION:  Education details: Pt educated on POC, HEP, diagnosis, prognosis, and anatomy involved Person educated: Patient Education method: Explanation, Demonstration, Tactile cues, Verbal cues, and Handouts Education comprehension: verbalized understanding and returned demonstration  HOME EXERCISE PROGRAM: Access Code: [email or web address] URL: [email or web address] Date: 07/02/2024 Prepared by: Ozell Sero Exercises - Seated Upper Trapezius Stretch - 1 x daily - 4-5 x weekly - 3 sets - 3 reps - 20-30 sec. hold - Seated Passive Cervical Retraction - 1 x daily - 4-5 x weekly - 2 sets - 10 reps - Seated Shoulder Row with Anchored Resistance - 1 x daily - 4-5 x weekly - 2 sets - 15 reps   ASSESSMENT:  CLINICAL IMPRESSION: Pt presents to physical therapy with pain in cervical spine with distal symptoms radiating into LUE. Pt continues to find relief with STM to bilateral upper traps, cervical paraspinals, and thoracic paraspinals with use of Hypervolt in prone position. Pt found supine median nerve glides to be relieving of distal symptoms into LUE on this day. Pt was introduced to prone Is, Ys, and Ts on this day which she tolerated well but did experience fatigue in BUE upon completion. Pt was also introduced to standing doorway pec stretch on this day which she found to be relieving. Pt reported no increase in cervical neck pain or headaches at end of tx session along with a reduction  in LUE symptoms. Will continue to monitor pt symptoms and progress to tolerance.    OBJECTIVE IMPAIRMENTS: decreased endurance, decreased mobility, decreased ROM, decreased strength, impaired UE functional use, postural dysfunction, and pain.   ACTIVITY LIMITATIONS: carrying, lifting, and bed mobility  PARTICIPATION LIMITATIONS: cleaning, laundry, and community activity  PERSONAL FACTORS: Fitness, Past/current experiences, Time since onset of injury/illness/exacerbation, and  1 comorbidity: hx of DVT are also affecting patient's functional outcome.   REHAB POTENTIAL: Good  CLINICAL DECISION MAKING: Evolving/moderate complexity  EVALUATION COMPLEXITY: Low   GOALS: Goals reviewed with patient? Yes  SHORT TERM GOALS: Target date: 07/23/2024  Pt. will not experience any radicular numbness and tingling sensations below the bilateral elbow regions so that she can carry household objects with less pain and with greater ease.  Baseline: bilateral N/T into fingers (L>R) Goal status: INITIAL  2.  Pt. will become independent with HEP to increase gross bilateral UE muscle strength to at least a 4/5 muscle grade to improve mobility/strength to make reaching and carrying tasks at home easier to complete.  Baseline: see above Goal status: INITIAL   LONG TERM GOALS: Target date: 08/13/2024  Pt. will score an 20% or less on Neck Disability Index questionnaire so that she experiences a self perceived change in how her neck pain affects her daily activities.  Baseline: 32% Goal status: INITIAL  2.  Pt. will experience no more than 4/10 cervical neck pain at it's worst so that she is able to increase sleep quality without waking up in pain.  Baseline: 10/10 at its worst with pain in middle of night,  Goal status: INITIAL  3.  Pt. will increase cervical left lateral flexion AROM to at least 40 deg. to minimize risk for muscular stiffness in upper traps region.  Baseline: 35 deg Goal status: INITIAL   PLAN:  PT FREQUENCY: 1x/week  PT DURATION: 6 weeks  PLANNED INTERVENTIONS: 97164- PT Re-evaluation, 97110-Therapeutic exercises, 97530- Therapeutic activity, 97112- Neuromuscular re-education, 97535- Self Care, [id number]- Manual therapy, Patient/Family education, Joint mobilization, Joint manipulation, Spinal manipulation, Spinal mobilization, Cryotherapy, and Moist heat  PLAN FOR NEXT SESSION: Continue manual therapy techniques to ease distal symptoms and pain in  cervical spine while continuing periscapular strengthening activities to tolerance. Update STGs next session.   Ozell JAYSON Sero, PT, DPT # [id number] Curtistine Bracket, Student-PT 07/25/2024, 9:49 AM

## 2024-07-29 ENCOUNTER — Encounter: Payer: Self-pay | Admitting: Physical Therapy

## 2024-07-29 ENCOUNTER — Ambulatory Visit: Admitting: Physical Therapy

## 2024-07-29 DIAGNOSIS — M25612 Stiffness of left shoulder, not elsewhere classified: Secondary | ICD-10-CM

## 2024-07-29 DIAGNOSIS — M6281 Muscle weakness (generalized): Secondary | ICD-10-CM | POA: Diagnosis not present

## 2024-07-29 DIAGNOSIS — M542 Cervicalgia: Secondary | ICD-10-CM

## 2024-07-29 DIAGNOSIS — G8929 Other chronic pain: Secondary | ICD-10-CM

## 2024-07-29 DIAGNOSIS — M792 Neuralgia and neuritis, unspecified: Secondary | ICD-10-CM

## 2024-07-29 NOTE — Therapy (Signed)
 OUTPATIENT PHYSICAL THERAPY CERVICAL TREATMENT  Patient Name: Kelsey Bell MRN: [id number] DOB:1980-09-08, 43 y.o., female Today's Date: 07/29/2024  END OF SESSION:  PT End of Session - 07/29/24 1508     Visit Number 5    Number of Visits 12    Date for Recertification  08/13/24    PT Start Time 1512    PT Stop Time 1601    PT Time Calculation (min) 49 min    Activity Tolerance Patient tolerated treatment well;Patient limited by pain;Patient limited by fatigue    Behavior During Therapy [facility] for tasks assessed/performed         Past Medical History:  Diagnosis Date   Allergy    Anxiety    Arthritis    Asthma    Breast pain present for several months   Bil LT >RT across of breasts   CRPS (complex regional pain syndrome type I)    Depression    DVT (deep venous thrombosis) (HCC) 06/2018   Fibromyalgia    Kidney stones    LGSIL on Pap smear of cervix 2007   Migraine    Neuromuscular disorder (HCC)    Complex regional pain syndrome and Fibromyalgia   Pancreatic pseudocyst    Pancreatitis    Past Surgical History:  Procedure Laterality Date   COLPOSCOPY  2017   neg bx   ERCP     EXTRACORPOREAL SHOCK WAVE LITHOTRIPSY Right 04/26/2017   Procedure: EXTRACORPOREAL SHOCK WAVE LITHOTRIPSY (ESWL);  Surgeon: Penne Knee, MD;  Location: [facility] ORS;  Service: Urology;  Laterality: Right;   GASTROSTOMY-JEJEUNOSTOMY TUBE CHANGE/PLACEMENT     KNEE ARTHROSCOPY Right    nj placement     TONSILLECTOMY     UPPER ESOPHAGEAL ENDOSCOPIC ULTRASOUND (EUS)     UPPER GASTROINTESTINAL ENDOSCOPY     Patient Active Problem List   Diagnosis Date Noted   Orthostatic syncope    Dehydration 06/09/2020   Genetic testing 10/24/2018   Idiopathic acute pancreatitis 07/22/2018   DVT (deep venous thrombosis) (HCC) 06/04/2018   Acute pancreatitis 05/27/2018   Right knee pain 05/19/2018   Peripheral edema 03/05/2018   Neuropathic pain 11/02/2017   Kidney stone 04/25/2017   Ureteral stone     Migraines 12/15/2016   Morbid obesity with BMI of 45.0-49.9, adult (HCC) 10/20/2015   Asthma, moderate 05/21/2015   Polyarthralgia 12/01/2011   Fibromyalgia 09/18/2011   Low back pain 09/18/2011    PCP: Teresa Almarie Nam, NP  REFERRING PROVIDER: Shawnie Rickard LABOR, NP  REFERRING DIAG:  M25.50 (ICD-10-CM) - Pain in unspecified joint  M54.2 (ICD-10-CM) - Cervicalgia    THERAPY DIAG:  Muscle weakness (generalized)  Cervical pain (neck)  Radicular pain of left upper extremity  Radicular pain of right upper extremity  Chronic left shoulder pain  Shoulder joint stiffness, left  Rationale for Evaluation and Treatment: Rehabilitation  ONSET DATE: Chronic for at least > 10 years  SUBJECTIVE:  SUBJECTIVE STATEMENT: Pt is a 43 year old female who presents to physical therapy with chronic, 5/10 dull, stiff cervical neck pain which can reach a 10/10 at its worst. Pain can also cause headaches or patients TMJ to flair up with headaches being much more commonly occurring recently. Pt also states that she experiences bilateral upper trap tightness/pain with R>L and distal numbness and tingling sensations into bilateral extremities with L>R. Patient would like to decrease stiffness in neck region so that she may complete household tasks and lifting heavier objects such as her dog's food bags without significant pain.  Hand dominance: Left  PERTINENT HISTORY:  Patient has previously attempted to resolve chronic neck pain with physical therapy, dry needling, acupuncture, chiropractor, and use of Tylenol  and muscle relaxers. Pt has no previous surgical history in cervical spine.   PAIN:  Are you having pain? Yes: NPRS scale: 5/10 at rest in cervical spine, 10/10 at it's worst Pain  location: Cervical spine, bilateral upper traps region (R>L), distal symptoms into bilateral UEs (L>R) Pain description: Stiff, achy, constant Aggravating factors: stress, direct pressure to the area such as laying down on her back Relieving factors: muscle relaxers, rest, Biofreeze, Tylenol   PRECAUTIONS: None  RED FLAGS: Cervical pain that wakes patient up in middle of the night   WEIGHT BEARING RESTRICTIONS: No  FALLS:  Has patient fallen in last 6 months? No  LIVING ENVIRONMENT: Lives with: lives alone Lives in: House/apartment Stairs: No Has following equipment at home: None  OCCUPATION: Patient currently on disability  PLOF: Independent  PATIENT GOALS: Patient would like to decrease stiffness in neck region and improve overall UE strength so that she may complete household tasks and lifting heavier objects such as her dog's food bags without significant pain.  NEXT MD VISIT: None currently scheduled with referring provider  OBJECTIVE:  Note: Objective measures were completed at Evaluation unless otherwise noted.  DIAGNOSTIC FINDINGS:  N/A  PATIENT SURVEYS:  NDI: 32% self-perceived moderate disability  COGNITION: Overall cognitive status: Within functional limits for tasks assessed  SENSATION: Light touch: WFL  POSTURE: rounded shoulders, forward head, and increased thoracic kyphosis  PALPATION: Tender and mild pain upon palpation to suboccipital musculature and bilateral upper traps regions (L>R)   CERVICAL ROM:   Active ROM AROM (deg) eval  Flexion 44 deg  Extension 22 deg  Right lateral flexion 35 deg  Left lateral flexion 40 deg  Right rotation WFL  Left rotation WFL   (Blank rows = not tested)  UPPER EXTREMITY ROM:  Active ROM Right eval Left eval  Shoulder flexion WNL  120 deg  Shoulder extension    Shoulder abduction WNL 115 deg  Shoulder adduction    Shoulder extension    Shoulder internal rotation [facility] WFL  Shoulder external rotation  [facility]  Elbow flexion    Elbow extension    Wrist flexion    Wrist extension    Wrist ulnar deviation    Wrist radial deviation    Wrist pronation    Wrist supination     (Blank rows = not tested)  UPPER EXTREMITY MMT:  MMT Right eval Left eval  Shoulder flexion 4+ 3+  Shoulder extension    Shoulder abduction 4 3+  Shoulder adduction    Shoulder extension    Shoulder internal rotation 4+ 4  Shoulder external rotation 4+ 4  Middle trapezius    Lower trapezius    Elbow flexion 5 5  Elbow extension 4+ 4+  Wrist flexion  5 5  Wrist extension    Wrist ulnar deviation    Wrist radial deviation    Wrist pronation    Wrist supination    Grip strength 52# 62.7#   (Blank rows = not tested)  CERVICAL SPECIAL TESTS:  Upper limb tension test (ULTT): Positive on LUE for concordant distal symptoms into elbow (median nerve)  TREATMENT DATE:   07/29/2024                                                                                                                               Subjective: Pt entered PT with c/o HA and neck pain.  Pt. States she is still recovering from walking around [employer] [place] on Sunday.  Pt. Has not returned to gym at this time.    Manual Therapy:  use of MH to back t/o tx. Session in prone.   Supine manual traction of cervical spine with suboccipital release Supine PROM of cervical spine (bilateral rotation and bilateral lateral flexion) Supine manual bilateral upper traps stretch, 3 x 30 seconds bilaterally Supine/ prone trigger point release technique to L/R UT and R cervical paraspinals STM to bilateral upper traps and cervical and thoracic paraspinals region with use of Hypervolt in prone position  No There.ex (focus on manual tx./ pain mgmt).   PATIENT EDUCATION:  Education details: Pt educated on POC, HEP, diagnosis, prognosis, and anatomy involved Person educated: Patient Education method: Explanation, Demonstration, Tactile cues, Verbal  cues, and Handouts Education comprehension: verbalized understanding and returned demonstration  HOME EXERCISE PROGRAM: Access Code: [email or web address] URL: [email or web address] Date: 07/02/2024 Prepared by: Ozell Sero Exercises - Seated Upper Trapezius Stretch - 1 x daily - 4-5 x weekly - 3 sets - 3 reps - 20-30 sec. hold - Seated Passive Cervical Retraction - 1 x daily - 4-5 x weekly - 2 sets - 10 reps - Seated Shoulder Row with Anchored Resistance - 1 x daily - 4-5 x weekly - 2 sets - 15 reps   ASSESSMENT:  CLINICAL IMPRESSION: Pt presents to physical therapy with pain in cervical spine with reports of HA.  Pt continues to find relief with STM to bilateral upper traps, cervical paraspinals, and thoracic paraspinals with use of Hypervolt in prone position. Pt. Presents with several trigger points in B UT musculature and R cervical paraspinals.  Pt reported no increase in cervical neck pain or headaches at end of tx session along with a reduction in LUE symptoms. Will continue to monitor pt symptoms and progress to tolerance.    OBJECTIVE IMPAIRMENTS: decreased endurance, decreased mobility, decreased ROM, decreased strength, impaired UE functional use, postural dysfunction, and pain.   ACTIVITY LIMITATIONS: carrying, lifting, and bed mobility  PARTICIPATION LIMITATIONS: cleaning, laundry, and community activity  PERSONAL FACTORS: Fitness, Past/current experiences, Time since onset of injury/illness/exacerbation, and 1 comorbidity: hx of DVT are also affecting patient's functional outcome.   REHAB POTENTIAL: Good  CLINICAL DECISION MAKING: Evolving/moderate complexity  EVALUATION  COMPLEXITY: Low   GOALS: Goals reviewed with patient? Yes  SHORT TERM GOALS: Target date: 07/23/2024  Pt. will not experience any radicular numbness and tingling sensations below the bilateral elbow regions so that she can carry household objects with less pain and with greater ease.  Baseline:  bilateral N/T into fingers (L>R) Goal status: Not met  2.  Pt. will become independent with HEP to increase gross bilateral UE muscle strength to at least a 4/5 muscle grade to improve mobility/strength to make reaching and carrying tasks at home easier to complete.  Baseline: see above Goal status: Partially met   LONG TERM GOALS: Target date: 08/13/2024  Pt. will score an 20% or less on Neck Disability Index questionnaire so that she experiences a self perceived change in how her neck pain affects her daily activities.  Baseline: 32% Goal status: INITIAL  2.  Pt. will experience no more than 4/10 cervical neck pain at it's worst so that she is able to increase sleep quality without waking up in pain.  Baseline: 10/10 at its worst with pain in middle of night,  Goal status: INITIAL  3.  Pt. will increase cervical left lateral flexion AROM to at least 40 deg. to minimize risk for muscular stiffness in upper traps region.  Baseline: 35 deg Goal status: INITIAL  PLAN:  PT FREQUENCY: 1x/week  PT DURATION: 6 weeks  PLANNED INTERVENTIONS: 97164- PT Re-evaluation, 97110-Therapeutic exercises, 97530- Therapeutic activity, 97112- Neuromuscular re-education, 97535- Self Care, [id number]- Manual therapy, Patient/Family education, Joint mobilization, Joint manipulation, Spinal manipulation, Spinal mobilization, Cryotherapy, and Moist heat  PLAN FOR NEXT SESSION:  Discuss gym based ex./ reassess cervical and sh. ROM  Ozell JAYSON Sero, PT, DPT # [phone] 07/29/2024, 7:47 PM

## 2024-07-30 ENCOUNTER — Ambulatory Visit: Admitting: Physical Therapy

## 2024-08-05 ENCOUNTER — Ambulatory Visit: Admitting: Physical Therapy

## 2024-08-08 ENCOUNTER — Encounter: Payer: Self-pay | Admitting: Physical Therapy

## 2024-08-08 ENCOUNTER — Ambulatory Visit: Attending: Nurse Practitioner | Admitting: Physical Therapy

## 2024-08-08 DIAGNOSIS — M25612 Stiffness of left shoulder, not elsewhere classified: Secondary | ICD-10-CM | POA: Diagnosis present

## 2024-08-08 DIAGNOSIS — G8929 Other chronic pain: Secondary | ICD-10-CM | POA: Insufficient documentation

## 2024-08-08 DIAGNOSIS — M542 Cervicalgia: Secondary | ICD-10-CM | POA: Diagnosis present

## 2024-08-08 DIAGNOSIS — M25512 Pain in left shoulder: Secondary | ICD-10-CM | POA: Insufficient documentation

## 2024-08-08 DIAGNOSIS — M6281 Muscle weakness (generalized): Secondary | ICD-10-CM | POA: Diagnosis present

## 2024-08-08 DIAGNOSIS — M792 Neuralgia and neuritis, unspecified: Secondary | ICD-10-CM | POA: Diagnosis present

## 2024-08-08 NOTE — Therapy (Signed)
 OUTPATIENT PHYSICAL THERAPY CERVICAL TREATMENT  Patient Name: Kelsey Bell MRN: [id number] DOB:1980/11/29, 43 y.o., female Today's Date: 08/08/2024  END OF SESSION:  PT End of Session - 08/08/24 1032     Visit Number 6    Number of Visits 12    Date for Recertification  08/13/24    PT Start Time 1028    Activity Tolerance Patient tolerated treatment well;Patient limited by pain;Patient limited by fatigue    Behavior During Therapy [facility] for tasks assessed/performed         1028 to 1114  (46 minutes).    Past Medical History:  Diagnosis Date   Allergy    Anxiety    Arthritis    Asthma    Breast pain present for several months   Bil LT >RT across of breasts   CRPS (complex regional pain syndrome type I)    Depression    DVT (deep venous thrombosis) (HCC) 06/2018   Fibromyalgia    Kidney stones    LGSIL on Pap smear of cervix 2007   Migraine    Neuromuscular disorder (HCC)    Complex regional pain syndrome and Fibromyalgia   Pancreatic pseudocyst    Pancreatitis    Past Surgical History:  Procedure Laterality Date   COLPOSCOPY  2017   neg bx   ERCP     EXTRACORPOREAL SHOCK WAVE LITHOTRIPSY Right 04/26/2017   Procedure: EXTRACORPOREAL SHOCK WAVE LITHOTRIPSY (ESWL);  Surgeon: Penne Knee, MD;  Location: [facility] ORS;  Service: Urology;  Laterality: Right;   GASTROSTOMY-JEJEUNOSTOMY TUBE CHANGE/PLACEMENT     KNEE ARTHROSCOPY Right    nj placement     TONSILLECTOMY     UPPER ESOPHAGEAL ENDOSCOPIC ULTRASOUND (EUS)     UPPER GASTROINTESTINAL ENDOSCOPY     Patient Active Problem List   Diagnosis Date Noted   Orthostatic syncope    Dehydration 06/09/2020   Genetic testing 10/24/2018   Idiopathic acute pancreatitis 07/22/2018   DVT (deep venous thrombosis) (HCC) 06/04/2018   Acute pancreatitis 05/27/2018   Right knee pain 05/19/2018   Peripheral edema 03/05/2018   Neuropathic pain 11/02/2017   Kidney stone 04/25/2017   Ureteral stone    Migraines 12/15/2016    Morbid obesity with BMI of 45.0-49.9, adult (HCC) 10/20/2015   Asthma, moderate 05/21/2015   Polyarthralgia 12/01/2011   Fibromyalgia 09/18/2011   Low back pain 09/18/2011    PCP: Teresa Almarie Nam, NP  REFERRING PROVIDER: Shawnie Rickard LABOR, NP  REFERRING DIAG:  M25.50 (ICD-10-CM) - Pain in unspecified joint  M54.2 (ICD-10-CM) - Cervicalgia    THERAPY DIAG:  Muscle weakness (generalized)  Cervical pain (neck)  Radicular pain of left upper extremity  Radicular pain of right upper extremity  Rationale for Evaluation and Treatment: Rehabilitation  ONSET DATE: Chronic for at least > 10 years  SUBJECTIVE:  SUBJECTIVE STATEMENT: Pt is a 43 year old female who presents to physical therapy with chronic, 5/10 dull, stiff cervical neck pain which can reach a 10/10 at its worst. Pain can also cause headaches or patients TMJ to flair up with headaches being much more commonly occurring recently. Pt also states that she experiences bilateral upper trap tightness/pain with R>L and distal numbness and tingling sensations into bilateral extremities with L>R. Patient would like to decrease stiffness in neck region so that she may complete household tasks and lifting heavier objects such as her dog's food bags without significant pain.  Hand dominance: Left  PERTINENT HISTORY:  Patient has previously attempted to resolve chronic neck pain with physical therapy, dry needling, acupuncture, chiropractor, and use of Tylenol  and muscle relaxers. Pt has no previous surgical history in cervical spine.   PAIN:  Are you having pain? Yes: NPRS scale: 5/10 at rest in cervical spine, 10/10 at it's worst Pain location: Cervical spine, bilateral upper traps region (R>L), distal symptoms into bilateral UEs  (L>R) Pain description: Stiff, achy, constant Aggravating factors: stress, direct pressure to the area such as laying down on her back Relieving factors: muscle relaxers, rest, Biofreeze, Tylenol   PRECAUTIONS: None  RED FLAGS: Cervical pain that wakes patient up in middle of the night   WEIGHT BEARING RESTRICTIONS: No  FALLS:  Has patient fallen in last 6 months? No  LIVING ENVIRONMENT: Lives with: lives alone Lives in: House/apartment Stairs: No Has following equipment at home: None  OCCUPATION: Patient currently on disability  PLOF: Independent  PATIENT GOALS: Patient would like to decrease stiffness in neck region and improve overall UE strength so that she may complete household tasks and lifting heavier objects such as her dog's food bags without significant pain.  NEXT MD VISIT: None currently scheduled with referring provider  OBJECTIVE:  Note: Objective measures were completed at Evaluation unless otherwise noted.  DIAGNOSTIC FINDINGS:  N/A  PATIENT SURVEYS:  NDI: 32% self-perceived moderate disability  COGNITION: Overall cognitive status: Within functional limits for tasks assessed  SENSATION: Light touch: WFL  POSTURE: rounded shoulders, forward head, and increased thoracic kyphosis  PALPATION: Tender and mild pain upon palpation to suboccipital musculature and bilateral upper traps regions (L>R)   CERVICAL ROM:   Active ROM AROM (deg) eval  Flexion 44 deg  Extension 22 deg  Right lateral flexion 35 deg  Left lateral flexion 40 deg  Right rotation WFL  Left rotation WFL   (Blank rows = not tested)  UPPER EXTREMITY ROM:  Active ROM Right eval Left eval  Shoulder flexion WNL  120 deg  Shoulder extension    Shoulder abduction WNL 115 deg  Shoulder adduction    Shoulder extension    Shoulder internal rotation [facility] WFL  Shoulder external rotation [facility]  Elbow flexion    Elbow extension    Wrist flexion    Wrist extension    Wrist  ulnar deviation    Wrist radial deviation    Wrist pronation    Wrist supination     (Blank rows = not tested)  UPPER EXTREMITY MMT:  MMT Right eval Left eval  Shoulder flexion 4+ 3+  Shoulder extension    Shoulder abduction 4 3+  Shoulder adduction    Shoulder extension    Shoulder internal rotation 4+ 4  Shoulder external rotation 4+ 4  Middle trapezius    Lower trapezius    Elbow flexion 5 5  Elbow extension 4+ 4+  Wrist flexion  5 5  Wrist extension    Wrist ulnar deviation    Wrist radial deviation    Wrist pronation    Wrist supination    Grip strength 52# 62.7#   (Blank rows = not tested)  CERVICAL SPECIAL TESTS:  Upper limb tension test (ULTT): Positive on LUE for concordant distal symptoms into elbow (median nerve)  TREATMENT DATE: 08/08/2024                                                                                                                                Subjective: Pt entered PT with c/o HA and neck pain.  Pt. Reports stiffness in neck and 6/10 pain currently at rest.    Seated R/L shoulder flexion/ abduction AROM reassessment (added OP).  Seated scapular retraction and cuing to decrease UT compensation/ forward head/posture.    Manual Therapy:  use of MH to back t/o tx. Session in prone.    Supine manual traction of cervical spine with suboccipital release Supine PROM of cervical spine (bilateral rotation and bilateral lateral flexion) Supine manual bilateral upper traps stretch, 3 x 30 seconds bilaterally Supine/ prone trigger point release technique to L/R UT and R cervical paraspinals STM to bilateral upper traps and cervical and thoracic paraspinals region with use of Hypervolt in prone position  No There.ex (focus on manual tx./ pain mgmt).   PATIENT EDUCATION:  Education details: Pt educated on POC, HEP, diagnosis, prognosis, and anatomy involved Person educated: Patient Education method: Explanation, Demonstration, Tactile cues,  Verbal cues, and Handouts Education comprehension: verbalized understanding and returned demonstration  HOME EXERCISE PROGRAM: Access Code: [email or web address] URL: [email or web address] Date: 07/02/2024 Prepared by: Ozell Sero Exercises - Seated Upper Trapezius Stretch - 1 x daily - 4-5 x weekly - 3 sets - 3 reps - 20-30 sec. hold - Seated Passive Cervical Retraction - 1 x daily - 4-5 x weekly - 2 sets - 10 reps - Seated Shoulder Row with Anchored Resistance - 1 x daily - 4-5 x weekly - 2 sets - 15 reps   ASSESSMENT:  CLINICAL IMPRESSION: Pt presents to physical therapy with pain in cervical spine with reports of HA.  Pt continues to find relief with STM to bilateral upper traps, cervical paraspinals, and thoracic paraspinals with use of Hypervolt in prone position. Pt. Presents with several trigger points in B UT musculature and R cervical paraspinals.  Pt reported no increase in cervical neck pain or headaches at end of tx session along with a reduction in LUE symptoms. Will continue to monitor pt symptoms and progress to tolerance.    OBJECTIVE IMPAIRMENTS: decreased endurance, decreased mobility, decreased ROM, decreased strength, impaired UE functional use, postural dysfunction, and pain.   ACTIVITY LIMITATIONS: carrying, lifting, and bed mobility  PARTICIPATION LIMITATIONS: cleaning, laundry, and community activity  PERSONAL FACTORS: Fitness, Past/current experiences, Time since onset of injury/illness/exacerbation, and 1 comorbidity: hx of DVT are also affecting patient's functional outcome.  REHAB POTENTIAL: Good  CLINICAL DECISION MAKING: Evolving/moderate complexity  EVALUATION COMPLEXITY: Low   GOALS: Goals reviewed with patient? Yes  SHORT TERM GOALS: Target date: 07/23/2024  Pt. will not experience any radicular numbness and tingling sensations below the bilateral elbow regions so that she can carry household objects with less pain and with greater ease.   Baseline: bilateral N/T into fingers (L>R) Goal status: Not met  2.  Pt. will become independent with HEP to increase gross bilateral UE muscle strength to at least a 4/5 muscle grade to improve mobility/strength to make reaching and carrying tasks at home easier to complete.  Baseline: see above Goal status: Partially met   LONG TERM GOALS: Target date: 08/13/2024  Pt. will score an 20% or less on Neck Disability Index questionnaire so that she experiences a self perceived change in how her neck pain affects her daily activities.  Baseline: 32% Goal status: INITIAL  2.  Pt. will experience no more than 4/10 cervical neck pain at it's worst so that she is able to increase sleep quality without waking up in pain.  Baseline: 10/10 at its worst with pain in middle of night,  Goal status: INITIAL  3.  Pt. will increase cervical left lateral flexion AROM to at least 40 deg. to minimize risk for muscular stiffness in upper traps region.  Baseline: 35 deg Goal status: INITIAL  PLAN:  PT FREQUENCY: 1x/week  PT DURATION: 6 weeks  PLANNED INTERVENTIONS: 97164- PT Re-evaluation, 97110-Therapeutic exercises, 97530- Therapeutic activity, 97112- Neuromuscular re-education, 97535- Self Care, [id number]- Manual therapy, Patient/Family education, Joint mobilization, Joint manipulation, Spinal manipulation, Spinal mobilization, Cryotherapy, and Moist heat  PLAN FOR NEXT SESSION:  Discuss gym based ex./ CHECK GOALS  Ozell JAYSON Sero, PT, DPT # [phone] 08/08/2024, 10:32 AM

## 2024-08-11 ENCOUNTER — Ambulatory Visit: Admitting: Physical Therapy

## 2024-08-11 ENCOUNTER — Encounter: Payer: Self-pay | Admitting: Physical Therapy

## 2024-08-11 DIAGNOSIS — M6281 Muscle weakness (generalized): Secondary | ICD-10-CM

## 2024-08-11 DIAGNOSIS — M542 Cervicalgia: Secondary | ICD-10-CM

## 2024-08-11 DIAGNOSIS — M792 Neuralgia and neuritis, unspecified: Secondary | ICD-10-CM

## 2024-08-11 NOTE — Therapy (Signed)
 OUTPATIENT PHYSICAL THERAPY CERVICAL TREATMENT  Patient Name: Kelsey Bell MRN: [id number] DOB:06-Feb-1981, 43 y.o., female Today's Date: 08/11/2024  END OF SESSION:  PT End of Session - 08/11/24 0731     Visit Number 7    Number of Visits 12    Date for Recertification  08/13/24    PT Start Time 0731    PT Stop Time 0822    PT Time Calculation (min) 51 min    Activity Tolerance Patient tolerated treatment well;Patient limited by pain    Behavior During Therapy [facility] for tasks assessed/performed         Past Medical History:  Diagnosis Date   Allergy    Anxiety    Arthritis    Asthma    Breast pain present for several months   Bil LT >RT across of breasts   CRPS (complex regional pain syndrome type I)    Depression    DVT (deep venous thrombosis) (HCC) 06/2018   Fibromyalgia    Kidney stones    LGSIL on Pap smear of cervix 2007   Migraine    Neuromuscular disorder (HCC)    Complex regional pain syndrome and Fibromyalgia   Pancreatic pseudocyst    Pancreatitis    Past Surgical History:  Procedure Laterality Date   COLPOSCOPY  2017   neg bx   ERCP     EXTRACORPOREAL SHOCK WAVE LITHOTRIPSY Right 04/26/2017   Procedure: EXTRACORPOREAL SHOCK WAVE LITHOTRIPSY (ESWL);  Surgeon: Penne Knee, MD;  Location: [facility] ORS;  Service: Urology;  Laterality: Right;   GASTROSTOMY-JEJEUNOSTOMY TUBE CHANGE/PLACEMENT     KNEE ARTHROSCOPY Right    nj placement     TONSILLECTOMY     UPPER ESOPHAGEAL ENDOSCOPIC ULTRASOUND (EUS)     UPPER GASTROINTESTINAL ENDOSCOPY     Patient Active Problem List   Diagnosis Date Noted   Orthostatic syncope    Dehydration 06/09/2020   Genetic testing 10/24/2018   Idiopathic acute pancreatitis 07/22/2018   DVT (deep venous thrombosis) (HCC) 06/04/2018   Acute pancreatitis 05/27/2018   Right knee pain 05/19/2018   Peripheral edema 03/05/2018   Neuropathic pain 11/02/2017   Kidney stone 04/25/2017   Ureteral stone    Migraines 12/15/2016    Morbid obesity with BMI of 45.0-49.9, adult (HCC) 10/20/2015   Asthma, moderate 05/21/2015   Polyarthralgia 12/01/2011   Fibromyalgia 09/18/2011   Low back pain 09/18/2011   PCP: Teresa Almarie Nam, NP  REFERRING PROVIDER: Shawnie Rickard LABOR, NP  REFERRING DIAG:  M25.50 (ICD-10-CM) - Pain in unspecified joint  M54.2 (ICD-10-CM) - Cervicalgia   THERAPY DIAG:  Muscle weakness (generalized)  Cervical pain (neck)  Radicular pain of left upper extremity  Radicular pain of right upper extremity  Rationale for Evaluation and Treatment: Rehabilitation  ONSET DATE: Chronic for at least > 10 years  SUBJECTIVE:  SUBJECTIVE STATEMENT: Pt is a 43 year old female who presents to physical therapy with chronic, 5/10 dull, stiff cervical neck pain which can reach a 10/10 at its worst. Pain can also cause headaches or patients TMJ to flair up with headaches being much more commonly occurring recently. Pt also states that she experiences bilateral upper trap tightness/pain with R>L and distal numbness and tingling sensations into bilateral extremities with L>R. Patient would like to decrease stiffness in neck region so that she may complete household tasks and lifting heavier objects such as her dog's food bags without significant pain.  Hand dominance: Left  PERTINENT HISTORY:  Patient has previously attempted to resolve chronic neck pain with physical therapy, dry needling, acupuncture, chiropractor, and use of Tylenol  and muscle relaxers. Pt has no previous surgical history in cervical spine.   PAIN:  Are you having pain? Yes: NPRS scale: 5/10 at rest in cervical spine, 10/10 at it's worst Pain location: Cervical spine, bilateral upper traps region (R>L), distal symptoms into bilateral UEs  (L>R) Pain description: Stiff, achy, constant Aggravating factors: stress, direct pressure to the area such as laying down on her back Relieving factors: muscle relaxers, rest, Biofreeze, Tylenol   PRECAUTIONS: None  RED FLAGS: Cervical pain that wakes patient up in middle of the night   WEIGHT BEARING RESTRICTIONS: No  FALLS:  Has patient fallen in last 6 months? No  LIVING ENVIRONMENT: Lives with: lives alone Lives in: House/apartment Stairs: No Has following equipment at home: None  OCCUPATION: Patient currently on disability  PLOF: Independent  PATIENT GOALS: Patient would like to decrease stiffness in neck region and improve overall UE strength so that she may complete household tasks and lifting heavier objects such as her dog's food bags without significant pain.  NEXT MD VISIT: None currently scheduled with referring provider  OBJECTIVE:  Note: Objective measures were completed at Evaluation unless otherwise noted.  DIAGNOSTIC FINDINGS:  N/A  PATIENT SURVEYS:  NDI: 32% self-perceived moderate disability  COGNITION: Overall cognitive status: Within functional limits for tasks assessed  SENSATION: Light touch: WFL  POSTURE: rounded shoulders, forward head, and increased thoracic kyphosis  PALPATION: Tender and mild pain upon palpation to suboccipital musculature and bilateral upper traps regions (L>R)   CERVICAL ROM:   Active ROM AROM (deg) eval  Flexion 44 deg  Extension 22 deg  Right lateral flexion 35 deg  Left lateral flexion 40 deg  Right rotation WFL  Left rotation WFL   (Blank rows = not tested)  UPPER EXTREMITY ROM:  Active ROM Right eval Left eval  Shoulder flexion WNL  120 deg  Shoulder extension    Shoulder abduction WNL 115 deg  Shoulder adduction    Shoulder extension    Shoulder internal rotation [facility] WFL  Shoulder external rotation [facility]  Elbow flexion    Elbow extension    Wrist flexion    Wrist extension    Wrist  ulnar deviation    Wrist radial deviation    Wrist pronation    Wrist supination     (Blank rows = not tested)  UPPER EXTREMITY MMT:  MMT Right eval Left eval  Shoulder flexion 4+ 3+  Shoulder extension    Shoulder abduction 4 3+  Shoulder adduction    Shoulder extension    Shoulder internal rotation 4+ 4  Shoulder external rotation 4+ 4  Middle trapezius    Lower trapezius    Elbow flexion 5 5  Elbow extension 4+ 4+  Wrist flexion  5 5  Wrist extension    Wrist ulnar deviation    Wrist radial deviation    Wrist pronation    Wrist supination    Grip strength 52# 62.7#   (Blank rows = not tested)  CERVICAL SPECIAL TESTS:  Upper limb tension test (ULTT): Positive on LUE for concordant distal symptoms into elbow (median nerve)  TREATMENT DATE: 08/11/2024                                                                                                                                Subjective: Pt entered PT with c/o HA and neck pain.  Pt. Reports continued stiffness in neck this morning.    NDI: 32% (no changes)- neck stiffness/ persistent HA  Manual Therapy:     Seated posture (MH to neck) prior to tx.  Completed NDI (no change) Supine AA/PROM of cervical spine (bilateral rotation and bilateral lateral flexion).  Manual traction of cervical spine with suboccipital release/ holds feels good Supine manual bilateral upper traps stretch, 3 x 30 seconds bilaterally Supine L/R shoulder flexion/ horizontal abduction/ pec stretches with static holds. Supine/ prone trigger point release technique to L/R UT.  Use of MH to back t/o tx. Session in prone.  STM to bilateral upper traps and cervical and thoracic paraspinals region   No There.ex (focus on manual tx./ pain mgmt).   PATIENT EDUCATION:  Education details: Pt educated on POC, HEP, diagnosis, prognosis, and anatomy involved Person educated: Patient Education method: Explanation, Demonstration, Tactile cues, Verbal  cues, and Handouts Education comprehension: verbalized understanding and returned demonstration  HOME EXERCISE PROGRAM: Access Code: [email or web address] URL: [email or web address] Date: 07/02/2024 Prepared by: Ozell Sero Exercises - Seated Upper Trapezius Stretch - 1 x daily - 4-5 x weekly - 3 sets - 3 reps - 20-30 sec. hold - Seated Passive Cervical Retraction - 1 x daily - 4-5 x weekly - 2 sets - 10 reps - Seated Shoulder Row with Anchored Resistance - 1 x daily - 4-5 x weekly - 2 sets - 15 reps   ASSESSMENT:  CLINICAL IMPRESSION: Pt presents to physical therapy with continued pain in cervical spine with reports of HA.  Pt. C/o generalized cervical ROM stiffness with rotn./ lateral flexion.  Pt continues to find relief with STM to bilateral upper traps, cervical paraspinals, and thoracic paraspinals with manual tx./ trigger point release techniques.  Pt. Presents with a trigger point deep in L UT musculature.  (+) tenderness with STM/ trigger point release technique.   Pt reported no increase in cervical neck pain or headaches at end of tx session along with no UE radicular symptoms.  Will continue to monitor pt symptoms and progress to tolerance.    OBJECTIVE IMPAIRMENTS: decreased endurance, decreased mobility, decreased ROM, decreased strength, impaired UE functional use, postural dysfunction, and pain.   ACTIVITY LIMITATIONS: carrying, lifting, and bed mobility  PARTICIPATION LIMITATIONS: cleaning, laundry, and community activity  PERSONAL FACTORS: Fitness,  Past/current experiences, Time since onset of injury/illness/exacerbation, and 1 comorbidity: hx of DVT are also affecting patient's functional outcome.   REHAB POTENTIAL: Good  CLINICAL DECISION MAKING: Evolving/moderate complexity  EVALUATION COMPLEXITY: Low   GOALS: Goals reviewed with patient? Yes  SHORT TERM GOALS: Target date: 07/23/2024  Pt. will not experience any radicular numbness and tingling sensations below  the bilateral elbow regions so that she can carry household objects with less pain and with greater ease.  Baseline: bilateral N/T into fingers (L>R) Goal status: Not met  2.  Pt. will become independent with HEP to increase gross bilateral UE muscle strength to at least a 4/5 muscle grade to improve mobility/strength to make reaching and carrying tasks at home easier to complete.  Baseline: see above Goal status: Partially met   LONG TERM GOALS: Target date: 08/13/2024  Pt. will score an 20% or less on Neck Disability Index questionnaire so that she experiences a self perceived change in how her neck pain affects her daily activities.  Baseline: 32%.  12/8: 32% (no change) Goal status: Not met  2.  Pt. will experience no more than 4/10 cervical neck pain at it's worst so that she is able to increase sleep quality without waking up in pain.  Baseline: 10/10 at its worst with pain in middle of night,  Goal status: Not met  3.  Pt. will increase cervical left lateral flexion AROM to at least 40 deg. to minimize risk for muscular stiffness in upper traps region.  Baseline: 35 deg Goal status: INITIAL  PLAN:  PT FREQUENCY: 1x/week  PT DURATION: 6 weeks  PLANNED INTERVENTIONS: 97164- PT Re-evaluation, 97110-Therapeutic exercises, 97530- Therapeutic activity, 97112- Neuromuscular re-education, 97535- Self Care, [id number]- Manual therapy, Patient/Family education, Joint mobilization, Joint manipulation, Spinal manipulation, Spinal mobilization, Cryotherapy, and Moist heat  PLAN FOR NEXT SESSION:  Discuss gym based ex./ Re-measure cervical ROM  Ozell JAYSON Sero, PT, DPT # [id number] 08/11/2024, 8:25 AM

## 2024-08-12 ENCOUNTER — Ambulatory Visit: Admitting: Physical Therapy

## 2024-08-19 ENCOUNTER — Encounter: Admitting: Physical Therapy

## 2024-08-19 ENCOUNTER — Ambulatory Visit: Admitting: Physical Therapy

## 2024-08-21 ENCOUNTER — Ambulatory Visit: Admitting: Physical Therapy

## 2024-08-22 ENCOUNTER — Ambulatory Visit: Admitting: Physical Therapy

## 2024-08-22 DIAGNOSIS — M6281 Muscle weakness (generalized): Secondary | ICD-10-CM | POA: Diagnosis not present

## 2024-08-22 DIAGNOSIS — M792 Neuralgia and neuritis, unspecified: Secondary | ICD-10-CM

## 2024-08-22 DIAGNOSIS — M25612 Stiffness of left shoulder, not elsewhere classified: Secondary | ICD-10-CM

## 2024-08-22 DIAGNOSIS — G8929 Other chronic pain: Secondary | ICD-10-CM

## 2024-08-22 DIAGNOSIS — M542 Cervicalgia: Secondary | ICD-10-CM
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
## 2024-09-09 ENCOUNTER — Encounter: Admitting: Physical Therapy

## 2024-09-11 ENCOUNTER — Ambulatory Visit: Attending: Nurse Practitioner | Admitting: Physical Therapy

## 2024-09-11 DIAGNOSIS — M542 Cervicalgia: Secondary | ICD-10-CM | POA: Insufficient documentation

## 2024-09-11 DIAGNOSIS — G8929 Other chronic pain: Secondary | ICD-10-CM | POA: Diagnosis present

## 2024-09-11 DIAGNOSIS — M6281 Muscle weakness (generalized): Secondary | ICD-10-CM | POA: Diagnosis present

## 2024-09-11 DIAGNOSIS — M25512 Pain in left shoulder: Secondary | ICD-10-CM | POA: Diagnosis present

## 2024-09-11 DIAGNOSIS — M792 Neuralgia and neuritis, unspecified: Secondary | ICD-10-CM | POA: Insufficient documentation
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
## 2024-09-18 ENCOUNTER — Encounter: Payer: Self-pay | Admitting: Physical Therapy

## 2024-09-18 ENCOUNTER — Ambulatory Visit: Admitting: Physical Therapy

## 2024-09-18 DIAGNOSIS — M542 Cervicalgia: Secondary | ICD-10-CM

## 2024-09-18 DIAGNOSIS — M792 Neuralgia and neuritis, unspecified: Secondary | ICD-10-CM

## 2024-09-18 DIAGNOSIS — M6281 Muscle weakness (generalized): Secondary | ICD-10-CM | POA: Diagnosis not present
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
## 2024-09-24 ENCOUNTER — Ambulatory Visit: Admitting: Physical Therapy

## 2024-10-02 ENCOUNTER — Ambulatory Visit: Admitting: Physical Therapy

## 2024-10-02 DIAGNOSIS — M6281 Muscle weakness (generalized): Secondary | ICD-10-CM

## 2024-10-02 DIAGNOSIS — M542 Cervicalgia: Secondary | ICD-10-CM

## 2024-10-02 DIAGNOSIS — G8929 Other chronic pain: Secondary | ICD-10-CM

## 2024-10-02 DIAGNOSIS — M792 Neuralgia and neuritis, unspecified: Secondary | ICD-10-CM

## 2024-10-02 NOTE — Therapy (Signed)
 " OUTPATIENT PHYSICAL THERAPY CERVICAL TREATMENT  Patient Name: Kelsey Bell MRN: [id number] DOB:1981-03-09, 44 y.o., female Today's Date: 10/02/2024  END OF SESSION:  PT End of Session - 10/02/24 1700     Visit Number 11    Number of Visits 16    Date for Recertification  10/17/24    PT Start Time 1539    PT Stop Time 1650    PT Time Calculation (min) 71 min    Activity Tolerance Patient tolerated treatment well;Patient limited by pain    Behavior During Therapy [facility] for tasks assessed/performed         Past Medical History:  Diagnosis Date   Allergy    Anxiety    Arthritis    Asthma    Breast pain present for several months   Bil LT >RT across of breasts   CRPS (complex regional pain syndrome type I)    Depression    DVT (deep venous thrombosis) (HCC) 06/2018   Fibromyalgia    Kidney stones    LGSIL on Pap smear of cervix 2007   Migraine    Neuromuscular disorder (HCC)    Complex regional pain syndrome and Fibromyalgia   Pancreatic pseudocyst    Pancreatitis    Past Surgical History:  Procedure Laterality Date   COLPOSCOPY  2017   neg bx   ERCP     EXTRACORPOREAL SHOCK WAVE LITHOTRIPSY Right 04/26/2017   Procedure: EXTRACORPOREAL SHOCK WAVE LITHOTRIPSY (ESWL);  Surgeon: Penne Knee, MD;  Location: [facility] ORS;  Service: Urology;  Laterality: Right;   GASTROSTOMY-JEJEUNOSTOMY TUBE CHANGE/PLACEMENT     KNEE ARTHROSCOPY Right    nj placement     TONSILLECTOMY     UPPER ESOPHAGEAL ENDOSCOPIC ULTRASOUND (EUS)     UPPER GASTROINTESTINAL ENDOSCOPY     Patient Active Problem List   Diagnosis Date Noted   Orthostatic syncope    Dehydration 06/09/2020   Genetic testing 10/24/2018   Idiopathic acute pancreatitis 07/22/2018   DVT (deep venous thrombosis) (HCC) 06/04/2018   Acute pancreatitis 05/27/2018   Right knee pain 05/19/2018   Peripheral edema 03/05/2018   Neuropathic pain 11/02/2017   Kidney stone 04/25/2017   Ureteral stone    Migraines 12/15/2016    Morbid obesity with BMI of 45.0-49.9, adult (HCC) 10/20/2015   Asthma, moderate 05/21/2015   Polyarthralgia 12/01/2011   Fibromyalgia 09/18/2011   Low back pain 09/18/2011   PCP: Teresa Almarie Nam, NP  REFERRING PROVIDER: Shawnie Rickard LABOR, NP  REFERRING DIAG:  M25.50 (ICD-10-CM) - Pain in unspecified joint  M54.2 (ICD-10-CM) - Cervicalgia   THERAPY DIAG:  Muscle weakness (generalized)  Cervical pain (neck)  Radicular pain of left upper extremity  Radicular pain of right upper extremity  Chronic left shoulder pain  Rationale for Evaluation and Treatment: Rehabilitation  ONSET DATE: Chronic for at least > 10 years  SUBJECTIVE:  SUBJECTIVE STATEMENT: Pt is a 44 year old female who presents to physical therapy with chronic, 5/10 dull, stiff cervical neck pain which can reach a 10/10 at its worst. Pain can also cause headaches or patients TMJ to flair up with headaches being much more commonly occurring recently. Pt also states that she experiences bilateral upper trap tightness/pain with R>L and distal numbness and tingling sensations into bilateral extremities with L>R. Patient would like to decrease stiffness in neck region so that she may complete household tasks and lifting heavier objects such as her dog's food bags without significant pain.  Hand dominance: Left  PERTINENT HISTORY:  Patient has previously attempted to resolve chronic neck pain with physical therapy, dry needling, acupuncture, chiropractor, and use of Tylenol  and muscle relaxers. Pt has no previous surgical history in cervical spine.   PAIN:  Are you having pain? Yes: NPRS scale: 5/10 at rest in cervical spine, 10/10 at it's worst Pain location: Cervical spine, bilateral upper traps region (R>L), distal  symptoms into bilateral UEs (L>R) Pain description: Stiff, achy, constant Aggravating factors: stress, direct pressure to the area such as laying down on her back Relieving factors: muscle relaxers, rest, Biofreeze, Tylenol   PRECAUTIONS: None  RED FLAGS: Cervical pain that wakes patient up in middle of the night   WEIGHT BEARING RESTRICTIONS: No  FALLS:  Has patient fallen in last 6 months? No  LIVING ENVIRONMENT: Lives with: lives alone Lives in: House/apartment Stairs: No Has following equipment at home: None  OCCUPATION: Patient currently on disability  PLOF: Independent  PATIENT GOALS: Patient would like to decrease stiffness in neck region and improve overall UE strength so that she may complete household tasks and lifting heavier objects such as her dog's food bags without significant pain.  NEXT MD VISIT: None currently scheduled with referring provider  OBJECTIVE:  Note: Objective measures were completed at Evaluation unless otherwise noted.  DIAGNOSTIC FINDINGS:  N/A  PATIENT SURVEYS:  NDI: 32% self-perceived moderate disability  COGNITION: Overall cognitive status: Within functional limits for tasks assessed  SENSATION: Light touch: WFL  POSTURE: rounded shoulders, forward head, and increased thoracic kyphosis  PALPATION: Tender and mild pain upon palpation to suboccipital musculature and bilateral upper traps regions (L>R)   CERVICAL ROM:   Active ROM AROM (deg) eval  Flexion 44 deg  Extension 22 deg  Right lateral flexion 35 deg  Left lateral flexion 40 deg  Right rotation WFL  Left rotation WFL   (Blank rows = not tested)  UPPER EXTREMITY ROM:  Active ROM Right eval Left eval  Shoulder flexion WNL  120 deg  Shoulder extension    Shoulder abduction WNL 115 deg  Shoulder adduction    Shoulder extension    Shoulder internal rotation [facility] WFL  Shoulder external rotation [facility]  Elbow flexion    Elbow extension    Wrist flexion     Wrist extension    Wrist ulnar deviation    Wrist radial deviation    Wrist pronation    Wrist supination     (Blank rows = not tested)  UPPER EXTREMITY MMT:  MMT Right eval Left eval  Shoulder flexion 4+ 3+  Shoulder extension    Shoulder abduction 4 3+  Shoulder adduction    Shoulder extension    Shoulder internal rotation 4+ 4  Shoulder external rotation 4+ 4  Middle trapezius    Lower trapezius    Elbow flexion 5 5  Elbow extension 4+ 4+  Wrist flexion  5 5  Wrist extension    Wrist ulnar deviation    Wrist radial deviation    Wrist pronation    Wrist supination    Grip strength 52# 62.7#   (Blank rows = not tested)  CERVICAL SPECIAL TESTS:  Upper limb tension test (ULTT): Positive on LUE for concordant distal symptoms into elbow (median nerve)  NDI: 32% (no changes)- neck stiffness/ persistent HA  TREATMENT DATE: 10/02/2024                                                                                                                                Subjective:  Pt reports not feeling good today. Pt reports 6/10 pain in neck. Pt slipped yesterday on ice and fell on her back. She reports feeling stiff in her back, ribs, and neck. Pt reports being limited with driving and standing for <69 min at a time, and also only able to sit for a hour at a time due to increase c/o pain. Pt c/o stiffness in hips, knees, and ankles and has been attending Chiro appts. To manage symptoms.  Pt reports nerve glides have been helping nerve pain.         Manual Therapy:     MH pack applied to neck and low back throughout treatment today (seated/supine position).  Supine AA/PROM of cervical spine (bilateral rotation and bilateral lateral flexion)- static holds a tolerable range.  Supine manual bilateral upper trap/ levator stretches, 3 x 30 seconds bilaterally  Supine cervical manual traction 3x30 sec. holds  Supine sub-occipital release 3x30 sec. Holds  Supine cervical retraction  3x10. (In pain tolerable range)   Supine neck isometrics (Lateral side bending L/R) 4x10 sec holds.  Manual/verbal feedback.    Supine B arm stretches (Horizontal AB, Flex, Abduction) (horizontal sh. Abduction/ adduction) 2x30 sec   Supine LE generalized stretches (hamstring, hip flexion, and piriformis) 2x30sec  Assessment of ROM Cervical Rot R47, L60 deg Lateral side bending R35, L25 deg (pain limited) Shoulder abduction R95, L105 deg  No There.ex (focus on manual tx./ pain mgmt).   PATIENT EDUCATION:  Education details: Pt educated on POC, HEP, diagnosis, prognosis, and anatomy involved Person educated: Patient Education method: Explanation, Demonstration, Tactile cues, Verbal cues, and Handouts Education comprehension: verbalized understanding and returned demonstration  HOME EXERCISE PROGRAM: Access Code: [email or web address] URL: [email or web address] Date: 07/02/2024 Prepared by: Ozell Sero Exercises - Seated Upper Trapezius Stretch - 1 x daily - 4-5 x weekly - 3 sets - 3 reps - 20-30 sec. hold - Seated Passive Cervical Retraction - 1 x daily - 4-5 x weekly - 2 sets - 10 reps - Seated Shoulder Row with Anchored Resistance - 1 x daily - 4-5 x weekly - 2 sets - 15 reps   Access Code: [email or web address] URL: [email or web address] Date: 09/18/2024 Prepared by: Ozell Sero  Exercises - Median Nerve Flossing - Tray  - 1 x daily - 7 x weekly - 1 sets -  10 reps - Ulnar Nerve Flossing  - 1 x daily - 7 x weekly - 1 sets - 10 reps - Radial Nerve Flossing  - 1 x daily - 7 x weekly - 1 sets - 10 reps - Supine Chin Tuck  - 1 x daily - 7 x weekly - 2 sets - 10 reps - Supine Pectoralis Stretch  - 1 x daily - 7 x weekly - 3 sets - 30 hold  ASSESSMENT:  CLINICAL IMPRESSION: Pt presented with increased pain and stiffness in cervical spine as well as pain in lower back. Pt had increase pain secondary to fall on ice yesterday. Pt benefited form manual therapy stating that is  helped decrease pain. Pt benefited from MH pack on neck and lower back. Pt required assistance to get into supine due to pain in ribs/abdominal muscles. Pt was able to tolerate cervical retraction which did not increase pain. Pt also was able to tolerate cervical isometrics with no increase in pain. Pt will continue to show improvement with stretching and muscle release techniques, with an eventual progression into strengthening the cervical spine and postural musculature.  PT is hoping to progress pt. To a more independent gym based ex. Program at [employer].   OBJECTIVE IMPAIRMENTS: decreased endurance, decreased mobility, decreased ROM, decreased strength, impaired UE functional use, postural dysfunction, and pain.   ACTIVITY LIMITATIONS: carrying, lifting, and bed mobility  PARTICIPATION LIMITATIONS: cleaning, laundry, and community activity  PERSONAL FACTORS: Fitness, Past/current experiences, Time since onset of injury/illness/exacerbation, and 1 comorbidity: hx of DVT are also affecting patient's functional outcome.   REHAB POTENTIAL: Good  CLINICAL DECISION MAKING: Evolving/moderate complexity  EVALUATION COMPLEXITY: Low   GOALS: Goals reviewed with patient? Yes  LONG TERM GOALS: Target date: 10/17/2024  Pt. will not experience any radicular numbness and tingling sensations below the bilateral elbow regions so that she can carry household objects with less pain and with greater ease.  Baseline: bilateral N/T into fingers (L>R) Goal status: Not met  2.  Pt. will become independent with HEP to increase gross bilateral UE muscle strength to at least a 4/5 muscle grade to improve mobility/strength to make reaching and carrying tasks at home easier to complete.  Baseline: see above Goal status: Partially met  3.  Pt. will score an 20% or less on Neck Disability Index questionnaire so that she experiences a self perceived change in how her neck pain affects her daily activities.   Baseline: 32%.  12/8: 32% (no change) Goal status: Not met  4.  Pt. will experience no more than 4/10 cervical neck pain at it's worst so that she is able to increase sleep quality without waking up in pain.  Baseline: 10/10 at its worst with pain in middle of night,  Goal status: Not met  5.  Pt. will increase cervical left lateral flexion AROM to at least 40 deg. to minimize risk for muscular stiffness in upper traps region.  Baseline: 35 deg Goal status: On-going  PLAN:  PT FREQUENCY: 1x/week  PT DURATION: 8 weeks  PLANNED INTERVENTIONS: [id number]- PT Re-evaluation, 97110-Therapeutic exercises, 97530- Therapeutic activity, 97112- Neuromuscular re-education, 97535- Self Care, [id number]- Manual therapy, Patient/Family education, Joint mobilization, Joint manipulation, Spinal manipulation, Spinal mobilization, Cryotherapy, and Moist heat  PLAN FOR NEXT SESSION:  Complete NDI.    Rankin Gainer, SPT Ozell JAYSON Sero, PT, DPT # [phone] 10/02/2024, 5:24 PM "
